# Patient Record
Sex: Male | Born: 1945 | Race: White | Hispanic: No | Marital: Married | Smoking: Never smoker
Health system: Southern US, Community
[De-identification: ages and names within clinical notes are randomized; demographics above are authoritative.]

## PROBLEM LIST (undated history)

## (undated) DIAGNOSIS — R7881 Bacteremia: Secondary | ICD-10-CM

## (undated) DIAGNOSIS — C439 Malignant melanoma of skin, unspecified: Secondary | ICD-10-CM

## (undated) DIAGNOSIS — C4490 Unspecified malignant neoplasm of skin, unspecified: Secondary | ICD-10-CM

## (undated) DIAGNOSIS — N10 Acute tubulo-interstitial nephritis: Secondary | ICD-10-CM

## (undated) DIAGNOSIS — G4730 Sleep apnea, unspecified: Secondary | ICD-10-CM

## (undated) DIAGNOSIS — N200 Calculus of kidney: Secondary | ICD-10-CM

## (undated) DIAGNOSIS — I219 Acute myocardial infarction, unspecified: Secondary | ICD-10-CM

## (undated) DIAGNOSIS — I10 Essential (primary) hypertension: Secondary | ICD-10-CM

## (undated) DIAGNOSIS — S52021A Displaced fracture of olecranon process without intraarticular extension of right ulna, initial encounter for closed fracture: Secondary | ICD-10-CM

## (undated) DIAGNOSIS — I2510 Atherosclerotic heart disease of native coronary artery without angina pectoris: Secondary | ICD-10-CM

## (undated) DIAGNOSIS — S129XXA Fracture of neck, unspecified, initial encounter: Secondary | ICD-10-CM

## (undated) DIAGNOSIS — K118 Other diseases of salivary glands: Secondary | ICD-10-CM

## (undated) DIAGNOSIS — I480 Paroxysmal atrial fibrillation: Secondary | ICD-10-CM

## (undated) DIAGNOSIS — M1990 Unspecified osteoarthritis, unspecified site: Secondary | ICD-10-CM

## (undated) DIAGNOSIS — I214 Non-ST elevation (NSTEMI) myocardial infarction: Secondary | ICD-10-CM

## (undated) DIAGNOSIS — R569 Unspecified convulsions: Secondary | ICD-10-CM

## (undated) DIAGNOSIS — W19XXXA Unspecified fall, initial encounter: Secondary | ICD-10-CM

## (undated) HISTORY — PX: SKIN CANCER EXCISION: SHX779

## (undated) HISTORY — DX: Unspecified fall, initial encounter: W19.XXXA

## (undated) HISTORY — DX: Malignant melanoma of skin, unspecified: C43.9

## (undated) HISTORY — DX: Non-ST elevation (NSTEMI) myocardial infarction: I21.4

## (undated) HISTORY — DX: Atherosclerotic heart disease of native coronary artery without angina pectoris: I25.10

## (undated) HISTORY — PX: CORONARY ANGIOPLASTY WITH STENT PLACEMENT: SHX49

## (undated) HISTORY — DX: Paroxysmal atrial fibrillation: I48.0

---

## 1999-08-22 ENCOUNTER — Ambulatory Visit (HOSPITAL_COMMUNITY): Admission: RE | Admit: 1999-08-22 | Discharge: 1999-08-22 | Payer: Self-pay | Admitting: Cardiovascular Disease

## 2000-01-06 ENCOUNTER — Encounter: Payer: Self-pay | Admitting: Family Medicine

## 2000-01-06 ENCOUNTER — Ambulatory Visit (HOSPITAL_COMMUNITY): Admission: RE | Admit: 2000-01-06 | Discharge: 2000-01-06 | Payer: Self-pay | Admitting: Family Medicine

## 2000-01-06 ENCOUNTER — Encounter: Admission: RE | Admit: 2000-01-06 | Discharge: 2000-02-26 | Payer: Self-pay | Admitting: Family Medicine

## 2005-06-27 ENCOUNTER — Emergency Department (HOSPITAL_COMMUNITY): Admission: EM | Admit: 2005-06-27 | Discharge: 2005-06-27 | Payer: Self-pay | Admitting: Emergency Medicine

## 2005-07-15 ENCOUNTER — Ambulatory Visit (HOSPITAL_COMMUNITY): Admission: RE | Admit: 2005-07-15 | Discharge: 2005-07-15 | Payer: Self-pay | Admitting: Family Medicine

## 2005-11-10 ENCOUNTER — Encounter: Admission: RE | Admit: 2005-11-10 | Discharge: 2006-02-08 | Payer: Self-pay | Admitting: Family Medicine

## 2006-03-25 ENCOUNTER — Encounter: Admission: RE | Admit: 2006-03-25 | Discharge: 2006-03-25 | Payer: Self-pay | Admitting: Orthopedic Surgery

## 2006-08-25 ENCOUNTER — Encounter: Admission: RE | Admit: 2006-08-25 | Discharge: 2006-08-25 | Payer: Self-pay | Admitting: Family Medicine

## 2008-06-28 ENCOUNTER — Inpatient Hospital Stay (HOSPITAL_COMMUNITY): Admission: EM | Admit: 2008-06-28 | Discharge: 2008-06-29 | Payer: Self-pay | Admitting: Emergency Medicine

## 2008-09-06 ENCOUNTER — Ambulatory Visit (HOSPITAL_COMMUNITY): Admission: RE | Admit: 2008-09-06 | Discharge: 2008-09-06 | Payer: Self-pay | Admitting: Neurology

## 2008-10-12 ENCOUNTER — Emergency Department (HOSPITAL_COMMUNITY): Admission: EM | Admit: 2008-10-12 | Discharge: 2008-10-12 | Payer: Self-pay | Admitting: Emergency Medicine

## 2009-09-25 ENCOUNTER — Emergency Department (HOSPITAL_COMMUNITY): Admission: EM | Admit: 2009-09-25 | Discharge: 2009-09-25 | Payer: Self-pay | Admitting: Emergency Medicine

## 2010-11-23 LAB — URINALYSIS, ROUTINE W REFLEX MICROSCOPIC
Bilirubin Urine: NEGATIVE
Glucose, UA: NEGATIVE mg/dL
Hgb urine dipstick: NEGATIVE
Ketones, ur: NEGATIVE mg/dL
Protein, ur: NEGATIVE mg/dL

## 2010-11-23 LAB — DIFFERENTIAL
Basophils Relative: 1 % (ref 0–1)
Eosinophils Absolute: 0.2 10*3/uL (ref 0.0–0.7)
Eosinophils Relative: 3 % (ref 0–5)
Lymphs Abs: 1.1 10*3/uL (ref 0.7–4.0)
Monocytes Relative: 10 % (ref 3–12)

## 2010-11-23 LAB — BASIC METABOLIC PANEL
CO2: 30 mEq/L (ref 19–32)
Chloride: 106 mEq/L (ref 96–112)
Creatinine, Ser: 1.08 mg/dL (ref 0.4–1.5)
Glucose, Bld: 112 mg/dL — ABNORMAL HIGH (ref 70–99)
Sodium: 142 mEq/L (ref 135–145)

## 2010-11-23 LAB — CBC
HCT: 35.5 % — ABNORMAL LOW (ref 39.0–52.0)
MCHC: 35.1 g/dL (ref 30.0–36.0)
MCV: 92.3 fL (ref 78.0–100.0)
RBC: 3.85 MIL/uL — ABNORMAL LOW (ref 4.22–5.81)
WBC: 7.4 10*3/uL (ref 4.0–10.5)

## 2010-12-23 LAB — DIFFERENTIAL
Eosinophils Relative: 3 % (ref 0–5)
Lymphocytes Relative: 16 % (ref 12–46)
Lymphs Abs: 1.1 10*3/uL (ref 0.7–4.0)
Neutrophils Relative %: 73 % (ref 43–77)

## 2010-12-23 LAB — CBC
MCHC: 34.7 g/dL (ref 30.0–36.0)
MCV: 90.2 fL (ref 78.0–100.0)
RBC: 4.32 MIL/uL (ref 4.22–5.81)
RDW: 14 % (ref 11.5–15.5)

## 2010-12-23 LAB — COMPREHENSIVE METABOLIC PANEL
ALT: 16 U/L (ref 0–53)
Calcium: 8.9 mg/dL (ref 8.4–10.5)
Glucose, Bld: 115 mg/dL — ABNORMAL HIGH (ref 70–99)
Sodium: 140 mEq/L (ref 135–145)
Total Protein: 6.3 g/dL (ref 6.0–8.3)

## 2010-12-23 LAB — RAPID URINE DRUG SCREEN, HOSP PERFORMED
Amphetamines: NOT DETECTED
Benzodiazepines: NOT DETECTED
Cocaine: NOT DETECTED
Opiates: NOT DETECTED
Tetrahydrocannabinol: NOT DETECTED

## 2011-01-20 NOTE — Discharge Summary (Signed)
Mark [name], Mark [name]                ACCOUNT NO.:  [id number]   MEDICAL RECORD NO.:  [id number]          PATIENT TYPE:  INP   LOCATION:  A321                          FACILITY:  [facility]   PHYSICIAN:  Osvaldo Shipper, MD     DATE OF BIRTH:  [birth year]   DATE OF ADMISSION:  06/27/2008  DATE OF DISCHARGE:  10/23/2009LH                               DISCHARGE SUMMARY   PMD:  Dr. Renaye Rakers in [place], [place].   DISCHARGE DIAGNOSES:  1. Acute gouty inflammation of the left foot, improved.  2. History of hypertension.  3. History of dyslipidemia.  4. Possible superimposed cellulitis requiring a brief course of      antibiotics.  5. Chronic gait impairment, requiring outpatient neurologic      evaluation, defer to primary medical doctor.   BRIEF HOSPITAL COURSE:  Briefly, this is a 65 year old Caucasian male  who presented with complaints of pain in his left foot area with redness  and swelling.  The patient has a history of gout, and this was thought  to be a gouty inflammation; however, he also had an elevated white count  and low grade fever, so consideration was also given to superimposed  cellulitis.  Patient was started on Unasyn, colchicine, and patient is  significantly improved.  His white count is down to normal.  He is  afebrile.  He is feeling better.  His inflammation is subsided.  The  redness is decreased significantly.  He was also seen by physical  therapy, and he was able to ambulate with a walker.  He does have a  walker at home, and home-health PT will be set up for him.   I have asked him to discuss with Dr. Parke Simmers regarding initiation of  allopurinol, as this is his third episode of gout this year.  He may  also be a candidate for prophylactic dose of colchicine, but again, this  would be deferred to Dr. Parke Simmers.   His other medical issues remain stable.  The patient and his wife were  mentioning that patient was having difficulty with his gait for the last  six months or so.  A CT head was done which showed prominent ventricles  but no overt hydrocephalus.  He does mention some tremors occasionally.  It is likely the patient could have Parkinson's.  I have told him to  discuss this with Dr. Parke Simmers so that neurological referral may be made as  needed.  Ankle films were negative for any acute bony injury.   On the day of discharge, he is feeling better.  He was able to ambulate  yesterday with a walker.  Vital signs were all stable.  Examination  reveals his left foot is actually is actually much improved.  Redness  has decreased significantly.  Swelling is also down.   Overall, he is stable for discharge.   DISCHARGE MEDICATIONS:  1. Augmentin 875 one capsule twice daily for five more days.  2. Colchicine 0.6 mg 3 times daily for five days.  3. Motrin 400 mg p.o. t.i.d. p.r.n. pain.  4. Zantac  150 mg p.o. b.i.d. while on Motrin.  5. Benicar/HCT 40/25 once daily.  6. Lovaza 1 gm once daily.  7. Lipitor 10 mg once daily.  8. Norvasc 10 mg once daily.  9. Aspirin 81 mg once daily.   Follow up with Dr. Parke Simmers in one week.   Dr. Parke Simmers to decide regarding referral to neurology after discussing  with patient.   DIET:  Heart healthy.   PHYICAL ACTIVITY:  With a walker.  Home health PT will be set up.   No consultations obtained during this admission.   Total time on this discharge summary was about 35 minutes.      Osvaldo Shipper, MD  Electronically Signed     GK/MEDQ  D:  06/29/2008  T:  06/29/2008  Job:  [id number]   cc:   Renaye Rakers, M.D.  Fax: [phone]

## 2011-01-20 NOTE — H&P (Signed)
Mark [name], Mark [name]                ACCOUNT NO.:  [id number]   MEDICAL RECORD NO.:  [id number]          PATIENT TYPE:  INP   LOCATION:  IC05                          FACILITY:  [facility]   PHYSICIAN:  Osvaldo Shipper, MD     DATE OF BIRTH:  15-Nov-1945   DATE OF ADMISSION:  06/28/2008  DATE OF DISCHARGE:  LH                              HISTORY & PHYSICAL   PRIMARY CARE PHYSICIAN:  Renaye Rakers, M.D.   ADMITTING DIAGNOSES:  1. Acute gout inflammation of the left foot.  2. Possible superimposed cellulitis.  3. History of hypertension.  4. History of dyslipidemia.   CHIEF COMPLAINT:  Left foot pain and fall.   HISTORY OF PRESENT ILLNESS:  The patient is a 65 year old Caucasian male  who has a history of gout, hypertension and dyslipidemia; who was in his  usual state of health until this past Sunday, when he started noticing  pain in his left foot area.  The foot slowly became red.  He also  noticed pain in the left knee area.  Over the last day and a half the  patient has been difficulty walking.  He has fallen a couple of times.  He denies any syncopal episodes however.  He denies any trauma as a  result of the fall.  He mentioned a history of fever, but has not  checked his temperature.  He denies any sick contacts.  He tells me that  he has recovered from flu-like symptoms which he had last week.  He  denies any nausea or vomiting.  He says his gait has been severely  impaired because of the inflammation and the pain.  According to his  wife, the patient's gait has been impaired over the last 6 months, but  they have not seen a neurologist nor mentioned this to his regular  doctor.   MEDICATIONS AT HOME:  1. Benicar-HCT unknown dose.  2. Aspirin 81 mg daily.  3. Lovaza unknown dose.  4. Lipitor unknown dose.  5. Norvasc unknown dose.   ALLERGIES:  NO KNOWN DRUG ALLERGIES .   PAST MEDICAL HISTORY:  1. Hypertension.  2. Dyslipidemia.  3. He mentions history of irregular heart  beat, but he does not know      full details regarding this condition.   PAST SURGICAL HISTORY:  None.   SOCIAL HISTORY:  He lives in [place] with his wife.  He is retired.  He does not smoke.  No alcohol consumption.  No illicit drug use.  He  was independent before onset of this inflammation.   FAMILY HISTORY:  Unremarkable .   REVIEW OF SYSTEMS:  GENERAL:  Positive for weakness.  HEENT:  Unremarkable.  CARDIOVASCULAR:  Unremarkable.  RESPIRATORY:  Unremarkable.  GI:  Unremarkable.  GU:  Unremarkable.  MUSCULOSKELETAL:  As in HPI.  NEUROLOGIC:  Unremarkable.  PSYCHIATRIC:  Unremarkable.  DERMATOLOGIC:  Unremarkable.  Other systems unremarkable.   PHYSICAL EXAMINATION:  VITAL SIGNS:  When he was in the emergency  department he had a temperature of 99, his heart rate in the 80s, sed  rate  14, blood pressure 103/64, saturation 96% on room air.  GENERAL EXAM:  This is a white male who is obese, in no distress.  HEENT:  There is no pallor, no icterus.  Oral mucous membranes are  moist.  No old lesions are noted.  NECK:  Soft and supple, no thyromegaly was appreciated.  CARDIOVASCULAR:  S1 and S2 is normal and regular.  No murmurs  appreciated.  No S2, S3 or S4.  No rubs or bruits.  No JVP is noted.  LUNGS:  Clear to auscultation bilaterally.  No wheezing, rales or  rhonchi.  No dullness to percussion.  ABDOMEN:  Soft, nontender and nondistended.  Bowel sounds are present.  No masses or organomegaly was appreciated.  EXTREMITIES:  Do not show any edema.  NEUROLOGIC:  He is alert and oriented x3.  No focal neurological  deficits are present.  GU:  Examination did not reveal any acute abnormalities.  DERMATOLOGIC:  He does have erythema over his left foot area,  encompassing his ankle as well.  He has some spots also over his left  knee.  MUSCULOSKELETAL:  Range of motion is diminished in the left ankle.  He  has tenderness over the metatarsal phalangeal joint, which is also   inflamed and swollen.  Tophi are also noted.   Gait was not assessed at this time.   LAB DATA:  White count 7.8, hemoglobin 12.5, platelet count 267,  differential was actually normal.  BMET was unremarkable.  Uric acid  level is 8.1 (elevated).  UA showed small bilirubin, specific gravity  was more than 1.030.  Blood cultures have been sent.   No admitting studies have been done.   ASSESSMENT:  This is a 65 year old Caucasian male with a past medical  history of hypertension, dyslipidemia and gout; who presents with pain  and swelling in the left foot area, and has fallen down a few times.  He  has evidence for acute gout inflammation.  He also has elevated white  count and low-grade fever.  He could have superimposed cellulitis as  well, though that is less likely.   PLAN:  1. Acute Gout Inflammation:  The patient will be put on anti-      inflammatory agents.  He will be given Colchicine.  Uric acid is      elevated.  Consideration to getting x-rays of the left foot will be      given if the patient has significant problems with ambulation.  PT      evaluation has been ordered.  The patient will benefit from      allopurinol, as this apparently is the third or fourth episode this      year so far.  2. Possible Cellulitis:  We will put him on Unasyn for now.  We will      recheck his white counts.  Will follow up on his blood cultures.  3. Hypertension:  Continue with his Benicar-HCT and his Norvasc.  4. History of Dyslipidemia:  Continue Lipitor as well.  5. History of Falls:  Likely as a result of the acute inflammation.      His wife mentions gait impairment over the last 6 months.  We will      check a CT of the head to make sure there is no normal pressure      hydrocephalus, and then will proceed from there.  Most likely he      will need an outpatient neurological evaluation,  which can be      arranged by his primary care physician.   PROPHYLAXIS:  Deep vein thrombosis  will be given.   CODE STATUS:  He is Full Code.      Osvaldo Shipper, MD  Electronically Signed     GK/MEDQ  D:  06/28/2008  T:  06/28/2008  Job:  [id number]   cc:   Renaye Rakers, M.D.  Fax: [phone]

## 2011-01-23 NOTE — Procedures (Signed)
EEG NUMBER:  [id number]   HISTORY OF PRESENT ILLNESS:  Mark [name] is a 65 year old patient who has  episodes of seizure-like activity with arm raising on both sides and urinary  incontinence.  First episode two weeks prior to this testing procedure.  This is a routine EEG.  No skull defects are noted.   MEDICATIONS:  1.  Lexapro.  2.  Lyrica.  3.  Benicar.  4.  Famvir.   EEG CLASSIFICATION:  Normal awake.   DESCRIPTION OF RECORDING:  This recording consisted of a fairly well  modulated medium amplitude alpha rhythm of 9 Hertz that is reactive to eye  opening and closing.  As the record progresses, the patient appears to  remain awakened state during the entirety of the recording.  Photic  stimulation is performed resulting in a bilateral and symmetrical flow  driving response.  Hyperventilation was also performed resulting in a  minimal buildup of background rhythm activities without significant swelling  seen.  At not time during the recording does there appear to be evidence of  spike or spike wave discharges or focal slowing.  EKG monitor shows no  evidence of cardiac rhythm abnormalities with heart rate of 66.   IMPRESSION:  This is a normal EEG recording in the awakened state.  No  evidence of ictal or inner ictal discharges are seen.      Marlan Palau, M.D.  Electronically Signed     GNF:AOZH  D:  07/15/2005 19:04:43  T:  07/16/2005 07:51:53  Job #:  [id number]

## 2011-02-17 ENCOUNTER — Encounter (HOSPITAL_COMMUNITY): Payer: Self-pay | Admitting: Radiology

## 2011-02-17 ENCOUNTER — Emergency Department (HOSPITAL_COMMUNITY)
Admission: EM | Admit: 2011-02-17 | Discharge: 2011-02-17 | Disposition: A | Discharge status: Home / Self Care | Payer: Medicare PPO | Attending: Emergency Medicine | Admitting: Emergency Medicine

## 2011-02-17 ENCOUNTER — Emergency Department (HOSPITAL_COMMUNITY): Payer: Medicare PPO

## 2011-02-17 DIAGNOSIS — Z8673 Personal history of transient ischemic attack (TIA), and cerebral infarction without residual deficits: Secondary | ICD-10-CM | POA: Insufficient documentation

## 2011-02-17 DIAGNOSIS — G20A1 Parkinson's disease without dyskinesia, without mention of fluctuations: Secondary | ICD-10-CM | POA: Insufficient documentation

## 2011-02-17 DIAGNOSIS — G20 Parkinson's disease: Secondary | ICD-10-CM | POA: Insufficient documentation

## 2011-02-17 DIAGNOSIS — I2510 Atherosclerotic heart disease of native coronary artery without angina pectoris: Secondary | ICD-10-CM | POA: Insufficient documentation

## 2011-02-17 DIAGNOSIS — M549 Dorsalgia, unspecified: Secondary | ICD-10-CM | POA: Insufficient documentation

## 2011-02-17 DIAGNOSIS — I10 Essential (primary) hypertension: Secondary | ICD-10-CM | POA: Insufficient documentation

## 2011-02-17 DIAGNOSIS — G40802 Other epilepsy, not intractable, without status epilepticus: Secondary | ICD-10-CM | POA: Insufficient documentation

## 2011-02-17 DIAGNOSIS — M109 Gout, unspecified: Secondary | ICD-10-CM | POA: Insufficient documentation

## 2011-02-17 DIAGNOSIS — G8929 Other chronic pain: Secondary | ICD-10-CM | POA: Insufficient documentation

## 2011-02-17 DIAGNOSIS — Z79899 Other long term (current) drug therapy: Secondary | ICD-10-CM | POA: Insufficient documentation

## 2011-02-17 HISTORY — DX: Essential (primary) hypertension: I10

## 2011-02-17 LAB — COMPREHENSIVE METABOLIC PANEL
ALT: 11 U/L (ref 0–53)
AST: 16 U/L (ref 0–37)
Albumin: 3.8 g/dL (ref 3.5–5.2)
Alkaline Phosphatase: 67 U/L (ref 39–117)
CO2: 27 mEq/L (ref 19–32)
Creatinine, Ser: 1.1 mg/dL (ref 0.4–1.5)
Glucose, Bld: 107 mg/dL — ABNORMAL HIGH (ref 70–99)
Total Bilirubin: 0.4 mg/dL (ref 0.3–1.2)
Total Protein: 7.1 g/dL (ref 6.0–8.3)

## 2011-02-17 LAB — DIFFERENTIAL
Lymphs Abs: 1.1 10*3/uL (ref 0.7–4.0)
Monocytes Relative: 8 % (ref 3–12)
Neutro Abs: 7 10*3/uL (ref 1.7–7.7)
Neutrophils Relative %: 78 % — ABNORMAL HIGH (ref 43–77)

## 2011-02-17 LAB — CBC
HCT: 40.3 % (ref 39.0–52.0)
Hemoglobin: 14.4 g/dL (ref 13.0–17.0)
MCH: 31.2 pg (ref 26.0–34.0)
MCV: 87.2 fL (ref 78.0–100.0)
RBC: 4.62 MIL/uL (ref 4.22–5.81)

## 2011-02-17 LAB — URINALYSIS, ROUTINE W REFLEX MICROSCOPIC
Glucose, UA: NEGATIVE mg/dL
Ketones, ur: NEGATIVE mg/dL
Leukocytes, UA: NEGATIVE
Protein, ur: 100 mg/dL — AB
pH: 5.5 (ref 5.0–8.0)

## 2011-02-17 LAB — URINE MICROSCOPIC-ADD ON

## 2011-06-09 LAB — CBC
Hemoglobin: 12.5 — ABNORMAL LOW
Platelets: 248
RBC: 4.03 — ABNORMAL LOW
RDW: 13.2
WBC: 11.8 — ABNORMAL HIGH
WBC: 7.8

## 2011-06-09 LAB — BASIC METABOLIC PANEL
BUN: 20
CO2: 27
Calcium: 8.2 — ABNORMAL LOW
Calcium: 8.6
Creatinine, Ser: 1.43
GFR calc non Af Amer: 50 — ABNORMAL LOW
GFR calc non Af Amer: >60
Potassium: 3.5
Potassium: 3.6
Sodium: 138

## 2011-06-09 LAB — DIFFERENTIAL
Basophils Absolute: 0
Lymphocytes Relative: 18
Lymphs Abs: 1.4
Lymphs Abs: 1.6
Monocytes Absolute: 1.7 — ABNORMAL HIGH
Monocytes Relative: 14 — ABNORMAL HIGH
Neutro Abs: 5.3
Neutro Abs: 8.4 — ABNORMAL HIGH
Neutrophils Relative %: 67
Neutrophils Relative %: 72

## 2011-06-09 LAB — CULTURE, BLOOD (ROUTINE X 2)
Culture: NO GROWTH
Report Status: 10272009
Report Status: 10272009

## 2011-06-09 LAB — URINALYSIS, ROUTINE W REFLEX MICROSCOPIC
Glucose, UA: NEGATIVE
Ketones, ur: 40 — AB
Protein, ur: NEGATIVE

## 2011-06-25 ENCOUNTER — Emergency Department (HOSPITAL_COMMUNITY)
Admission: EM | Admit: 2011-06-25 | Discharge: 2011-06-25 | Disposition: A | Discharge status: Still In Hospital | Payer: Medicare HMO | Source: Home / Self Care | Attending: Emergency Medicine | Admitting: Emergency Medicine

## 2011-06-25 ENCOUNTER — Inpatient Hospital Stay (HOSPITAL_COMMUNITY)
Admission: AD | Admit: 2011-06-25 | Discharge: 2011-06-27 | DRG: 282 | Disposition: A | Discharge status: Home / Self Care | Payer: Medicare HMO | Source: Other Acute Inpatient Hospital | Attending: Cardiovascular Disease | Admitting: Cardiovascular Disease

## 2011-06-25 DIAGNOSIS — M549 Dorsalgia, unspecified: Secondary | ICD-10-CM | POA: Insufficient documentation

## 2011-06-25 DIAGNOSIS — G20 Parkinson's disease: Secondary | ICD-10-CM | POA: Diagnosis present

## 2011-06-25 DIAGNOSIS — G20A1 Parkinson's disease without dyskinesia, without mention of fluctuations: Secondary | ICD-10-CM | POA: Diagnosis present

## 2011-06-25 DIAGNOSIS — Z79899 Other long term (current) drug therapy: Secondary | ICD-10-CM | POA: Insufficient documentation

## 2011-06-25 DIAGNOSIS — I10 Essential (primary) hypertension: Secondary | ICD-10-CM | POA: Insufficient documentation

## 2011-06-25 DIAGNOSIS — Z7902 Long term (current) use of antithrombotics/antiplatelets: Secondary | ICD-10-CM

## 2011-06-25 DIAGNOSIS — Z8673 Personal history of transient ischemic attack (TIA), and cerebral infarction without residual deficits: Secondary | ICD-10-CM

## 2011-06-25 DIAGNOSIS — M109 Gout, unspecified: Secondary | ICD-10-CM | POA: Insufficient documentation

## 2011-06-25 DIAGNOSIS — I4891 Unspecified atrial fibrillation: Secondary | ICD-10-CM | POA: Diagnosis present

## 2011-06-25 DIAGNOSIS — I214 Non-ST elevation (NSTEMI) myocardial infarction: Principal | ICD-10-CM | POA: Diagnosis present

## 2011-06-25 DIAGNOSIS — I2510 Atherosclerotic heart disease of native coronary artery without angina pectoris: Secondary | ICD-10-CM | POA: Diagnosis present

## 2011-06-25 DIAGNOSIS — G40802 Other epilepsy, not intractable, without status epilepticus: Secondary | ICD-10-CM | POA: Insufficient documentation

## 2011-06-25 DIAGNOSIS — R55 Syncope and collapse: Secondary | ICD-10-CM | POA: Insufficient documentation

## 2011-06-25 DIAGNOSIS — Z7982 Long term (current) use of aspirin: Secondary | ICD-10-CM

## 2011-06-25 DIAGNOSIS — Z9861 Coronary angioplasty status: Secondary | ICD-10-CM

## 2011-06-25 LAB — POCT I-STAT TROPONIN I: Troponin i, poc: 2.75 ng/mL (ref 0.00–0.08)

## 2011-06-25 LAB — CARDIAC PANEL(CRET KIN+CKTOT+MB+TROPI): Relative Index: 5 — ABNORMAL HIGH (ref 0.0–2.5)

## 2011-06-25 LAB — DIFFERENTIAL
Basophils Absolute: 0 10*3/uL (ref 0.0–0.1)
Basophils Relative: 0 % (ref 0–1)
Eosinophils Absolute: 0 10*3/uL (ref 0.0–0.7)
Monocytes Absolute: 0.7 10*3/uL (ref 0.1–1.0)
Monocytes Relative: 8 % (ref 3–12)
Neutro Abs: 6.6 10*3/uL (ref 1.7–7.7)
Neutrophils Relative %: 78 % — ABNORMAL HIGH (ref 43–77)

## 2011-06-25 LAB — MRSA PCR SCREENING: MRSA by PCR: NEGATIVE

## 2011-06-25 LAB — URINALYSIS, ROUTINE W REFLEX MICROSCOPIC
Glucose, UA: NEGATIVE mg/dL
Leukocytes, UA: NEGATIVE
Nitrite: NEGATIVE
pH: 5.5 (ref 5.0–8.0)

## 2011-06-25 LAB — POCT I-STAT, CHEM 8
BUN: 17 mg/dL (ref 6–23)
Calcium, Ion: 1.17 mmol/L (ref 1.12–1.32)
Chloride: 107 mEq/L (ref 96–112)
HCT: 34 % — ABNORMAL LOW (ref 39.0–52.0)
Sodium: 142 mEq/L (ref 135–145)
TCO2: 23 mmol/L (ref 0–100)

## 2011-06-25 LAB — CK TOTAL AND CKMB (NOT AT ARMC)
CK, MB: 29.8 ng/mL (ref 0.3–4.0)
Total CK: 462 U/L — ABNORMAL HIGH (ref 7–232)

## 2011-06-25 LAB — CBC
Hemoglobin: 12.6 g/dL — ABNORMAL LOW (ref 13.0–17.0)
MCH: 30.3 pg (ref 26.0–34.0)
MCHC: 34.5 g/dL (ref 30.0–36.0)
Platelets: 270 10*3/uL (ref 150–400)

## 2011-06-26 DIAGNOSIS — I517 Cardiomegaly: Secondary | ICD-10-CM

## 2011-06-26 HISTORY — PX: TRANSTHORACIC ECHOCARDIOGRAM: SHX275

## 2011-06-26 HISTORY — PX: CARDIAC CATHETERIZATION: SHX172

## 2011-06-26 LAB — CBC
HCT: 31.5 % — ABNORMAL LOW (ref 39.0–52.0)
Hemoglobin: 11 g/dL — ABNORMAL LOW (ref 13.0–17.0)
MCHC: 34.9 g/dL (ref 30.0–36.0)
RBC: 3.54 MIL/uL — ABNORMAL LOW (ref 4.22–5.81)
WBC: 7 10*3/uL (ref 4.0–10.5)

## 2011-06-26 LAB — COMPREHENSIVE METABOLIC PANEL
ALT: 13 U/L (ref 0–53)
Alkaline Phosphatase: 42 U/L (ref 39–117)
BUN: 21 mg/dL (ref 6–23)
CO2: 25 mEq/L (ref 19–32)
Chloride: 107 mEq/L (ref 96–112)
GFR calc Af Amer: 61 mL/min — ABNORMAL LOW (ref >90)
Glucose, Bld: 90 mg/dL (ref 70–99)
Potassium: 3.8 mEq/L (ref 3.5–5.1)
Sodium: 140 mEq/L (ref 135–145)
Total Bilirubin: 0.4 mg/dL (ref 0.3–1.2)
Total Protein: 5.5 g/dL — ABNORMAL LOW (ref 6.0–8.3)

## 2011-06-26 LAB — LIPID PANEL: Total CHOL/HDL Ratio: 3.4 RATIO

## 2011-06-26 LAB — HEMOGLOBIN A1C: Hgb A1c MFr Bld: 5.5 % (ref <5.7)

## 2011-06-26 LAB — CARDIAC PANEL(CRET KIN+CKTOT+MB+TROPI)
CK, MB: 25.1 ng/mL (ref 0.3–4.0)
Total CK: 723 U/L — ABNORMAL HIGH (ref 7–232)
Troponin I: 13.19 ng/mL (ref <0.30)

## 2011-07-02 NOTE — Discharge Summary (Signed)
  NAMETAYDON, Mark [name] NO.:  [id number]  MEDICAL RECORD NO.:  [id number]  LOCATION:                                 FACILITY:  PHYSICIAN:  Vesta Mixer, M.D. DATE OF BIRTH:  June 17, 1946  DATE OF ADMISSION: DATE OF DISCHARGE:                              DISCHARGE SUMMARY   PRIMARY CARDIOLOGIST:  Vesta Mixer, MD  DISCHARGE DIAGNOSIS: 1. Severe 3-vessel coronary artery disease.     a.     Status post non-ST elevation myocardial infarction, treated      medically.     b.     Preserved left ventricular function.  SECONDARY DIAGNOSES: 1. Hypertension. 2. Paroxysmal atrial fibrillation. 3. History of stroke. 4. Parkinson disease.  REASON FOR ADMISSION:  Mr. Bero is a 66 year old male, with reported history of CAD, status post prior stenting in 2000, who initially presented to [facility] ED with altered mental status and weakness. Serial cardiac markers, however, were abnormal, and he was transferred directly to the cardiac catheterization lab.  HOSPITAL COURSE:  The patient underwent coronary angiography, by Dr. Leodis Sias, which revealed severe 3-vessel CAD with normal LV function, and no focal wall motion abnormalities.  Dr. Elease Hashimoto noted that the patient was completely pain free, and, therefore, recommended aggressive medical management.  He also felt that the patient was not a good surgical candidate.  The patient was subsequently placed on aggressive medical management, including carvedilol and statins.  Plavix was subsequently added, as well,  followed by down titration of carvedilol, secondary to borderline hypotension.  The patient was cleared for discharge on hospital day #2, in hemodynamically  stable condition, with no complaint of chest pain.  DISCHARGE LABORATORY DATA:  None.  OUTSTANDING LABORATORY DATA:  Peak CPK 796/40; peak troponin I 17.9. Lipid Profile:  Total cholesterol 135, triglycerides 64, HDL 40 and LDL 82.  TSH  1.2.  Urinalysis negative for nitrites/leukocytes.  Sodium 140, potassium 3.8, BUN 21, creatinine 1.4, pre discharge hemoglobin A1c 5.5. WBC 7, hemoglobin 11, hematocrit 32 (MCV 89) and platelet 211.  PREDISCHARGE DISPOSITION:  Stable.  FOLLOWUP:  Dr. Marilynne Drivers, NP in 2 weeks, arrangements to be made through our office.  DISCHARGE MEDICATIONS: 1. Aspirin 81 mg daily. 2. Plavix 75 mg daily. 3. Carvedilol 3.125 mg b.i.d. 4. Crestor 20 mg daily. 5. Nitrostat 0.4 mg p.r.n.  DURATION OF DISCHARGE ENCOUNTER:  Greater than 30 minutes, including physician time.     Gene Serpe, PA-C   ______________________________ Vesta Mixer, M.D.    GS/MEDQ  D:  06/27/2011  T:  06/27/2011  Job:  [id number]  Electronically Signed by Rozell Searing PA-C on 06/29/2011 10:57:51 AM Electronically Signed by Kristeen Miss M.D. on 07/02/2011 05:40:37 AM

## 2011-07-02 NOTE — Cardiovascular Report (Signed)
Mark [name], BERTSCH NO.:  [id number]  MEDICAL RECORD NO.:  [id number]  LOCATION:  2926                         FACILITY:  [facility]  PHYSICIAN:  Vesta Mixer, M.D. DATE OF BIRTH:  03-Apr-1946  DATE OF PROCEDURE:  06/25/2011 DATE OF DISCHARGE:                           CARDIAC CATHETERIZATION   HISTORY:  Mr. Vanduyne is a 65 year old gentleman with a history of severe Parkinson disease and a history of hypertension.  He had some altered mental status and presented to [facility].  He had an episode of near syncope.  He was seen at [facility] Room and did not have any specific complaints.  He denies any headache or dizziness or fevers or chills.  He has difficulty walking because of the severe Parkinson disease.  The true cardiac enzymes and his troponin level was found to be 2.75. The 2nd troponin level was found to be greater than 5.  The patient had a normal EKG.  Because of his troponin level, because of his inability to provide much history, we transferred him to [facility] for further evaluation and cardiac catheterization.  The patient arrived pain free.  He was not having any acute distress.  PROCEDURES:  Left heart catheterization with coronary angiography.  The right femoral artery was easily cannulated using modified Seldinger technique.  HEMODYNAMICS:  The LV pressure is 108/22.  The aortic pressure is 105/62.  Angiography: Left main:  The left main is normal.  The left anterior descending artery is normal in the proximal segment. It gives off 2 diagonal branches that come off almost at the same site. It is then occluded following these diagonal branches.  The distal LAD fills via left-to-left and right-to-left collaterals.  The first diagonal artery has a 50% stenosis at the takeoff.  The 2nd diagonal artery has minor luminal irregularities.  The left circumflex artery is a large vessel.  It gives off a very  high obtuse marginal artery, which is fairly normal.  The 2nd obtuse marginal artery is smaller and has minor luminal irregularities.  The continuation branch of the left circumflex artery is subtotally occluded and then reconstitutes.  It is then abruptly occluded consistent with a thrombus.  I suspect that this is our culprit lesion.  I suspect that it occluded his left circumflex artery and then embolized thrombus further distally.  The right coronary artery is large and dominant.  There is severe disease in the proximal region.  There is an eccentric 80% stenosis. Following this, there is a 50-60% stenosis in the mid vessel.  The distal vessel has only minor luminal irregularities.  The posterior descending artery and posterolateral segment artery have minor luminal irregularities.  The left ventriculogram was performed in the 30 RAO position, reveals normal left ventricular systolic function.  There are no segmental wall motion abnormalities.  COMPLICATIONS:  None.  CONCLUSIONS: 1. Severe three-vessel coronary artery disease. 2. Well-preserved left ventricular systolic function. 3. The patient is completely pain free and has not had any symptoms.     I think our best course would be for aggressive medical therapy.  I     do not think that he  is a good surgical candidate. 4. We will measure his cholesterol levels  in the morning and be very     aggressive with cholesterol control.  We will continue with his     current medications.  We will try to get in touch with Dr. Parke Simmers.     I have already made several attempts today, but her line is busy.     Vesta Mixer, M.D.     PJN/MEDQ  D:  06/25/2011  T:  06/26/2011  Job:  [id number]  cc:   Renaye Rakers, M.D.  Electronically Signed by Kristeen Miss M.D. on 07/02/2011 05:40:30 AM

## 2011-07-13 ENCOUNTER — Encounter: Payer: Self-pay | Admitting: Nurse Practitioner

## 2011-07-13 ENCOUNTER — Ambulatory Visit: Payer: Medicare HMO | Admitting: Nurse Practitioner

## 2011-07-14 ENCOUNTER — Encounter: Payer: Self-pay | Admitting: Nurse Practitioner

## 2011-07-14 ENCOUNTER — Ambulatory Visit (INDEPENDENT_AMBULATORY_CARE_PROVIDER_SITE_OTHER): Payer: Medicare HMO | Admitting: Nurse Practitioner

## 2011-07-14 VITALS — BP 130/80 | HR 64 | Ht 72.0 in | Wt 233.1 lb

## 2011-07-14 DIAGNOSIS — G20A1 Parkinson's disease without dyskinesia, without mention of fluctuations: Secondary | ICD-10-CM

## 2011-07-14 DIAGNOSIS — I2510 Atherosclerotic heart disease of native coronary artery without angina pectoris: Secondary | ICD-10-CM | POA: Insufficient documentation

## 2011-07-14 DIAGNOSIS — G20 Parkinson's disease: Secondary | ICD-10-CM

## 2011-07-14 DIAGNOSIS — I10 Essential (primary) hypertension: Secondary | ICD-10-CM | POA: Insufficient documentation

## 2011-07-14 NOTE — Assessment & Plan Note (Signed)
He is s/p cath. He has severe 3 vessel disease with well preserved EF. He is felt to best be managed medically. I have left him on his current medicines. We will see him back in one month. He has NTG on hand. Patient and his wife are agreeable to this plan and will call if any problems develop in the interim.

## 2011-07-14 NOTE — Progress Notes (Signed)
    Mark [name] Date of Birth: 10/20/1945 Medical Record #[id number]  History of Present Illness: Mr. Kumpf is seen back today for a post hospital visit. He is seen for Dr. Elease Hashimoto. He has had a recent NSTEMI. He initially presented with altered mental status. He has well preserved LV function. He has severe 3 vessel disease. He was felt to best be managed medically. He was not felt to be a good surgical candidate. I would presume this is due to his Parkinson's. He doesn't really have much tremor but has lots of mobility issues and difficulty with his ADL's.   Since he has been home, he has done well. No chest pain. He does not check his blood pressure. He is tolerating his medicines. He will see his PCP later this month. His wife says he is doing ok, just very slow.   Current Outpatient Prescriptions on File Prior to Visit  Medication Sig Dispense Refill  . aspirin 81 MG tablet Take 81 mg by mouth daily.        . carvedilol (COREG) 3.125 MG tablet Take 3.125 mg by mouth 2 (two) times daily with a meal.        . clopidogrel (PLAVIX) 75 MG tablet Take 75 mg by mouth daily.        . nitroGLYCERIN (NITROSTAT) 0.4 MG SL tablet Place 0.4 mg under the tongue every 5 (five) minutes as needed.        . rosuvastatin (CRESTOR) 20 MG tablet Take 20 mg by mouth daily.          No Known Allergies  Past Medical History  Diagnosis Date  . Hypertension   . PAF (paroxysmal atrial fibrillation)   . Parkinson's disease   . Stroke   . Gout   . Non-ST elevated myocardial infarction (non-STEMI)   . Coronary artery disease     SEVERE 3-VESSEL    Past Surgical History  Procedure Date  . Coronary stent placement 2000  . Cardiac catheterization 06/26/2011    NORMAL LV SYSTOLIC FUNCTION. RCA IS LARGE AND DOMINANT  . Transthoracic echocardiogram 06/26/2011    EF 55-60%    History  Smoking status  . Never Smoker   Smokeless tobacco  . Not on file    History  Alcohol Use No    No family  history on file.  Review of Systems: The review of systems is per the HPI.  All other systems were reviewed and are negative.  Physical Exam: Ht 6' (1.829 m)  Wt 233 lb 1.9 oz (105.743 kg)  BMI 31.62 kg/m2 Patient is very pleasant and in no acute distress. Seems a little weak. Skin is warm and dry. Color is normal.  HEENT is unremarkable. Normocephalic/atraumatic. PERRL. Sclera are nonicteric. Neck is supple. No masses. No JVD. Lungs are [name]. Cardiac exam shows a regular rate and rhythm. Abdomen is soft. Extremities are without edema. Gait and ROM are intact. No gross neurologic deficits noted.   LABORATORY DATA:   Assessment / Plan:

## 2011-07-14 NOTE — Patient Instructions (Addendum)
Stay on your current medicines.   Use your NTG under your tongue for recurrent chest pain. May take one tablet every 5 minutes. If you are still having discomfort after 3 tablets in 15 minutes, call 911.  We will see you back in one month.   Call for any problems.

## 2011-07-14 NOTE — Assessment & Plan Note (Signed)
This seems to be his primary issue. He is currently not on any parkinson medicines but will be seeing neurology in [place] in December.

## 2011-07-14 NOTE — Assessment & Plan Note (Signed)
I rechecked his blood pressure and it was down to 130/80. I have left him on his current regimen.

## 2011-07-22 NOTE — H&P (Signed)
NAMEJAKEIM, SEDORE NO.:  [id number]  MEDICAL RECORD NO.:  [id number]  LOCATION:  2926                         FACILITY:  [facility]  PHYSICIAN:  Vesta Mixer, M.D. DATE OF BIRTH:  1946-02-21  DATE OF ADMISSION:  06/25/2011 DATE OF DISCHARGE:                             HISTORY & PHYSICAL   PRIMARY CARE PHYSICIAN:  Renaye Rakers, MD  PRIMARY CARDIOLOGIST:  None.  CHIEF COMPLAINT:  Non ST-segment elevation MI.  HISTORY OF PRESENT ILLNESS:  Mr. Bollig is a 65 year old male with an unclear history of coronary artery disease.  He went to the emergency room at [facility] today with altered mental status and weakness.  When his cardiac enzymes were elevated and his EKG was abnormal, he was brought to [facility] and taken directly to the cath lab.  The interview and exam were brief and much of the information that follows was obtained from records and family.  Mr. Vensel was weak upon waking this morning.  He did not have any chest pain although he has generalized aches and pains.  At baseline, he ambulates poorly because of his Parkinson disease.  He was also felt to have some mental status changes and decreased alertness.  He is not currently complaining of chest pain or shortness of breath.  PAST MEDICAL HISTORY: 1. Hypertension. 2. History of paroxysmal atrial fibrillation. 3. Parkinson disease. 4. History of CVA. 5. Gout. 6. Reported history of CAD and stent with no further details     available, possibly in December 2000.  SURGICAL HISTORY:  None known.  ALLERGIES:  No known drug allergies.  CURRENT MEDICATIONS: 1. Aspirin 325 mg. 2. Morphine 4 mg. 3. Nitroglycerin ointment while at [facility] Emergency Room.  HOME MEDICATIONS:  Unclear at this time.  SOCIAL HISTORY:  [place].  He is retired.  He has no documented history of alcohol, tobacco, or drug abuse.  FAMILY HISTORY:   Unobtainable.  REVIEW OF SYSTEMS:  Unobtainable except as stated in the HPI.  PHYSICAL EXAMINATION:  VITAL SIGNS:  Temperature 98.5, initial blood pressure 162/86, last blood pressure 124/70, heart rate 71, respiratory rate 18, O2 saturation 96% on room air. GENERAL:  He is a well-developed, well-nourished white male, in no acute distress at rest on O2. HEENT:  Normal for age. NECK:  There is no lymphadenopathy, thyromegaly, bruit, or JVD noted. CARDIOVASCULAR:  His heart is regular rate and rhythm with no clinically significant murmur, rub, or gallop is noted.  Distal pulses are intact in all 4 extremities.  No femoral bruits appreciated. LUNGS:  Essentially clear to auscultation bilaterally. SKIN:  No rashes or lesions are noted. ABDOMEN:  Soft and nontender with active bowel sounds and no hepatosplenomegaly by palpation. EXTREMITIES:  There is no cyanosis, clubbing, or edema noted. MUSCULOSKELETAL:  There is no joint deformity or effusions. NEUROLOGIC:  He is alert and oriented x1 to name with cranial nerves II- XII grossly intact.  The patient moves all 4 extremities.  Chest x-ray, cardiac enlargement, no acute disease.  EKG sinus rhythm, rate 75 with normal axis and intervals.  LABORATORY VALUES:  Sodium 142, potassium 3.7  chloride 107, BUN 17, creatinine 1.2, glucose 101.  Initial point-of-care troponin 2.75. Hemoglobin 12.6, hematocrit 36.5, WBCs 8.6, platelets 270.  CK-MB 462/29.8 with a troponin I of 5.5.  IMPRESSION:  Mr. Lamp was seen today by Dr. Elease Hashimoto, the patient evaluated and the data reviewed.  Dr. Elease Hashimoto felt that with the increase in his cardiac enzymes, catheterization is indicated and will be done immediately.  He will be monitored carefully, continued on his home medications and screened for cardiac risk factors.  The patient's daughter was contacted and is aware.     Theodore Demark, PA-C   ______________________________ Vesta Mixer,  M.D.    RB/MEDQ  D:  06/25/2011  T:  06/26/2011  Job:  [id number]  Electronically Signed by Theodore Demark PA-C on 07/10/2011 11:56:03 PM Electronically Signed by Kristeen Miss M.D. on 07/22/2011 09:47:29 AM

## 2011-08-08 ENCOUNTER — Emergency Department (HOSPITAL_COMMUNITY)
Admission: EM | Admit: 2011-08-08 | Discharge: 2011-08-09 | Disposition: A | Discharge status: Home / Self Care | Payer: Medicare HMO | Attending: Emergency Medicine | Admitting: Emergency Medicine

## 2011-08-08 ENCOUNTER — Encounter (HOSPITAL_COMMUNITY): Payer: Self-pay

## 2011-08-08 ENCOUNTER — Emergency Department (HOSPITAL_COMMUNITY): Payer: Medicare HMO

## 2011-08-08 DIAGNOSIS — M542 Cervicalgia: Secondary | ICD-10-CM | POA: Insufficient documentation

## 2011-08-08 DIAGNOSIS — S0003XA Contusion of scalp, initial encounter: Secondary | ICD-10-CM | POA: Insufficient documentation

## 2011-08-08 DIAGNOSIS — I4891 Unspecified atrial fibrillation: Secondary | ICD-10-CM | POA: Insufficient documentation

## 2011-08-08 DIAGNOSIS — S0990XA Unspecified injury of head, initial encounter: Secondary | ICD-10-CM | POA: Insufficient documentation

## 2011-08-08 DIAGNOSIS — T07XXXA Unspecified multiple injuries, initial encounter: Secondary | ICD-10-CM

## 2011-08-08 DIAGNOSIS — IMO0002 Reserved for concepts with insufficient information to code with codable children: Secondary | ICD-10-CM | POA: Insufficient documentation

## 2011-08-08 DIAGNOSIS — Z7982 Long term (current) use of aspirin: Secondary | ICD-10-CM | POA: Insufficient documentation

## 2011-08-08 DIAGNOSIS — G20 Parkinson's disease: Secondary | ICD-10-CM | POA: Insufficient documentation

## 2011-08-08 DIAGNOSIS — Y9229 Other specified public building as the place of occurrence of the external cause: Secondary | ICD-10-CM | POA: Insufficient documentation

## 2011-08-08 DIAGNOSIS — W1809XA Striking against other object with subsequent fall, initial encounter: Secondary | ICD-10-CM | POA: Insufficient documentation

## 2011-08-08 DIAGNOSIS — I252 Old myocardial infarction: Secondary | ICD-10-CM | POA: Insufficient documentation

## 2011-08-08 DIAGNOSIS — W19XXXA Unspecified fall, initial encounter: Secondary | ICD-10-CM

## 2011-08-08 DIAGNOSIS — I2510 Atherosclerotic heart disease of native coronary artery without angina pectoris: Secondary | ICD-10-CM | POA: Insufficient documentation

## 2011-08-08 DIAGNOSIS — G20A1 Parkinson's disease without dyskinesia, without mention of fluctuations: Secondary | ICD-10-CM | POA: Insufficient documentation

## 2011-08-08 DIAGNOSIS — Z8673 Personal history of transient ischemic attack (TIA), and cerebral infarction without residual deficits: Secondary | ICD-10-CM | POA: Insufficient documentation

## 2011-08-08 DIAGNOSIS — I219 Acute myocardial infarction, unspecified: Secondary | ICD-10-CM

## 2011-08-08 HISTORY — DX: Acute myocardial infarction, unspecified: I21.9

## 2011-08-08 NOTE — ED Provider Notes (Addendum)
History  This chart was scribed for Shelda Jakes, MD by Bennett Scrape. This patient was seen in room APA08/APA08 and the patient's care was started at 10:23PM.  CSN: [id number] Arrival date & time: 08/08/2011  8:20 PM   First MD Initiated Contact with Patient 08/08/11 2134      Chief Complaint  Patient presents with  . Fall  . Head Injury    Patient is a 65 y.o. male presenting with fall and head injury. The history is provided by a relative. No language interpreter was used.  Fall The accident occurred 3 to 5 hours ago. The fall occurred while standing. Impact surface: brick wall. The volume of blood lost was minimal. The point of impact was the head. The pain is present in the right wrist and left wrist. He was ambulatory at the scene. Pertinent negatives include no visual change, no fever, no nausea, no vomiting, no hematuria, no headaches and no loss of consciousness. The symptoms are aggravated by use of the injured limb. He has tried nothing for the symptoms.  Head Injury  Pertinent negatives include no vomiting and no weakness.   Lion Fernandez is a 65 y.o. male who presents to the Emergency Department complaining of a head injury after a fall with no LOC. Relative reports that pt has a h/o Parkinson's which makes him unsteady. Relative reports that pt was getting up out of a chair in a restaurant, lost his balance and fell into a brick wall. Pt hit the left side of his forehead and the backside of both of his hands.Relative states that pt was ambulatory with assistance after the fall, but also states that this is baseline for the pt. Pt is on Plavix and reports having a TD shot done 4 to 5 years ago.   Past Medical History  Diagnosis Date  . Hypertension   . PAF (paroxysmal atrial fibrillation)   . Parkinson's disease   . Stroke     Hx of remote stroke  . Gout   . Non-ST elevated myocardial infarction (non-STEMI)   . Coronary artery disease     SEVERE 3-VESSEL DX WITH  NORMAL EF    Past Surgical History  Procedure Date  . Coronary stent placement 2000  . Cardiac catheterization 06/26/2011    NORMAL LV SYSTOLIC FUNCTION. RCA IS LARGE AND DOMINANT  . Transthoracic echocardiogram 06/26/2011    EF 55-60%    No family history on file.  History  Substance Use Topics  . Smoking status: Never Smoker   . Smokeless tobacco: Not on file  . Alcohol Use: No      Review of Systems  Constitutional: Negative for fever, chills and fatigue.  HENT: Negative for trouble swallowing and neck pain.   Eyes: Negative for redness and visual disturbance.  Respiratory: Negative for shortness of breath and wheezing.   Cardiovascular: Negative for leg swelling.  Gastrointestinal: Negative for nausea and vomiting.  Genitourinary: Negative for dysuria and hematuria.  Musculoskeletal: Negative for myalgias and back pain.  Skin: Positive for wound (to the face and hands).  Neurological: Negative for loss of consciousness, weakness and headaches.  Hematological: Bruises/bleeds easily.    Allergies  Review of patient's allergies indicates no known allergies.  Home Medications   Current Outpatient Rx  Name Route Sig Dispense Refill  . AMLODIPINE BESYLATE 5 MG PO TABS Oral Take 5 mg by mouth daily.      . ASPIRIN EC 81 MG PO TBEC Oral Take 81 mg  by mouth daily.      Marland Kitchen CARVEDILOL 3.125 MG PO TABS Oral Take 3.125 mg by mouth 2 (two) times daily with a meal.      . CLOPIDOGREL BISULFATE 75 MG PO TABS Oral Take 75 mg by mouth daily.      Marland Kitchen HYDROCODONE-ACETAMINOPHEN 5-500 MG PO CAPS Oral Take 1 capsule by mouth every 6 (six) hours as needed.      Marland Kitchen NITROGLYCERIN 0.4 MG SL SUBL Sublingual Place 0.4 mg under the tongue every 5 (five) minutes as needed.      Marland Kitchen ROSUVASTATIN CALCIUM 20 MG PO TABS Oral Take 20 mg by mouth daily.      Marland Kitchen VALSARTAN-HYDROCHLOROTHIAZIDE 160-12.5 MG PO TABS Oral Take 1 tablet by mouth daily.        Triage Vitals: BP 150/69  Pulse 83  Temp(Src)  97.7 F (36.5 C) (Oral)  Resp 20  Ht 6' (1.829 m)  Wt 225 lb (102.059 kg)  BMI 30.52 kg/m2  SpO2 100%  Physical Exam  Nursing note and vitals reviewed. Constitutional: He is oriented to person, place, and time. He appears well-developed and well-nourished.  HENT:  Head: Head is with abrasion.       4 cm hematoma and abrasion to the left forehead, abrasion to the left cheek  Eyes: Conjunctivae and EOM are normal.  Neck: Neck supple. No tracheal deviation present.  Cardiovascular: Normal rate, regular rhythm and normal heart sounds.   Pulmonary/Chest: Effort normal and breath sounds normal.  Abdominal: Soft. Bowel sounds are normal.  Musculoskeletal: Normal range of motion. He exhibits no edema.  Neurological: He is alert and oriented to person, place, and time.       Strength is normal in all extremities   Skin: Skin is warm and dry.       1 cm abrasion on the distal and proximal part of the left little finger, abrasions to the right index finger, 1 cm skin evulsion and 1 cm abrasion to the right middle finger    ED Course  Procedures (including critical care time)  DIAGNOSTIC STUDIES: Oxygen Saturation is 100% on room air, normal by my interpretation.    COORDINATION OF CARE: 10:30PM-Discussed treatment plan with relatives and patient at bedside and relatives and patient agreed to plan.   Labs Reviewed - No data to display Ct Head Wo Contrast  08/08/2011  *RADIOLOGY REPORT*  Clinical Data:  Fall.  Head injury.  Left forehead lacerations and neck pain  CT HEAD WITHOUT CONTRAST CT MAXILLOFACIAL WITHOUT CONTRAST CT CERVICAL SPINE WITHOUT CONTRAST  Technique:  Multidetector CT imaging of the head, cervical spine, and maxillofacial structures were performed using the standard protocol without intravenous contrast. Multiplanar CT image reconstructions of the cervical spine and maxillofacial structures were also generated.  Comparison:  Head CT 02/17/2011  CT HEAD  Findings: There is  a left frontal scalp hematoma.  Stable mild cerebral atrophy and mild chronic microvascular ischemic change. Negative for intracranial hemorrhage, hydrocephalus, mass effect, or mass lesion. The visualized paranasal sinuses, mastoid air cells, and middle ears are clear.  The skull is intact.  IMPRESSION:  1.  Left frontal scalp hematoma. 2.  No acute intracranial abnormality.  CT MAXILLOFACIAL  Findings:  The paranasal sinuses are clear.  There is artifact from dental hardware.  The mandibular condyles are located.  The facial bones are intact.  No acute facial bone fracture is seen.  Soft tissue swelling of the left frontal region is partially visualized, and  There is soft tissue swelling of the left cheek.  The left frontal soft tissue swelling is best seen on the concurrent head CT.  IMPRESSION:  1.  Negative for facial bone fracture or sinus disease. 2.  Soft tissue swelling of the left cheek and left frontal scalp (the frontal scalp is best seen on head CT).  CT CERVICAL SPINE  Findings:   Normal alignment from the skull base through the cervicothoracic junction.  Normal vertebral body heights.  Anterior osteophyte formation at a few levels.  No acute cervical spine fracture is identified. Normal prevertebral soft tissue contour.  IMPRESSION:  No evidence of acute cervical spine fracture.  Original Report Authenticated By: Britta Mccreedy, M.D.   Ct Cervical Spine Wo Contrast  08/08/2011  *RADIOLOGY REPORT*  Clinical Data:  Fall.  Head injury.  Left forehead lacerations and neck pain  CT HEAD WITHOUT CONTRAST CT MAXILLOFACIAL WITHOUT CONTRAST CT CERVICAL SPINE WITHOUT CONTRAST  Technique:  Multidetector CT imaging of the head, cervical spine, and maxillofacial structures were performed using the standard protocol without intravenous contrast. Multiplanar CT image reconstructions of the cervical spine and maxillofacial structures were also generated.  Comparison:  Head CT 02/17/2011  CT HEAD  Findings: There is  a left frontal scalp hematoma.  Stable mild cerebral atrophy and mild chronic microvascular ischemic change. Negative for intracranial hemorrhage, hydrocephalus, mass effect, or mass lesion. The visualized paranasal sinuses, mastoid air cells, and middle ears are clear.  The skull is intact.  IMPRESSION:  1.  Left frontal scalp hematoma. 2.  No acute intracranial abnormality.  CT MAXILLOFACIAL  Findings:  The paranasal sinuses are clear.  There is artifact from dental hardware.  The mandibular condyles are located.  The facial bones are intact.  No acute facial bone fracture is seen.  Soft tissue swelling of the left frontal region is partially visualized, and  There is soft tissue swelling of the left cheek.  The left frontal soft tissue swelling is best seen on the concurrent head CT.  IMPRESSION:  1.  Negative for facial bone fracture or sinus disease. 2.  Soft tissue swelling of the left cheek and left frontal scalp (the frontal scalp is best seen on head CT).  CT CERVICAL SPINE  Findings:   Normal alignment from the skull base through the cervicothoracic junction.  Normal vertebral body heights.  Anterior osteophyte formation at a few levels.  No acute cervical spine fracture is identified. Normal prevertebral soft tissue contour.  IMPRESSION:  No evidence of acute cervical spine fracture.  Original Report Authenticated By: Britta Mccreedy, M.D.   Ct Maxillofacial Wo Cm  08/08/2011  *RADIOLOGY REPORT*  Clinical Data:  Fall.  Head injury.  Left forehead lacerations and neck pain  CT HEAD WITHOUT CONTRAST CT MAXILLOFACIAL WITHOUT CONTRAST CT CERVICAL SPINE WITHOUT CONTRAST  Technique:  Multidetector CT imaging of the head, cervical spine, and maxillofacial structures were performed using the standard protocol without intravenous contrast. Multiplanar CT image reconstructions of the cervical spine and maxillofacial structures were also generated.  Comparison:  Head CT 02/17/2011  CT HEAD  Findings: There is a left  frontal scalp hematoma.  Stable mild cerebral atrophy and mild chronic microvascular ischemic change. Negative for intracranial hemorrhage, hydrocephalus, mass effect, or mass lesion. The visualized paranasal sinuses, mastoid air cells, and middle ears are clear.  The skull is intact.  IMPRESSION:  1.  Left frontal scalp hematoma. 2.  No acute intracranial abnormality.  CT MAXILLOFACIAL  Findings:  The  paranasal sinuses are clear.  There is artifact from dental hardware.  The mandibular condyles are located.  The facial bones are intact.  No acute facial bone fracture is seen.  Soft tissue swelling of the left frontal region is partially visualized, and  There is soft tissue swelling of the left cheek.  The left frontal soft tissue swelling is best seen on the concurrent head CT.  IMPRESSION:  1.  Negative for facial bone fracture or sinus disease. 2.  Soft tissue swelling of the left cheek and left frontal scalp (the frontal scalp is best seen on head CT).  CT CERVICAL SPINE  Findings:   Normal alignment from the skull base through the cervicothoracic junction.  Normal vertebral body heights.  Anterior osteophyte formation at a few levels.  No acute cervical spine fracture is identified. Normal prevertebral soft tissue contour.  IMPRESSION:  No evidence of acute cervical spine fracture.  Original Report Authenticated By: Britta Mccreedy, M.D.     1. Fall   2. Head injury   3. Abrasions of multiple sites       MDM  Patient with history of Parkinson's patient does stumbled coming out of restaurant and fell against a brick wall striking the left side of his for head and face and then hitting his hands on the ground. No loss of consciousness. Patient with marked abrasions to left 4 heads left cheek and the today little fingers index finger and middle finger of the his hand, no obvious deformity head CT without any significant findings, CT of face without any facial fx, CT of neck without fracture. X-ray of  hands as per radiology.  Radiology findings and concerns for bilateral fifth finger or metacarpal subluxation noted patient has good range of motion of both fingers patient has a history of Parkinson's and does have some trouble with control of his hands. Agree with x-ray findings if there is no fracture without patient followup with orthopedics in town Dr. Hilda Lias.    I personally performed the services described in this documentation, which was scribed in my presence. The recorded information has been reviewed and considered.     Shelda Jakes, MD 08/09/11 0454  Shelda Jakes, MD 08/09/11 [id number]

## 2011-08-08 NOTE — ED Notes (Signed)
States pt tripped & fall against a wall. Hematoma & abrasion noted to the left forehead. Abrasion to the right hand. PERRLA in tact. All abrasions cleaned w/ surclens.

## 2011-08-08 NOTE — ED Notes (Signed)
Family states pt requesting something for pain & when he was going to be seen by dr. Meryle Ready informed edp had signed up on chart & should be in shortly.

## 2011-08-08 NOTE — ED Notes (Signed)
Pt presents with closed head injury, abrasion to left cheek, and pain to bilat hands after falling onto a brick wall.

## 2011-08-09 MED ORDER — BACITRACIN-NEOMYCIN-POLYMYXIN 400-5-5000 EX OINT
TOPICAL_OINTMENT | CUTANEOUS | Status: AC
  Administered 2011-08-09: 2
  Filled 2011-08-09: qty 2

## 2011-08-09 MED ORDER — HYDROCODONE-ACETAMINOPHEN 5-325 MG PO TABS
1.0000 | ORAL_TABLET | Freq: Once | ORAL | Status: AC
  Administered 2011-08-09: 1 via ORAL
  Filled 2011-08-09: qty 1

## 2011-08-09 NOTE — ED Notes (Signed)
Neosporin applied to all abrasions, sites covered w/ sterile dressings.

## 2011-08-09 NOTE — ED Notes (Signed)
Pt sitting in chair in room . No needs voiced at this time.

## 2011-08-09 NOTE — ED Notes (Signed)
Pt given discharge instructions, paperwork & prescription(s), pt verbalized understanding.   

## 2011-08-13 ENCOUNTER — Ambulatory Visit (INDEPENDENT_AMBULATORY_CARE_PROVIDER_SITE_OTHER): Payer: Medicare HMO | Admitting: Nurse Practitioner

## 2011-08-13 ENCOUNTER — Other Ambulatory Visit: Payer: Self-pay | Admitting: *Deleted

## 2011-08-13 ENCOUNTER — Encounter: Payer: Self-pay | Admitting: Nurse Practitioner

## 2011-08-13 VITALS — BP 120/70 | HR 74 | Ht 72.0 in | Wt 232.4 lb

## 2011-08-13 DIAGNOSIS — I2510 Atherosclerotic heart disease of native coronary artery without angina pectoris: Secondary | ICD-10-CM

## 2011-08-13 DIAGNOSIS — W19XXXA Unspecified fall, initial encounter: Secondary | ICD-10-CM

## 2011-08-13 DIAGNOSIS — G20A1 Parkinson's disease without dyskinesia, without mention of fluctuations: Secondary | ICD-10-CM

## 2011-08-13 DIAGNOSIS — G20 Parkinson's disease: Secondary | ICD-10-CM

## 2011-08-13 DIAGNOSIS — I10 Essential (primary) hypertension: Secondary | ICD-10-CM

## 2011-08-13 NOTE — Assessment & Plan Note (Signed)
Blood pressure looks good. No change in his current medicines.  

## 2011-08-13 NOTE — Assessment & Plan Note (Signed)
He is to see neurology today.

## 2011-08-13 NOTE — Assessment & Plan Note (Signed)
He is doing well from our standpoint. No symptoms. Will continue with medical management. I will have him see Dr. Elease Hashimoto in 3 months. Patient is agreeable to this plan and will call if any problems develop in the interim.

## 2011-08-13 NOTE — Assessment & Plan Note (Signed)
He was evaluated this past Saturday night in the ER. Everything checked out ok. I have advised his wife to monitor him a little closer over the next few weeks. He does remain on Plavix and did hit his head.

## 2011-08-13 NOTE — Patient Instructions (Signed)
Stay on your current medicines  We will see you back in about 3 months.  Be on the lookout for the next couple of weeks of headaches, nausea or being lethargic. This could indicate bleeding in the head and would need to be evaluated with a repeat CT scan of the head  Call the [facility] office at [phone] if you have any questions, problems or concerns.

## 2011-08-13 NOTE — Progress Notes (Addendum)
[name] Date of Birth: [birth year] Medical Record #[id number]  History of Present Illness: [name] is seen back today for a one month follow up visit. He is seen for Dr. Elease Hashimoto. He has had prior NSTEMI. Has preserved LV function with severe 3 vessel disease. He was felt to best be managed medically and not felt to be a good surgical candidate.   He comes in today. He is here with his wife. No chest pain or shortness of breath. Not dizzy or lightheaded. He did have a fall this past weekend. He tripped over his feet trying to get out of a chair at a restaurant and smacked up against a brick wall. No syncope. He was taken to the ER and had his head scanned. Everything checked out ok. He is bruised up.   Current Outpatient Prescriptions on File Prior to Visit  Medication Sig Dispense Refill  . amLODipine (NORVASC) 5 MG tablet Take 5 mg by mouth daily.        Marland Kitchen aspirin EC 81 MG tablet Take 81 mg by mouth daily.        . carvedilol (COREG) 3.125 MG tablet Take 3.125 mg by mouth 2 (two) times daily with a meal.        . clopidogrel (PLAVIX) 75 MG tablet Take 75 mg by mouth daily.        . hydrocodone-acetaminophen (LORCET-HD) 5-500 MG per capsule Take 1 capsule by mouth every 6 (six) hours as needed.        . nitroGLYCERIN (NITROSTAT) 0.4 MG SL tablet Place 0.4 mg under the tongue every 5 (five) minutes as needed.        . rosuvastatin (CRESTOR) 20 MG tablet Take 20 mg by mouth daily.        . valsartan-hydrochlorothiazide (DIOVAN-HCT) 160-12.5 MG per tablet Take 1 tablet by mouth daily.          No Known Allergies  Past Medical History  Diagnosis Date  . Hypertension   . PAF (paroxysmal atrial fibrillation)   . Parkinson's disease   . Stroke     Hx of remote stroke  . Gout   . Non-ST elevated myocardial infarction (non-STEMI)   . Coronary artery disease     SEVERE 3-VESSEL DX WITH NORMAL EF    Past Surgical History  Procedure Date  . Coronary stent placement 2000  . Cardiac  catheterization 06/26/2011    NORMAL LV SYSTOLIC FUNCTION. RCA IS LARGE AND DOMINANT  . Transthoracic echocardiogram 06/26/2011    EF 55-60%    History  Smoking status  . Never Smoker   Smokeless tobacco  . Not on file    History  Alcohol Use No    History reviewed. No pertinent family history.  Review of Systems: The review of systems is positive for recent fall. No cardiac complaints.  Wife reports that ever since the heart attack, he sleeps poorly at night. All other systems were reviewed and are negative.  Physical Exam: BP 120/70  Pulse 74  Ht 6' (1.829 m)  Wt 232 lb 6.4 oz (105.416 kg)  BMI 31.52 kg/m2 Patient is very pleasant and in no acute distress. He is slow to speak. Skin is warm and dry. Color is normal.  HEENT is unremarkable except for bruising down the left side of the head. Normocephalic/atraumatic. PERRL. Sclera are nonicteric. Neck is supple. No masses. No JVD. Lungs are clear. Cardiac exam shows a regular rate and rhythm. Abdomen is soft. Extremities are  without edema.Hands have abrasions bilaterally. Gait and ROM are intact but slow. No gross neurologic deficits noted.   LABORATORY DATA: N/A  Assessment / Plan:

## 2011-09-08 HISTORY — PX: SKIN CANCER EXCISION: SHX779

## 2011-09-28 ENCOUNTER — Other Ambulatory Visit (HOSPITAL_COMMUNITY)
Admission: RE | Admit: 2011-09-28 | Discharge: 2011-09-28 | Disposition: A | Discharge status: Home / Self Care | Payer: Medicare HMO | Source: Ambulatory Visit | Attending: Otolaryngology | Admitting: Otolaryngology

## 2011-09-28 DIAGNOSIS — R221 Localized swelling, mass and lump, neck: Secondary | ICD-10-CM | POA: Insufficient documentation

## 2011-09-28 DIAGNOSIS — R220 Localized swelling, mass and lump, head: Secondary | ICD-10-CM | POA: Insufficient documentation

## 2011-10-13 ENCOUNTER — Emergency Department (HOSPITAL_COMMUNITY): Payer: Medicare HMO

## 2011-10-13 ENCOUNTER — Emergency Department (HOSPITAL_COMMUNITY)
Admission: EM | Admit: 2011-10-13 | Discharge: 2011-10-13 | Disposition: A | Discharge status: Home / Self Care | Payer: Medicare HMO | Attending: Emergency Medicine | Admitting: Emergency Medicine

## 2011-10-13 ENCOUNTER — Encounter (HOSPITAL_COMMUNITY): Payer: Self-pay | Admitting: *Deleted

## 2011-10-13 DIAGNOSIS — Z7982 Long term (current) use of aspirin: Secondary | ICD-10-CM | POA: Insufficient documentation

## 2011-10-13 DIAGNOSIS — I4891 Unspecified atrial fibrillation: Secondary | ICD-10-CM | POA: Insufficient documentation

## 2011-10-13 DIAGNOSIS — I252 Old myocardial infarction: Secondary | ICD-10-CM | POA: Insufficient documentation

## 2011-10-13 DIAGNOSIS — W19XXXA Unspecified fall, initial encounter: Secondary | ICD-10-CM | POA: Insufficient documentation

## 2011-10-13 DIAGNOSIS — Z23 Encounter for immunization: Secondary | ICD-10-CM | POA: Insufficient documentation

## 2011-10-13 DIAGNOSIS — I10 Essential (primary) hypertension: Secondary | ICD-10-CM | POA: Insufficient documentation

## 2011-10-13 DIAGNOSIS — S42401A Unspecified fracture of lower end of right humerus, initial encounter for closed fracture: Secondary | ICD-10-CM

## 2011-10-13 DIAGNOSIS — M25569 Pain in unspecified knee: Secondary | ICD-10-CM | POA: Insufficient documentation

## 2011-10-13 DIAGNOSIS — I2510 Atherosclerotic heart disease of native coronary artery without angina pectoris: Secondary | ICD-10-CM | POA: Insufficient documentation

## 2011-10-13 DIAGNOSIS — Z8673 Personal history of transient ischemic attack (TIA), and cerebral infarction without residual deficits: Secondary | ICD-10-CM | POA: Insufficient documentation

## 2011-10-13 DIAGNOSIS — IMO0002 Reserved for concepts with insufficient information to code with codable children: Secondary | ICD-10-CM | POA: Insufficient documentation

## 2011-10-13 DIAGNOSIS — S42409A Unspecified fracture of lower end of unspecified humerus, initial encounter for closed fracture: Secondary | ICD-10-CM | POA: Insufficient documentation

## 2011-10-13 DIAGNOSIS — M25529 Pain in unspecified elbow: Secondary | ICD-10-CM | POA: Insufficient documentation

## 2011-10-13 MED ORDER — TETANUS-DIPHTH-ACELL PERTUSSIS 5-2.5-18.5 LF-MCG/0.5 IM SUSP: 0.5000 mL | Freq: Once | INTRAMUSCULAR | Status: DC

## 2011-10-13 MED ORDER — TETANUS-DIPHTHERIA TOXOIDS TD 5-2 LFU IM INJ
0.5000 mL | INJECTION | Freq: Once | INTRAMUSCULAR | Status: AC
  Administered 2011-10-13: 0.5 mL via INTRAMUSCULAR
  Filled 2011-10-13: qty 0.5

## 2011-10-13 MED ORDER — HYDROMORPHONE HCL PF 1 MG/ML IJ SOLN
1.0000 mg | Freq: Once | INTRAMUSCULAR | Status: AC
  Administered 2011-10-13: 1 mg via INTRAMUSCULAR
  Filled 2011-10-13: qty 1

## 2011-10-13 MED ORDER — HYDROCODONE-ACETAMINOPHEN 5-325 MG PO TABS
1.0000 | ORAL_TABLET | Freq: Once | ORAL | Status: AC
  Administered 2011-10-13: 1 via ORAL
  Filled 2011-10-13: qty 1

## 2011-10-13 NOTE — ED Notes (Signed)
Pain rt elbow, and knees.abrasion to rt elbow.  No LOC,

## 2011-10-13 NOTE — ED Provider Notes (Signed)
History    This chart was scribed for Mark Lennert, MD, MD by Smitty Pluck. The patient was seen in room APA03 and the patient's care was started at 4:08PM.   CSN: [id number]  Arrival date & time 10/13/11  1543   First MD Initiated Contact with Patient 10/13/11 1555      Chief Complaint  Patient presents with  . Fall    (Consider location/radiation/quality/duration/timing/severity/associated sxs/prior treatment) Patient is a 66 y.o. male presenting with fall. The history is provided by the patient and a relative.  Fall The accident occurred 3 to 5 hours ago. The fall occurred while walking. He landed on concrete. The point of impact was the right elbow, left knee and right knee. The pain is present in the right elbow, left knee and right knee. The pain is moderate. Pertinent negatives include no numbness and no loss of consciousness. The symptoms are aggravated by flexion. He has tried nothing for the symptoms.   Mark [name] is a 66 y.o. male who presents to the Emergency Department complaining of fall onset today. Pt reports his right foot hitting the light on floor and falling. Pt has moderate bilateral knee pain and right elbow pain. Pain has been constant since onset without radiation. He denies hitting head and LOC. Pt has history of Parkinson's disease, HTN, stroke and gout.  Past Medical History  Diagnosis Date  . Hypertension   . PAF (paroxysmal atrial fibrillation)   . Parkinson's disease   . Stroke     Hx of remote stroke  . Gout   . Non-ST elevated myocardial infarction (non-STEMI)   . Coronary artery disease     SEVERE 3-VESSEL DX WITH NORMAL EF    Past Surgical History  Procedure Date  . Coronary stent placement 2000  . Cardiac catheterization 06/26/2011    NORMAL LV SYSTOLIC FUNCTION. RCA IS LARGE AND DOMINANT  . Transthoracic echocardiogram 06/26/2011    EF 55-60%    History reviewed. No pertinent family history.  History  Substance Use Topics  .  Smoking status: Never Smoker   . Smokeless tobacco: Not on file  . Alcohol Use: No      Review of Systems  Neurological: Negative for loss of consciousness and numbness.  All other systems reviewed and are negative.   10 Systems reviewed and are negative for acute change except as noted in the HPI.  Allergies  Review of patient's allergies indicates no known allergies.  Home Medications   Current Outpatient Rx  Name Route Sig Dispense Refill  . AMLODIPINE BESYLATE 5 MG PO TABS Oral Take 5 mg by mouth daily.      . ASPIRIN EC 81 MG PO TBEC Oral Take 81 mg by mouth daily.      Marland Kitchen CARVEDILOL 3.125 MG PO TABS Oral Take 3.125 mg by mouth 2 (two) times daily with a meal.      . CLOPIDOGREL BISULFATE 75 MG PO TABS Oral Take 75 mg by mouth daily.      Marland Kitchen HYDROCODONE-ACETAMINOPHEN 5-500 MG PO CAPS Oral Take 1 capsule by mouth every 6 (six) hours as needed.      Marland Kitchen NITROGLYCERIN 0.4 MG SL SUBL Sublingual Place 0.4 mg under the tongue every 5 (five) minutes as needed.      Marland Kitchen ROSUVASTATIN CALCIUM 20 MG PO TABS Oral Take 20 mg by mouth daily.      Marland Kitchen VALSARTAN-HYDROCHLOROTHIAZIDE 160-12.5 MG PO TABS Oral Take 1 tablet by mouth daily.  BP 121/77  Pulse 74  Temp(Src) 97.7 F (36.5 C) (Oral)  Resp 18  Ht 6' (1.829 m)  Wt 225 lb (102.059 kg)  BMI 30.52 kg/m2  SpO2 100%  Physical Exam  Nursing note and vitals reviewed. Constitutional: He is oriented to person, place, and time. He appears well-developed and well-nourished. No distress.  HENT:  Head: Normocephalic and atraumatic.  Eyes: Conjunctivae and EOM are normal. No scleral icterus.  Neck: Neck supple. No thyromegaly present.  Cardiovascular: Normal rate and regular rhythm.  Exam reveals no gallop and no friction rub.   No murmur heard. Pulmonary/Chest: No stridor. He has no wheezes. He has no rales. He exhibits no tenderness.  Abdominal: He exhibits no distension. There is no tenderness. There is no rebound.  Musculoskeletal:  Normal range of motion. He exhibits tenderness (anterior knees bilaterally and posterior right elbow). He exhibits no edema.  Lymphadenopathy:    He has no cervical adenopathy.  Neurological: He is oriented to person, place, and time. Coordination normal.  Skin: No rash noted. No erythema.       Bilateral abrasion on knees Abrasion on right elbow   Psychiatric: He has a normal mood and affect. His behavior is normal.    ED Course  Procedures (including critical care time)  DIAGNOSTIC STUDIES: Oxygen Saturation is 100% on room air, normal by my interpretation.    COORDINATION OF CARE:  4:19 PM EDP ordered medication; Boostrox 0.5 mL  Labs Reviewed - No data to display Dg Elbow Complete Right  10/13/2011  *RADIOLOGY REPORT*  Clinical Data: Fall, pain  RIGHT ELBOW - COMPLETE 3+ VIEW  Comparison: None.  Findings: There is an olecranon fracture through the olecranon fossa with significant displacement of the proximal and distal fragments.  Marked soft tissue swelling is present.  The radial head appears intact.  There is a large joint effusion.  IMPRESSION: Widely separated olecranon fracture.  Soft tissue swelling with joint effusion.  Original Report Authenticated By: Elsie Stain, M.D.   Dg Knee Complete 4 Views Left  10/13/2011  *RADIOLOGY REPORT*  Clinical Data: Fall, pain  LEFT KNEE - COMPLETE 4+ VIEW  Comparison: None.  Findings: Mild degenerative change at the patellofemoral and medial compartments.  No fracture or effusion.  IMPRESSION: As above.  Original Report Authenticated By: Elsie Stain, M.D.   Dg Knee Complete 4 Views Right  10/13/2011  *RADIOLOGY REPORT*  Clinical Data: Fall, pain  RIGHT KNEE - COMPLETE 4+ VIEW  Comparison:  None.  Findings:  There is no evidence of fracture, dislocation, or joint effusion. Mild degenerative change is seen in the medial compartment and patellofemoral compartment.  Soft tissues are unremarkable.  IMPRESSION: No acute findings.  Original  Report Authenticated By: Elsie Stain, M.D.     No diagnosis found.  I spoke with dr. Romeo Apple and he will see the pt tomorrow MDM        The chart was scribed for me under my direct supervision.  I personally performed the history, physical, and medical decision making and all procedures in the evaluation of this patient.Mark Lennert, MD 10/13/11 [phone]

## 2011-10-13 NOTE — ED Notes (Signed)
Beeped Dr. Romeo Apple to [phone].

## 2011-10-14 ENCOUNTER — Ambulatory Visit (INDEPENDENT_AMBULATORY_CARE_PROVIDER_SITE_OTHER): Payer: Medicare HMO | Admitting: Orthopedic Surgery

## 2011-10-14 ENCOUNTER — Telehealth: Payer: Self-pay | Admitting: Orthopedic Surgery

## 2011-10-14 ENCOUNTER — Encounter: Payer: Self-pay | Admitting: Orthopedic Surgery

## 2011-10-14 VITALS — BP 100/64 | Ht 72.0 in | Wt 225.0 lb

## 2011-10-14 DIAGNOSIS — S52023A Displaced fracture of olecranon process without intraarticular extension of unspecified ulna, initial encounter for closed fracture: Secondary | ICD-10-CM

## 2011-10-14 MED ORDER — HYDROCODONE-ACETAMINOPHEN 7.5-325 MG PO TABS: 1.0000 | ORAL_TABLET | ORAL | Status: DC | PRN

## 2011-10-14 NOTE — Patient Instructions (Signed)
KEEP ARM IN SLING AND SPLINT  TAKE NORCO 7.5 MG Q 4 HRS PRN PAIN   SEE CARDIOLOGIST FOR CLEARANCE

## 2011-10-14 NOTE — Telephone Encounter (Signed)
Re: appointment with cardiologist for surgical clearance:  [facility], [place], [place] office at ph [phone]; spoke with Magdalene River, and have scheduled appointment with Annice Needy, PA, for tomorrow 10/15/11, at 9:15am.  Patient is aware of appointment. Note faxed to [facility], fax [phone].

## 2011-10-15 ENCOUNTER — Encounter (HOSPITAL_COMMUNITY): Payer: Self-pay | Admitting: Pharmacy Technician

## 2011-10-15 ENCOUNTER — Encounter: Payer: Self-pay | Admitting: Nurse Practitioner

## 2011-10-15 ENCOUNTER — Ambulatory Visit (INDEPENDENT_AMBULATORY_CARE_PROVIDER_SITE_OTHER): Payer: Medicare HMO | Admitting: Nurse Practitioner

## 2011-10-15 VITALS — BP 90/60 | HR 71 | Ht 72.0 in | Wt 230.0 lb

## 2011-10-15 DIAGNOSIS — I2510 Atherosclerotic heart disease of native coronary artery without angina pectoris: Secondary | ICD-10-CM

## 2011-10-15 DIAGNOSIS — I10 Essential (primary) hypertension: Secondary | ICD-10-CM

## 2011-10-15 DIAGNOSIS — Z01818 Encounter for other preprocedural examination: Secondary | ICD-10-CM | POA: Insufficient documentation

## 2011-10-15 DIAGNOSIS — W19XXXA Unspecified fall, initial encounter: Secondary | ICD-10-CM

## 2011-10-15 MED ORDER — AMLODIPINE BESYLATE 5 MG PO TABS: 2.5000 mg | ORAL_TABLET | Freq: Every day | ORAL | Status: DC

## 2011-10-15 NOTE — Assessment & Plan Note (Signed)
Patient has had prior NSTEMI back in October of 2012. Has severe 3 vessel disease. Does have normal systolic function. Cath report is reviewed with Dr. Elease Hashimoto. Does have multiple sites for potential ischemia. He is felt to be high risk for any procedure. Currently not having any cardiac complaints.

## 2011-10-15 NOTE — Patient Instructions (Addendum)
Cut the amlodipine in half and only take a half of a tablet each morning.  Stop the Plavix for now.   Try to get a blood pressure cuff at home and monitor your blood pressure.  You may have your surgery. You are at a higher risk with your heart condition but will need to have your arm fixed. We would prefer you have your surgery at [facility] hospital.  We will see you in March as scheduled.  Call the [facility] office at [phone] if you have any questions, problems or concerns.

## 2011-10-15 NOTE — Assessment & Plan Note (Signed)
He has had another fall. Now with broken right arm. For surgical intervention tomorrow.

## 2011-10-15 NOTE — Assessment & Plan Note (Signed)
Blood pressure is running low at home and is only 90/60 by me. I have cut his Norvasc to 2.5mg  daily. Would want to continue his beta blocker. Family will try to monitor at home.

## 2011-10-15 NOTE — Progress Notes (Signed)
Mark [name] Date of Birth: 1946/08/17 Medical Record #[id number]  History of Present Illness: Mark [name] is seen today for a pre operative clearance visit. He is seen for Dr. Elease Hashimoto. He is a 66 year old male with Parkinson's disease. Had a recent NSTEMI back in October and was found to have severe 3 vessel disease. He has well preserved LV function. He was not felt to be a satisfactory candidate for CABG and has been managed medically.   He comes in today. He is here with a family member. He has had another fall. He has now broken his right arm (olecranon fracture) and needs surgical intervention. Surgery is planned for tomorrow. From a cardiac standpoint, he has been doing ok. He denies chest pain. Says he is not short of breath. He has had no syncope. This most recent fall was from getting tripped up again. Blood pressure has been running on the lower side. He is not dizzy or lightheaded.   Current Outpatient Prescriptions on File Prior to Visit  Medication Sig Dispense Refill  . aspirin EC 81 MG tablet Take 81 mg by mouth daily.        Marland Kitchen BACTROBAN 2 % Apply 1 application topically as needed.      . carvedilol (COREG) 3.125 MG tablet Take 3.125 mg by mouth 2 (two) times daily with a meal.        . clopidogrel (PLAVIX) 75 MG tablet Take 75 mg by mouth daily.        . hydrocodone-acetaminophen (LORCET-HD) 5-500 MG per capsule Take 1 capsule by mouth every 6 (six) hours as needed.        Marland Kitchen HYDROcodone-acetaminophen (NORCO) 7.5-325 MG per tablet Take 1 tablet by mouth every 4 (four) hours as needed for pain.  42 tablet  1  . nitroGLYCERIN (NITROSTAT) 0.4 MG SL tablet Place 0.4 mg under the tongue every 5 (five) minutes as needed.        . rosuvastatin (CRESTOR) 20 MG tablet Take 20 mg by mouth daily.        . valsartan-hydrochlorothiazide (DIOVAN-HCT) 160-12.5 MG per tablet Take 1 tablet by mouth daily.        Marland Kitchen DISCONTD: amLODipine (NORVASC) 5 MG tablet Take 5 mg by mouth daily.          No  Known Allergies  Past Medical History  Diagnosis Date  . Hypertension   . PAF (paroxysmal atrial fibrillation)   . Parkinson's disease   . Stroke     Hx of remote stroke  . Gout   . Non-ST elevated myocardial infarction (non-STEMI)   . Coronary artery disease     SEVERE 3-VESSEL DX WITH NORMAL EF  . Fall     Past Surgical History  Procedure Date  . Coronary stent placement 2000  . Cardiac catheterization 06/26/2011    NORMAL LV SYSTOLIC FUNCTION. RCA IS LARGE AND DOMINANT  . Transthoracic echocardiogram 06/26/2011    EF 55-60%    History  Smoking status  . Never Smoker   Smokeless tobacco  . Not on file    History  Alcohol Use No    Family History  Problem Relation Age of Onset  . Heart disease    . Arthritis      Review of Systems: The review of systems is per the HPI.  All other systems were reviewed and are negative.  Physical Exam: BP 90/60  Pulse 71  Ht 6' (1.829 m)  Wt 230 lb (104.327  kg)  BMI 31.19 kg/m2 Patient is very pleasant and in no acute distress. He does have Parkinson facies. Slow movements. Skin is warm and dry. Color is normal.  HEENT is unremarkable. Normocephalic/atraumatic. PERRL. Sclera are nonicteric. Neck is supple. No masses. No JVD. Lungs are clear. Cardiac exam shows a regular rate and rhythm. Abdomen is soft. Extremities are without edema. Right arm is in a sling. Gait and ROM are intact but slow. No gross neurologic deficits noted.  LABORATORY DATA:  Dg Elbow Complete Right  10/13/2011  *RADIOLOGY REPORT*  Clinical Data: Fall, pain  RIGHT ELBOW - COMPLETE 3+ VIEW  Comparison: None.  Findings: There is an olecranon fracture through the olecranon fossa with significant displacement of the proximal and distal fragments.  Marked soft tissue swelling is present.  The radial head appears intact.  There is a large joint effusion.  IMPRESSION: Widely separated olecranon fracture.  Soft tissue swelling with joint effusion.  Original Report  Authenticated By: Elsie Stain, M.D.   Dg Knee Complete 4 Views Left  10/13/2011  *RADIOLOGY REPORT*  Clinical Data: Fall, pain  LEFT KNEE - COMPLETE 4+ VIEW  Comparison: None.  Findings: Mild degenerative change at the patellofemoral and medial compartments.  No fracture or effusion.  IMPRESSION: As above.  Original Report Authenticated By: Elsie Stain, M.D.   Dg Knee Complete 4 Views Right  10/13/2011  *RADIOLOGY REPORT*  Clinical Data: Fall, pain  RIGHT KNEE - COMPLETE 4+ VIEW  Comparison:  None.  Findings:  There is no evidence of fracture, dislocation, or joint effusion. Mild degenerative change is seen in the medial compartment and patellofemoral compartment.  Soft tissues are unremarkable.  IMPRESSION: No acute findings.  Original Report Authenticated By: Elsie Stain, M.D.    Assessment / Plan:

## 2011-10-15 NOTE — Assessment & Plan Note (Addendum)
I have reviewed his case with Dr. Elease Hashimoto. He is felt to be at high risk from a cardiac standpoint burt does need to have his arm fixed. He does have preserved LV function but has severe 3 vessel disease. Currently with no cardiac symptoms at the present time. He will need to stop his Plavix. His surgery is planned for tomorrow but with the Plavix on board, I suspect this will need to be rescheduled for next week. Probably needs to be off for 5 days and then restart in the post op phase. We would prefer he have his surgery at [facility] where he can be monitored accordingly. Would continue with his beta blocker as prescribed. Will alert Dr. Romeo Apple. Blood pressure medicine has been reduced to let his blood pressure come up a little. We will tentatively see him back in March as scheduled. Patient and daughter are agreeable to this plan and will call if any problems develop in the interim.

## 2011-10-19 ENCOUNTER — Encounter (HOSPITAL_COMMUNITY): Admission: RE | Admit: 2011-10-19 | Discharge: 2011-10-19 | Payer: Medicare HMO | Source: Ambulatory Visit

## 2011-10-19 ENCOUNTER — Encounter: Payer: Self-pay | Admitting: Orthopedic Surgery

## 2011-10-19 ENCOUNTER — Telehealth: Payer: Self-pay | Admitting: Orthopedic Surgery

## 2011-10-19 DIAGNOSIS — S52023A Displaced fracture of olecranon process without intraarticular extension of unspecified ulna, initial encounter for closed fracture: Secondary | ICD-10-CM | POA: Insufficient documentation

## 2011-10-19 NOTE — Patient Instructions (Signed)
[address]  10/19/2011   Your procedure is scheduled on:  10/23/11  Report to [facility] at 07:20 AM.  Call this number if you have problems the morning of surgery: [phone]   Remember:   Do not eat food:After Midnight.  May have clear liquids:until Midnight .  Clear liquids include soda, tea, black coffee, apple or grape juice, broth.  Take these medicines the morning of surgery with A SIP OF WATER: Norvasc, Sinemet, Coreg, Keppra and Diovan-HCT. Take your Norco only if needed.   Do not wear jewelry, make-up or nail polish.  Do not wear lotions, powders, or perfumes. You may wear deodorant.  Do not shave 48 hours prior to surgery.  Do not bring valuables to the hospital.  Contacts, dentures or bridgework may not be worn into surgery.  Leave suitcase in the car. After surgery it may be brought to your room.  For patients admitted to the hospital, checkout time is 11:00 AM the day of discharge.   Patients discharged the day of surgery will not be allowed to drive home.  Name and phone number of your driver:   Special Instructions: CHG Shower Use Special Wash: 1/2 bottle night before surgery and 1/2 bottle morning of surgery.   Please read over the following fact sheets that you were given: Pain Booklet, MRSA Information, Surgical Site Infection Prevention, Anesthesia Post-op Instructions and Care and Recovery After Surgery    Open Reduction, Internal Fixation (ORIF), Generic Usually, if bones are broken (fractured) and are out of place, unstable, or may become out of place, surgery is needed. This surgery is called an open reduction and internal fixation (ORIF). Open reduction means that the area of the fracture is opened up so the surgeon can see it. Internal fixation means that screws, pins, or fixation devices are used to hold the bone pieces in place. LET YOUR CAREGIVER KNOW ABOUT:   Allergies.   Medicines taken, including herbs, eyedrops, over-the-counter medicines, and  creams.   Use of steroids (by mouth or creams).   Previous problems with anesthetics or numbing medicines.   History of bleeding or blood problems.   History of blood clots.   Possibility of pregnancy, if this applies.   Previous surgery.   Other health problems.  RISKS AND COMPLICATIONS  All surgery is associated with risks. Some of these risks are:  Excessive bleeding.   Infection.   Imperfect results with loss of joint function.  BEFORE THE PROCEDURE  Usually, surgery is performed shortly after the injury. It is important to provide information to your caregiver after your injury. AFTER THE PROCEDURE  After surgery, you will be taken to a recovery area where a nurse will monitor your progress. You may have a long, narrow tube(catheter) in the bladder following surgery that helps you pass your water. When awake, stable, taking fluids well, and without complications, you will be returned to your room. You will receive physical therapy and other care. Physical therapy is done until you are doing well and your caregiver feels it is safe for you to go home or to an extended care facility. Following surgery, the bones may be protected with a cast. The type of casting depends on where the fracture was. Casts are generally left in place for about 5 to 6 weeks. During this time, your caregiver will follow your progress. X-rays may be taken during healing to make sure the bones stay in place. HOME CARE INSTRUCTIONS   You or your child  may resume normal diet and activities as directed or allowed.   Put ice on the injured area.   Put ice in a plastic bag.   Place a towel between the skin and the bag.   Leave the ice on for 15 to 20 minutes at a time, 3 to 4 times a day, for the first 2 days following surgery.   Change bandages (dressings) if necessary or as directed.   If given a plaster or fiberglass cast:   Do not try to scratch the skin under the cast using sharp or pointed  objects.   Check the skin around the cast every day. You may put lotion on any red or sore areas.   Keep the cast dry and clean.   Do not put pressure on any part of the cast or splint until it is fully hardened.   The cast or splint can be protected during bathing with a plastic bag. Do not lower the cast or splint into water.   Only take over-the-counter or prescription medicines for pain, discomfort, or fever as directed by your caregiver.   Use crutches as directed and do not exercise the leg unless instructed.   If the bones get out of position (displaced), it may eventually lead to arthritis and lasting disability. Problems can follow even the best of care. Follow the directions of your caregiver.   Follow all instructions given by your caregiver, make and keep follow-up appointments, and use crutches as directed.  SEEK IMMEDIATE MEDICAL CARE IF:   There is redness, swelling, numbness, or increasing pain in the wound.   There is pus coming from the wound.   You or your child has an oral temperature above 102 F (38.9 C), not controlled by medicine.   A bad smell is coming from the wound or dressing.   The wound breaks open (edges not staying together) after stitches (sutures) or staples have been removed.   The skin or nails below the injury turn blue or gray, or feel cold or numb.   There is severe pain under the cast or in the foot.  If there is not a window in the cast for observing the wound, a discharge or minor bleeding may show up as a stain on the outside of the cast. Report these findings to your caregiver. MAKE SURE YOU:   Understand these instructions.   Will watch your condition.   Will get help right away if you are not doing well or gets worse.  Document Released: 09/04/2006 Document Revised: 05/06/2011 Document Reviewed: 08/12/2007 [facility] Patient Information 2012 [place], [place].   PATIENT INSTRUCTIONS POST-ANESTHESIA  IMMEDIATELY FOLLOWING  SURGERY:  Do not drive or operate machinery for the first twenty four hours after surgery.  Do not make any important decisions for twenty four hours after surgery or while taking narcotic pain medications or sedatives.  If you develop intractable nausea and vomiting or a severe headache please notify your doctor immediately.  FOLLOW-UP:  Please make an appointment with your surgeon as instructed. You do not need to follow up with anesthesia unless specifically instructed to do so.  WOUND CARE INSTRUCTIONS (if applicable):  Keep a dry clean dressing on the anesthesia/puncture wound site if there is drainage.  Once the wound has quit draining you may leave it open to air.  Generally you should leave the bandage intact for twenty four hours unless there is drainage.  If the epidural site drains for more than 36-48 hours please  call the anesthesia department.  QUESTIONS?:  Please feel free to call your physician or the hospital operator if you have any questions, and they will be happy to assist you.     [facility] Anesthesia Department [address] [place] [phone]

## 2011-10-19 NOTE — Telephone Encounter (Addendum)
Patient's daughter Idalia Needle called (states patient is sleeping).  She states that she was contacted by [facility] about missing a pre-op appointment.  States patient was unaware of a pre-op appt, and states did not know about the scheduled surgery date of 10/23/11.  States also that [facility] cardiology office was to speak with or fax information about the cardiology consult, which was done the day after patient's appointment here last week.   States there was discussion at the cardiology office about the possibility of surgery at [facility].  Patient is next scheduled here for an appointment here on 10/21/11.  Phone # is [phone].  Please advise.  ** UPDATE * Patient's wife called a few minutes after above call.  Relayed same information and requests that the return call be made to her.  She is at work # [phone] until 5:00pm today.

## 2011-10-19 NOTE — Progress Notes (Signed)
Patient ID: [place], male   DOB: 24-Jul-1946, 66 y.o.   MRN: [id number]  Subjective:    Khylin Gutridge is a 66 y.o. male who presents with right elbow pain. The patient fell and fractured his RIGHT olecranon sustaining an abrasion over the olecranon but no evidence of open fracture.  He has a history of severe cardiac disease.  He complains of throbbing and has been constant pain associated with bruising swelling and complaints of numbness though not documented.  Pain rated 8/10. The following portions of the patient's history were reviewed and updated as appropriate: allergies, current medications, past family history, past medical history, past social history, past surgical history and problem list.  Review of Systems A comprehensive review of systems was negative except for: Integument/breast: positive for poor healing Hematologic/lymphatic: positive for easy bruising   Objective:    BP 100/64  Ht 6' (1.829 m)  Wt 225 lb (102.059 kg)  BMI 30.52 kg/m2 Physical Exam(12) GENERAL: normal development moderate grooming and hygiene.  CDV: pulses are normal in the radial artery  Skin: abnormal skin over the olecranon see below  Lymph: nodes were not palpable/normal  Psychiatric: awake, alert and oriented, flat affect  Neuro: normal sensation   Right elbow: there is swelling and tenderness over the RIGHT olecranon with palpable defect at the fracture site.  There is an abrasion over the elbow with no evidence of bony breakthrough.  Range of motion is limited to 30.  Extension power hasn't lost.correction extension power is lost.  No instability.  Left elbow:  without deformity and full active ROM   X-ray right elbow: no fracture, dislocation, swelling or degenerative changes noted and displaced olecranon fracture   Assessment:    right olecranon fracture    Plan:    and determination of surgery can be done at our hospital

## 2011-10-21 ENCOUNTER — Ambulatory Visit (INDEPENDENT_AMBULATORY_CARE_PROVIDER_SITE_OTHER): Payer: Medicare HMO | Admitting: Orthopedic Surgery

## 2011-10-21 ENCOUNTER — Encounter: Payer: Self-pay | Admitting: Orthopedic Surgery

## 2011-10-21 ENCOUNTER — Telehealth: Payer: Self-pay | Admitting: Orthopedic Surgery

## 2011-10-21 DIAGNOSIS — S52023A Displaced fracture of olecranon process without intraarticular extension of unspecified ulna, initial encounter for closed fracture: Secondary | ICD-10-CM

## 2011-10-21 NOTE — Telephone Encounter (Signed)
RE: Ortho referral to [facility] for surgery at [facility] --   [facility] Medicare [phone]) re: participating network providers for orthopedic surgeons in the [place] area, per patient request and per cardiologist consult. Spoke with representative Rayna Sexton. Received list of participating providers; relayed information to Dr. Romeo Apple and to patient.  Contacted Delbert Harness at ph # [phone] ([address] [place]). Reached PA, Kirstin Shepperson; Dr. Romeo Apple spoke with her direcly.  Referral appointment scheduled with Dr. Dion Saucier, for today, 10/21/11; patient to go there directly upon leaving our office.  Patient given copy of medical records and instructions.

## 2011-10-21 NOTE — Patient Instructions (Signed)
Referral to ortho at [facility]

## 2011-10-21 NOTE — Progress Notes (Signed)
Patient ID: Mark [name], male   DOB: 1946-04-23, 66 y.o.   MRN: [id number] This RIGHT olecranon fracture.  Approximately a week ago.  Cleared by cardiology for surgery only at [facility].  Skin checked. Skin healed and. Resplinted.  Waiting disposition.

## 2011-10-22 ENCOUNTER — Encounter (HOSPITAL_COMMUNITY): Payer: Self-pay

## 2011-10-22 ENCOUNTER — Encounter (HOSPITAL_COMMUNITY): Payer: Self-pay | Admitting: Pharmacy Technician

## 2011-10-22 NOTE — Anesthesia Preprocedure Evaluation (Addendum)
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  EF 55-60%   Neuro/Psych Parkinson's dz  CVA    GI/Hepatic   Endo/Other    Renal/GU      Musculoskeletal   Abdominal   Peds  Hematology   Anesthesia Other Findings   Reproductive/Obstetrics                         Anesthesia Physical Anesthesia Plan  ASA: III  Anesthesia Plan: General   Post-op Pain Management: MAC Combined w/ Regional for Post-op pain   Induction: Intravenous  Airway Management Planned: Oral ETT  Additional Equipment:   Intra-op Plan:   Post-operative Plan: Extubation in OR  Informed Consent: I have reviewed the patients History and Physical, chart, labs and discussed the procedure including the risks, benefits and alternatives for the proposed anesthesia with the patient or authorized representative who has indicated his/her understanding and acceptance.     Plan Discussed with: CRNA and Surgeon  Anesthesia Plan Comments: (Discussed Supraclavicular block with patient and wife, who agree. Arta Bruce MD)       Anesthesia Quick Evaluation

## 2011-10-22 NOTE — Consult Note (Addendum)
Anesthesia:  Patient is a 66 year old male posted for a ORIF of a right olecranon fracture on 10/23/11.  He will be a same day work-up.  History includes HTN, PAF, CVA, Parkinson's disease, gout, severe 3V CAD/NSTEMI 06/2011 (not felt to be a candidate for CABG), prior cardiac stent in 2000, skin CA, non-smoker.     His Cardiologist is Dr. Elease Hashimoto.  He was last seen by NP Norma Fredrickson on 10/15/11 for cardiac pre-evaluation.  He is felt to be at high risk from a cardiac standpoint, but the procedure is felt necessary.  He was noted to have preserved LV function and severe 3VCAD, but was asymptomatic.  His surgery was put off till this week so he could be off of his Plavix for at least five days.  EKG from that visit showed NSR, incomplete right BBB, inferior infarct, cannot rule out anterior infarct.  Echo from 06/26/11 showed mild LVH, normal LV systolic function, EF 55-60%, LA mildly dilated.  As mentioned, his cath from 06/25/11 showed severe 3V CAD with well-preserved LV systolic function.  (See report in Epic under Notes tab.)  A 1V CXR from 02/17/11 showed cardiac enlargement. No active cardiopulmonary disease.  Labs to be done on arrival.

## 2011-10-23 ENCOUNTER — Ambulatory Visit (HOSPITAL_COMMUNITY): Payer: Medicare HMO | Admitting: Vascular Surgery

## 2011-10-23 ENCOUNTER — Observation Stay (HOSPITAL_COMMUNITY)
Admission: RE | Admit: 2011-10-23 | Discharge: 2011-10-24 | DRG: 512 | Disposition: A | Discharge status: Home / Self Care | Payer: Medicare HMO | Source: Ambulatory Visit | Attending: Orthopedic Surgery | Admitting: Orthopedic Surgery

## 2011-10-23 ENCOUNTER — Encounter (HOSPITAL_COMMUNITY): Payer: Self-pay | Admitting: Orthopedic Surgery

## 2011-10-23 ENCOUNTER — Encounter (HOSPITAL_COMMUNITY): Payer: Self-pay | Admitting: Vascular Surgery

## 2011-10-23 ENCOUNTER — Ambulatory Visit (HOSPITAL_COMMUNITY): Admission: RE | Admit: 2011-10-23 | Payer: Medicare HMO | Source: Ambulatory Visit | Admitting: Orthopedic Surgery

## 2011-10-23 ENCOUNTER — Encounter (HOSPITAL_COMMUNITY): Payer: Self-pay | Admitting: *Deleted

## 2011-10-23 ENCOUNTER — Encounter (HOSPITAL_COMMUNITY): Admission: RE | Payer: Self-pay | Source: Ambulatory Visit

## 2011-10-23 ENCOUNTER — Encounter (HOSPITAL_COMMUNITY)
Admission: RE | Disposition: A | Discharge status: Home / Self Care | Payer: Self-pay | Source: Ambulatory Visit | Attending: Orthopedic Surgery

## 2011-10-23 DIAGNOSIS — W19XXXA Unspecified fall, initial encounter: Secondary | ICD-10-CM | POA: Insufficient documentation

## 2011-10-23 DIAGNOSIS — S52021A Displaced fracture of olecranon process without intraarticular extension of right ulna, initial encounter for closed fracture: Secondary | ICD-10-CM

## 2011-10-23 DIAGNOSIS — S52023A Displaced fracture of olecranon process without intraarticular extension of unspecified ulna, initial encounter for closed fracture: Principal | ICD-10-CM | POA: Insufficient documentation

## 2011-10-23 DIAGNOSIS — G20A1 Parkinson's disease without dyskinesia, without mention of fluctuations: Secondary | ICD-10-CM | POA: Insufficient documentation

## 2011-10-23 DIAGNOSIS — G20 Parkinson's disease: Secondary | ICD-10-CM | POA: Insufficient documentation

## 2011-10-23 DIAGNOSIS — I10 Essential (primary) hypertension: Secondary | ICD-10-CM | POA: Insufficient documentation

## 2011-10-23 HISTORY — DX: Unspecified malignant neoplasm of skin, unspecified: C44.90

## 2011-10-23 HISTORY — DX: Displaced fracture of olecranon process without intraarticular extension of right ulna, initial encounter for closed fracture: S52.021A

## 2011-10-23 LAB — CBC
Hemoglobin: 11.5 g/dL — ABNORMAL LOW (ref 13.0–17.0)
RBC: 3.74 MIL/uL — ABNORMAL LOW (ref 4.22–5.81)
WBC: 7.3 10*3/uL (ref 4.0–10.5)

## 2011-10-23 LAB — BASIC METABOLIC PANEL
BUN: 26 mg/dL — ABNORMAL HIGH (ref 6–23)
CO2: 28 mEq/L (ref 19–32)
Calcium: 9.5 mg/dL (ref 8.4–10.5)
GFR calc non Af Amer: 57 mL/min — ABNORMAL LOW (ref >90)
Glucose, Bld: 89 mg/dL (ref 70–99)

## 2011-10-23 SURGERY — OPEN REDUCTION INTERNAL FIXATION (ORIF) ELBOW/OLECRANON FRACTURE
Anesthesia: General | Site: Elbow | Laterality: Right | Wound class: Clean

## 2011-10-23 SURGERY — OPEN REDUCTION INTERNAL FIXATION (ORIF) ELBOW/OLECRANON FRACTURE
Anesthesia: General | Laterality: Right

## 2011-10-23 MED ORDER — FLUTICASONE PROPIONATE 0.05 % EX CREA: 1.0000 "application " | TOPICAL_CREAM | Freq: Two times a day (BID) | CUTANEOUS | Status: DC

## 2011-10-23 MED ORDER — CARBIDOPA-LEVODOPA 25-100 MG PO TABS
1.0000 | ORAL_TABLET | Freq: Three times a day (TID) | ORAL | Status: DC
  Filled 2011-10-23 (×2): qty 1

## 2011-10-23 MED ORDER — ZOLPIDEM TARTRATE 5 MG PO TABS: 5.0000 mg | ORAL_TABLET | Freq: Every evening | ORAL | Status: DC | PRN

## 2011-10-23 MED ORDER — CEFAZOLIN SODIUM 1-5 GM-% IV SOLN
INTRAVENOUS | Status: DC | PRN
  Administered 2011-10-23: 2 g via INTRAVENOUS

## 2011-10-23 MED ORDER — BUPIVACAINE-EPINEPHRINE 0.5% -1:200000 IJ SOLN
INTRAMUSCULAR | Status: DC | PRN
  Administered 2011-10-23: 10 mL

## 2011-10-23 MED ORDER — PROPOFOL 10 MG/ML IV BOLUS
INTRAVENOUS | Status: DC | PRN
  Administered 2011-10-23: 200 mg via INTRAVENOUS

## 2011-10-23 MED ORDER — OXYCODONE-ACETAMINOPHEN 5-325 MG PO TABS: 1.0000 | ORAL_TABLET | Freq: Four times a day (QID) | ORAL | Status: AC | PRN

## 2011-10-23 MED ORDER — ROSUVASTATIN CALCIUM 20 MG PO TABS
20.0000 mg | ORAL_TABLET | Freq: Every day | ORAL | Status: DC
  Administered 2011-10-23: 20 mg via ORAL
  Filled 2011-10-23 (×2): qty 1

## 2011-10-23 MED ORDER — METHOCARBAMOL 100 MG/ML IJ SOLN
500.0000 mg | Freq: Four times a day (QID) | INTRAVENOUS | Status: DC | PRN
  Filled 2011-10-23: qty 5

## 2011-10-23 MED ORDER — ASPIRIN EC 81 MG PO TBEC
81.0000 mg | DELAYED_RELEASE_TABLET | Freq: Every day | ORAL | Status: DC
  Administered 2011-10-24: 81 mg via ORAL
  Filled 2011-10-23: qty 1

## 2011-10-23 MED ORDER — ONDANSETRON HCL 4 MG PO TABS: 4.0000 mg | ORAL_TABLET | Freq: Four times a day (QID) | ORAL | Status: DC | PRN

## 2011-10-23 MED ORDER — EPHEDRINE SULFATE 50 MG/ML IJ SOLN
INTRAMUSCULAR | Status: DC | PRN
  Administered 2011-10-23 (×2): 10 mg via INTRAVENOUS

## 2011-10-23 MED ORDER — METOCLOPRAMIDE HCL 5 MG PO TABS
5.0000 mg | ORAL_TABLET | Freq: Three times a day (TID) | ORAL | Status: DC | PRN
  Filled 2011-10-23: qty 2

## 2011-10-23 MED ORDER — NITROGLYCERIN 0.4 MG SL SUBL: 0.4000 mg | SUBLINGUAL_TABLET | SUBLINGUAL | Status: DC | PRN

## 2011-10-23 MED ORDER — METOCLOPRAMIDE HCL 5 MG/ML IJ SOLN
5.0000 mg | Freq: Three times a day (TID) | INTRAMUSCULAR | Status: DC | PRN
  Filled 2011-10-23: qty 2

## 2011-10-23 MED ORDER — PROMETHAZINE HCL 25 MG PO TABS: 25.0000 mg | ORAL_TABLET | Freq: Four times a day (QID) | ORAL | Status: AC | PRN

## 2011-10-23 MED ORDER — CLOPIDOGREL BISULFATE 75 MG PO TABS
75.0000 mg | ORAL_TABLET | Freq: Every day | ORAL | Status: DC
  Administered 2011-10-24: 75 mg via ORAL
  Filled 2011-10-23: qty 1

## 2011-10-23 MED ORDER — HYDROCHLOROTHIAZIDE 12.5 MG PO CAPS
12.5000 mg | ORAL_CAPSULE | Freq: Every day | ORAL | Status: DC
  Administered 2011-10-24: 12.5 mg via ORAL
  Filled 2011-10-23: qty 1

## 2011-10-23 MED ORDER — ACETAMINOPHEN 650 MG RE SUPP: 650.0000 mg | Freq: Four times a day (QID) | RECTAL | Status: DC | PRN

## 2011-10-23 MED ORDER — CLOBETASOL PROPIONATE 0.05 % EX SOLN: 1.0000 "application " | CUTANEOUS | Status: DC | PRN

## 2011-10-23 MED ORDER — CARVEDILOL 3.125 MG PO TABS
3.1250 mg | ORAL_TABLET | Freq: Two times a day (BID) | ORAL | Status: DC
  Administered 2011-10-24: 3.125 mg via ORAL
  Filled 2011-10-23 (×4): qty 1

## 2011-10-23 MED ORDER — DOCUSATE SODIUM 100 MG PO CAPS
100.0000 mg | ORAL_CAPSULE | Freq: Two times a day (BID) | ORAL | Status: DC
Start: 2011-10-23 — End: 2011-10-24
  Administered 2011-10-23 – 2011-10-24 (×2): 100 mg via ORAL
  Filled 2011-10-23 (×3): qty 1

## 2011-10-23 MED ORDER — ONDANSETRON HCL 4 MG/2ML IJ SOLN: 4.0000 mg | Freq: Four times a day (QID) | INTRAMUSCULAR | Status: DC | PRN

## 2011-10-23 MED ORDER — LACTATED RINGERS IV SOLN
INTRAVENOUS | Status: DC
  Administered 2011-10-23 (×2): via INTRAVENOUS

## 2011-10-23 MED ORDER — HYDROMORPHONE HCL PF 1 MG/ML IJ SOLN: 0.5000 mg | INTRAMUSCULAR | Status: DC | PRN

## 2011-10-23 MED ORDER — POTASSIUM CHLORIDE IN NACL 20-0.45 MEQ/L-% IV SOLN
INTRAVENOUS | Status: DC
  Administered 2011-10-23: 22:00:00 via INTRAVENOUS
  Filled 2011-10-23 (×3): qty 1000

## 2011-10-23 MED ORDER — OXYCODONE HCL 5 MG PO TABS
5.0000 mg | ORAL_TABLET | ORAL | Status: DC | PRN
  Administered 2011-10-24 (×4): 5 mg via ORAL
  Filled 2011-10-23 (×4): qty 1

## 2011-10-23 MED ORDER — FENTANYL CITRATE 0.05 MG/ML IJ SOLN
INTRAMUSCULAR | Status: AC
  Filled 2011-10-23: qty 2

## 2011-10-23 MED ORDER — 0.9 % SODIUM CHLORIDE (POUR BTL) OPTIME
TOPICAL | Status: DC | PRN
  Administered 2011-10-23: 1000 mL

## 2011-10-23 MED ORDER — FLUOCINONIDE 0.05 % EX CREA
TOPICAL_CREAM | Freq: Two times a day (BID) | CUTANEOUS | Status: DC
  Filled 2011-10-23: qty 30

## 2011-10-23 MED ORDER — ONDANSETRON HCL 4 MG/2ML IJ SOLN
INTRAMUSCULAR | Status: DC | PRN
  Administered 2011-10-23: 4 mg via INTRAVENOUS

## 2011-10-23 MED ORDER — METHOCARBAMOL 500 MG PO TABS
500.0000 mg | ORAL_TABLET | Freq: Four times a day (QID) | ORAL | Status: DC | PRN
  Filled 2011-10-23: qty 1

## 2011-10-23 MED ORDER — MENTHOL 3 MG MT LOZG
1.0000 | LOZENGE | OROMUCOSAL | Status: DC | PRN
  Filled 2011-10-23: qty 9

## 2011-10-23 MED ORDER — FENTANYL CITRATE 0.05 MG/ML IJ SOLN
50.0000 ug | INTRAMUSCULAR | Status: DC | PRN
  Administered 2011-10-23: 100 ug via INTRAVENOUS

## 2011-10-23 MED ORDER — HYDROMORPHONE HCL PF 1 MG/ML IJ SOLN: 0.2500 mg | INTRAMUSCULAR | Status: DC | PRN

## 2011-10-23 MED ORDER — CEFAZOLIN SODIUM-DEXTROSE 2-3 GM-% IV SOLR
2.0000 g | Freq: Four times a day (QID) | INTRAVENOUS | Status: AC
  Administered 2011-10-23 – 2011-10-24 (×3): 2 g via INTRAVENOUS
  Filled 2011-10-23 (×3): qty 50

## 2011-10-23 MED ORDER — ENOXAPARIN SODIUM 40 MG/0.4ML ~~LOC~~ SOLN
40.0000 mg | SUBCUTANEOUS | Status: DC
  Administered 2011-10-23: 40 mg via SUBCUTANEOUS
  Filled 2011-10-23 (×2): qty 0.4

## 2011-10-23 MED ORDER — ACETAMINOPHEN 325 MG PO TABS: 650.0000 mg | ORAL_TABLET | Freq: Four times a day (QID) | ORAL | Status: DC | PRN

## 2011-10-23 MED ORDER — PHENOL 1.4 % MT LIQD
1.0000 | OROMUCOSAL | Status: DC | PRN
  Filled 2011-10-23: qty 177

## 2011-10-23 MED ORDER — MUPIROCIN 2 % EX OINT
TOPICAL_OINTMENT | CUTANEOUS | Status: AC
  Administered 2011-10-23: 1 via NASAL
  Filled 2011-10-23: qty 22

## 2011-10-23 MED ORDER — DOCUSATE SODIUM 100 MG PO CAPS: 100.0000 mg | ORAL_CAPSULE | Freq: Two times a day (BID) | ORAL | Status: DC

## 2011-10-23 MED ORDER — FENTANYL CITRATE 0.05 MG/ML IJ SOLN
INTRAMUSCULAR | Status: DC | PRN
  Administered 2011-10-23: 100 ug via INTRAVENOUS
  Administered 2011-10-23 (×3): 50 ug via INTRAVENOUS

## 2011-10-23 MED ORDER — CEFAZOLIN SODIUM 1-5 GM-% IV SOLN
INTRAVENOUS | Status: AC
  Filled 2011-10-23: qty 100

## 2011-10-23 MED ORDER — LEVETIRACETAM 250 MG PO TABS
250.0000 mg | ORAL_TABLET | Freq: Two times a day (BID) | ORAL | Status: DC
  Administered 2011-10-23 – 2011-10-24 (×2): 250 mg via ORAL
  Filled 2011-10-23 (×3): qty 1

## 2011-10-23 MED ORDER — AMLODIPINE BESYLATE 2.5 MG PO TABS
2.5000 mg | ORAL_TABLET | Freq: Every day | ORAL | Status: DC
  Administered 2011-10-24: 2.5 mg via ORAL
  Filled 2011-10-23: qty 1

## 2011-10-23 MED ORDER — MUPIROCIN CALCIUM 2 % EX CREA
1.0000 "application " | TOPICAL_CREAM | Freq: Every day | CUTANEOUS | Status: DC | PRN
  Filled 2011-10-23: qty 15

## 2011-10-23 MED ORDER — SENNA 8.6 MG PO TABS
1.0000 | ORAL_TABLET | Freq: Two times a day (BID) | ORAL | Status: DC
  Administered 2011-10-23 – 2011-10-24 (×2): 8.6 mg via ORAL
  Filled 2011-10-23 (×3): qty 1

## 2011-10-23 MED ORDER — POLYETHYLENE GLYCOL 3350 17 G PO PACK
17.0000 g | PACK | Freq: Every day | ORAL | Status: DC | PRN
  Filled 2011-10-23: qty 1

## 2011-10-23 MED ORDER — VALSARTAN-HYDROCHLOROTHIAZIDE 160-12.5 MG PO TABS: 1.0000 | ORAL_TABLET | Freq: Every day | ORAL | Status: DC

## 2011-10-23 MED ORDER — LIDOCAINE HCL (CARDIAC) 20 MG/ML IV SOLN
INTRAVENOUS | Status: DC | PRN
  Administered 2011-10-23: 40 mg via INTRAVENOUS

## 2011-10-23 MED ORDER — METOCLOPRAMIDE HCL 5 MG/ML IJ SOLN
INTRAMUSCULAR | Status: DC | PRN
  Administered 2011-10-23: 10 mg via INTRAVENOUS

## 2011-10-23 MED ORDER — OLMESARTAN MEDOXOMIL 20 MG PO TABS
20.0000 mg | ORAL_TABLET | Freq: Every day | ORAL | Status: DC
  Administered 2011-10-24: 20 mg via ORAL
  Filled 2011-10-23: qty 1

## 2011-10-23 MED ORDER — OXYCODONE-ACETAMINOPHEN 5-325 MG PO TABS
1.0000 | ORAL_TABLET | ORAL | Status: DC | PRN
  Administered 2011-10-24 (×2): 1 via ORAL
  Filled 2011-10-23 (×2): qty 1

## 2011-10-23 MED ORDER — FLUOCINONIDE 0.05 % EX OINT
TOPICAL_OINTMENT | Freq: Two times a day (BID) | CUTANEOUS | Status: DC
  Administered 2011-10-23: 22:00:00 via TOPICAL
  Filled 2011-10-23: qty 15

## 2011-10-23 MED ORDER — DEXAMETHASONE SODIUM PHOSPHATE 4 MG/ML IJ SOLN
INTRAMUSCULAR | Status: DC | PRN
  Administered 2011-10-23: 4 mg via INTRAVENOUS

## 2011-10-23 MED ORDER — ROPIVACAINE HCL 5 MG/ML IJ SOLN
INTRAMUSCULAR | Status: DC | PRN
  Administered 2011-10-23: 30 mL via EPIDURAL

## 2011-10-23 SURGICAL SUPPLY — 85 items
2.0 K-WIRE ×1 IMPLANT
APL SKNCLS STERI-STRIP NONHPOA (GAUZE/BANDAGES/DRESSINGS) ×1
BANDAGE ACE 4 STERILE (GAUZE/BANDAGES/DRESSINGS) ×1 IMPLANT
BANDAGE ELASTIC 3 VELCRO ST LF (GAUZE/BANDAGES/DRESSINGS) ×1 IMPLANT
BANDAGE ELASTIC 4 VELCRO ST LF (GAUZE/BANDAGES/DRESSINGS) ×1 IMPLANT
BENZOIN TINCTURE PRP APPL 2/3 (GAUZE/BANDAGES/DRESSINGS) ×2 IMPLANT
BIT DRILL 2.0 LNG QUCK RELEASE (BIT) IMPLANT
BIT DRILL 2.8 QUICK RELEASE (BIT) IMPLANT
BNDG CMPR 9X4 STRL LF SNTH (GAUZE/BANDAGES/DRESSINGS) ×1
BNDG CMPR MD 5X2 ELC HKLP STRL (GAUZE/BANDAGES/DRESSINGS)
BNDG COHESIVE 4X5 TAN STRL (GAUZE/BANDAGES/DRESSINGS) ×1 IMPLANT
BNDG ELASTIC 2 VLCR STRL LF (GAUZE/BANDAGES/DRESSINGS) ×1 IMPLANT
BNDG ESMARK 4X9 LF (GAUZE/BANDAGES/DRESSINGS) ×2 IMPLANT
CANISTER SUCTION 2500CC (MISCELLANEOUS) ×1 IMPLANT
CLOSURE STERI STRIP 1/2 X4 (GAUZE/BANDAGES/DRESSINGS) ×1 IMPLANT
CLOTH BEACON ORANGE TIMEOUT ST (SAFETY) ×2 IMPLANT
COVER SURGICAL LIGHT HANDLE (MISCELLANEOUS) ×2 IMPLANT
CUFF TOURNIQUET SINGLE 18IN (TOURNIQUET CUFF) ×1 IMPLANT
CUFF TOURNIQUET SINGLE 24IN (TOURNIQUET CUFF) IMPLANT
DRAPE C-ARM 42X72 X-RAY (DRAPES) ×1 IMPLANT
DRAPE INCISE IOBAN 66X45 STRL (DRAPES) ×2 IMPLANT
DRAPE OEC MINIVIEW 54X84 (DRAPES) ×1 IMPLANT
DRAPE U-SHAPE 47X51 STRL (DRAPES) ×3 IMPLANT
DRILL 2.0 LNG QUICK RELEASE (BIT) ×2
DRILL 2.8 QUICK RELEASE (BIT) ×2
DURAPREP 26ML APPLICATOR (WOUND CARE) ×2 IMPLANT
ELECT REM PT RETURN 9FT ADLT (ELECTROSURGICAL) ×2
ELECTRODE REM PT RTRN 9FT ADLT (ELECTROSURGICAL) ×1 IMPLANT
GAUZE SPONGE 4X4 12PLY STRL LF (GAUZE/BANDAGES/DRESSINGS) ×1 IMPLANT
GAUZE XEROFORM 1X8 LF (GAUZE/BANDAGES/DRESSINGS) ×1 IMPLANT
GAUZE XEROFORM 5X9 LF (GAUZE/BANDAGES/DRESSINGS) ×1 IMPLANT
GLOVE BIO SURGEON STRL SZ 6.5 (GLOVE) ×4 IMPLANT
GLOVE BIOGEL PI IND STRL 8 (GLOVE) ×1 IMPLANT
GLOVE BIOGEL PI INDICATOR 8 (GLOVE) ×2
GLOVE ORTHO TXT STRL SZ7.5 (GLOVE) ×2 IMPLANT
GLOVE SURG ORTHO 8.0 STRL STRW (GLOVE) ×4 IMPLANT
GOWN PREVENTION PLUS XXLARGE (GOWN DISPOSABLE) ×2 IMPLANT
GOWN STRL NON-REIN LRG LVL3 (GOWN DISPOSABLE) ×1 IMPLANT
GOWN STRL REIN 2XL LVL4 (GOWN DISPOSABLE) ×1 IMPLANT
GUIDEWIRE ORTHO 2.0X9 ST (WIRE) ×1 IMPLANT
KIT BASIN OR (CUSTOM PROCEDURE TRAY) ×2 IMPLANT
KIT ROOM TURNOVER OR (KITS) ×2 IMPLANT
NDL HYPO 25X1 1.5 SAFETY (NEEDLE) IMPLANT
NEEDLE HYPO 25X1 1.5 SAFETY (NEEDLE) ×2 IMPLANT
NS IRRIG 1000ML POUR BTL (IV SOLUTION) ×2 IMPLANT
PACK ORTHO EXTREMITY (CUSTOM PROCEDURE TRAY) ×2 IMPLANT
PAD ARMBOARD 7.5X6 YLW CONV (MISCELLANEOUS) ×4 IMPLANT
PAD CAST 3X4 CTTN HI CHSV (CAST SUPPLIES) ×1 IMPLANT
PAD CAST 4YDX4 CTTN HI CHSV (CAST SUPPLIES) ×1 IMPLANT
PADDING CAST ABS 4INX4YD NS (CAST SUPPLIES)
PADDING CAST ABS COTTON 4X4 ST (CAST SUPPLIES) ×1 IMPLANT
PADDING CAST COTTON 3X4 STRL (CAST SUPPLIES)
PADDING CAST COTTON 4X4 STRL (CAST SUPPLIES)
PADDING UNDERCAST 2  STERILE (CAST SUPPLIES) ×1 IMPLANT
PADDING WEBRIL 4 STERILE (GAUZE/BANDAGES/DRESSINGS) ×1 IMPLANT
PLATE OLECRANON 5 HOLE (Plate) ×1 IMPLANT
SCREW HEX NON LOCK 3.5X26MM (Screw) ×1 IMPLANT
SCREW HEXALOBE LOCK 3.5X50MM (Screw) ×1 IMPLANT
SCREW LOCK 18X2.7X HEXALOBE (Screw) IMPLANT
SCREW LOCK 20X2.7X HEXALOBE (Screw) IMPLANT
SCREW LOCKING 2.7X18MM (Screw) ×4 IMPLANT
SCREW LOCKING 2.7X20MM (Screw) ×4 IMPLANT
SCREW NON LOCKING HEX 3.5X18MM (Screw) ×1 IMPLANT
SCREW NON LOCKING HEX 3.5X30 (Screw) ×1 IMPLANT
SPLINT FAST PLASTER 5X30 (CAST SUPPLIES) ×1
SPLINT PLASTER CAST FAST 5X30 (CAST SUPPLIES) IMPLANT
SPONGE GAUZE 4X4 12PLY (GAUZE/BANDAGES/DRESSINGS) ×1 IMPLANT
STOCKINETTE IMPERVIOUS 9X36 MD (GAUZE/BANDAGES/DRESSINGS) ×1 IMPLANT
STRIP CLOSURE SKIN 1/2X4 (GAUZE/BANDAGES/DRESSINGS) ×3 IMPLANT
SUT ETHIBOND 0 MO6 C/R (SUTURE) IMPLANT
SUT ETHILON 3 0 PS 1 (SUTURE) IMPLANT
SUT ETHILON 4 0 PS 2 18 (SUTURE) IMPLANT
SUT MNCRL AB 4-0 PS2 18 (SUTURE) ×1 IMPLANT
SUT VIC AB 0 CT1 27 (SUTURE) ×2
SUT VIC AB 0 CT1 27XBRD ANBCTR (SUTURE) ×1 IMPLANT
SUT VIC AB 3-0 SH 18 (SUTURE) ×1 IMPLANT
SUT VIC AB 3-0 SH 27 (SUTURE)
SUT VIC AB 3-0 SH 27X BRD (SUTURE) ×1 IMPLANT
SYR CONTROL 10ML LL (SYRINGE) ×1 IMPLANT
TOWEL OR 17X24 6PK STRL BLUE (TOWEL DISPOSABLE) ×2 IMPLANT
TOWEL OR 17X26 10 PK STRL BLUE (TOWEL DISPOSABLE) ×2 IMPLANT
TUBE CONNECTING 12X1/4 (SUCTIONS) ×2 IMPLANT
TUBE SUCT ARGYLE STRL (TUBING) ×2 IMPLANT
UNDERPAD 30X30 INCONTINENT (UNDERPADS AND DIAPERS) ×1 IMPLANT
WATER STERILE IRR 1000ML POUR (IV SOLUTION) ×1 IMPLANT

## 2011-10-23 NOTE — Preoperative (Signed)
Beta Blockers   Reason not to administer Beta Blockers:Not Applicable 

## 2011-10-23 NOTE — Transfer of Care (Signed)
Immediate Anesthesia Transfer of Care Note  Patient: Mark [name]  Procedure(s) Performed: Procedure(s) (LRB): OPEN REDUCTION INTERNAL FIXATION (ORIF) ELBOW/OLECRANON FRACTURE (Right)  Patient Location: PACU  Anesthesia Type: General  Level of Consciousness: oriented and sedated  Airway & Oxygen Therapy: Patient Spontanous Breathing and Patient connected to nasal cannula oxygen  Post-op Assessment: Report given to PACU RN and Post -op Vital signs reviewed and stable  Post vital signs: stable  Complications: No apparent anesthesia complications

## 2011-10-23 NOTE — Progress Notes (Signed)
SPOKE WITH ALLISON ZELENAK PA RE PATIENT HAVING 1 VIEW CXR 02/2011  AND SPOUSE OF PATIENT STATED IT WAS DIFFICULT FOR PATIENT TO GET AROUND. ALLISON STATED 1 VIEW WAS OKAY.

## 2011-10-23 NOTE — Progress Notes (Signed)
NOTIFIED DR  Ladene Artist OF PATIENT HAVING PIECE OF TOAST AROUND 700 AM, WHICH WIFE STATES SHE TOOK AWAY AS SOON AS SHE HEARD DR LANDAUS MSG OF ONLY HAVING CLEAR LIQUIDS.  SPOKE WITH [facility]ING AREA NURSE RE:  PATIENT NEEDING TO SIGN CONSENT STILL BUT WE DO NOT HAVE ORDERS, SO IT WILL NEEDS SIGNED IN OR HOLDING BY WIFE OF PATIENT. (PT HAS PARKINSONS AND HE IS UNABLE TO WRITE WITH RT HAND DUE TO CURRENT CONDITION.)

## 2011-10-23 NOTE — Op Note (Signed)
10/23/2011  5:01 PM  PATIENT:  Mark [name]    PRE-OPERATIVE DIAGNOSIS:  right elbow olecranon fracture  POST-OPERATIVE DIAGNOSIS:  Same  PROCEDURE:  OPEN REDUCTION INTERNAL FIXATION (ORIF) ELBOW/OLECRANON FRACTURE  SURGEON:  Eulas Post, MD  PHYSICIAN ASSISTANT: Janace Litten, OPA-C, present and scrubbed throughout the case, critical for completion in a timely fashion, and for retraction, instrumentation, and closure.  ANESTHESIA:   General  PREOPERATIVE INDICATIONS:  [name] is a  67 y.o. male with a diagnosis of right elbow olecranon fracture who elected for surgical management, despite his high risk for surgery from his cardiac disease, and also his limited preoperative function in the right upper extremity due to his severe parkinsons disorder.  The risks benefits and alternatives were discussed with the patient including but not limited to the risks of nonoperative treatment, versus surgical intervention including infection, bleeding, nerve injury, malunion, nonunion, the need for revision surgery, hardware prominence, hardware failure, the need for hardware removal, blood clots, cardiopulmonary complications, morbidity, mortality, among others, and they were willing to proceed.  Predicted outcome is good, although there will be at least a six to nine month expected recovery.    OPERATIVE IMPLANTS: accumed proximal ulnar locking plate  OPERATIVE FINDINGS: osteopenia  OPERATIVE PROCEDURE: The patient was brought to the operating room and placed in the supine position.  General anesthesia was administered, and IV ancef was given.  Time out was performed and the right upper extremity was prepped and draped in the usual sterile fashion and tourniquet was utilized at 250 mmHg.  He was kept supine and the arm supported on a surefoot post.    Posterior incision was made avoiding his abrasions and the fracture identified and cleaned.  The ulnar nerve was protected and not  exposed.  The fracture was held anatomically with a clamp and provisionally pinned in place.  I then applied the precontoured olecranon plate into the appropriate position. C-arm was used to confirm reduction and plate position.  The plate was secured with distal interlocking screws first in the sliding hole and then the fracture was fixed proximally with unicortical locking screws.  The distal screws were also placed for added fixation.  The distal portion of the plate was hanging slightly over the ulnar side of the bone, although this was not appreciated until all fixation was in place, and I had fixation with all screws, so the slightly oblique position was accepted.  The elbow articular reduction was congruent on c-arm and moved as unit.  The tissue was closed with vicryl and steri-strips and sterile gauze followed by a posterior splint.  The tourniquet was released and he was awakened and returned to the PACU in stable and satisfactory condition.  There were no complications and he tolerated the procedure well.  He will be admitted overnight to telemetry due to his cardiac risk factors, and then discharged home tomorrow if pain adequately controlled.

## 2011-10-23 NOTE — H&P (Signed)
PREOPERATIVE H&P  Chief Complaint: right elbow olecranon fracture  HPI: Mark [name] is a 66 y.o. male who presents for preoperative history and physical with a diagnosis of right elbow olecranon fracture. Symptoms are rated as moderate to severe, and have been worsening.  He has substantial dysfunction at baseline secondary to his severe Parkinson's disorder. He is able to use the right arm, but only for very limited activities. This was prior to his accident.  He has elected for surgical management. We had a long discussion regarding nonsurgical management, and the patient clearly expressed his desire for surgical intervention. He did so recognizing the risk of both for morbidity, failure, as well as mortality, particularly in light of his pre-existing risk factors. The question was particularly relevant because of the limited function that he had pre-injury, however he says if there is any hope of regaining any type of function in his arm, he wants surgery, and he also indicated that if there is any hope that surgical intervention would reduce pain, then this is his desire.  Past Medical History  Diagnosis Date  . Hypertension   . PAF (paroxysmal atrial fibrillation)   . Parkinson's disease   . Stroke     Hx of remote stroke  . Gout   . Non-ST elevated myocardial infarction (non-STEMI)   . Coronary artery disease     SEVERE 3-VESSEL DX WITH NORMAL EF  . Fall   . Skin cancer     left shoulder   Past Surgical History  Procedure Date  . Coronary stent placement 2000  . Cardiac catheterization 06/26/2011    NORMAL LV SYSTOLIC FUNCTION. RCA IS LARGE AND DOMINANT  . Transthoracic echocardiogram 06/26/2011    EF 55-60%   History   Social History  . Marital Status: Married    Spouse Name: N/A    Number of Children: N/A  . Years of Education: N/A   Social History Main Topics  . Smoking status: Never Smoker   . Smokeless tobacco: None  . Alcohol Use: No  . Drug Use: No  .  Sexually Active: No   Other Topics Concern  . None   Social History Narrative  . None   Family History  Problem Relation Age of Onset  . Heart disease    . Arthritis     No Known Allergies Prior to Admission medications   Medication Sig Start Date End Date Taking? Authorizing Provider  amLODipine (NORVASC) 5 MG tablet Take 2.5 mg by mouth daily. 10/15/11  Yes Rosalio Macadamia, NP  aspirin EC 81 MG tablet Take 81 mg by mouth daily.     Yes Historical Provider, MD  BACTROBAN 2 % Apply 1 application topically daily as needed.  07/23/11  Yes Historical Provider, MD  carbidopa-levodopa (SINEMET) 25-100 MG per tablet Take 1 tablet by mouth Three times a day. 09/23/11  Yes Historical Provider, MD  carvedilol (COREG) 3.125 MG tablet Take 3.125 mg by mouth 2 (two) times daily with a meal.     Yes Historical Provider, MD  clobetasol (TEMOVATE) 0.05 % external solution Apply 1 application topically every other day as needed. For scalp   Yes Historical Provider, MD  docusate sodium (COLACE) 100 MG capsule Take 100 mg by mouth 2 (two) times daily.   Yes Historical Provider, MD  fluticasone (CUTIVATE) 0.05 % cream Apply 1 application topically 2 (two) times daily as needed. Apply to affected areas on face   Yes Historical Provider, MD  HYDROcodone-acetaminophen (  NORCO) 7.5-325 MG per tablet Take 1 tablet by mouth every 4 (four) hours as needed for pain. 10/14/11 10/24/11 Yes Fuller Canada, MD  levETIRAcetam (KEPPRA) 250 MG tablet Take 250 mg by mouth Twice daily.  09/23/11  Yes Historical Provider, MD  nitroGLYCERIN (NITROSTAT) 0.4 MG SL tablet Place 0.4 mg under the tongue every 5 (five) minutes as needed. For chest pain   Yes Historical Provider, MD  rosuvastatin (CRESTOR) 20 MG tablet Take 20 mg by mouth daily.     Yes Historical Provider, MD  valsartan-hydrochlorothiazide (DIOVAN-HCT) 160-12.5 MG per tablet Take 1 tablet by mouth daily.    Yes Historical Provider, MD  clopidogrel (PLAVIX) 75 MG tablet  Take 75 mg by mouth Daily. 09/23/11   Historical Provider, MD     Positive ROS: All other systems have been reviewed and were otherwise negative with the exception of those mentioned in the HPI and as above.  Physical Exam: General: Alert, no acute distress, characteristic features of Parkinson's noted with masked facies. Cardiovascular: No pedal edema Respiratory: No cyanosis, no use of accessory musculature GI: No organomegaly, abdomen is soft and non-tender Skin: He has a small abrasion over his ulnar, but no evidence for open fracture. Neurologic: Sensation intact distally, and has grossly abnormal neurologic exam with abnormal tone, cogwheel rigidity, and characteristic findings for Parkinson's disorder. Psychiatric: Patient is competent for consent with normal mood and affect Lymphatic: No axillary or cervical lymphadenopathy  MUSCULOSKELETAL: Right upper extremity has substantial swelling, ecchymosis, and pain to palpation with no ability to actively extend.  Assessment: right elbow olecranon fracture  Plan: Plan for Procedure(s): OPEN REDUCTION INTERNAL FIXATION (ORIF) ELBOW/OLECRANON FRACTURE  The risks benefits and alternatives were discussed with the patient including but not limited to the risks of nonoperative treatment, versus surgical intervention including infection, bleeding, nerve injury, malunion, nonunion, the need for revision surgery, hardware prominence, hardware failure, the need for hardware removal, blood clots, cardiopulmonary complications, morbidity, mortality, among others, and they were willing to proceed.  Predicted outcome is good, although there will be at least a six to nine month expected recovery.    Eulas Post, MD 10/23/2011 7:36 AM

## 2011-10-23 NOTE — Anesthesia Procedure Notes (Signed)
Anesthesia Regional Block:  Supraclavicular block  Pre-Anesthetic Checklist: ,, timeout performed, Correct Patient, Correct Site, Correct Laterality, Correct Procedure, Correct Position, site marked, Risks and benefits discussed,  Surgical consent,  Pre-op evaluation,  At surgeon's request and post-op pain management  Laterality: Right  Prep: chloraprep       Needles:  Injection technique: Single-shot  Needle Type: Echogenic Stimulator Needle     Needle Length: 5cm 5 cm Needle Gauge: 21 G    Additional Needles:  Procedures: ultrasound guided and nerve stimulator Supraclavicular block  Nerve Stimulator or Paresthesia:  Response: 0.4 mA,   Additional Responses:   Narrative:  Start time: 10/23/2011 2:45 PM End time: 10/23/2011 3:15 PM Injection made incrementally with aspirations every 5 mL.  Performed by: Personally  Anesthesiologist: Arta Bruce MD  Additional Notes: Monitors applied. Patient sedated. Sterile prep and drape,hand hygiene and sterile gloves were used. Relevant anatomy identified.Needle position confirmed.Local anesthetic injected incrementally after negative aspiration. Local anesthetic spread visualized around nerve(s). Vascular puncture avoided. No complications. Image printed for medical record.The patient tolerated the procedure well.       Supraclavicular block

## 2011-10-23 NOTE — Anesthesia Postprocedure Evaluation (Signed)
Anesthesia Post Note  Patient: Mark [name]  Procedure(s) Performed: Procedure(s) (LRB): OPEN REDUCTION INTERNAL FIXATION (ORIF) ELBOW/OLECRANON FRACTURE (Right)  Anesthesia type: general  Patient location: PACU  Post pain: Pain level controlled  Post assessment: Patient's Cardiovascular Status Stable  Last Vitals:  Filed Vitals:   10/23/11 1800  BP: 155/81  Pulse: 98  Temp:   Resp: 16    Post vital signs: Reviewed and stable  Level of consciousness: sedated  Complications: No apparent anesthesia complications

## 2011-10-24 LAB — BASIC METABOLIC PANEL
CO2: 27 mEq/L (ref 19–32)
Chloride: 103 mEq/L (ref 96–112)
Creatinine, Ser: 1.19 mg/dL (ref 0.50–1.35)
Sodium: 137 mEq/L (ref 135–145)

## 2011-10-24 LAB — CBC
Hemoglobin: 10.6 g/dL — ABNORMAL LOW (ref 13.0–17.0)
MCV: 88 fL (ref 78.0–100.0)
Platelets: 301 10*3/uL (ref 150–400)
RBC: 3.49 MIL/uL — ABNORMAL LOW (ref 4.22–5.81)
WBC: 10.7 10*3/uL — ABNORMAL HIGH (ref 4.0–10.5)

## 2011-10-24 NOTE — Progress Notes (Signed)
Orthopedic Tech Progress Note Patient Details:  ADMIR CANDELAS 1945-09-11 [id number]  Other Ortho Devices Type of Ortho Device: Other (comment) Ortho Device Location: sling immobilizer to right arm Ortho Device Interventions: Application   Gaye Pollack 10/24/2011, 10:57 AM

## 2011-10-24 NOTE — Progress Notes (Signed)
   CARE MANAGEMENT NOTE 10/24/2011  Patient:  ADEM, COSTLOW   Account Number:  [id number]  Date Initiated:  10/24/2011  Documentation initiated by:  [facility]  Subjective/Objective Assessment:   falls, OPEN REDUCTION INTERNAL FIXATION (ORIF) ELBOW/OLECRANON FRACTURE     Action/Plan:   lives at home with wife   Anticipated DC Date:  10/24/2011   Anticipated DC Plan:  HOME W HOME HEALTH SERVICES      DC Planning Services  CM consult      [facility] Choice  HOME HEALTH   Choice offered to / List presented to:  C-3 Spouse   DME arranged  3-N-1      DME agency  [facility]     [facility] arranged  HH-3 OT  HH-2 PT  HH-1 RN      [facility] agency  [facility]   Status of service:  Completed, signed off Medicare Important Message given?   (If response is "NO", the following Medicare IM given date fields will be blank) Date Medicare IM given:   Date Additional Medicare IM given:    Discharge Disposition:  HOME W HOME HEALTH SERVICES  Per UR Regulation:    Comments:  10/24/2011 1200 Spoke to wife, states he does not need RW. Does need 3n1 for home. Contacted [facility] for DME for scheduled d/c home today. Contacted [facility] for resumption of care and [facility] RN added to services. Faxed order, facesheet, and d/c summary. Isidoro Donning RN CCM Case Mgmt phone [phone]

## 2011-10-24 NOTE — Evaluation (Signed)
Physical Therapy Evaluation Patient Details Name: Mark [name] MRN: [id number] DOB: 06-13-1946 Today's Date: 10/24/2011  Problem List:  Patient Active Problem List  Diagnoses  . CAD (coronary artery disease)  . HTN (hypertension)  . Parkinson disease  . Fall  . Pre-operative clearance  . Olecranon fracture  . Fracture of right olecranon process    Past Medical History:  Past Medical History  Diagnosis Date  . Hypertension   . PAF (paroxysmal atrial fibrillation)   . Parkinson's disease   . Stroke     Hx of remote stroke  . Gout   . Non-ST elevated myocardial infarction (non-STEMI)   . Coronary artery disease     SEVERE 3-VESSEL DX WITH NORMAL EF  . Fall   . Skin cancer     left shoulder  . Fracture of right olecranon process 10/23/2011   Past Surgical History:  Past Surgical History  Procedure Date  . Coronary stent placement 2000  . Cardiac catheterization 06/26/2011    NORMAL LV SYSTOLIC FUNCTION. RCA IS LARGE AND DOMINANT  . Transthoracic echocardiogram 06/26/2011    EF 55-60%    PT Assessment/Plan/Recommendation PT Assessment Clinical Impression Statement: pt is a 66 y/o male s/p ORIF of the elbow.  Limiting factors include NWB of Right UE and Parkinsons which hinder his independence with mobility.  Pt. can benefit from Pt  at home to help improve gait stability and functional independence with bed mobility and transfers. PT Recommendation/Assessment: Patient will need skilled PT in the acute care venue PT Problem List: Decreased strength;Decreased activity tolerance;Decreased balance;Decreased mobility PT Therapy Diagnosis : Generalized weakness;Acute pain;Difficulty walking PT Plan PT Treatment/Interventions: Gait training;Stair training;Functional mobility training;Therapeutic activities;Therapeutic exercise;Balance training;Patient/family education PT Recommendation Follow Up Recommendations: Home health PT Equipment Recommended: 3 in 1 bedside  comode PT Goals     PT Evaluation Precautions/Restrictions  Precautions Precautions: Fall Required Braces or Orthoses: Yes Other Brace/Splint: arm sling for comfort Restrictions Weight Bearing Restrictions: Yes RUE Weight Bearing: Non weight bearing Prior Functioning  Home Living Lives With: Spouse Receives Help From: Family;Other (Comment) (daughter days/wife nights) Type of Home: House Home Layout: Two level;Able to live on main level with bedroom/bathroom Home Access: Stairs to enter Entrance Stairs-Rails: Can reach both Entrance Stairs-Number of Steps: 5 Bathroom Shower/Tub: Tub/shower unit;Curtain Bathroom Toilet: Standard Bathroom Accessibility: Yes Home Adaptive Equipment: Walker - standard Prior Function Level of Independence: Needs assistance with tranfers;Needs assistance with gait;Needs assistance with ADLs Able to Take Stairs?: Yes Driving: No Vocation: On disability Cognition Cognition Arousal/Alertness: Awake/alert Overall Cognitive Status: Appears within functional limits for tasks assessed Orientation Level: Oriented X4 Sensation/Coordination Coordination Gross Motor Movements are Fluid and Coordinated: Not tested Extremity Assessment RUE Assessment RUE Assessment: Exceptions to [facility] RUE AROM (degrees) RUE Overall AROM Comments: Pt with ORIF of elbow and is in a partial splint/cast. Pt able to tolerate AAROM of shoulder to 90 degrees flexion, abduction, and horizontal abduction/adduction. Swelling of hand and digits. Pt and wife shown AAROM exercises for right shoulder, wrist, and digits . Given written exercises and  squeeze ball LUE Assessment LUE Assessment: Within Functional Limits RLE Assessment RLE Assessment: Within Functional Limits LLE Assessment LLE Assessment: Within Functional Limits Mobility (including Balance) Bed Mobility Bed Mobility: Yes Supine to Sit: 3: Mod assist;With rails;HOB elevated (Comment degrees);Other (comment) (40  degrees) Supine to Sit Details (indicate cue type and reason): vc/tc's for technique and hand placement Sitting - Scoot to Edge of Bed: 4: Min assist Transfers Transfers: Yes Sit to  Stand: 3: Mod assist;With upper extremity assist;From bed Sit to Stand Details (indicate cue type and reason): vc's for hand placement and manual assist tohelp pt come forward Stand to Sit: 4: Min assist;To chair/3-in-1 Stand to Sit Details: visual cues to help pt sit more controlled Ambulation/Gait Ambulation/Gait: Yes Ambulation/Gait Assistance: 4: Min assist Ambulation/Gait Assistance Details (indicate cue type and reason): generally steady with little sign of halting or festination.  pt was a bit stiff and guarded. Ambulation Distance (Feet): 110 Feet Assistive device: 1 person hand held assist;Other (Comment) (IV pole) Gait Pattern: Step-through pattern;Decreased step length - right;Decreased step length - left;Decreased stride length (mild festinating only at times) Gait velocity:  (slowed speed) Stairs: No  Posture/Postural Control Posture/Postural Control: No significant limitations Balance Balance Assessed: Yes Static Sitting Balance Static Sitting - Balance Support: Left upper extremity supported;Feet supported Static Sitting - Level of Assistance: 5: Stand by assistance Exercise    End of Session PT - End of Session Activity Tolerance: Patient tolerated treatment well Patient left: in chair;with call bell in reach;with family/visitor present Nurse Communication: Mobility status for transfers;Mobility status for ambulation General Behavior During Session: [facility] for tasks performed Cognition: [facility] for tasks performed  Ferdinand Revoir, Eliseo Gum 10/24/2011, 12:22 PM

## 2011-10-24 NOTE — Evaluation (Signed)
Occupational Therapy Evaluation Patient Details Name: Mark [name] MRN: [id number] DOB: May 19, 1946 Today's Date: 10/24/2011 9:37-10:23 Co-eval with PT Rocky Link Mottinger)  Problem List:  Patient Active Problem List  Diagnoses  . CAD (coronary artery disease)  . HTN (hypertension)  . Parkinson disease  . Fall  . Pre-operative clearance  . Olecranon fracture  . Fracture of right olecranon process    Past Medical History:  Past Medical History  Diagnosis Date  . Hypertension   . PAF (paroxysmal atrial fibrillation)   . Parkinson's disease   . Stroke     Hx of remote stroke  . Gout   . Non-ST elevated myocardial infarction (non-STEMI)   . Coronary artery disease     SEVERE 3-VESSEL DX WITH NORMAL EF  . Fall   . Skin cancer     left shoulder  . Fracture of right olecranon process 10/23/2011   Past Surgical History:  Past Surgical History  Procedure Date  . Coronary stent placement 2000  . Cardiac catheterization 06/26/2011    NORMAL LV SYSTOLIC FUNCTION. RCA IS LARGE AND DOMINANT  . Transthoracic echocardiogram 06/26/2011    EF 55-60%    OT Assessment/Plan/Recommendation OT Assessment Clinical Impression Statement: This 66 yo sp fall a  week ago with resultant fracture fo right olecranon and now s/p ORIF. All education completed--no further acute OT needs. Do recommend HHOT followup to address RUE AROM/AAROM and tub needs OT Recommendation/Assessment: All further OT needs can be met in the next venue of care OT Problem List: Decreased strength;Decreased range of motion;Decreased activity tolerance;Impaired balance (sitting and/or standing);Impaired UE functional use;Pain OT Therapy Diagnosis : Generalized weakness;Acute pain OT Recommendation Follow Up Recommendations: Home health OT Equipment Recommended: 3 in 1 bedside comode Individuals Consulted Consulted and Agree with Results and Recommendations: Patient;Family member/caregiver Family Member Consulted: wife OT  Goals    OT Evaluation Precautions/Restrictions  Precautions Precautions: Fall Required Braces or Orthoses: Yes Other Brace/Splint: arm sling for comfort Restrictions Weight Bearing Restrictions: Yes RUE Weight Bearing: Non weight bearing Prior Functioning Home Living Lives With: Spouse Receives Help From: Family;Other (Comment) (daughter days/wife nights) Type of Home: House Home Layout: Two level;Able to live on main level with bedroom/bathroom Home Access: Stairs to enter Entrance Stairs-Rails: Can reach both Entrance Stairs-Number of Steps: 5 Bathroom Shower/Tub: Tub/shower unit;Curtain Bathroom Toilet: Standard Bathroom Accessibility: Yes Home Adaptive Equipment: Walker - standard Prior Function Level of Independence: Needs assistance with tranfers;Needs assistance with gait;Needs assistance with ADLs Able to Take Stairs?: Yes Driving: No Vocation: On disability ADL ADL Eating/Feeding: Simulated;Set up Where Assessed - Eating/Feeding: Chair Grooming: Simulated;+1 Total assistance Grooming Details (indicate cue type and reason): Per wife has been this way since fracture 1 week ago since pt is left handed Where Assessed - Grooming: Sitting, bed Upper Body Bathing: Simulated;+1 Total assistance Upper Body Bathing Details (indicate cue type and reason): Per wife has been this way since fracture 1 week ago since pt is left handed Where Assessed - Upper Body Bathing: Supported;Sitting, bed;Sitting, chair Lower Body Bathing: Simulated;+1 Total assistance Lower Body Bathing Details (indicate cue type and reason): Per wife has been this way since fracture 1 week ago since pt is left handed Where Assessed - Lower Body Bathing: Supported;Sit to stand from chair;Sit to stand from bed Upper Body Dressing: Simulated;+1 Total assistance Upper Body Dressing Details (indicate cue type and reason): Per wife has been this way since fracture 1 week ago since pt is left handed Where  Assessed - Upper  Body Dressing: Supported;Sitting, chair;Sitting, bed Lower Body Dressing: Simulated;+1 Total assistance Lower Body Dressing Details (indicate cue type and reason): Per wife has been this way since fracture 1 week ago since pt is left handed Where Assessed - Lower Body Dressing: Supported;Sit to stand from bed;Sit to stand from chair Toilet Transfer: Performed;Minimal assistance;Other (comment) (With mod A sit to stand) Toilet Transfer Method: Proofreader: Raised toilet seat with arms (or 3-in-1 over toilet) Toileting - Clothing Manipulation: +1 Total assistance;Simulated Toileting - Clothing Manipulation Details (indicate cue type and reason): Per wife has been this way since fracture 1 week ago since pt is left handed Where Assessed - Glass blower/designer Manipulation: Standing Toileting - Hygiene: Performed;+1 Total assistance Toileting - Hygiene Details (indicate cue type and reason): Per wife has been this way since fracture 1 week ago since pt is left handed Where Assessed - Toileting Hygiene: Standing Tub/Shower Transfer: Not assessed Tub/Shower Transfer Method: Not assessed Equipment Used: Other (comment) (pushing iv pole) Ambulation Related to ADLs: min A Vision/Perception    Cognition Cognition Arousal/Alertness: Awake/alert Overall Cognitive Status: Appears within functional limits for tasks assessed Orientation Level: Oriented X4 Sensation/Coordination Coordination Gross Motor Movements are Fluid and Coordinated: Not tested Extremity Assessment RUE Assessment RUE Assessment: Exceptions to [facility] RUE AROM (degrees) RUE Overall AROM Comments: Pt with ORIF of elbow and is in a partial splint/cast. Pt able to tolerate AAROM of shoulder to 90 degrees flexion, abduction, and horizontal abduction/adduction. Swelling of hand and digits. Pt and wife shown AAROM exercises for right shoulder, wrist, and digits . Given written exercises and  squeeze  ball LUE Assessment LUE Assessment: Within Functional Limits Mobility  Bed Mobility Bed Mobility: Yes Supine to Sit: 3: Mod assist;With rails;HOB elevated (Comment degrees);Other (comment) (40 degrees) Supine to Sit Details (indicate cue type and reason): vc/tc's for technique and hand placement Sitting - Scoot to Edge of Bed: 4: Min assist Transfers Sit to Stand: 3: Mod assist;With upper extremity assist;From bed Sit to Stand Details (indicate cue type and reason): vc's for hand placement and manual assist tohelp pt come forward Stand to Sit: 4: Min assist;To chair/3-in-1 Stand to Sit Details: visual cues to help pt sit more controlled Exercises   End of Session OT - End of Session Equipment Utilized During Treatment: Gait belt;Other (comment) (sling) Activity Tolerance: Patient limited by fatigue Patient left: in chair;with call bell in reach;with family/visitor present;Other (comment) (wife) Nurse Communication: Mobility status for transfers General Behavior During Session: [facility] for tasks performed Cognition: [facility] for tasks performed   Evette Georges [phone] 10/24/2011, 12:22 PM

## 2011-10-24 NOTE — Progress Notes (Signed)
Patient ID: Mark [name], male   DOB: [birth year], 66 y.o.   MRN: [id number] PATIENT ID:      Mark [name]  MRN:     [id number] DOB/AGE:    03-19-1946 / 66 y.o.    PROGRESS NOTE Subjective:  negative for Chest Pain  negative for Shortness of Breath  negative for Nausea/Vomiting   negative for Calf Pain  negative for Bowel Movement   Tolerating Diet: yes         Patient reports pain as 3 on 0-10 scale.    Objective: Vital signs in last 24 hours:   Patient Vitals for the past 24 hrs:  BP Temp Temp src Pulse Resp SpO2 Height Weight  10/24/11 1010 - - - 83  - 93 % - -  10/24/11 0755 126/71 mmHg 98.5 F (36.9 C) Oral 89  20  - - -  10/24/11 0452 123/70 mmHg 98.2 F (36.8 C) Oral 85  18  94 % - -  10/24/11 0000 101/65 mmHg 98.1 F (36.7 C) Oral 89  18  95 % - -  10/23/11 2100 122/68 mmHg 98.5 F (36.9 C) Oral 55  18  97 % - -  10/23/11 1853 130/75 mmHg 98 F (36.7 C) Oral 92  16  93 % 6' (1.829 m) 109 kg (240 lb 4.8 oz)  10/23/11 1815 - - - 94  18  94 % - -  10/23/11 1800 155/81 mmHg - - 98  16  96 % - -  10/23/11 1745 153/73 mmHg - - 98  18  95 % - -  10/23/11 1736 136/66 mmHg 97.6 F (36.4 C) - 90  14  95 % - -  10/23/11 1513 - - - 85  16  100 % - -  10/23/11 1452 - - - 85  16  100 % - -  10/23/11 1424 - - - 76  16  100 % - -  10/23/11 1421 - - - 72  18  100 % - -  10/23/11 1157 117/73 mmHg 97.7 F (36.5 C) Oral 81  18  98 % - -    @flow {1959:LAST@   Intake/Output from previous day:   02/15 0701 - 02/16 0700 In: 1300 [I.V.:1300] Out: 325 [Urine:300]   Intake/Output this shift:   02/16 0701 - 02/16 1900 In: 360 [P.O.:360] Out: 200 [Urine:200]   Intake/Output      02/15 0701 - 02/16 0700 02/16 0701 - 02/17 0700   P.O.  360   I.V. (mL/kg) 1300 (11.9)    Total Intake(mL/kg) 1300 (11.9) 360 (3.3)   Urine (mL/kg/hr) 300 (0.1) 200   Blood 25    Total Output 325 200   Net +975 +160           LABORATORY DATA:  Basename 10/24/11 0630 10/23/11 1215  WBC 10.7*  7.3  HGB 10.6* 11.5*  HCT 30.7* 33.1*  PLT 301 306    Basename 10/24/11 0630 10/23/11 1215  NA 137 140  K 3.9 3.9  CL 103 105  CO2 27 28  BUN 22 26*  CREATININE 1.19 1.29  GLUCOSE 108* 89  CALCIUM 9.1 9.5   No results found for this basename: INR, PROTIME    Examination:  General appearance: alert and cooperative  Wound Exam: in splint  Drainage:  None:   Motor Exam: EDC, FDP and EPL Intact  Sensory Exam: Radial, Ulnar and Median normal  Vascular Exam: cap refill less than 2 sec  Assessment:    1 Day Post-Op  Procedure(s) (LRB): OPEN REDUCTION INTERNAL FIXATION (ORIF) ELBOW/OLECRANON FRACTURE (Right)  ADDITIONAL DIAGNOSIS:  Principal Problem:  *Fracture of right olecranon process     Plan: Occupational Therapy as ordered Weight Bearing as Tolerated (WBAT)  DVT Prophylaxis:  Aspirin and plavix  DISCHARGE PLAN: Home  DISCHARGE NEEDS: sling  D/C home today Office 2 weeks          Shantil Vallejo 10/24/2011, 10:56 AM

## 2011-10-26 ENCOUNTER — Encounter (HOSPITAL_COMMUNITY): Payer: Self-pay | Admitting: Orthopedic Surgery

## 2011-11-03 NOTE — Discharge Summary (Signed)
Physician Discharge Summary  Patient ID: Mark [name] MRN: [id number] DOB/AGE: [birth year] 66 y.o.  Admit date: 10/23/2011 Discharge date: 10/24/2011  Admission Diagnoses:  Fracture of right olecranon process  Discharge Diagnoses:  Principal Problem:  *Fracture of right olecranon process   Past Medical History  Diagnosis Date  . Hypertension   . PAF (paroxysmal atrial fibrillation)   . Parkinson's disease   . Stroke     Hx of remote stroke  . Gout   . Non-ST elevated myocardial infarction (non-STEMI)   . Coronary artery disease     SEVERE 3-VESSEL DX WITH NORMAL EF  . Fall   . Skin cancer     left shoulder  . Fracture of right olecranon process 10/23/2011    Surgeries: Procedure(s): OPEN REDUCTION INTERNAL FIXATION (ORIF) ELBOW/OLECRANON FRACTURE on 10/23/2011   Consultants (if any):    Discharged Condition: Improved  Hospital Course: Mark [name] is an 66 y.o. male who was admitted 10/23/2011 with a diagnosis of Fracture of right olecranon process and went to the operating room on 10/23/2011 and underwent the above named procedures.    He was given perioperative antibiotics:  Anti-infectives     Start     Dose/Rate Route Frequency Ordered Stop   10/23/11 2200   ceFAZolin (ANCEF) IVPB 2 g/50 mL premix        2 g 100 mL/hr over 30 Minutes Intravenous Every 6 hours 10/23/11 1919 10/24/11 1207        .  He was given sequential compression devices, early ambulation, for DVT prophylaxis.  He benefited maximally from the hospital stay and there were no complications.    Recent vital signs:  Filed Vitals:   10/24/11 1010  BP:   Pulse: 83  Temp:   Resp:     Recent laboratory studies:  Lab Results  Component Value Date   HGB 10.6* 10/24/2011   HGB 11.5* 10/23/2011   HGB 11.0* 06/26/2011   Lab Results  Component Value Date   WBC 10.7* 10/24/2011   PLT 301 10/24/2011   No results found for this basename: INR   Lab Results  Component Value Date   NA  137 10/24/2011   K 3.9 10/24/2011   CL 103 10/24/2011   CO2 27 10/24/2011   BUN 22 10/24/2011   CREATININE 1.19 10/24/2011   GLUCOSE 108* 10/24/2011    Discharge Medications:   Medication List  As of 11/03/2011  7:27 AM   STOP taking these medications         HYDROcodone-acetaminophen 7.5-325 MG per tablet         TAKE these medications         amLODipine 5 MG tablet   Commonly known as: NORVASC   Take 2.5 mg by mouth daily.      aspirin EC 81 MG tablet   Take 81 mg by mouth daily.      BACTROBAN 2 %   Generic drug: mupirocin cream   Apply 1 application topically daily as needed.      carbidopa-levodopa 25-100 MG per tablet   Commonly known as: SINEMET   Take 1 tablet by mouth Three times a day.      carvedilol 3.125 MG tablet   Commonly known as: COREG   Take 3.125 mg by mouth 2 (two) times daily with a meal.      clobetasol 0.05 % external solution   Commonly known as: TEMOVATE   Apply 1 application topically every other day  as needed. For scalp      clopidogrel 75 MG tablet   Commonly known as: PLAVIX   Take 75 mg by mouth Daily.      docusate sodium 100 MG capsule   Commonly known as: COLACE   Take 100 mg by mouth 2 (two) times daily.      fluticasone 0.05 % cream   Commonly known as: CUTIVATE   Apply 1 application topically 2 (two) times daily as needed. Apply to affected areas on face      levETIRAcetam 250 MG tablet   Commonly known as: KEPPRA   Take 250 mg by mouth Twice daily.      nitroGLYCERIN 0.4 MG SL tablet   Commonly known as: NITROSTAT   Place 0.4 mg under the tongue every 5 (five) minutes as needed. For chest pain      rosuvastatin 20 MG tablet   Commonly known as: CRESTOR   Take 20 mg by mouth daily.      valsartan-hydrochlorothiazide 160-12.5 MG per tablet   Commonly known as: DIOVAN-HCT   Take 1 tablet by mouth daily.            Diagnostic Studies: Dg Elbow Complete Right  10/13/2011  *RADIOLOGY REPORT*  Clinical Data: Fall, pain   RIGHT ELBOW - COMPLETE 3+ VIEW  Comparison: None.  Findings: There is an olecranon fracture through the olecranon fossa with significant displacement of the proximal and distal fragments.  Marked soft tissue swelling is present.  The radial head appears intact.  There is a large joint effusion.  IMPRESSION: Widely separated olecranon fracture.  Soft tissue swelling with joint effusion.  Original Report Authenticated By: Elsie Stain, M.D.   Dg Knee Complete 4 Views Left  10/13/2011  *RADIOLOGY REPORT*  Clinical Data: Fall, pain  LEFT KNEE - COMPLETE 4+ VIEW  Comparison: None.  Findings: Mild degenerative change at the patellofemoral and medial compartments.  No fracture or effusion.  IMPRESSION: As above.  Original Report Authenticated By: Elsie Stain, M.D.   Dg Knee Complete 4 Views Right  10/13/2011  *RADIOLOGY REPORT*  Clinical Data: Fall, pain  RIGHT KNEE - COMPLETE 4+ VIEW  Comparison:  None.  Findings:  There is no evidence of fracture, dislocation, or joint effusion. Mild degenerative change is seen in the medial compartment and patellofemoral compartment.  Soft tissues are unremarkable.  IMPRESSION: No acute findings.  Original Report Authenticated By: Elsie Stain, M.D.    Disposition: 01-Home or Self Care  Discharge Orders    Future Appointments: Provider: Department: Dept Phone: Center:   11/11/2011 2:00 PM Rosalio Macadamia, NP [facility] [phone] None     Future Orders Please Complete By Expires   Diet general      Call MD / Call 911      Comments:   If you experience chest pain or shortness of breath, CALL 911 and be transported to the hospital emergency room.  If you develope a fever above 101 F, pus (white drainage) or increased drainage or redness at the wound, or calf pain, call your surgeon's office.   Constipation Prevention      Comments:   Drink plenty of fluids.  Prune juice may be helpful.  You may use a stool softener, such as Colace (over the counter) 100  mg twice a day.  Use MiraLax (over the counter) for constipation as needed.   Increase activity slowly as tolerated      Weight Bearing as taught  in Physical Therapy      Comments:   Use a walker or crutches as instructed.   Discharge wound care:      Comments:   If you have a hip bandage, keep it clean and dry.  Change your bandage as instructed by your health care providers.  If your bandage has been discontinued, keep your incision clean and dry.  Pat dry after bathing.  DO NOT put lotion or powder on your incision.   Discharge instructions      Comments:   Keep splint dry.      Follow-up Information    Follow up with Agness Sibrian P, MD in 2 weeks.   Contact information:   [facility] [address] [place] [address] [phone]       Follow up with [facility] RN, Occupational Therapy and Physical Therapy)    Contact information:   [phone]          Signed: Eulas Post 11/03/2011, 7:27 AM

## 2011-11-11 ENCOUNTER — Ambulatory Visit (INDEPENDENT_AMBULATORY_CARE_PROVIDER_SITE_OTHER): Payer: Medicare HMO | Admitting: Nurse Practitioner

## 2011-11-11 ENCOUNTER — Encounter: Payer: Self-pay | Admitting: Nurse Practitioner

## 2011-11-11 VITALS — BP 118/68 | HR 74 | Ht 72.0 in | Wt 228.0 lb

## 2011-11-11 DIAGNOSIS — S52021A Displaced fracture of olecranon process without intraarticular extension of right ulna, initial encounter for closed fracture: Secondary | ICD-10-CM

## 2011-11-11 DIAGNOSIS — I10 Essential (primary) hypertension: Secondary | ICD-10-CM

## 2011-11-11 DIAGNOSIS — G20A1 Parkinson's disease without dyskinesia, without mention of fluctuations: Secondary | ICD-10-CM

## 2011-11-11 DIAGNOSIS — I2510 Atherosclerotic heart disease of native coronary artery without angina pectoris: Secondary | ICD-10-CM

## 2011-11-11 DIAGNOSIS — G20 Parkinson's disease: Secondary | ICD-10-CM

## 2011-11-11 NOTE — Assessment & Plan Note (Signed)
He is currently not symptomatic. Will continue with medical management.

## 2011-11-11 NOTE — Assessment & Plan Note (Signed)
He has tolerated his surgery. He is seeing ortho later today.

## 2011-11-11 NOTE — Assessment & Plan Note (Signed)
Blood pressure is ok. Family will continue to monitor. We will see him back in 4 months. Patient is agreeable to this plan and will call if any problems develop in the interim.

## 2011-11-11 NOTE — Patient Instructions (Signed)
Stay on your current medicines.  Monitor your blood pressure at home. Let us know if has consistent readings less than 100.  We will see you back in 4 months.  Call the [facility] office at [phone] if you have any questions, problems or concerns.

## 2011-11-11 NOTE — Progress Notes (Signed)
Mark [name] Date of Birth: 13-Sep-1945 Medical Record #[id number]  History of Present Illness: [name] is seen back today for his regular visit which is also a post hospital visit. He has Parkinson with recent NSTEMI in October 2012. He was found to have 3 vessel CAD with normal LV function. He was not felt to be a satisfactory candidate for CABG. He has been managed medically. Last month, he fell and had a broken right arm. He has had surgical intervention. We did cut his Norvasc back due to low blood pressure.  He comes in today. He is here with his wife. He is doing ok. No more falls. No chest pain. Not short of breath. Seeing ortho later today. Blood pressure has been ok at home.   Current Outpatient Prescriptions on File Prior to Visit  Medication Sig Dispense Refill  . amLODipine (NORVASC) 5 MG tablet Take 2.5 mg by mouth daily.      Marland Kitchen aspirin EC 81 MG tablet Take 81 mg by mouth daily.        Marland Kitchen BACTROBAN 2 % Apply 1 application topically daily as needed.       . carbidopa-levodopa (SINEMET) 25-100 MG per tablet Take 1 tablet by mouth Three times a day.      . carvedilol (COREG) 3.125 MG tablet Take 3.125 mg by mouth 2 (two) times daily with a meal.        . clobetasol (TEMOVATE) 0.05 % external solution Apply 1 application topically every other day as needed. For scalp      . clopidogrel (PLAVIX) 75 MG tablet Take 75 mg by mouth Daily.      Marland Kitchen docusate sodium (COLACE) 100 MG capsule Take 100 mg by mouth 2 (two) times daily.      . fluticasone (CUTIVATE) 0.05 % cream Apply 1 application topically 2 (two) times daily as needed. Apply to affected areas on face      . levETIRAcetam (KEPPRA) 250 MG tablet Take 250 mg by mouth Twice daily.       . nitroGLYCERIN (NITROSTAT) 0.4 MG SL tablet Place 0.4 mg under the tongue every 5 (five) minutes as needed. For chest pain      . rosuvastatin (CRESTOR) 20 MG tablet Take 20 mg by mouth daily.        . valsartan-hydrochlorothiazide (DIOVAN-HCT)  160-12.5 MG per tablet Take 1 tablet by mouth daily.         No Known Allergies  Past Medical History  Diagnosis Date  . Hypertension   . PAF (paroxysmal atrial fibrillation)   . Parkinson's disease   . Stroke     Hx of remote stroke  . Gout   . Non-ST elevated myocardial infarction (non-STEMI)   . Coronary artery disease     SEVERE 3-VESSEL DX WITH NORMAL EF  . Fall   . Skin cancer     left shoulder  . Fracture of right olecranon process 10/23/2011    Past Surgical History  Procedure Date  . Coronary stent placement 2000  . Cardiac catheterization 06/26/2011    NORMAL LV SYSTOLIC FUNCTION. RCA IS LARGE AND DOMINANT  . Transthoracic echocardiogram 06/26/2011    EF 55-60%  . Orif elbow fracture 10/23/2011    Procedure: OPEN REDUCTION INTERNAL FIXATION (ORIF) ELBOW/OLECRANON FRACTURE;  Surgeon: Eulas Post, MD;  Location: [facility] OR;  Service: Orthopedics;  Laterality: Right;    History  Smoking status  . Never Smoker   Smokeless tobacco  .  Not on file    History  Alcohol Use No    Family History  Problem Relation Age of Onset  . Heart disease    . Arthritis      Review of Systems: The review of systems is per the HPI.  All other systems were reviewed and are negative.  Physical Exam: BP 118/68  Pulse 74  Ht 6' (1.829 m)  Wt 228 lb (103.42 kg)  BMI 30.92 kg/m2 Patient is very pleasant and in no acute distress. He has Parkinson's facies. Skin is warm and dry. Color is normal.  HEENT is unremarkable. Normocephalic/atraumatic. PERRL. Sclera are nonicteric. Neck is supple. No masses. No JVD. Lungs are clear. Cardiac exam shows a regular rate and rhythm. Abdomen is soft. Extremities are without edema. Gait and ROM are intact. Right arm is wrapped. No gross neurologic deficits noted.   LABORATORY DATA: N/A   Assessment / Plan:

## 2011-11-11 NOTE — Assessment & Plan Note (Signed)
This does appear to be his most limiting factor.

## 2011-11-12 ENCOUNTER — Ambulatory Visit: Payer: Medicare HMO | Admitting: Cardiovascular Disease

## 2011-12-10 DIAGNOSIS — R569 Unspecified convulsions: Secondary | ICD-10-CM

## 2011-12-10 HISTORY — DX: Unspecified convulsions: R56.9

## 2011-12-15 ENCOUNTER — Encounter (HOSPITAL_COMMUNITY): Payer: Self-pay | Admitting: *Deleted

## 2011-12-15 ENCOUNTER — Emergency Department (HOSPITAL_COMMUNITY)
Admission: EM | Admit: 2011-12-15 | Discharge: 2011-12-16 | Disposition: A | Discharge status: Home / Self Care | Payer: Medicare HMO | Attending: Emergency Medicine | Admitting: Emergency Medicine

## 2011-12-15 DIAGNOSIS — I10 Essential (primary) hypertension: Secondary | ICD-10-CM | POA: Insufficient documentation

## 2011-12-15 DIAGNOSIS — G20A1 Parkinson's disease without dyskinesia, without mention of fluctuations: Secondary | ICD-10-CM | POA: Insufficient documentation

## 2011-12-15 DIAGNOSIS — Z79899 Other long term (current) drug therapy: Secondary | ICD-10-CM | POA: Insufficient documentation

## 2011-12-15 DIAGNOSIS — Z8673 Personal history of transient ischemic attack (TIA), and cerebral infarction without residual deficits: Secondary | ICD-10-CM | POA: Insufficient documentation

## 2011-12-15 DIAGNOSIS — G20 Parkinson's disease: Secondary | ICD-10-CM | POA: Insufficient documentation

## 2011-12-15 DIAGNOSIS — R569 Unspecified convulsions: Secondary | ICD-10-CM | POA: Insufficient documentation

## 2011-12-15 NOTE — ED Notes (Signed)
Pt brought to department via EMS. Pt from home. Reportedly had a seizure, unknown duration.   Pt presently slightly disoriented, but cooperative.  Denies pain at present time. No distress noted.  Per EMS and pt, he does have a history of seizures.

## 2011-12-16 MED ORDER — SODIUM CHLORIDE 0.9 % IV SOLN
500.0000 mg | Freq: Once | INTRAVENOUS | Status: AC
  Administered 2011-12-16: 500 mg via INTRAVENOUS
  Filled 2011-12-16: qty 5

## 2011-12-16 MED ORDER — LEVETIRACETAM 500 MG/5ML IV SOLN
INTRAVENOUS | Status: AC
  Filled 2011-12-16: qty 5

## 2011-12-16 MED ORDER — LORAZEPAM 2 MG/ML IJ SOLN
2.0000 mg | Freq: Once | INTRAMUSCULAR | Status: AC
  Administered 2011-12-16: 2 mg via INTRAVENOUS
  Filled 2011-12-16: qty 1

## 2011-12-16 NOTE — ED Provider Notes (Signed)
History     CSN: [id number]  Arrival date & time 12/15/11  2146   First MD Initiated Contact with Patient 12/15/11 2300      Chief Complaint  Patient presents with  . Seizures    (Consider location/radiation/quality/duration/timing/severity/associated sxs/prior treatment) HPI Comments: Wife states pt had a seizure ~ 2000 today.  She awakened her with slobbering and spitting.  It was typical of his previous seizures.  He was postictal afterward but she states he is at his baseline at exam time.  He did not have his keppra today b/c he ran out.  He will get an rx filled tomorrow.  Pt of dr. Gerilyn Pilgrim.  The history is provided by the patient and the spouse.    Past Medical History  Diagnosis Date  . Hypertension   . PAF (paroxysmal atrial fibrillation)   . Parkinson's disease   . Stroke     Hx of remote stroke  . Gout   . Non-ST elevated myocardial infarction (non-STEMI)   . Coronary artery disease     SEVERE 3-VESSEL DX WITH NORMAL EF  . Fall   . Skin cancer     left shoulder  . Fracture of right olecranon process 10/23/2011    Past Surgical History  Procedure Date  . Coronary stent placement 2000  . Cardiac catheterization 06/26/2011    NORMAL LV SYSTOLIC FUNCTION. RCA IS LARGE AND DOMINANT  . Transthoracic echocardiogram 06/26/2011    EF 55-60%  . Orif elbow fracture 10/23/2011    Procedure: OPEN REDUCTION INTERNAL FIXATION (ORIF) ELBOW/OLECRANON FRACTURE;  Surgeon: Eulas Post, MD;  Location: [facility] OR;  Service: Orthopedics;  Laterality: Right;    Family History  Problem Relation Age of Onset  . Heart disease    . Arthritis      History  Substance Use Topics  . Smoking status: Never Smoker   . Smokeless tobacco: Not on file  . Alcohol Use: No      Review of Systems  Constitutional: Negative for fever.  Neurological: Positive for seizures. Negative for dizziness, speech difficulty, weakness, light-headedness, numbness and headaches.  All other systems  reviewed and are negative.    Allergies  Review of patient's allergies indicates no known allergies.  Home Medications   Current Outpatient Rx  Name Route Sig Dispense Refill  . AMLODIPINE BESYLATE 5 MG PO TABS Oral Take 2.5 mg by mouth daily.    . ASPIRIN EC 81 MG PO TBEC Oral Take 81 mg by mouth daily.      Marland Kitchen BACTROBAN 2 % EX CREA Topical Apply 1 application topically daily as needed.     Marland Kitchen CARBIDOPA-LEVODOPA 25-100 MG PO TABS Oral Take 1 tablet by mouth Three times a day.    Marland Kitchen CARVEDILOL 3.125 MG PO TABS Oral Take 3.125 mg by mouth 2 (two) times daily with a meal.      . CLOBETASOL PROPIONATE 0.05 % EX SOLN Topical Apply 1 application topically every other day as needed. For scalp    . CLOPIDOGREL BISULFATE 75 MG PO TABS Oral Take 75 mg by mouth Daily.    Marland Kitchen DOCUSATE SODIUM 100 MG PO CAPS Oral Take 100 mg by mouth 2 (two) times daily.    Marland Kitchen FLUTICASONE PROPIONATE 0.05 % EX CREA Topical Apply 1 application topically 2 (two) times daily as needed. Apply to affected areas on face    . HYDROCODONE-ACETAMINOPHEN 7.5-325 MG PO TABS Oral Take 1 tablet by mouth Once daily as needed. FOR PAIN    .  LEVETIRACETAM 250 MG PO TABS Oral Take 250 mg by mouth Twice daily.     Marland Kitchen ROSUVASTATIN CALCIUM 20 MG PO TABS Oral Take 20 mg by mouth daily.      Marland Kitchen VALSARTAN-HYDROCHLOROTHIAZIDE 160-12.5 MG PO TABS Oral Take 1 tablet by mouth daily.     Marland Kitchen NITROGLYCERIN 0.4 MG SL SUBL Sublingual Place 0.4 mg under the tongue every 5 (five) minutes as needed. For chest pain      BP 142/75  Pulse 105  Temp(Src) 98.4 F (36.9 C) (Oral)  Resp 18  Ht 6' (1.829 m)  Wt 225 lb (102.059 kg)  BMI 30.52 kg/m2  SpO2 96%  Physical Exam  Nursing note and vitals reviewed. Constitutional: He is oriented to person, place, and time. He appears well-developed and well-nourished.  HENT:  Head: Normocephalic and atraumatic.  Right Ear: External ear normal.  Left Ear: External ear normal.  Eyes: EOM are normal.  Neck: Normal  range of motion.  Cardiovascular: Regular rhythm, normal heart sounds and intact distal pulses.  Exam reveals no gallop and no friction rub.   No murmur heard. Pulmonary/Chest: Effort normal and breath sounds normal. No accessory muscle usage. Not tachypneic. No respiratory distress. He has no decreased breath sounds. He has no wheezes. He has no rhonchi. He has no rales. He exhibits no tenderness.  Abdominal: Soft. Normal appearance and bowel sounds are normal. He exhibits no distension. There is no tenderness.  Musculoskeletal: Normal range of motion. He exhibits no tenderness.  Lymphadenopathy:    He has no cervical adenopathy.  Neurological: He is alert and oriented to person, place, and time. He has normal strength. He displays tremor. No cranial nerve deficit or sensory deficit. He displays seizure activity. Coordination normal. GCS eye subscore is 4. GCS verbal subscore is 5. GCS motor subscore is 6.       Pt has parkinson's dz.  Skin: Skin is warm and dry.  Psychiatric: He has a normal mood and affect. Judgment normal.    ED Course  Procedures (including critical care time)  Labs Reviewed - No data to display No results found.   1. Seizure       MDM  Fill keppra rx tomorrow as planned. F/u with dr. Theone Stanley Paul Half, PA 12/16/11 0050  Worthy Rancher, PA 12/16/11 [phone]

## 2011-12-16 NOTE — ED Provider Notes (Signed)
Medical screening examination/treatment/procedure(s) were performed by non-physician practitioner and as supervising physician I was immediately available for consultation/collaboration.  Nicoletta Dress. Colon Branch, MD 12/16/11 [phone]

## 2011-12-16 NOTE — Discharge Instructions (Signed)
Epilepsy  A seizure (convulsion) is a sudden change in brain function that causes a change in behavior, muscle activity, or ability to remain awake and alert. If a person has recurring seizures, this is called epilepsy.  CAUSES   Epilepsy is a disorder with many possible causes. Anything that disturbs the normal pattern of brain cell activity can lead to seizures. Seizure can be caused from illness to brain damage to abnormal brain development. Epilepsy may develop because of:   An abnormality in brain wiring.   An imbalance of nerve signaling chemicals (neurotransmitters).   Some combination of these factors.  Scientists are learning an increasing amount about genetic causes of seizures.  SYMPTOMS   The symptoms of a seizure can vary greatly from one person to another. These may include:   An aura, or warning that tells a person they are about to have a seizure.   Abnormal sensations, such as abnormal smell or seeing flashing lights.   Sudden, general body stiffness.   Rhythmic jerking of the face, arm, or leg - on one or both sides.   Sudden change in consciousness.   The person may appear to be awake but not responding.   They may appear to be asleep but cannot be awakened.   Grimacing, chewing, lip smacking, or drooling.   Often there is a period of sleepiness after a seizure.  DIAGNOSIS   The description you give to your caregiver about what you experienced will help them understand your problems. Equally important is the description by any witnesses to your seizure. A physical exam, including a detailed neurological exam, is necessary. An EEG (electroencephalogram) is a painless test of your brain waves. In this test a diagram is created of your brain waves. These diagrams can be interpreted by a specialist. Pictures of your brain are usually taken with:   An MRI.   A CT scan.  Lab tests may be done to look for:   Signs of infection.   Abnormal blood chemistry.  PREVENTION   There is no way to  prevent the development of epilepsy. If you have seizures that are typically triggered by an event (such as flashing lights), try to avoid the trigger. This can help you avoid a seizure.   PROGNOSIS   Most people with epilepsy lead outwardly normal lives. While epilepsy cannot currently be cured, for some people it does eventually go away. Most seizures do not cause brain damage. It is not uncommon for people with epilepsy, especially children, to develop behavioral and emotional problems. These problems are sometimes the consequence of medicine for seizures or social stress. For some people with epilepsy, the risk of seizures restricts their independence and recreational activities. For example, some states refuse drivers licenses to people with epilepsy.  Most women with epilepsy can become pregnant. They should discuss their epilepsy and the medicine they are taking with their caregivers. Women with epilepsy have a 90 percent or better chance of having a normal, healthy baby.  RISKS AND COMPLICATIONS   People with epilepsy are at increased risk of falls, accidents, and injuries. People with epilepsy are at special risk for two life-threatening conditions. These are status epilepticus and sudden unexplained death (extremely rare). Status epilepticus is a long lasting, continuous seizure that is a medical emergency.  TREATMENT   Once epilepsy is diagnosed, it is important to begin treatment as soon as possible. For about 80 percent of those diagnosed with epilepsy, seizures can be controlled with modern medicines   FDA approved a pacemaker for the brain the (vagus nerve stimulator). This stimulator can be used for people with seizures that are not well-controlled by medicine. Studies have shown that in some cases, children may experience fewer seizures if they maintain a  strict diet. The strict diet is called the ketogenic diet. This diet is rich in fats and low in carbohydrates. HOME CARE INSTRUCTIONS   Your caregiver will make recommendations about driving and safety in normal activities. Follow these carefully.   Take any medicine prescribed exactly as directed.   Do any blood tests requested to monitor the levels of your medicine.   The people you live and work with should know that you are prone to seizures. They should receive instructions on how to help you. In general, a witness to a seizure should:   Cushion your head and body.   Turn you on your side.   Avoid unnecessarily restraining you.   Not place anything inside your mouth.   Call for local emergency medical help if there is any question about what has occurred.   Keep a seizure diary. Record what you recall about any seizure, especially any possible trigger.   If your caregiver has given you a follow-up appointment, it is very important to keep that appointment. Not keeping the appointment could result in permanent injury and disability. If there is any problem keeping the appointment, you must call back to this facility for assistance.  SEEK MEDICAL CARE IF:   You develop signs of infection or other illness. This might increase the risk of a seizure.   You seem to be having more frequent seizures.   Your seizure pattern is changing.  SEEK IMMEDIATE MEDICAL CARE IF:   A seizure does not stop after a few moments.   A seizure causes any difficulty in breathing.   A seizure results in a very severe headache.   A seizure leaves you with the inability to speak or use a part of your body.  MAKE SURE YOU:   Understand these instructions.   Will watch your condition.   Will get help right away if you are not doing well or get worse.  Document Released: 08/24/2005 Document Revised: 08/13/2011 Document Reviewed: 03/30/2008 [facility] Patient Information 2012 [place],  [place].    Take all meds as directed.  Make sure you don't run out of your keppra.  Get refills a day or two before running out.  Follow up with dr. Gerilyn Pilgrim

## 2011-12-16 NOTE — ED Notes (Signed)
Assisted to wheelchair and escorted to POV; assisted into vehicle and left in c/o spouse for transport home.

## 2011-12-19 ENCOUNTER — Encounter (HOSPITAL_COMMUNITY): Payer: Self-pay | Admitting: *Deleted

## 2011-12-19 ENCOUNTER — Emergency Department (HOSPITAL_COMMUNITY)
Admission: EM | Admit: 2011-12-19 | Discharge: 2011-12-19 | Disposition: A | Discharge status: Home / Self Care | Payer: Medicare HMO | Attending: Emergency Medicine | Admitting: Emergency Medicine

## 2011-12-19 ENCOUNTER — Emergency Department (HOSPITAL_COMMUNITY): Payer: Medicare HMO

## 2011-12-19 DIAGNOSIS — I252 Old myocardial infarction: Secondary | ICD-10-CM | POA: Insufficient documentation

## 2011-12-19 DIAGNOSIS — M79609 Pain in unspecified limb: Secondary | ICD-10-CM | POA: Insufficient documentation

## 2011-12-19 DIAGNOSIS — G20A1 Parkinson's disease without dyskinesia, without mention of fluctuations: Secondary | ICD-10-CM | POA: Insufficient documentation

## 2011-12-19 DIAGNOSIS — M25529 Pain in unspecified elbow: Secondary | ICD-10-CM | POA: Insufficient documentation

## 2011-12-19 DIAGNOSIS — I2510 Atherosclerotic heart disease of native coronary artery without angina pectoris: Secondary | ICD-10-CM | POA: Insufficient documentation

## 2011-12-19 DIAGNOSIS — Z79899 Other long term (current) drug therapy: Secondary | ICD-10-CM | POA: Insufficient documentation

## 2011-12-19 DIAGNOSIS — I10 Essential (primary) hypertension: Secondary | ICD-10-CM | POA: Insufficient documentation

## 2011-12-19 DIAGNOSIS — Z8639 Personal history of other endocrine, nutritional and metabolic disease: Secondary | ICD-10-CM | POA: Insufficient documentation

## 2011-12-19 DIAGNOSIS — S52023A Displaced fracture of olecranon process without intraarticular extension of unspecified ulna, initial encounter for closed fracture: Secondary | ICD-10-CM | POA: Insufficient documentation

## 2011-12-19 DIAGNOSIS — Z862 Personal history of diseases of the blood and blood-forming organs and certain disorders involving the immune mechanism: Secondary | ICD-10-CM | POA: Insufficient documentation

## 2011-12-19 DIAGNOSIS — W108XXA Fall (on) (from) other stairs and steps, initial encounter: Secondary | ICD-10-CM | POA: Insufficient documentation

## 2011-12-19 DIAGNOSIS — J321 Chronic frontal sinusitis: Secondary | ICD-10-CM | POA: Insufficient documentation

## 2011-12-19 DIAGNOSIS — Z8673 Personal history of transient ischemic attack (TIA), and cerebral infarction without residual deficits: Secondary | ICD-10-CM | POA: Insufficient documentation

## 2011-12-19 DIAGNOSIS — W19XXXA Unspecified fall, initial encounter: Secondary | ICD-10-CM

## 2011-12-19 DIAGNOSIS — IMO0002 Reserved for concepts with insufficient information to code with codable children: Secondary | ICD-10-CM | POA: Insufficient documentation

## 2011-12-19 DIAGNOSIS — Z7982 Long term (current) use of aspirin: Secondary | ICD-10-CM | POA: Insufficient documentation

## 2011-12-19 DIAGNOSIS — G20 Parkinson's disease: Secondary | ICD-10-CM | POA: Insufficient documentation

## 2011-12-19 MED ORDER — OXYCODONE-ACETAMINOPHEN 5-325 MG PO TABS
2.0000 | ORAL_TABLET | Freq: Once | ORAL | Status: AC
  Administered 2011-12-19: 2 via ORAL
  Filled 2011-12-19: qty 2

## 2011-12-19 NOTE — ED Provider Notes (Signed)
This chart was scribed for Mark Octave, MD by Wallis Mart. The patient was seen in room APA08/APA08 and the patient's care was started at 10:02 AM.   CSN: [id number]  Arrival date & time 12/19/11  0934   First MD Initiated Contact with Patient 12/19/11 [phone]      Chief Complaint  Patient presents with  . Fall    (Consider location/radiation/quality/duration/timing/severity/associated sxs/prior treatment) HPI Mark [name] is a 66 y.o. male who presents to the Emergency Department complaining of a fall while walking up the steps this morning. Denies hitting head.  Pt c/o pain to right arm, right leg, right finger. Pt broke right arm 6 weeks ago and had surgery on it. Denies neck pain, chest pain, stomach pain, vomiting. Pt takes baby aspirin.  Pt w/ h/o parkinson's, Hypertension, stroke.   Pt has not taken any pain medications for the fall.   There are no other associated symptoms and no other alleviating or aggravating factors.   Past Medical History  Diagnosis Date  . Hypertension   . PAF (paroxysmal atrial fibrillation)   . Parkinson's disease   . Stroke     Hx of remote stroke  . Gout   . Non-ST elevated myocardial infarction (non-STEMI)   . Coronary artery disease     SEVERE 3-VESSEL DX WITH NORMAL EF  . Fall   . Skin cancer     left shoulder  . Fracture of right olecranon process 10/23/2011    Past Surgical History  Procedure Date  . Coronary stent placement 2000  . Cardiac catheterization 06/26/2011    NORMAL LV SYSTOLIC FUNCTION. RCA IS LARGE AND DOMINANT  . Transthoracic echocardiogram 06/26/2011    EF 55-60%  . Orif elbow fracture 10/23/2011    Procedure: OPEN REDUCTION INTERNAL FIXATION (ORIF) ELBOW/OLECRANON FRACTURE;  Surgeon: Eulas Post, MD;  Location: [facility] OR;  Service: Orthopedics;  Laterality: Right;    Family History  Problem Relation Age of Onset  . Heart disease    . Arthritis      History  Substance Use Topics  . Smoking status:  Never Smoker   . Smokeless tobacco: Not on file  . Alcohol Use: No      Review of Systems 10 Systems reviewed and all are negative for acute change except as noted in the HPI.   Allergies  Review of patient's allergies indicates no known allergies.  Home Medications   Current Outpatient Rx  Name Route Sig Dispense Refill  . AMLODIPINE BESYLATE 5 MG PO TABS Oral Take 2.5 mg by mouth daily.    . ASPIRIN EC 81 MG PO TBEC Oral Take 81 mg by mouth daily.      Marland Kitchen CARBIDOPA-LEVODOPA 25-100 MG PO TABS Oral Take 1 tablet by mouth Three times a day.    Marland Kitchen CARVEDILOL 3.125 MG PO TABS Oral Take 3.125 mg by mouth 2 (two) times daily with a meal.      . CLOBETASOL PROPIONATE 0.05 % EX SOLN Topical Apply 1 application topically every other day as needed. For scalp    . CLOPIDOGREL BISULFATE 75 MG PO TABS Oral Take 75 mg by mouth Daily.    Marland Kitchen DOCUSATE SODIUM 100 MG PO CAPS Oral Take 100 mg by mouth 2 (two) times daily.    Marland Kitchen FLUTICASONE PROPIONATE 0.05 % EX CREA Topical Apply 1 application topically 2 (two) times daily as needed. Apply to affected areas on face    . HYDROCODONE-ACETAMINOPHEN 7.5-325 MG PO TABS Oral  Take 1 tablet by mouth Once daily as needed. FOR PAIN    . LEVETIRACETAM 250 MG PO TABS Oral Take 250 mg by mouth Twice daily.     Marland Kitchen ROSUVASTATIN CALCIUM 20 MG PO TABS Oral Take 20 mg by mouth daily.      Marland Kitchen VALSARTAN-HYDROCHLOROTHIAZIDE 160-12.5 MG PO TABS Oral Take 1 tablet by mouth daily.     Marland Kitchen BACTROBAN 2 % EX CREA Topical Apply 1 application topically daily as needed.     Marland Kitchen NITROGLYCERIN 0.4 MG SL SUBL Sublingual Place 0.4 mg under the tongue every 5 (five) minutes x 3 doses as needed. For chest pain      BP 141/70  Pulse 78  Temp(Src) 97.7 F (36.5 C) (Oral)  Resp 16  Ht 6' (1.829 m)  Wt 225 lb (102.059 kg)  BMI 30.52 kg/m2  SpO2 96%  Physical Exam  Nursing note and vitals reviewed. Constitutional: He is oriented to person, place, and time. He appears well-developed and  well-nourished. No distress.  HENT:  Head: Normocephalic and atraumatic.  Eyes: EOM are normal. Pupils are equal, round, and reactive to light.  Neck: Normal range of motion. Neck supple. No tracheal deviation present.  Cardiovascular: Normal rate and regular rhythm.   Pulmonary/Chest: Effort normal and breath sounds normal. No respiratory distress.  Abdominal: Soft. He exhibits no distension.  Musculoskeletal: He exhibits tenderness. He exhibits no edema.       Well healed incision over right olecranon, reduced ROM, diffused tender to palpation, right shoulder is nontender, hand movements in tact, abraasion to PIP of right fifth finger, +2 DP's, TP's  abrasion to right shin.  Neurological: He is alert and oriented to person, place, and time. No sensory deficit.  Skin: Skin is warm and dry.  Psychiatric: He has a normal mood and affect. His behavior is normal.    ED Course  Procedures (including critical care time) DIAGNOSTIC STUDIES: Oxygen Saturation is 100% on room air, normal by my interpretation.    COORDINATION OF CARE:  10:12 AM: Pt evaluated, physical examination complete   12:08: EDP at bedside. Pt is feeling better. All results reviewed and discussed with pt, questions answered, pt agreeable with plan.  1:34 PM: EDP at bedside. Pt feeling better.   1:41 PM: EDP phone consult w/ orthopedics.   Labs Reviewed - No data to display Dg Elbow Complete Right  12/19/2011  *RADIOLOGY REPORT*  Clinical Data: Fall.  Elbow pain and swelling.  RIGHT ELBOW - COMPLETE 3+ VIEW  Comparison: 10/13/2011  Findings: Posterior plate screw fixator of the olecranon noted with near anatomic alignment.  Prominent soft tissue swelling noted along the proximal forearm. There is soft tissue swelling dorsal to the olecranon, and anterior sail sign compatible with elbow joint effusion.  Forearm appears pronated and flexed on the frontal and oblique projections, resulting in reduced cortical definition of  the proximal radius and ulna.  No distal humeral fracture is observed.  I do not observe a definite acute fracture of the distal radius or ulna.  IMPRESSION:  1.  Abnormal soft tissue swelling proximally in the forearm, especially dorsally. 2.  Posterior ulnar plate screw fixator noted with near anatomic alignment. 3.  Elbow joint effusion is present. 4.  Although I do not see a fracture of the proximal radius or ulna, due to pronation and flexion of the elbow during imaging there is reduced visualization of cortical margins which reduces diagnostic sensitivity and specificity.  Elbow CT may be warranted  if there is high index of suspicion of occult fracture.  Original Report Authenticated By: Dellia Cloud, M.D.   Dg Forearm Right  12/19/2011  *RADIOLOGY REPORT*  Clinical Data: Fall.  Elbow pain and swelling.  Reduced range of motion.  RIGHT FOREARM - 2 VIEW  Comparison: 12/19/2011  Findings: Plate screw fixator of the proximal ulna noted.  Linear lucency along the olecranon cortex proximal to the fixation is observed, and could conceivably represent a new fracture.  Elbow effusion noted.  Soft tissue swelling is present in the proximal forearm.  IMPRESSION:  1.  Faint linear cortical lucency in the olecranon proximal to the fracture fixation hardware, query acute nondisplaced proximal olecranon fracture.  Consider CT scan of the elbow. 2.  Subcutaneous edema noted along the proximal forearm. 3.  Elbow joint effusion.  Original Report Authenticated By: Dellia Cloud, M.D.   Dg Tibia/fibula Right  12/19/2011  *RADIOLOGY REPORT*  Clinical Data: Lower leg pain.  Laceration.  Fall.  RIGHT TIBIA AND FIBULA - 2 VIEW  Comparison: 10/13/2011  Findings: Minimal spurring of the distal tibial rim noted.  A plantar calcaneal spur is present.  No fracture is observed.  No foreign body evident.  There is likely mild soft tissue swelling anterior to the tibia.  IMPRESSION:  1.  Mild soft tissue swelling  anterior to the tibia. 2.  Small plantar calcaneal spur. 3.  Minimal anterior spurring and distal tibial rim.  Original Report Authenticated By: Dellia Cloud, M.D.   Ct Elbow Right W/o Cm  12/19/2011  *RADIOLOGY REPORT*  Clinical Data: Elbow pain status post fall today.  Recent elbow surgery.  CT OF THE RIGHT ELBOW WITHOUT CONTRAST  Technique:  Multidetector CT imaging was performed according to the standard protocol. Multiplanar CT image reconstructions were also generated.  Comparison: Radiographs same date and 10/13/2011.  Findings: The patient was unable to raise her arm above her head or lie prone.  The study was performed with the patient supine with her arm by her side.  There is significantly degraded image quality with limited bone detail.  The olecranon plate and screws are intact without hardware loosening.  There does appear to be a nondisplaced fracture of the olecranon process proximal to the plate, best seen on axial images 27 and 28 and coronal image 22.  The original fracture of the olecranon process has healed.  The distal humerus and radial head are intact.  There is no dislocation.  Dorsal soft tissue swelling is noted.  There is a probable moderate sized hemarthrosis, suboptimally evaluated due to technique.  IMPRESSION:  1.  Nondisplaced fracture of the tip of the olecranon process, proximal to the fixation hardware. 2.  Elbow hemarthrosis. 3.  No hardware displacement or dislocation identified.  Please note limitations of this examination.  Original Report Authenticated By: Gerrianne Scale, M.D.   Dg Hand Complete Right  12/19/2011  *RADIOLOGY REPORT*  Clinical Data: Fall.  Pain in hand.  Laceration.  RIGHT HAND - COMPLETE 3+ VIEW  Comparison: None.  Findings: No fracture, foreign body, or acute bony findings are identified.  IMPRESSION:  No significant abnormality identified.  Original Report Authenticated By: Dellia Cloud, M.D.     1. Fall   2. Olecranon fracture        MDM  Mechanical fall going up the stairs with right arm pain and right leg pain. Patient had ORIF of his right olecranon about 6 weeks ago by Dr. Dion Saucier. Neurovascularly intact.  Per wife he does not use his right arm much at baseline even before the surgery.  No loss of consciousness, no blood thinners, normal behavior no vomiting. No indication for head CT. Faint cortical lucency proximal to fracture fixation hardware. CT elbow recommended by radiology.  Results of right elbow imaging discussed with Dr. Dion Saucier. No evidence of hardware failure. possible Olecranon chip fracture. He recommends a posterior sling and followup in one week.  I personally performed the services described in this documentation, which was scribed in my presence.  The recorded information has been reviewed and considered.       Mark Octave, MD 12/19/11 [phone]

## 2011-12-19 NOTE — Discharge Instructions (Signed)
Elbow Injury Followup with Dr. Dion Saucier one week regarding the new fracture in her right elbow. Return to the ED for new or worsening symptoms You or your child has an elbow injury. X-rays and exam today do not show evidence of a fracture (broken bone). That means that only a sling or splint may be required for a brief period of time as directed by your caregiver. HOME CARE INSTRUCTIONS  Only take over-the-counter or prescription medicines for pain, discomfort, or fever as directed by your caregiver.   If you have a splint held on with an elastic wrap or a cast, watch your hand or fingers. If they become numb or cold and blue, loosen the wrap and reapply more loosely. See your caregiver if there is no relief.   You may use ice on your elbow for 15 to 20 minutes, 3 to 4 times per day, for the first 2 to 3 days.   Use your elbow as directed.   See your caregiver as directed. It is very important to keep all follow-up referrals and appointments in order to avoid any long-term problems with your elbow including chronic pain or inability to move the elbow normally.  SEEK IMMEDIATE MEDICAL CARE IF:   There is swelling or increasing pain in your elbow which is not relieved with medications.   You begin to lose feeling in your hand or fingers, or develop swelling of the hand and fingers.   You get a cold or blue hand or fingers on injured side.   If your elbow remains sore, your caregiver may want to x-ray it again. A hairline fracture may not show up on the first x-rays and may only be seen on repeat x-rays ten days to two weeks later. A specialist (radiologist) may examine your x-rays at a later time. In order to get results from the radiologist or their department, make sure you know how and when you are to get that information. It is your responsibility to get results of any tests you may have had.  MAKE SURE YOU:   Understand these instructions.   Will watch your condition.   Will get help  right away if you are not doing well or get worse.  Document Released: 11/14/2003 Document Revised: 08/13/2011 Document Reviewed: 04/11/2008 [facility] Patient Information 2012 [place], [place].

## 2011-12-19 NOTE — ED Notes (Signed)
Pt fell while going up the steps this am. C/o pain in his right arm from elbow down and also right lower leg. Abrasion noted to right shin.

## 2011-12-19 NOTE — ED Notes (Signed)
Patient transported to CT 

## 2011-12-25 ENCOUNTER — Other Ambulatory Visit: Payer: Self-pay | Admitting: Orthopedic Surgery

## 2011-12-25 ENCOUNTER — Encounter (HOSPITAL_COMMUNITY): Payer: Self-pay | Admitting: Pharmacy Technician

## 2011-12-28 ENCOUNTER — Encounter (HOSPITAL_COMMUNITY)
Admission: RE | Admit: 2011-12-28 | Discharge: 2011-12-28 | Disposition: A | Discharge status: Home / Self Care | Payer: Medicare HMO | Source: Ambulatory Visit | Attending: Anesthesiology | Admitting: Anesthesiology

## 2011-12-28 ENCOUNTER — Encounter (HOSPITAL_COMMUNITY): Payer: Self-pay

## 2011-12-28 ENCOUNTER — Encounter (HOSPITAL_COMMUNITY)
Admission: RE | Admit: 2011-12-28 | Discharge: 2011-12-28 | Disposition: A | Discharge status: Home / Self Care | Payer: Medicare HMO | Source: Ambulatory Visit | Attending: Orthopedic Surgery | Admitting: Orthopedic Surgery

## 2011-12-28 HISTORY — DX: Unspecified osteoarthritis, unspecified site: M19.90

## 2011-12-28 HISTORY — DX: Unspecified convulsions: R56.9

## 2011-12-28 LAB — CBC
HCT: 36.5 % — ABNORMAL LOW (ref 39.0–52.0)
MCV: 89.7 fL (ref 78.0–100.0)
RDW: 13.2 % (ref 11.5–15.5)
WBC: 6.9 10*3/uL (ref 4.0–10.5)

## 2011-12-28 LAB — BASIC METABOLIC PANEL
BUN: 16 mg/dL (ref 6–23)
CO2: 31 mEq/L (ref 19–32)
Chloride: 105 mEq/L (ref 96–112)
Creatinine, Ser: 1.17 mg/dL (ref 0.50–1.35)

## 2011-12-28 MED ORDER — CEFAZOLIN SODIUM-DEXTROSE 2-3 GM-% IV SOLR
2.0000 g | INTRAVENOUS | Status: AC
  Administered 2011-12-29: 2 g via INTRAVENOUS
  Filled 2011-12-28: qty 50

## 2011-12-28 NOTE — Progress Notes (Signed)
EKG 2.13, NOTE FROM DR HYQMVHQI FROM 3.13, ECHO 10.12, CATH 10.12 PATIENT HAD SPINAL WITH LAST SURGERY PER DAUGHTER

## 2011-12-28 NOTE — Pre-Procedure Instructions (Addendum)
20 Mark [name]  12/28/2011   Your procedure is scheduled on: 4.23.13  Report to [facility] at800 AM.  Call this number if you have problems the morning of surgery: [phone]   Remember:   Do not eat food:After Midnight.  May have clear liquids: up to 4 Hours before arrival. 400 AM  Clear liquids include soda, tea, black coffee, apple or grape juice, broth.  Take these medicines the morning of surgery with A SIP OF WATER: hydrocodone, amlodipine,carbodopa-levadopa,carevedilol,keppra   STOP plavix, ASPIRIN   Do not wear jewelry, make-up or nail polish.  Do not wear lotions, powders, or perfumes. You may wear deodorant.  Do not shave 48 hours prior to surgery.  Do not bring valuables to the hospital.  Contacts, dentures or bridgework may not be worn into surgery.  Leave suitcase in the car. After surgery it may be brought to your room.  For patients admitted to the hospital, checkout time is 11:00 AM the day of discharge.   Patients discharged the day of surgery will not be allowed to drive home.  Name and phone number of your driver: [facility] [phone],AV  Special Instructions: Incentive Spirometry - Practice and bring it with you on the day of surgery. and CHG Shower Use Special Wash: 1/2 bottle night before surgery and 1/2 bottle morning of surgery.   Please read over the following fact sheets that you were given: Pain Booklet, Coughing and Deep Breathing, MRSA Information and Surgical Site Infection Prevention

## 2011-12-29 ENCOUNTER — Encounter (HOSPITAL_COMMUNITY): Payer: Self-pay | Admitting: *Deleted

## 2011-12-29 ENCOUNTER — Encounter (HOSPITAL_COMMUNITY): Payer: Self-pay | Admitting: Anesthesiology

## 2011-12-29 ENCOUNTER — Observation Stay (HOSPITAL_COMMUNITY)
Admission: RE | Admit: 2011-12-29 | Discharge: 2011-12-30 | Disposition: A | Discharge status: Home / Self Care | Payer: Medicare HMO | Source: Ambulatory Visit | Attending: Orthopedic Surgery | Admitting: Orthopedic Surgery

## 2011-12-29 ENCOUNTER — Ambulatory Visit (HOSPITAL_COMMUNITY): Payer: Medicare HMO | Admitting: Anesthesiology

## 2011-12-29 ENCOUNTER — Encounter (HOSPITAL_COMMUNITY)
Admission: RE | Disposition: A | Discharge status: Home / Self Care | Payer: Self-pay | Source: Ambulatory Visit | Attending: Orthopedic Surgery

## 2011-12-29 DIAGNOSIS — S52023A Displaced fracture of olecranon process without intraarticular extension of unspecified ulna, initial encounter for closed fracture: Principal | ICD-10-CM | POA: Insufficient documentation

## 2011-12-29 DIAGNOSIS — I2510 Atherosclerotic heart disease of native coronary artery without angina pectoris: Secondary | ICD-10-CM | POA: Insufficient documentation

## 2011-12-29 DIAGNOSIS — Z472 Encounter for removal of internal fixation device: Secondary | ICD-10-CM | POA: Insufficient documentation

## 2011-12-29 DIAGNOSIS — I252 Old myocardial infarction: Secondary | ICD-10-CM | POA: Insufficient documentation

## 2011-12-29 DIAGNOSIS — Z8673 Personal history of transient ischemic attack (TIA), and cerebral infarction without residual deficits: Secondary | ICD-10-CM | POA: Insufficient documentation

## 2011-12-29 DIAGNOSIS — Z01818 Encounter for other preprocedural examination: Secondary | ICD-10-CM | POA: Insufficient documentation

## 2011-12-29 DIAGNOSIS — Z23 Encounter for immunization: Secondary | ICD-10-CM | POA: Insufficient documentation

## 2011-12-29 DIAGNOSIS — I10 Essential (primary) hypertension: Secondary | ICD-10-CM | POA: Insufficient documentation

## 2011-12-29 DIAGNOSIS — W19XXXA Unspecified fall, initial encounter: Secondary | ICD-10-CM | POA: Insufficient documentation

## 2011-12-29 DIAGNOSIS — G20A1 Parkinson's disease without dyskinesia, without mention of fluctuations: Secondary | ICD-10-CM | POA: Insufficient documentation

## 2011-12-29 DIAGNOSIS — G20 Parkinson's disease: Secondary | ICD-10-CM | POA: Diagnosis present

## 2011-12-29 DIAGNOSIS — Z01812 Encounter for preprocedural laboratory examination: Secondary | ICD-10-CM | POA: Insufficient documentation

## 2011-12-29 HISTORY — PX: HARDWARE REMOVAL: SHX979

## 2011-12-29 HISTORY — PX: ORIF ELBOW FRACTURE: SHX5031

## 2011-12-29 SURGERY — OPEN REDUCTION INTERNAL FIXATION (ORIF) ELBOW/OLECRANON FRACTURE
Anesthesia: General | Site: Elbow | Laterality: Right | Wound class: Clean

## 2011-12-29 MED ORDER — ACETAMINOPHEN 650 MG RE SUPP: 650.0000 mg | Freq: Four times a day (QID) | RECTAL | Status: DC | PRN

## 2011-12-29 MED ORDER — DROPERIDOL 2.5 MG/ML IJ SOLN: 0.6250 mg | INTRAMUSCULAR | Status: DC | PRN

## 2011-12-29 MED ORDER — METOCLOPRAMIDE HCL 5 MG/ML IJ SOLN: 5.0000 mg | Freq: Three times a day (TID) | INTRAMUSCULAR | Status: DC | PRN

## 2011-12-29 MED ORDER — METOCLOPRAMIDE HCL 5 MG/ML IJ SOLN
INTRAMUSCULAR | Status: DC | PRN
  Administered 2011-12-29: 10 mg via INTRAVENOUS

## 2011-12-29 MED ORDER — CEFAZOLIN SODIUM 1-5 GM-% IV SOLN
1.0000 g | Freq: Four times a day (QID) | INTRAVENOUS | Status: AC
  Administered 2011-12-29 – 2011-12-30 (×2): 1 g via INTRAVENOUS
  Filled 2011-12-29 (×3): qty 50

## 2011-12-29 MED ORDER — SENNA 8.6 MG PO TABS
1.0000 | ORAL_TABLET | Freq: Two times a day (BID) | ORAL | Status: DC
  Administered 2011-12-29 – 2011-12-30 (×2): 8.6 mg via ORAL
  Filled 2011-12-29 (×4): qty 1

## 2011-12-29 MED ORDER — GLYCOPYRROLATE 0.2 MG/ML IJ SOLN
INTRAMUSCULAR | Status: DC | PRN
  Administered 2011-12-29: 0.2 mg via INTRAVENOUS
  Administered 2011-12-29: .8 mg via INTRAVENOUS

## 2011-12-29 MED ORDER — OXYCODONE-ACETAMINOPHEN 5-325 MG PO TABS: 1.0000 | ORAL_TABLET | ORAL | Status: DC | PRN

## 2011-12-29 MED ORDER — LEVETIRACETAM 250 MG PO TABS
250.0000 mg | ORAL_TABLET | Freq: Two times a day (BID) | ORAL | Status: DC
  Administered 2011-12-29 – 2011-12-30 (×2): 250 mg via ORAL
  Filled 2011-12-29 (×3): qty 1

## 2011-12-29 MED ORDER — NEOSTIGMINE METHYLSULFATE 1 MG/ML IJ SOLN
INTRAMUSCULAR | Status: DC | PRN
  Administered 2011-12-29: 4 mg via INTRAVENOUS

## 2011-12-29 MED ORDER — METHOCARBAMOL 500 MG PO TABS: 500.0000 mg | ORAL_TABLET | Freq: Four times a day (QID) | ORAL | Status: DC | PRN

## 2011-12-29 MED ORDER — DEXAMETHASONE SODIUM PHOSPHATE 10 MG/ML IJ SOLN
INTRAMUSCULAR | Status: DC | PRN
  Administered 2011-12-29: 8 mg via INTRAVENOUS

## 2011-12-29 MED ORDER — 0.9 % SODIUM CHLORIDE (POUR BTL) OPTIME
TOPICAL | Status: DC | PRN
  Administered 2011-12-29: 1000 mL

## 2011-12-29 MED ORDER — PROPOFOL 10 MG/ML IV EMUL
INTRAVENOUS | Status: DC | PRN
  Administered 2011-12-29: 200 mg via INTRAVENOUS

## 2011-12-29 MED ORDER — ROPIVACAINE HCL 5 MG/ML IJ SOLN
INTRAMUSCULAR | Status: DC | PRN
  Administered 2011-12-29: 25 mL via EPIDURAL

## 2011-12-29 MED ORDER — MENTHOL 3 MG MT LOZG: 1.0000 | LOZENGE | OROMUCOSAL | Status: DC | PRN

## 2011-12-29 MED ORDER — ALUM & MAG HYDROXIDE-SIMETH 200-200-20 MG/5ML PO SUSP: 30.0000 mL | ORAL | Status: DC | PRN

## 2011-12-29 MED ORDER — AMLODIPINE BESYLATE 2.5 MG PO TABS
2.5000 mg | ORAL_TABLET | Freq: Every day | ORAL | Status: DC
  Filled 2011-12-29: qty 1

## 2011-12-29 MED ORDER — METHOCARBAMOL 100 MG/ML IJ SOLN: 500.0000 mg | Freq: Four times a day (QID) | INTRAVENOUS | Status: DC | PRN

## 2011-12-29 MED ORDER — DOCUSATE SODIUM 100 MG PO CAPS
100.0000 mg | ORAL_CAPSULE | Freq: Two times a day (BID) | ORAL | Status: DC
  Administered 2011-12-29 – 2011-12-30 (×2): 100 mg via ORAL
  Filled 2011-12-29 (×2): qty 1

## 2011-12-29 MED ORDER — CARBIDOPA-LEVODOPA 25-100 MG PO TABS
1.0000 | ORAL_TABLET | Freq: Three times a day (TID) | ORAL | Status: DC
  Administered 2011-12-29 – 2011-12-30 (×2): 1 via ORAL
  Filled 2011-12-29 (×5): qty 1

## 2011-12-29 MED ORDER — FENTANYL CITRATE 0.05 MG/ML IJ SOLN
INTRAMUSCULAR | Status: DC | PRN
  Administered 2011-12-29: 50 ug via INTRAVENOUS
  Administered 2011-12-29: 100 ug via INTRAVENOUS

## 2011-12-29 MED ORDER — PNEUMOCOCCAL VAC POLYVALENT 25 MCG/0.5ML IJ INJ
0.5000 mL | INJECTION | INTRAMUSCULAR | Status: AC
  Administered 2011-12-30: 0.5 mL via INTRAMUSCULAR
  Filled 2011-12-29: qty 0.5

## 2011-12-29 MED ORDER — MUPIROCIN CALCIUM 2 % EX CREA: 1.0000 "application " | TOPICAL_CREAM | Freq: Every day | CUTANEOUS | Status: DC | PRN

## 2011-12-29 MED ORDER — PHENYLEPHRINE HCL 10 MG/ML IJ SOLN
INTRAMUSCULAR | Status: DC | PRN
  Administered 2011-12-29 (×2): 40 ug via INTRAVENOUS

## 2011-12-29 MED ORDER — CLOPIDOGREL BISULFATE 75 MG PO TABS
75.0000 mg | ORAL_TABLET | Freq: Every day | ORAL | Status: DC
  Administered 2011-12-30: 75 mg via ORAL
  Filled 2011-12-29 (×2): qty 1

## 2011-12-29 MED ORDER — PHENOL 1.4 % MT LIQD: 1.0000 | OROMUCOSAL | Status: DC | PRN

## 2011-12-29 MED ORDER — VALSARTAN-HYDROCHLOROTHIAZIDE 160-12.5 MG PO TABS: 1.0000 | ORAL_TABLET | Freq: Every day | ORAL | Status: DC

## 2011-12-29 MED ORDER — DOCUSATE SODIUM 100 MG PO CAPS
100.0000 mg | ORAL_CAPSULE | Freq: Two times a day (BID) | ORAL | Status: DC
  Administered 2011-12-29 (×2): 100 mg via ORAL
  Filled 2011-12-29 (×3): qty 1

## 2011-12-29 MED ORDER — LACTATED RINGERS IV SOLN
INTRAVENOUS | Status: DC | PRN
  Administered 2011-12-29 (×2): via INTRAVENOUS

## 2011-12-29 MED ORDER — OXYCODONE HCL 5 MG PO TABS
5.0000 mg | ORAL_TABLET | ORAL | Status: DC | PRN
  Administered 2011-12-29 – 2011-12-30 (×3): 5 mg via ORAL
  Filled 2011-12-29 (×3): qty 1

## 2011-12-29 MED ORDER — METOCLOPRAMIDE HCL 10 MG PO TABS: 5.0000 mg | ORAL_TABLET | Freq: Three times a day (TID) | ORAL | Status: DC | PRN

## 2011-12-29 MED ORDER — FLUTICASONE PROPIONATE 0.05 % EX CREA: 1.0000 "application " | TOPICAL_CREAM | Freq: Two times a day (BID) | CUTANEOUS | Status: DC | PRN

## 2011-12-29 MED ORDER — CARVEDILOL 3.125 MG PO TABS
3.1250 mg | ORAL_TABLET | Freq: Two times a day (BID) | ORAL | Status: DC
  Administered 2011-12-30: 3.125 mg via ORAL
  Filled 2011-12-29 (×4): qty 1

## 2011-12-29 MED ORDER — HYDROCHLOROTHIAZIDE 12.5 MG PO CAPS
12.5000 mg | ORAL_CAPSULE | Freq: Every day | ORAL | Status: DC
  Filled 2011-12-29: qty 1

## 2011-12-29 MED ORDER — ZOLPIDEM TARTRATE 5 MG PO TABS: 5.0000 mg | ORAL_TABLET | Freq: Every evening | ORAL | Status: DC | PRN

## 2011-12-29 MED ORDER — HYDROMORPHONE HCL PF 1 MG/ML IJ SOLN: 0.2500 mg | INTRAMUSCULAR | Status: DC | PRN

## 2011-12-29 MED ORDER — ASPIRIN EC 81 MG PO TBEC
81.0000 mg | DELAYED_RELEASE_TABLET | Freq: Every day | ORAL | Status: DC
  Administered 2011-12-30: 81 mg via ORAL
  Filled 2011-12-29: qty 1

## 2011-12-29 MED ORDER — IRBESARTAN 150 MG PO TABS
150.0000 mg | ORAL_TABLET | Freq: Every day | ORAL | Status: DC
  Filled 2011-12-29: qty 1

## 2011-12-29 MED ORDER — VECURONIUM BROMIDE 10 MG IV SOLR
INTRAVENOUS | Status: DC | PRN
  Administered 2011-12-29: 3 mg via INTRAVENOUS
  Administered 2011-12-29: 4 mg via INTRAVENOUS
  Administered 2011-12-29: 7 mg via INTRAVENOUS

## 2011-12-29 MED ORDER — HYDROMORPHONE HCL PF 1 MG/ML IJ SOLN
0.5000 mg | INTRAMUSCULAR | Status: DC | PRN
  Administered 2011-12-29: 1 mg via INTRAVENOUS
  Filled 2011-12-29: qty 1

## 2011-12-29 MED ORDER — CLOBETASOL PROPIONATE 0.05 % EX SOLN: 1.0000 "application " | CUTANEOUS | Status: DC | PRN

## 2011-12-29 MED ORDER — ACETAMINOPHEN 325 MG PO TABS: 650.0000 mg | ORAL_TABLET | Freq: Four times a day (QID) | ORAL | Status: DC | PRN

## 2011-12-29 MED ORDER — POTASSIUM CHLORIDE IN NACL 20-0.45 MEQ/L-% IV SOLN
INTRAVENOUS | Status: DC
  Administered 2011-12-29: 1000 mL via INTRAVENOUS
  Filled 2011-12-29 (×3): qty 1000

## 2011-12-29 MED ORDER — MIDAZOLAM HCL 5 MG/5ML IJ SOLN
INTRAMUSCULAR | Status: DC | PRN
  Administered 2011-12-29: 1 mg via INTRAVENOUS

## 2011-12-29 MED ORDER — LIDOCAINE HCL 1 % IJ SOLN
INTRAMUSCULAR | Status: DC | PRN
  Administered 2011-12-29: 2 mL via INTRADERMAL

## 2011-12-29 MED ORDER — ONDANSETRON HCL 4 MG/2ML IJ SOLN: 4.0000 mg | Freq: Four times a day (QID) | INTRAMUSCULAR | Status: DC | PRN

## 2011-12-29 MED ORDER — ONDANSETRON HCL 4 MG PO TABS: 4.0000 mg | ORAL_TABLET | Freq: Four times a day (QID) | ORAL | Status: DC | PRN

## 2011-12-29 MED ORDER — EPHEDRINE SULFATE 50 MG/ML IJ SOLN
INTRAMUSCULAR | Status: DC | PRN
  Administered 2011-12-29 (×2): 10 mg via INTRAVENOUS
  Administered 2011-12-29: 5 mg via INTRAVENOUS

## 2011-12-29 MED ORDER — ATORVASTATIN CALCIUM 40 MG PO TABS
40.0000 mg | ORAL_TABLET | Freq: Every day | ORAL | Status: DC
  Filled 2011-12-29 (×2): qty 1

## 2011-12-29 MED ORDER — ONDANSETRON HCL 4 MG/2ML IJ SOLN
INTRAMUSCULAR | Status: DC | PRN
  Administered 2011-12-29: 4 mg via INTRAVENOUS

## 2011-12-29 MED ORDER — DIPHENHYDRAMINE HCL 12.5 MG/5ML PO ELIX: 12.5000 mg | ORAL_SOLUTION | ORAL | Status: DC | PRN

## 2011-12-29 MED ORDER — ENOXAPARIN SODIUM 40 MG/0.4ML ~~LOC~~ SOLN
40.0000 mg | SUBCUTANEOUS | Status: DC
  Administered 2011-12-30: 40 mg via SUBCUTANEOUS
  Filled 2011-12-29 (×2): qty 0.4

## 2011-12-29 MED ORDER — NITROGLYCERIN 0.4 MG SL SUBL: 0.4000 mg | SUBLINGUAL_TABLET | SUBLINGUAL | Status: DC | PRN

## 2011-12-29 SURGICAL SUPPLY — 100 items
APL SKNCLS STERI-STRIP NONHPOA (GAUZE/BANDAGES/DRESSINGS) ×1
BAG DECANTER FOR FLEXI CONT (MISCELLANEOUS) IMPLANT
BANDAGE ELASTIC 3 VELCRO ST LF (GAUZE/BANDAGES/DRESSINGS) ×1 IMPLANT
BANDAGE ELASTIC 4 VELCRO ST LF (GAUZE/BANDAGES/DRESSINGS) ×3 IMPLANT
BANDAGE ELASTIC 6 VELCRO ST LF (GAUZE/BANDAGES/DRESSINGS) IMPLANT
BANDAGE ESMARK 6X9 LF (GAUZE/BANDAGES/DRESSINGS) IMPLANT
BENZOIN TINCTURE PRP APPL 2/3 (GAUZE/BANDAGES/DRESSINGS) ×2 IMPLANT
BIT DRILL 5/64X5 DISP (BIT) ×1 IMPLANT
BLADE SURG 15 STRL LF DISP TIS (BLADE) ×1 IMPLANT
BLADE SURG 15 STRL SS (BLADE)
BNDG CMPR 9X4 STRL LF SNTH (GAUZE/BANDAGES/DRESSINGS) ×1
BNDG CMPR 9X6 STRL LF SNTH (GAUZE/BANDAGES/DRESSINGS)
BNDG CMPR MD 5X2 ELC HKLP STRL (GAUZE/BANDAGES/DRESSINGS)
BNDG COHESIVE 4X5 TAN STRL (GAUZE/BANDAGES/DRESSINGS) ×1 IMPLANT
BNDG ELASTIC 2 VLCR STRL LF (GAUZE/BANDAGES/DRESSINGS) ×1 IMPLANT
BNDG ESMARK 4X9 LF (GAUZE/BANDAGES/DRESSINGS) ×2 IMPLANT
BNDG ESMARK 6X9 LF (GAUZE/BANDAGES/DRESSINGS)
CANISTER SUCTION 1200CC (MISCELLANEOUS) ×2 IMPLANT
CANISTER SUCTION 2500CC (MISCELLANEOUS) ×1 IMPLANT
CATH ROBINSON RED A/P 14FR (CATHETERS) ×1 IMPLANT
CLOTH BEACON ORANGE TIMEOUT ST (SAFETY) ×2 IMPLANT
COVER SURGICAL LIGHT HANDLE (MISCELLANEOUS) ×2 IMPLANT
CUFF TOURNIQUET SINGLE 18IN (TOURNIQUET CUFF) ×1 IMPLANT
CUFF TOURNIQUET SINGLE 24IN (TOURNIQUET CUFF) IMPLANT
CUFF TOURNIQUET SINGLE 34IN LL (TOURNIQUET CUFF) IMPLANT
DRAPE C-ARM 42X72 X-RAY (DRAPES) ×2 IMPLANT
DRAPE INCISE IOBAN 66X45 STRL (DRAPES) ×4 IMPLANT
DRAPE ORTHO SPLIT 77X108 STRL (DRAPES)
DRAPE SURG ORHT 6 SPLT 77X108 (DRAPES) ×1 IMPLANT
DRAPE U-SHAPE 47X51 STRL (DRAPES) ×3 IMPLANT
DURAPREP 26ML APPLICATOR (WOUND CARE) ×2 IMPLANT
ELECT REM PT RETURN 9FT ADLT (ELECTROSURGICAL) ×2
ELECTRODE REM PT RTRN 9FT ADLT (ELECTROSURGICAL) ×1 IMPLANT
GAUZE XEROFORM 1X8 LF (GAUZE/BANDAGES/DRESSINGS) ×2 IMPLANT
GLOVE BIO SURGEON STRL SZ7 (GLOVE) ×1 IMPLANT
GLOVE BIOGEL PI IND STRL 6.5 (GLOVE) IMPLANT
GLOVE BIOGEL PI IND STRL 8 (GLOVE) ×2 IMPLANT
GLOVE BIOGEL PI INDICATOR 6.5 (GLOVE) ×4
GLOVE BIOGEL PI INDICATOR 8 (GLOVE) ×1
GLOVE ORTHO TXT STRL SZ7.5 (GLOVE) ×2 IMPLANT
GLOVE SURG ORTHO 8.0 STRL STRW (GLOVE) ×3 IMPLANT
GOWN PREVENTION PLUS XLARGE (GOWN DISPOSABLE) ×2 IMPLANT
GOWN PREVENTION PLUS XXLARGE (GOWN DISPOSABLE) ×2 IMPLANT
GOWN STRL NON-REIN LRG LVL3 (GOWN DISPOSABLE) IMPLANT
IMMOBILIZER KNEE 24 THIGH 36 (MISCELLANEOUS) ×1 IMPLANT
IMMOBILIZER KNEE 24 UNIV (MISCELLANEOUS)
KIT BASIN OR (CUSTOM PROCEDURE TRAY) ×2 IMPLANT
KIT ROOM TURNOVER OR (KITS) ×2 IMPLANT
NDL 1/2 CIR CATGUT .05X1.09 (NEEDLE) IMPLANT
NDL HYPO 25X1 1.5 SAFETY (NEEDLE) IMPLANT
NDL SUT 6 .5 CRC .975X.05 MAYO (NEEDLE) IMPLANT
NEEDLE 1/2 CIR CATGUT .05X1.09 (NEEDLE) ×2 IMPLANT
NEEDLE HYPO 25X1 1.5 SAFETY (NEEDLE) IMPLANT
NEEDLE MAYO TAPER (NEEDLE)
NS IRRIG 1000ML POUR BTL (IV SOLUTION) ×2 IMPLANT
PACK GENERAL/GYN (CUSTOM PROCEDURE TRAY) ×1 IMPLANT
PACK ORTHO EXTREMITY (CUSTOM PROCEDURE TRAY) ×2 IMPLANT
PAD ARMBOARD 7.5X6 YLW CONV (MISCELLANEOUS) ×4 IMPLANT
PAD CAST 3X4 CTTN HI CHSV (CAST SUPPLIES) ×1 IMPLANT
PAD CAST 4YDX4 CTTN HI CHSV (CAST SUPPLIES) ×1 IMPLANT
PADDING CAST ABS 4INX4YD NS (CAST SUPPLIES) ×1
PADDING CAST ABS COTTON 4X4 ST (CAST SUPPLIES) ×1 IMPLANT
PADDING CAST COTTON 3X4 STRL (CAST SUPPLIES) ×2
PADDING CAST COTTON 4X4 STRL (CAST SUPPLIES) ×2
PADDING CAST COTTON 6X4 STRL (CAST SUPPLIES) IMPLANT
PADDING UNDERCAST 2  STERILE (CAST SUPPLIES) ×2 IMPLANT
SHEET MEDIUM DRAPE 40X70 STRL (DRAPES) ×1 IMPLANT
SPLINT PLASTER CAST XFAST 5X30 (CAST SUPPLIES) IMPLANT
SPLINT PLASTER XFAST SET 5X30 (CAST SUPPLIES) ×1
SPONGE GAUZE 4X4 12PLY (GAUZE/BANDAGES/DRESSINGS) ×2 IMPLANT
SPONGE LAP 4X18 X RAY DECT (DISPOSABLE) ×2 IMPLANT
STAPLER VISISTAT (STAPLE) IMPLANT
STAPLER VISISTAT 35W (STAPLE) IMPLANT
STOCKINETTE IMPERVIOUS 9X36 MD (GAUZE/BANDAGES/DRESSINGS) ×1 IMPLANT
STRIP CLOSURE SKIN 1/2X4 (GAUZE/BANDAGES/DRESSINGS) ×2 IMPLANT
SUCTION FRAZIER TIP 10 FR DISP (SUCTIONS) ×2 IMPLANT
SUT ETHIBOND 0 MO6 C/R (SUTURE) IMPLANT
SUT ETHILON 3 0 PS 1 (SUTURE) IMPLANT
SUT ETHILON 4 0 PS 2 18 (SUTURE) IMPLANT
SUT FIBERWIRE #2 38 T-5 BLUE (SUTURE) ×2
SUT MNCRL AB 4-0 PS2 18 (SUTURE) ×2 IMPLANT
SUT VIC AB 0 CT1 27 (SUTURE) ×4
SUT VIC AB 0 CT1 27XBRD ANBCTR (SUTURE) ×1 IMPLANT
SUT VIC AB 0 SH 27 (SUTURE) IMPLANT
SUT VIC AB 2-0 SH 27 (SUTURE)
SUT VIC AB 2-0 SH 27XBRD (SUTURE) IMPLANT
SUT VIC AB 3-0 SH 18 (SUTURE) IMPLANT
SUT VIC AB 3-0 SH 27 (SUTURE) ×2
SUT VIC AB 3-0 SH 27X BRD (SUTURE) ×1 IMPLANT
SUT VICRYL 3-0 CR8 SH (SUTURE) ×1 IMPLANT
SUTURE FIBERWR #2 38 T-5 BLUE (SUTURE) IMPLANT
SYR BULB 3OZ (MISCELLANEOUS) ×1 IMPLANT
SYR CONTROL 10ML LL (SYRINGE) IMPLANT
TOWEL OR 17X24 6PK STRL BLUE (TOWEL DISPOSABLE) ×2 IMPLANT
TOWEL OR 17X26 10 PK STRL BLUE (TOWEL DISPOSABLE) ×2 IMPLANT
TUBE CONNECTING 12X1/4 (SUCTIONS) ×2 IMPLANT
TUBE SUCT ARGYLE STRL (TUBING) ×2 IMPLANT
UNDERPAD 30X30 INCONTINENT (UNDERPADS AND DIAPERS) ×2 IMPLANT
WATER STERILE IRR 1000ML POUR (IV SOLUTION) ×1 IMPLANT
WIRE .062X9 INCH SMOOTH (Wire) ×2 IMPLANT

## 2011-12-29 NOTE — Transfer of Care (Signed)
Immediate Anesthesia Transfer of Care Note  Patient: Mark [name]  Procedure(s) Performed: Procedure(s) (LRB): OPEN REDUCTION INTERNAL FIXATION (ORIF) ELBOW/OLECRANON FRACTURE (Right) HARDWARE REMOVAL (Right)  Patient Location: PACU  Anesthesia Type: General  Level of Consciousness: awake, alert  and oriented  Airway & Oxygen Therapy: Patient Spontanous Breathing  Post-op Assessment: Report given to PACU RN, Post -op Vital signs reviewed and stable and Patient moving all extremities X 4  Post vital signs: Reviewed and stable  Complications: No apparent anesthesia complications

## 2011-12-29 NOTE — Brief Op Note (Signed)
12/29/2011  2:50 PM  PATIENT:  Mark [name]    PRE-OPERATIVE DIAGNOSIS:  OLECRANON FRACTURE RIGHT ELBOW   POST-OPERATIVE DIAGNOSIS:  Same  PROCEDURE:  OPEN REDUCTION INTERNAL FIXATION (ORIF) ELBOW/OLECRANON FRACTURE, HARDWARE REMOVAL  SURGEON:  Eulas Post, MD  PHYSICIAN ASSISTANT: Janace Litten, OPA-C, present and scrubbed throughout the case, critical for completion in a timely fashion, and for retraction, instrumentation, and closure.  ANESTHESIA:   General

## 2011-12-29 NOTE — Progress Notes (Signed)
Orthopedic Tech Progress Note Patient Details:  Mark [name] [birth year] [id number]  Patient ID: Mark [name], male   DOB: 08-08-1946, 66 y.o.   MRN: [id number] Trapeze bar patient helper  Nikki Dom 12/29/2011, 3:56 PM

## 2011-12-29 NOTE — Anesthesia Preprocedure Evaluation (Addendum)
Anesthesia Evaluation  Patient identified by MRN, date of birth, ID band Patient awake    Reviewed: Allergy & Precautions, H&P , NPO status , Patient's Chart, lab work & pertinent test results  History of Anesthesia Complications Negative for: history of anesthetic complications  Airway Mallampati: II TM Distance: >3 FB Neck ROM: Full    Dental  (+) Dental Advidsory Given and Teeth Intact   Pulmonary neg pulmonary ROS,  breath sounds clear to auscultation  Pulmonary exam normal       Cardiovascular hypertension, + CAD and + Past MI Rhythm:Regular Rate:Normal - Systolic murmurs Hx MI medically managed, normal EF   Neuro/Psych Seizures -, Well Controlled,  Parkinsonism CVA, No Residual Symptoms    GI/Hepatic negative GI ROS, Neg liver ROS,   Endo/Other  negative endocrine ROS  Renal/GU negative Renal ROS     Musculoskeletal   Abdominal   Peds  Hematology negative hematology ROS (+)   Anesthesia Other Findings   Reproductive/Obstetrics                          Anesthesia Physical Anesthesia Plan  ASA: III  Anesthesia Plan: General   Post-op Pain Management:    Induction: Intravenous  Airway Management Planned:   Additional Equipment:   Intra-op Plan:   Post-operative Plan: Extubation in OR  Informed Consent: I have reviewed the patients History and Physical, chart, labs and discussed the procedure including the risks, benefits and alternatives for the proposed anesthesia with the patient or authorized representative who has indicated his/her understanding and acceptance.   Dental advisory given and Dental Advisory Given  Plan Discussed with: Anesthesiologist, CRNA and Surgeon  Anesthesia Plan Comments:        Anesthesia Quick Evaluation

## 2011-12-29 NOTE — Preoperative (Signed)
Beta Blockers   Reason not to administer Beta Blockers:Not Applicable 

## 2011-12-29 NOTE — Anesthesia Postprocedure Evaluation (Signed)
Anesthesia Post Note  Patient: Mark [name]  Procedure(s) Performed: Procedure(s) (LRB): OPEN REDUCTION INTERNAL FIXATION (ORIF) ELBOW/OLECRANON FRACTURE (Right) HARDWARE REMOVAL (Right)  Anesthesia type: General  Patient location: PACU  Post pain: Pain level controlled and Adequate analgesia  Post assessment: Post-op Vital signs reviewed, Patient's Cardiovascular Status Stable, Respiratory Function Stable, Patent Airway and Pain level controlled  Last Vitals:  Filed Vitals:   12/29/11 1341  BP: 140/79  Pulse:   Temp: 36.5 C  Resp:     Post vital signs: Reviewed and stable  Level of consciousness: awake, alert  and oriented  Complications: No apparent anesthesia complications

## 2011-12-29 NOTE — Progress Notes (Signed)
Orthopedic Tech Progress Note Patient Details:  Mark [name] 1945-10-20 [id number]  Patient ID: Mark [name], male   DOB: 02-18-1946, 66 y.o.   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

## 2011-12-29 NOTE — H&P (Signed)
PREOPERATIVE H&P  Chief Complaint: OLECRANON FRACTURE RIGHT ELBOW   HPI: Mark [name] is a 66 y.o. male who presents for preoperative history and physical with a diagnosis of OLECRANON FRACTURE RIGHT ELBOW . Symptoms are rated as moderate to severe, and have been worsening.  This is significantly impairing activities of daily living.  He has elected for surgical management. He had a previous ORIF, done about 8 weeks ago, however he has severe Parkinson syndrome, and fell directly on that arm, resulting in loss of fixation. We have had an extensive discussion regarding nonoperative care versus surgical intervention and he very clearly has stated that he wants surgical intervention.  Past Medical History  Diagnosis Date  . Hypertension   . PAF (paroxysmal atrial fibrillation)   . Parkinson's disease   . Gout   . Coronary artery disease     SEVERE 3-VESSEL DX WITH NORMAL EF  . Fall   . Fracture of right olecranon process 10/23/2011  . Non-ST elevated myocardial infarction (non-STEMI)   . Stones in the urinary tract   . Seizures     12/10/11 HAD SEIZURE  . Skin cancer     left shoulder  . Arthritis   . Stroke 10/12    Hx of remote stroke, RT SIDED WEAKNESS   Past Surgical History  Procedure Date  . Coronary stent placement 2000  . Cardiac catheterization 06/26/2011    NORMAL LV SYSTOLIC FUNCTION. RCA IS LARGE AND DOMINANT  . Transthoracic echocardiogram 06/26/2011    EF 55-60%  . Orif elbow fracture 10/23/2011    Procedure: OPEN REDUCTION INTERNAL FIXATION (ORIF) ELBOW/OLECRANON FRACTURE;  Surgeon: Eulas Post, MD;  Location: [facility] OR;  Service: Orthopedics;  Laterality: Right;   History   Social History  . Marital Status: Married    Spouse Name: N/A    Number of Children: N/A  . Years of Education: N/A   Social History Main Topics  . Smoking status: Never Smoker   . Smokeless tobacco: None  . Alcohol Use: No  . Drug Use: No  . Sexually Active: No   Other Topics  Concern  . None   Social History Narrative  . None   Family History  Problem Relation Age of Onset  . Heart disease    . Arthritis     No Known Allergies Prior to Admission medications   Medication Sig Start Date End Date Taking? Authorizing Provider  amLODipine (NORVASC) 5 MG tablet Take 2.5 mg by mouth daily. 10/15/11  Yes Rosalio Macadamia, NP  aspirin EC 81 MG tablet Take 81 mg by mouth daily.     Yes Historical Provider, MD  BACTROBAN 2 % Apply 1 application topically daily as needed.  07/23/11  Yes Historical Provider, MD  carbidopa-levodopa (SINEMET) 25-100 MG per tablet Take 1 tablet by mouth Three times a day. 09/23/11  Yes Historical Provider, MD  carvedilol (COREG) 3.125 MG tablet Take 3.125 mg by mouth 2 (two) times daily with a meal.     Yes Historical Provider, MD  clobetasol (TEMOVATE) 0.05 % external solution Apply 1 application topically every other day as needed. For scalp   Yes Historical Provider, MD  clopidogrel (PLAVIX) 75 MG tablet Take 75 mg by mouth Daily. 09/23/11  Yes Historical Provider, MD  docusate sodium (COLACE) 100 MG capsule Take 100 mg by mouth 2 (two) times daily.   Yes Historical Provider, MD  fluticasone (CUTIVATE) 0.05 % cream Apply 1 application topically 2 (two) times daily  as needed. Apply to affected areas on face   Yes Historical Provider, MD  HYDROcodone-acetaminophen (NORCO) 7.5-325 MG per tablet Take 1 tablet by mouth Once daily as needed. FOR PAIN 10/14/11  Yes Historical Provider, MD  levETIRAcetam (KEPPRA) 250 MG tablet Take 250 mg by mouth Twice daily.  09/23/11  Yes Historical Provider, MD  nitroGLYCERIN (NITROSTAT) 0.4 MG SL tablet Place 0.4 mg under the tongue every 5 (five) minutes x 3 doses as needed. For chest pain   Yes Historical Provider, MD  rosuvastatin (CRESTOR) 20 MG tablet Take 20 mg by mouth daily.     Yes Historical Provider, MD  valsartan-hydrochlorothiazide (DIOVAN-HCT) 160-12.5 MG per tablet Take 1 tablet by mouth daily.    Yes  Historical Provider, MD     Positive ROS: All other systems have been reviewed and were otherwise negative with the exception of those mentioned in the HPI and as above.  Physical Exam: General: Alert, no acute distress Cardiovascular: No pedal edema Respiratory: No cyanosis, no use of accessory musculature GI: No organomegaly, abdomen is soft and non-tender Skin: No lesions in the area of chief complaint Neurologic: Sensation intact distally. He has classic parkinsonian features with masked facies, substantial cogwheel rigidity, Psychiatric: Patient is competent for consent with normal mood and affect Lymphatic: No axillary or cervical lymphadenopathy  MUSCULOSKELETAL: Right elbow has swelling with pain to palpation and very limited motion.  Assessment: OLECRANON FRACTURE RIGHT ELBOW   Plan: Plan for Procedure(s): OPEN REDUCTION INTERNAL FIXATION (ORIF) ELBOW/OLECRANON FRACTURE HARDWARE REMOVAL  The risks benefits and alternatives were discussed with the patient including but not limited to the risks of nonoperative treatment, versus surgical intervention including infection, bleeding, nerve injury, malunion, nonunion, the need for revision surgery, hardware prominence, hardware failure, the need for hardware removal, blood clots, cardiopulmonary complications, morbidity, mortality, among others, and they were willing to proceed.    Eulas Post, MD 12/29/2011 11:04 AM

## 2011-12-29 NOTE — Op Note (Signed)
NAMEWENDEL, HOMEYER                ACCOUNT NO.:  [id number]  MEDICAL RECORD NO.:  [id number]  LOCATION:  5039                         FACILITY:  [facility]  PHYSICIAN:  Eulas Post, MD    DATE OF BIRTH:  1946-05-04  DATE OF PROCEDURE:  12/29/2011 DATE OF DISCHARGE:                              OPERATIVE REPORT   PREOPERATIVE DIAGNOSIS:  Right recurrent olecranon fracture with prominent hardware.  POSTOPERATIVE DIAGNOSIS:  Right recurrent olecranon fracture with prominent hardware.  OPERATIVE PROCEDURE:  Revision of right olecranon open reduction and internal fixation with removal of hardware.  ANESTHESIA:  General.  ESTIMATED BLOOD LOSS:  75 mL.  OPERATIVE IMPLANTS:  I removed an Acumed Locking Plate with multiple interlocking screws and I replaced this with a FiberWire suture going up and down the triceps tendon through drill holes in the ulnar shaft, as well as using a tension band using two 18-gauge wires and two 0.0625 inch K-wires for the tension band construct.  PREOPERATIVE INDICATIONS:  Mr. Talha Iser is a 66 year old gentleman with end-stage Parkinson disorder, who had an olecranon fracture that underwent ORIF almost 8 weeks ago.  Unfortunately, he was walking and he fell directly on his outstretched arm.  He suffered recurrent fracture of the olecranon.  He elected for surgical management.  I had a long discussion with him including the option of nonsurgical management; however, he was clear that he adamantly wanted the plate removed and wanted his elbow fixed if it would help his pain at all.  Therefore, he elected for surgical management.  The risks, benefits, and alternatives discussed before the procedure including, but not limited to, risks of infection, bleeding, nerve injury, malunion, nonunion, hardware prominence, hardware failure, need for hardware removal, recurrent olecranon fracture, incomplete relief, incomplete return of function, ongoing  dysfunction of the upper extremity, among others and he is willing to proceed.  ATTENDING SURGEON:  Eulas Post, M.D.  FIRST ASSISTANT:  Janace Litten, Orthopedic PA-C, present and scrubbed throughout the case.  Assisting with instrumentation as well as exposure, reduction, and closure.  DESCRIPTION OF PROCEDURE:  The patient was brought to the operating room and placed in supine position.  General anesthesia was administered.  IV antibiotics were given.  The patient was in the semilateral decubitus position and the right upper extremity was prepped and draped in the usual sterile fashion.  Time-out was performed.  The arm was elevated, exsanguinated, and the tourniquet was inflated.  Incision was made through his previous incision.  The fracture fragments were identified and exposed.  The olecranon had split into 2 pieces at least.  The ulnar plate was removed with all the hardware without difficulty.  I used a #2 FiberWire going through the bone fragment going up and down the triceps tendon and through the other side of the bone fragment, bringing this entire construct back together and then brought it out through drill holes passed through the ulnar shaft.  I used this as secondary fixation and then reduced the fragment anatomically, and then placed a total of 2 K-wires down the shaft.  I did get bicortical purchase.  Initially, I tried to go to the anterior cortex;  however, the fracture segment was distal and I was not able to gain purchase through the bony segments in that angle, so instead I placed them going out the posterior surface.  I did this intentionally, despite the risk that these could be prominent, but I did feel the getting bicortical purchase was more important given the fact that this was a revision surgery.  Once I had fixation, I then drilled a hole in the shaft and placed two 18-gauge wires in a cerclage figure-of-eight fashion and tensioned them  appropriately passing them using a 14-gauge Angiocath around the back of the wires.  Once I had secured fixation and the tension band was appropriately tensioned, I then secured the FiberWire and this provided excellent primary and secondary fixation.  The entire olecranon moved as a unit and I used C- arm to confirm appropriate anatomic alignment and position of the hardware.  The tension band was cut on the radial aspect of the ulna and then bent and then positioned in order to minimize prominence.  The K- wires were bent and swiveled cut and then tamped down through the triceps tendon, gaining secure apposition against the wire.  Excellent overall fixation was obtained and I irrigated the wounds copiously and repaired the subcutaneous tissue with Vicryl, followed by Steri-Strips and sterile gauze for the skin.  A posterior splint with the arm in slight extension was applied.  He tolerated this well.  There were no complications.     Eulas Post, MD     JPL/MEDQ  D:  12/29/2011  T:  12/29/2011  Job:  [id number]

## 2011-12-29 NOTE — Anesthesia Procedure Notes (Addendum)
Anesthesia Regional Block:  Supraclavicular block  Pre-Anesthetic Checklist: ,, timeout performed, Correct Patient, Correct Site, Correct Laterality, Correct Procedure, Correct Position, site marked, Risks and benefits discussed,  Surgical consent,  Pre-op evaluation,  At surgeon's request and post-op pain management  Laterality: Right  Prep: chloraprep       Needles:   Needle Type: Other   (Arrow Echogenic)   Needle Length: 9cm  Needle Gauge: 21    Additional Needles:  Procedures: ultrasound guided Supraclavicular block Narrative:  Start time: 12/29/2011 9:48 AM End time: 12/29/2011 9:56 AM Injection made incrementally with aspirations every 5 mL.  Performed by: Personally  Anesthesiologist: C Frederick  Additional Notes: Ultrasound guidance used to: id relevant anatomy, confirm needle position, local anesthetic spread, avoidance of vascular puncture. Picture saved. No complications. Block performed personally by Janetta Hora. Gelene Mink, MD    Supraclavicular block Procedure Name: Intubation Date/Time: 12/29/2011 11:20 AM Performed by: Carmela Rima Pre-anesthesia Checklist: Emergency Drugs available, Patient identified, Timeout performed, Suction available and Patient being monitored Patient Re-evaluated:Patient Re-evaluated prior to inductionOxygen Delivery Method: Circle system utilized Preoxygenation: Pre-oxygenation with 100% oxygen Intubation Type: IV induction Ventilation: Mask ventilation without difficulty Laryngoscope Size: Mac and 3 Grade View: Grade II Tube type: Oral Tube size: 7.5 mm Number of attempts: 1 Placement Confirmation: ETT inserted through vocal cords under direct vision,  positive ETCO2 and breath sounds checked- equal and bilateral Secured at: 23 cm Tube secured with: Tape Dental Injury: Teeth and Oropharynx as per pre-operative assessment

## 2011-12-29 NOTE — Progress Notes (Signed)
Orthopedic Tech Progress Note Patient Details:  Mark [name] 01-11-1946 [id number]  Other Ortho Devices Type of Ortho Device: Other (comment) (sling immobilizer) Ortho Device Location: right arm Ortho Device Interventions: Application   [name] 12/29/2011, 3:30 PM

## 2011-12-29 NOTE — Discharge Instructions (Signed)
Elbow Fracture with Open Reduction and Internal Fixation (ORIF) A fracture (break in bone) of the elbow means one of the bones that comprises the elbow is broken. If fractures are not displaced (separated), they may be treated conservatively. That means that only a sling or splint may be required for two to three weeks. Often, elbow fractures are treated by early range of motion exercises to prevent the elbow from getting stiff. If fractures are large and not stable, an operation may be required to put the bones back into proper position and hold them in place with:  Pins.   Plates.   Screws.  This is called open reduction and internal fixation (ORIF). The main goal of treating fractured elbows is to get the bones back into position and keep them in place. This goal gives the best chance of an elbow that:  Works as normally as possible.   Has the optimum range of motion.  DIAGNOSIS  The diagnosis of a fractured elbow is made by x-ray. These will be required before and after the elbow is fixed. RISKS AND COMPLICATIONS All surgery is associated with risks. Some of these risks are:  Excessive bleeding.   Failure to heal properly (non-union).   Infection.   Stiffness of elbow following injury.   Damage to one of the nerves around the elbow producing numbness or weakness.  LET YOUR CAREGIVER KNOW ABOUT:  Allergies.   Medications taken including herbs, eye drops, over the counter medications, and creams.   Use of steroids (by mouth or creams).   Previous problems with anesthetics or novocaine.   Any numbness or tingling in your hand/forearm.   Possibility of pregnancy, if this applies.   History of blood clots (thrombophlebitis).   History of bleeding or blood problems.   Previous surgery.   Other health problems.   Family history of anesthetic problems  PROCEDURE  You will be given an anesthetic which will keep you pain free during surgery. This will be accomplished by a  general anesthetic (you go to sleep) or regional anesthesia (your arm is made numb). After the surgery you will be taken to the recovery area where a nurse will monitor your progress. When you are stable, taking fluids well and provided there are no complications, you will be allowed to return to your hospital room or possibly even go home. AFTER THE PROCEDURE   Only take over-the-counter or prescription medicines for pain, discomfort, or fever as directed by your caregiver.   You may use ice for 15 to 20 minutes, 3 to 4 times per day, for the first 2 to 3 days.   Change dressings and see your caregiver as directed.   Your caregiver will instruct you when to begin using and exercising your arm and elbow.  SEEK IMMEDIATE MEDICAL CARE IF:  There is redness, swelling, or increasing pain in the surgical area.   Pus, blood or unusual drainage is coming from the area or is visible on the dressings or cast.   An unexplained oral temperature above 102 F (38.9 C) develops.   You notice a foul smell coming from the surgical site or dressing.   The wound breaks open (edges are not staying together) after stitches have been removed.   You develop increasing pain or increasing pain with motion of your fingers.   There is numbness or tingling in your hand or forearm.   You have any other questions or concerns following surgery.  Document Released: 02/17/2001 Document Revised:   08/13/2011 Document Reviewed: 09/10/2008 [employer] Patient Information 2012 [employer]. 

## 2011-12-30 ENCOUNTER — Encounter (HOSPITAL_COMMUNITY): Payer: Self-pay | Admitting: Orthopedic Surgery

## 2011-12-30 NOTE — Progress Notes (Addendum)
Pt is s/p R recurrent olecranon fracture. Pt is s/p ORIF of R elbow. Pt neuro check is negative except pt has a past medical history of parkinson's and, per pt admission, pt has a history of some tremors in his L leg. Pt affect is flat but pt is alert and oriented x 3. Pt exhibits no problems swallowing. Pt has a dry ace cast splint to RUE. Pt keeps RUE elevated to prevent/minimize edema. Pt R fingers 2+ pitting edema. Pt is to be non weight bearing of RUE. Pt has + cms of R hand. Pt lungs CTA. Pt performs IS per order with some verbal reminding. Heart rate regular rate and rhythm. No s/sx cardiac or resp distress and no c/o such. Vital signs are stable. Pt is incontinent of bowel and bladder. Pt repts LBM 4/22. Pt denies passing gas, nausea or vomiting but tolerates diet fair to good. Abdomen is soft flat nontender and nondistended. BS +x4. Pt wears sling to RUE and ice prn to RUE. Pt moves R hand fingers freq per order. Plan is for pt to perform therapy and if pt passes PT/OT goals-d/c to home with wife per order.

## 2011-12-30 NOTE — Progress Notes (Signed)
CARE MANAGEMENT NOTE 12/30/2011  Patient:  Mark [name], Mark [name]   Account Number:  [id number]  Date Initiated:  12/30/2011  Documentation initiated by:  Vance Peper  Subjective/Objective Assessment:   66 yr old male s/p ORIF of left olecronon fracture     Action/Plan:   Spoke with patient and wife. Wife states they are going to do therapy as outpatient at Dr. Shelba Flake office. States this has already been arranged. CM signing off .   Anticipated DC Date:  12/31/2011   Anticipated DC Plan:  HOME/SELF CARE      DC Planning Services  CM consult      PAC Choice  NA   Choice offered to / List presented to:  NA           Status of service:  In process, will continue to follow  Medicare Important Message given?   (If response is "NO", the following Medicare IM given date fields will be blank) Date Medicare IM given:   Date Additional Medicare IM given:    Discharge Disposition:  HOME/SELF CARE

## 2011-12-30 NOTE — Progress Notes (Signed)
Occupational Therapy Evaluation Patient Details Name: Mark [name] MRN: [id number] DOB: [birth year] Today's Date: 12/30/2011 Time: 7425-9563 OT Time Calculation (min): 47 min  OT Assessment / Plan / Recommendation Clinical Impression  66 yo s/p recurrent fall onto RUE, which he fractured 8 wks ago and underwent ORIF. Pt fell going upstairs and refractured R elbow.Hardware removal and ORIF R olecranon fx. All OT education completed and precautions reviewed. Per MD, sling at all times and NWB RUE. Educated wife on availability and use of tub bench and elongated BSC. Pt to pruchase after D/C. Pt plans to follow up with outpt services. Rec outpt OT after D/C.     OT Assessment  All further OT needs can be met in the next venue of care    Follow Up Recommendations  Outpatient OT    Equipment Recommendations  Tub/shower bench;3 in 1 bedside comode;Other (comment) (wife to get equip)    Frequency      Precautions / Restrictions Precautions Precautions: Other (comment) (parkinson's ) Precaution Comments: poor balance. delayed initiation. Required Braces or Orthoses: Other Brace/Splint (sling R) Restrictions Weight Bearing Restrictions: Yes RUE Weight Bearing: Non weight bearing   Pertinent Vitals/Pain     ADL  Eating/Feeding: Simulated;Moderate assistance;Other (comment) (best with finger foods) Where Assessed - Eating/Feeding: Chair Grooming: Simulated;Moderate assistance Where Assessed - Grooming: Supported sitting Upper Body Bathing: Simulated;Maximal assistance Where Assessed - Upper Body Bathing: Sitting, chair Lower Body Bathing: Simulated;Maximal assistance Where Assessed - Lower Body Bathing: Sit to stand from chair;Supported Location manager Dressing: +1 Total assistance;Performed Where Assessed - Upper Body Dressing: Sitting, chair Lower Body Dressing: Performed;+1 Total assistance Where Assessed - Lower Body Dressing: Sit to stand from bed;Unsupported (post lean when  unsupported) Toilet Transfer: Other (comment) (incontinent of urine. Pt changed) Toileting - Hygiene: +1 Total assistance Tub/Shower Transfer: Other (comment) (demonstrated tub bench trasf technique to wife.) ADL Comments: wife/daughter provide level of care.    OT Goals Acute Rehab OT Goals OT Goal Formulation:  (eval only)  Visit Information  Last OT Received On: 12/30/11 PT/OT Co-Evaluation/Treatment: Yes    Subjective Data      Prior Functioning  Home Living Lives With: Spouse Available Help at Discharge: Family;Available 24 hours/day Type of Home: House Home Access: Stairs to enter [employer] of Steps: 4 Entrance Stairs-Rails: Can reach both Home Layout: Two level;Able to live on main level with bedroom/bathroom Bathroom Shower/Tub: Tub/shower unit;Curtain Bathroom Toilet: Standard Bathroom Accessibility: Yes How Accessible: Accessible via walker Home Adaptive Equipment: Walker - rolling;Bedside commode/3-in-1;Straight cane (lift chair) Prior Function Level of Independence: Needs assistance Needs Assistance: Bathing;Grooming;Dressing;Meal Prep;Light Housekeeping;Gait;Transfers (Assist with uneven surfaces) Bath: Minimal Dressing: Moderate Grooming: Moderate Meal Prep: Total Light Housekeeping: Total Gait Assistance: occasional A on uneven surfaces Transfer Assistance: min A Able to Take Stairs?: Yes Driving: No Communication Communication: Other (comment) (delayed responses) Dominant Hand: Right    Cognition  Overall Cognitive Status: History of cognitive impairments - at baseline Arousal/Alertness: Awake/alert Orientation Level: Appears intact for tasks assessed Behavior During Session: [facility] for tasks performed Cognition - Other Comments: impairment with judgement and reasoning    Extremity/Trunk Assessment Right Upper Extremity Assessment RUE ROM/Strength/Tone: Deficits;Due to pain;Due to precautions RUE Sensation: WFL - Light Touch;WFL -  Proprioception RUE Coordination: Deficits (due to positioning/splint) Left Upper Extremity Assessment LUE ROM/Strength/Tone: Within functional levels LUE Sensation: WFL - Light Touch;WFL - Proprioception LUE Coordination: Deficits LUE Coordination Deficits: parkisonian symptoms Trunk Assessment Trunk Assessment: Kyphotic   Mobility Bed Mobility Bed Mobility: Supine  to Sit Supine to Sit: 1: +2 Total assist;HOB elevated (pt does not sleep in bed at home. Sleeps in lift chair) Supine to Sit: Patient Percentage: 60% Transfers Transfers: Sit to Stand;Stand to Sit Sit to Stand: 1: +2 Total assist;With upper extremity assist;From bed;From chair/3-in-1 (+2 for safety. Able to perform with mod a of 1) Sit to Stand: Patient Percentage: 90% Stand to Sit: To chair/3-in-1;1: +2 Total assist;Other (comment) (poor control of descent.) Stand to Sit: Patient Percentage: 80%   Exercise    Balance    End of Session OT - End of Session Equipment Utilized During Treatment: Gait belt Activity Tolerance: Patient tolerated treatment well Patient left: in chair;with call bell/phone within reach;with family/visitor present Nurse Communication: Other (comment);Mobility status (D/C status)   [place]on Garden,HILLARY 12/30/2011, 1:07 PM [facility], OTR/L  [phone] 12/30/2011

## 2011-12-30 NOTE — Progress Notes (Signed)
Physical Therapy Evaluation Note  Past Medical History  Diagnosis Date  . Hypertension   . PAF (paroxysmal atrial fibrillation)   . Parkinson's disease   . Gout   . Coronary artery disease     SEVERE 3-VESSEL DX WITH NORMAL EF  . Fall   . Fracture of right olecranon process 10/23/2011  . Non-ST elevated myocardial infarction (non-STEMI)   . Stones in the urinary tract   . Seizures     12/10/11 HAD SEIZURE  . Skin cancer     left shoulder  . Arthritis   . Stroke 10/12    Hx of remote stroke, RT SIDED WEAKNESS    Past Surgical History  Procedure Date  . Coronary stent placement 2000  . Cardiac catheterization 06/26/2011    NORMAL LV SYSTOLIC FUNCTION. RCA IS LARGE AND DOMINANT  . Transthoracic echocardiogram 06/26/2011    EF 55-60%  . Orif elbow fracture 10/23/2011    Procedure: OPEN REDUCTION INTERNAL FIXATION (ORIF) ELBOW/OLECRANON FRACTURE;  Surgeon: Eulas Post, MD;  Location: [facility] OR;  Service: Orthopedics;  Laterality: Right;  . Orif elbow fracture 12/29/2011    Procedure: OPEN REDUCTION INTERNAL FIXATION (ORIF) ELBOW/OLECRANON FRACTURE;  Surgeon: Eulas Post, MD;  Location: [facility] OR;  Service: Orthopedics;  Laterality: Right;  . Hardware removal 12/29/2011    Procedure: HARDWARE REMOVAL;  Surgeon: Eulas Post, MD;  Location: [facility] OR;  Service: Orthopedics;  Laterality: Right;     12/30/11 1204  PT Visit Information  Last PT Received On 12/30/11  Assistance Needed +2 (for safety)  PT Time Calculation  PT Start Time 1204  PT Stop Time 1237  PT Time Calculation (min) 33 min  Subjective Data  Subjective Pt received supine in bed with sling on R UE and wife present  Precautions  Precautions Fall;Shoulder (parkinsons)  Required Braces or Orthoses (sling at all times for R UE)  Restrictions  RUE Weight Bearing NWB  Home Living  Lives With Spouse  Available Help at Discharge Family;Available 24 hours/day  Type of Home House  Home Access Stairs to enter    Entrance Stairs-Number of Steps 4  Entrance Stairs-Rails Can reach both  Home Layout Two level;Able to live on main level with bedroom/bathroom  Bathroom Shower/Tub Tub/shower unit;Curtain  Horticulturist, commercial Yes  How Accessible Accessible via walker  Home Adaptive Equipment Walker - rolling;Bedside commode/3-in-1;Straight cane (lift chair)  Prior Function  Level of Independence Needs assistance  Needs Assistance Bathing;Grooming;Dressing;Meal Prep;Light Housekeeping;Gait;Transfers (Assist with uneven surfaces)  Bath Minimal  Dressing Moderate  Grooming Moderate  Meal Prep Total  Light Housekeeping Total  Gait Assistance occasional A on uneven surfaces  Transfer Assistance min A  Able to Take Stairs? Yes  Driving No  Communication  Communication Other (comment) (delayed responses)  Cognition  Overall Cognitive Status History of cognitive impairments - at baseline  Arousal/Alertness Awake/alert  Orientation Level Appears intact for tasks assessed  Behavior During Session [facility] for tasks performed  Cognition - Other Comments impairment with judgement and reasoning  Right Upper Extremity Assessment  RUE ROM/Strength/Tone Deficits;Due to precautions  Left Upper Extremity Assessment  LUE ROM/Strength/Tone WFL for tasks assessed (decreased overall function due to parkinsons)  Right Lower Extremity Assessment  RLE ROM/Strength/Tone [facility] for tasks assessed  Left Lower Extremity Assessment  LLE ROM/Strength/Tone WFL for tasks assessed  Trunk Assessment  Trunk Assessment Kyphotic  Bed Mobility  Bed Mobility Supine to Sit  Supine to Sit 1: +2 Total assist;HOB  flat  Supine to Sit: Patient Percentage 60%  Details for Bed Mobility Assistance pt initiated but requierd assist at trunk and to scoot hips to EOB  Transfers  Sit to Stand 1: +2 Total assist;From bed;From chair/3-in-1;With upper extremity assist (safety, mod assist x1)  Sit to Stand: Patient  Percentage 90%  Stand to Sit To chair/3-in-1;3: Mod assist (to control descent)  Stand to Sit: Patient Percentage 80%  Details for Transfer Assistance due to parkinsons patient with difficulty controlling rate of mvmts  Ambulation/Gait  Ambulation/Gait Assistance 3: Mod assist  Ambulation Distance (Feet) 100 Feet  Assistive device (HHA via L UE)  Ambulation/Gait Assistance Details short, shuffled gait pattern  Gait velocity slow  Stairs Yes  Stairs Assistance 4: Min assist  Stairs Assistance Details (indicate cue type and reason) increased time required due to delayed processing  Stair Management Technique One rail Left;Forwards  Number of Stairs 2   Engineering geologist No  Balance  Balance Assessed Yes  Static Sitting Balance  Static Sitting - Balance Support No upper extremity supported;Feet supported  Static Sitting - Level of Assistance 4: Min assist  Static Sitting - Comment/# of Minutes max verbal cues to use abdominal muscles to bring self to neutral position. patient with + posterior lean but can correct with cues  Static Standing Balance  Static Standing - Balance Support Left upper extremity supported  Static Standing - Level of Assistance 4: Min assist  Static Standing - Comment/# of Minutes pt stood x 5 min to change depends  PT - End of Session  Equipment Utilized During Treatment Gait belt (R UE sling)  Activity Tolerance Patient limited by fatigue  Patient left in chair;with call bell/phone within reach;with family/visitor present  Nurse Communication Mobility status  PT Assessment  Clinical Impression Statement Patient s/p ORIF of R olecranon with h/o parkinsons. Patient now R UE NWB. Patient functioning near baseline per wife. Patient safe to return home with wife and daugher to provide 24/7 supervision/assist.   PT Recommendation/Assessment Patient needs continued PT services  PT Problem List Decreased strength;Decreased range of  motion;Decreased activity tolerance;Decreased balance;Decreased mobility  PT Therapy Diagnosis  Difficulty walking;Abnormality of gait;Generalized weakness  PT Plan  PT Frequency Min 3X/week  PT Treatment/Interventions Gait training;Stair training;Functional mobility training;Therapeutic activities;Therapeutic exercise;Balance training  PT Recommendation  Follow Up Recommendations Supervision/Assistance - 24 hour (out patient PT for R UE when approved by MD)  Equipment Recommended (see OT notes)  Individuals Consulted  Consulted and Agree with Results and Recommendations Family member/caregiver;Patient  Family Member Consulted spouse  Acute Rehab PT Goals  PT Goal Formulation With patient/family  Time For Goal Achievement 01/13/12  Potential to Achieve Goals Fair  Pt will go Supine/Side to Sit with min assist;with HOB 0 degrees  PT Goal: Supine/Side to Sit - Progress Goal set today  Pt will go Sit to Supine/Side with min assist;with HOB 0 degrees  PT Goal: Sit to Supine/Side - Progress Goal set today  Pt will Ambulate with supervision;with least restrictive assistive device;51 - 150 feet  PT Goal: Ambulate - Progress Goal set today  Pt will Go Up / Down Stairs with supervision;with rail(s)  PT Goal: Up/Down Stairs - Progress Goal set today  Written Expression  Dominant Hand Right    Pain: pt c/o R UE pain however unable to rate.  Lewis Shock, PT, DPT Pager #: [phone] Office #: [phone]

## 2011-12-30 NOTE — Progress Notes (Signed)
Utilization review completed. Cadynce Garrette, RN, BSN. 12/30/11 

## 2011-12-30 NOTE — Discharge Summary (Signed)
Physician Discharge Summary  Patient ID: Mark [name] MRN: [id number] DOB/AGE: [birth year] 66 y.o.  Admit date: 12/29/2011 Discharge date: 12/30/2011  Admission Diagnoses:  Olecranon fracture, recurrent  Discharge Diagnoses:  Principal Problem:  *Olecranon fracture, recurrent Active Problems:  Parkinson disease   Past Medical History  Diagnosis Date  . Hypertension   . PAF (paroxysmal atrial fibrillation)   . Parkinson's disease   . Gout   . Coronary artery disease     SEVERE 3-VESSEL DX WITH NORMAL EF  . Fall   . Fracture of right olecranon process 10/23/2011  . Non-ST elevated myocardial infarction (non-STEMI)   . Stones in the urinary tract   . Seizures     12/10/11 HAD SEIZURE  . Skin cancer     left shoulder  . Arthritis   . Stroke 10/12    Hx of remote stroke, RT SIDED WEAKNESS    Surgeries: Procedure(s): OPEN REDUCTION INTERNAL FIXATION (ORIF) ELBOW/OLECRANON FRACTURE HARDWARE REMOVAL on 12/29/2011   Consultants (if any):    Discharged Condition: Improved  Hospital Course: Mark [name] is an 66 y.o. male who was admitted 12/29/2011 with a diagnosis of Olecranon fracture and went to the operating room on 12/29/2011 and underwent the above named procedures.    He was given perioperative antibiotics:  Anti-infectives     Start     Dose/Rate Route Frequency Ordered Stop   12/29/11 1545   ceFAZolin (ANCEF) IVPB 1 g/50 mL premix        1 g 100 mL/hr over 30 Minutes Intravenous Every 6 hours 12/29/11 1530 12/30/11 0944   12/29/11 0800   ceFAZolin (ANCEF) IVPB 2 g/50 mL premix        2 g 100 mL/hr over 30 Minutes Intravenous 60 min pre-op 12/28/11 1409 12/29/11 1112        .  He was given sequential compression devices, early ambulation, and chemoprophylaxis for DVT prophylaxis.  He benefited maximally from the hospital stay and there were no complications.    Recent vital signs:  Filed Vitals:   12/30/11 1040  BP: 97/53  Pulse: 64  Temp:   Resp:      Recent laboratory studies:  Lab Results  Component Value Date   HGB 12.2* 12/28/2011   HGB 10.6* 10/24/2011   HGB 11.5* 10/23/2011   Lab Results  Component Value Date   WBC 6.9 12/28/2011   PLT 303 12/28/2011   No results found for this basename: INR   Lab Results  Component Value Date   NA 143 12/28/2011   K 3.5 12/28/2011   CL 105 12/28/2011   CO2 31 12/28/2011   BUN 16 12/28/2011   CREATININE 1.17 12/28/2011   GLUCOSE 89 12/28/2011    Discharge Medications:   Medication List  As of 12/30/2011  1:02 PM   TAKE these medications         amLODipine 5 MG tablet   Commonly known as: NORVASC   Take 2.5 mg by mouth daily.      aspirin EC 81 MG tablet   Take 81 mg by mouth daily.      BACTROBAN 2 %   Generic drug: mupirocin cream   Apply 1 application topically daily as needed.      carbidopa-levodopa 25-100 MG per tablet   Commonly known as: SINEMET IR   Take 1 tablet by mouth Three times a day.      carvedilol 3.125 MG tablet   Commonly known as: COREG  Take 3.125 mg by mouth 2 (two) times daily with a meal.      clobetasol 0.05 % external solution   Commonly known as: TEMOVATE   Apply 1 application topically every other day as needed. For scalp      clopidogrel 75 MG tablet   Commonly known as: PLAVIX   Take 75 mg by mouth Daily.      docusate sodium 100 MG capsule   Commonly known as: COLACE   Take 100 mg by mouth 2 (two) times daily.      fluticasone 0.05 % cream   Commonly known as: CUTIVATE   Apply 1 application topically 2 (two) times daily as needed. Apply to affected areas on face      HYDROcodone-acetaminophen 7.5-325 MG per tablet   Commonly known as: NORCO   Take 1 tablet by mouth Once daily as needed. FOR PAIN      levETIRAcetam 250 MG tablet   Commonly known as: KEPPRA   Take 250 mg by mouth Twice daily.      nitroGLYCERIN 0.4 MG SL tablet   Commonly known as: NITROSTAT   Place 0.4 mg under the tongue every 5 (five) minutes x 3 doses as  needed. For chest pain      rosuvastatin 20 MG tablet   Commonly known as: CRESTOR   Take 20 mg by mouth daily.      valsartan-hydrochlorothiazide 160-12.5 MG per tablet   Commonly known as: DIOVAN-HCT   Take 1 tablet by mouth daily.            Diagnostic Studies: Dg Chest 2 View  12/28/2011  *RADIOLOGY REPORT*  Clinical Data: Preoperative respiratory exam for elbow surgery. Hypertension and coronary artery disease.  CHEST - 2 VIEW  Comparison: 02/17/2011  Findings: The heart is at the upper limits normal in size.  The thoracic aorta is calcified and unfolded.  The pulmonary vascularity is normal.  Lungs are clear.  Ordinary degenerative changes effect the spine.  IMPRESSION: Atherosclerosis of the aorta.  No active process evident.  Original Report Authenticated By: Thomasenia Sales, M.D.   Dg Elbow Complete Right  12/19/2011  *RADIOLOGY REPORT*  Clinical Data: Fall.  Elbow pain and swelling.  RIGHT ELBOW - COMPLETE 3+ VIEW  Comparison: 10/13/2011  Findings: Posterior plate screw fixator of the olecranon noted with near anatomic alignment.  Prominent soft tissue swelling noted along the proximal forearm. There is soft tissue swelling dorsal to the olecranon, and anterior sail sign compatible with elbow joint effusion.  Forearm appears pronated and flexed on the frontal and oblique projections, resulting in reduced cortical definition of the proximal radius and ulna.  No distal humeral fracture is observed.  I do not observe a definite acute fracture of the distal radius or ulna.  IMPRESSION:  1.  Abnormal soft tissue swelling proximally in the forearm, especially dorsally. 2.  Posterior ulnar plate screw fixator noted with near anatomic alignment. 3.  Elbow joint effusion is present. 4.  Although I do not see a fracture of the proximal radius or ulna, due to pronation and flexion of the elbow during imaging there is reduced visualization of cortical margins which reduces diagnostic sensitivity and  specificity.  Elbow CT may be warranted if there is high index of suspicion of occult fracture.  Original Report Authenticated By: Dellia Cloud, M.D.   Dg Forearm Right  12/19/2011  *RADIOLOGY REPORT*  Clinical Data: Fall.  Elbow pain and swelling.  Reduced range of  motion.  RIGHT FOREARM - 2 VIEW  Comparison: 12/19/2011  Findings: Plate screw fixator of the proximal ulna noted.  Linear lucency along the olecranon cortex proximal to the fixation is observed, and could conceivably represent a new fracture.  Elbow effusion noted.  Soft tissue swelling is present in the proximal forearm.  IMPRESSION:  1.  Faint linear cortical lucency in the olecranon proximal to the fracture fixation hardware, query acute nondisplaced proximal olecranon fracture.  Consider CT scan of the elbow. 2.  Subcutaneous edema noted along the proximal forearm. 3.  Elbow joint effusion.  Original Report Authenticated By: Dellia Cloud, M.D.   Dg Tibia/fibula Right  12/19/2011  *RADIOLOGY REPORT*  Clinical Data: Lower leg pain.  Laceration.  Fall.  RIGHT TIBIA AND FIBULA - 2 VIEW  Comparison: 10/13/2011  Findings: Minimal spurring of the distal tibial rim noted.  A plantar calcaneal spur is present.  No fracture is observed.  No foreign body evident.  There is likely mild soft tissue swelling anterior to the tibia.  IMPRESSION:  1.  Mild soft tissue swelling anterior to the tibia. 2.  Small plantar calcaneal spur. 3.  Minimal anterior spurring and distal tibial rim.  Original Report Authenticated By: Dellia Cloud, M.D.   Ct Elbow Right W/o Cm  12/19/2011  *RADIOLOGY REPORT*  Clinical Data: Elbow pain status post fall today.  Recent elbow surgery.  CT OF THE RIGHT ELBOW WITHOUT CONTRAST  Technique:  Multidetector CT imaging was performed according to the standard protocol. Multiplanar CT image reconstructions were also generated.  Comparison: Radiographs same date and 10/13/2011.  Findings: The patient was unable to  raise her arm above her head or lie prone.  The study was performed with the patient supine with her arm by her side.  There is significantly degraded image quality with limited bone detail.  The olecranon plate and screws are intact without hardware loosening.  There does appear to be a nondisplaced fracture of the olecranon process proximal to the plate, best seen on axial images 27 and 28 and coronal image 22.  The original fracture of the olecranon process has healed.  The distal humerus and radial head are intact.  There is no dislocation.  Dorsal soft tissue swelling is noted.  There is a probable moderate sized hemarthrosis, suboptimally evaluated due to technique.  IMPRESSION:  1.  Nondisplaced fracture of the tip of the olecranon process, proximal to the fixation hardware. 2.  Elbow hemarthrosis. 3.  No hardware displacement or dislocation identified.  Please note limitations of this examination.  Original Report Authenticated By: Gerrianne Scale, M.D.   Dg Hand Complete Right  12/19/2011  *RADIOLOGY REPORT*  Clinical Data: Fall.  Pain in hand.  Laceration.  RIGHT HAND - COMPLETE 3+ VIEW  Comparison: None.  Findings: No fracture, foreign body, or acute bony findings are identified.  IMPRESSION:  No significant abnormality identified.  Original Report Authenticated By: Dellia Cloud, M.D.    Disposition: 01-Home or Self Care  Discharge Orders    Future Orders Please Complete By Expires   Diet general      Call MD / Call 911      Comments:   If you experience chest pain or shortness of breath, CALL 911 and be transported to the hospital emergency room.  If you develope a fever above 101 F, pus (white drainage) or increased drainage or redness at the wound, or calf pain, call your surgeon's office.   Discharge instructions  Comments:   Change dressing in 3 days and reapply fresh dressing, unless you have a splint (half cast).  If you have a splint/cast, just leave in place until  your follow-up appointment.    Keep wounds dry for 3 weeks.  Leave steri-strips in place on skin.  Do not apply lotion or anything to the wound.   Constipation Prevention      Comments:   Drink plenty of fluids.  Prune juice may be helpful.  You may use a stool softener, such as Colace (over the counter) 100 mg twice a day.  Use MiraLax (over the counter) for constipation as needed.   Weight Bearing as taught in Physical Therapy      Comments:   Use a walker or crutches as instructed.      Follow-up Information    Follow up with Reilly Blades P, MD in 2 weeks.   Contact information:   [facility] [address], [address] [place] [address] [phone]           Signed: Eulas Post 12/30/2011, 1:02 PM

## 2011-12-30 NOTE — Progress Notes (Signed)
Patient ID: Mark [name], male   DOB: [birth year], 66 y.o.   MRN: [id number]     Subjective:  Patient reports pain as mild to moderate.  Pt doing ok not great.  Objective:   VITALS:   Filed Vitals:   12/29/11 1714 12/29/11 2209 12/30/11 0209 12/30/11 0540  BP:  107/65 125/77 112/67  Pulse:  82 82 66  Temp:  97 F (36.1 C) 97 F (36.1 C) 98.7 F (37.1 C)  TempSrc:  Oral  Oral  Resp:  18 20 16   Height: 6' (1.829 m)     Weight: 102.059 kg (225 lb)     SpO2:  96% 99% 98%    Sensation intact distally Dorsiflexion/Plantar flexion intact Incision: dressing C/D/I and no drainage  LABS  No results found for this or any previous visit (from the past 24 hour(s)).  Dg Chest 2 View  12/28/2011  *RADIOLOGY REPORT*  Clinical Data: Preoperative respiratory exam for elbow surgery. Hypertension and coronary artery disease.  CHEST - 2 VIEW  Comparison: 02/17/2011  Findings: The heart is at the upper limits normal in size.  The thoracic aorta is calcified and unfolded.  The pulmonary vascularity is normal.  Lungs are clear.  Ordinary degenerative changes effect the spine.  IMPRESSION: Atherosclerosis of the aorta.  No active process evident.  Original Report Authenticated By: Thomasenia Sales, M.D.    Assessment/Plan: 1 Day Post-Op   Revision Open reduction internal fixation Right Olecronon Fracture   Advance diet Discharge home with home health   Mark [name] 12/30/2011, 9:01 AM   Teryl Lucy, MD [phone] pager

## 2012-02-15 ENCOUNTER — Other Ambulatory Visit: Payer: Self-pay | Admitting: Cardiology

## 2012-02-15 MED ORDER — CARVEDILOL 3.125 MG PO TABS: 3.1250 mg | ORAL_TABLET | Freq: Two times a day (BID) | ORAL | Status: DC

## 2012-03-04 ENCOUNTER — Emergency Department (HOSPITAL_COMMUNITY): Payer: Medicare HMO

## 2012-03-04 ENCOUNTER — Emergency Department (HOSPITAL_COMMUNITY)
Admission: EM | Admit: 2012-03-04 | Discharge: 2012-03-05 | Disposition: A | Discharge status: Home / Self Care | Payer: Medicare HMO | Attending: Emergency Medicine | Admitting: Emergency Medicine

## 2012-03-04 ENCOUNTER — Other Ambulatory Visit: Payer: Self-pay | Admitting: Cardiology

## 2012-03-04 ENCOUNTER — Encounter (HOSPITAL_COMMUNITY): Payer: Self-pay | Admitting: *Deleted

## 2012-03-04 DIAGNOSIS — I4891 Unspecified atrial fibrillation: Secondary | ICD-10-CM | POA: Insufficient documentation

## 2012-03-04 DIAGNOSIS — R569 Unspecified convulsions: Secondary | ICD-10-CM | POA: Insufficient documentation

## 2012-03-04 DIAGNOSIS — Z79899 Other long term (current) drug therapy: Secondary | ICD-10-CM | POA: Insufficient documentation

## 2012-03-04 DIAGNOSIS — G20A1 Parkinson's disease without dyskinesia, without mention of fluctuations: Secondary | ICD-10-CM | POA: Insufficient documentation

## 2012-03-04 DIAGNOSIS — I10 Essential (primary) hypertension: Secondary | ICD-10-CM | POA: Insufficient documentation

## 2012-03-04 DIAGNOSIS — I2510 Atherosclerotic heart disease of native coronary artery without angina pectoris: Secondary | ICD-10-CM | POA: Insufficient documentation

## 2012-03-04 DIAGNOSIS — Z8673 Personal history of transient ischemic attack (TIA), and cerebral infarction without residual deficits: Secondary | ICD-10-CM | POA: Insufficient documentation

## 2012-03-04 DIAGNOSIS — G20 Parkinson's disease: Secondary | ICD-10-CM | POA: Insufficient documentation

## 2012-03-04 LAB — CBC WITH DIFFERENTIAL/PLATELET
HCT: 34.6 % — ABNORMAL LOW (ref 39.0–52.0)
Hemoglobin: 11.8 g/dL — ABNORMAL LOW (ref 13.0–17.0)
Lymphocytes Relative: 18 % (ref 12–46)
Lymphs Abs: 1.3 10*3/uL (ref 0.7–4.0)
Monocytes Absolute: 0.6 10*3/uL (ref 0.1–1.0)
Monocytes Relative: 7 % (ref 3–12)
Neutro Abs: 5.3 10*3/uL (ref 1.7–7.7)
RBC: 3.93 MIL/uL — ABNORMAL LOW (ref 4.22–5.81)
WBC: 7.6 10*3/uL (ref 4.0–10.5)

## 2012-03-04 LAB — BASIC METABOLIC PANEL
BUN: 22 mg/dL (ref 6–23)
CO2: 26 mEq/L (ref 19–32)
Chloride: 103 mEq/L (ref 96–112)
Creatinine, Ser: 1.06 mg/dL (ref 0.50–1.35)
Potassium: 3.5 mEq/L (ref 3.5–5.1)

## 2012-03-04 MED ORDER — CLOPIDOGREL BISULFATE 75 MG PO TABS: 75.0000 mg | ORAL_TABLET | Freq: Every day | ORAL | Status: DC

## 2012-03-04 NOTE — ED Provider Notes (Signed)
History   This chart was scribed for Mark Gaskins, MD by Charolett Bumpers . The patient was seen in room APA18/APA18.    CSN: [id number]  Arrival date & time 03/04/12  2232   First MD Initiated Contact with Patient 03/04/12 2305      Chief Complaint  Patient presents with  . Seizures     HPI Mark [name] is a 66 y.o. male who presents to the Emergency Department via EMS complaining of an acute sudden onset, moderate, single episode seizure that occurred PTA. Wife states that the seizure lasted 5 minutes. Wife states that the seizure stopped on it's on. Patient reports a slight headache. Patient denies hitting head. Wife reports a h/o seizures. Wife states that the last seizure was about 3-4 months ago. Wife states that he takes Keppra for his seizures. Patient denies being on oxygen at home. Patient denies smoking. Patient denies any other injuries or complaints.   Past Medical History  Diagnosis Date  . Hypertension   . PAF (paroxysmal atrial fibrillation)   . Parkinson's disease   . Gout   . Coronary artery disease     SEVERE 3-VESSEL DX WITH NORMAL EF  . Fall   . Fracture of right olecranon process 10/23/2011  . Non-ST elevated myocardial infarction (non-STEMI)   . Stones in the urinary tract   . Seizures     12/10/11 HAD SEIZURE  . Skin cancer     left shoulder  . Arthritis   . Stroke 10/12    Hx of remote stroke, RT SIDED WEAKNESS    Past Surgical History  Procedure Date  . Coronary stent placement 2000  . Cardiac catheterization 06/26/2011    NORMAL LV SYSTOLIC FUNCTION. RCA IS LARGE AND DOMINANT  . Transthoracic echocardiogram 06/26/2011    EF 55-60%  . Orif elbow fracture 10/23/2011    Procedure: OPEN REDUCTION INTERNAL FIXATION (ORIF) ELBOW/OLECRANON FRACTURE;  Surgeon: Eulas Post, MD;  Location: [facility] OR;  Service: Orthopedics;  Laterality: Right;  . Orif elbow fracture 12/29/2011    Procedure: OPEN REDUCTION INTERNAL FIXATION (ORIF)  ELBOW/OLECRANON FRACTURE;  Surgeon: Eulas Post, MD;  Location: [facility] OR;  Service: Orthopedics;  Laterality: Right;  . Hardware removal 12/29/2011    Procedure: HARDWARE REMOVAL;  Surgeon: Eulas Post, MD;  Location: [facility] OR;  Service: Orthopedics;  Laterality: Right;  . Joint replacement     Family History  Problem Relation Age of Onset  . Heart disease    . Arthritis      History  Substance Use Topics  . Smoking status: Never Smoker   . Smokeless tobacco: Not on file  . Alcohol Use: No      Review of Systems  Constitutional: Negative for fever.  Eyes: Negative for visual disturbance.  Respiratory: Negative for cough and shortness of breath.   Cardiovascular: Negative for chest pain.  Gastrointestinal: Negative for vomiting and abdominal pain.  Skin: Negative for rash.  Neurological: Positive for headaches. Negative for weakness.  All other systems reviewed and are negative.    Allergies  Review of patient's allergies indicates no known allergies.  Home Medications   Current Outpatient Rx  Name Route Sig Dispense Refill  . AMLODIPINE BESYLATE 5 MG PO TABS Oral Take 2.5 mg by mouth daily.    . ASPIRIN EC 81 MG PO TBEC Oral Take 81 mg by mouth daily.      Marland Kitchen BACTROBAN 2 % EX CREA Topical Apply 1 application  topically daily as needed.     Marland Kitchen CARBIDOPA-LEVODOPA 25-100 MG PO TABS Oral Take 1 tablet by mouth Three times a day.    Marland Kitchen CARVEDILOL 3.125 MG PO TABS Oral Take 1 tablet (3.125 mg total) by mouth 2 (two) times daily with a meal. 60 tablet 6  . CLOBETASOL PROPIONATE 0.05 % EX SOLN Topical Apply 1 application topically every other day as needed. For scalp    . CLOPIDOGREL BISULFATE 75 MG PO TABS Oral Take 1 tablet (75 mg total) by mouth daily. 30 tablet 6    Send future refills to Norma Fredrickson at [facility] ...  . DOCUSATE SODIUM 100 MG PO CAPS Oral Take 100 mg by mouth 2 (two) times daily.    Marland Kitchen FLUTICASONE PROPIONATE 0.05 % EX CREA Topical Apply 1 application  topically 2 (two) times daily as needed. Apply to affected areas on face    . HYDROCODONE-ACETAMINOPHEN 7.5-325 MG PO TABS Oral Take 1 tablet by mouth Once daily as needed. FOR PAIN    . LEVETIRACETAM 250 MG PO TABS Oral Take 250 mg by mouth Twice daily.     Marland Kitchen NITROGLYCERIN 0.4 MG SL SUBL Sublingual Place 0.4 mg under the tongue every 5 (five) minutes x 3 doses as needed. For chest pain    . ROSUVASTATIN CALCIUM 20 MG PO TABS Oral Take 20 mg by mouth daily.      Marland Kitchen VALSARTAN-HYDROCHLOROTHIAZIDE 160-12.5 MG PO TABS Oral Take 1 tablet by mouth daily.       BP 155/83  Pulse 100  Temp 98.3 F (36.8 C) (Oral)  Resp 18  Ht 6' (1.829 m)  Wt 225 lb (102.059 kg)  BMI 30.52 kg/m2  SpO2 95%  Physical Exam CONSTITUTIONAL: Well developed/well nourished HEAD AND FACE: Normocephalic/atraumatic EYES: EOMI/PERRL ENMT: Mucous membranes moist NECK: supple no meningeal signs CV: S1/S2 noted, no murmurs/rubs/gallops noted LUNGS: Lungs are clear to auscultation bilaterally, no apparent distress ABDOMEN: soft, nontender, no rebound or guarding NEURO: Pt is awake/alert, moves all extremitiesx4, answers all questions appropriately.  He some residual weakness in his right UE EXTREMITIES: pulses normal, full ROM. Splint on RUE. SKIN: warm, color normal PSYCH: no abnormalities of mood noted  ED Course  Procedures  DIAGNOSTIC STUDIES: Oxygen Saturation is 95% on 2L [place], adequate by my interpretation.    COORDINATION OF CARE:  2310: Discussed planned course of treatment with the patient, who is agreeable at this time.  Pt at baseline, no further sz activity per wife only had one tonight and resolved on its own.  He is taking keppra and has f/u this month with neuro.  I performed CXR to r/o any obvious aspiration (had episodes of pulse in low 90s) but this resolved  Stable for d/c     MDM  Nursing notes including past medical history and social history reviewed and considered in documentation All  labs/vitals reviewed and considered xrays reviewed and considered   I personally performed the services described in this documentation, which was scribed in my presence. The recorded information has been reviewed and considered.          Mark Gaskins, MD 03/05/12 [phone]

## 2012-03-04 NOTE — ED Notes (Signed)
Pt to department via EMS.  Per report, pt's wife called due to pt having a seizure.  Stated seizure lasted "a couple minutes".  Pt does have a history of seizures, reported last one about 6 months ago.  Pt is alert at present.  Oriented to person and location.  Denies pain at present time.  No medications given by EMS.

## 2012-03-05 NOTE — Discharge Instructions (Signed)
Please be aware you may have another seizure ° °Do not drive until seen by your physician for your condition ° °Do not climb ladders/roofs/trees as a seizure can occur at that height and cause serious harm ° °Do not bathe/swim alone as a seizure can occur and cause serious harm ° °Please followup with your physician or neurologist for further testing and possible treatment ° ° °

## 2012-03-30 ENCOUNTER — Ambulatory Visit: Payer: Medicare HMO | Admitting: Cardiovascular Disease

## 2012-03-31 ENCOUNTER — Telehealth: Payer: Self-pay | Admitting: *Deleted

## 2012-03-31 NOTE — Telephone Encounter (Signed)
noted 

## 2012-03-31 NOTE — Telephone Encounter (Signed)
Left message for patient to reschedule Mark [name] appointment today 03/31/12 @ 2 pm. Dr. Elease Hashimoto was not scheduled to be in the office on 03/31/12. I rescheduled the patient appointment 04/05/12 @ 11 am. I tired to reach Mark [name] several times on 03/30/12 left voice mail.

## 2012-04-05 ENCOUNTER — Ambulatory Visit (INDEPENDENT_AMBULATORY_CARE_PROVIDER_SITE_OTHER): Payer: Medicare HMO | Admitting: Cardiovascular Disease

## 2012-04-05 ENCOUNTER — Encounter: Payer: Self-pay | Admitting: Cardiovascular Disease

## 2012-04-05 ENCOUNTER — Encounter: Payer: Self-pay | Admitting: *Deleted

## 2012-04-05 VITALS — BP 135/85 | HR 61 | Ht 72.0 in | Wt 232.0 lb

## 2012-04-05 DIAGNOSIS — S52021A Displaced fracture of olecranon process without intraarticular extension of right ulna, initial encounter for closed fracture: Secondary | ICD-10-CM

## 2012-04-05 DIAGNOSIS — I2510 Atherosclerotic heart disease of native coronary artery without angina pectoris: Secondary | ICD-10-CM

## 2012-04-05 DIAGNOSIS — S52023A Displaced fracture of olecranon process without intraarticular extension of unspecified ulna, initial encounter for closed fracture: Secondary | ICD-10-CM

## 2012-04-05 MED ORDER — CARVEDILOL 3.125 MG PO TABS: 3.1250 mg | ORAL_TABLET | Freq: Two times a day (BID) | ORAL | Status: DC

## 2012-04-05 MED ORDER — AMLODIPINE BESYLATE 5 MG PO TABS: 2.5000 mg | ORAL_TABLET | Freq: Every day | ORAL | Status: DC

## 2012-04-05 MED ORDER — ROSUVASTATIN CALCIUM 20 MG PO TABS: 20.0000 mg | ORAL_TABLET | Freq: Every day | ORAL | Status: DC

## 2012-04-05 NOTE — Assessment & Plan Note (Signed)
Mark [name] has severe three-vessel coronary artery disease that is not amenable to bypass surgery.  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 It is very small vessel and fills via collaterals. He has a tight stenosis in the terminal circumflex artery and moderate stenosis in the proximal right coronary artery.   He has normal left ventricle systolic function.  He has had 2 elbow surgeries since his heart catheterization and did quite well with those. He did not have any competitions. He now needs to have a right submandibular lymph node resected.  He's done fairly well on medical therapy. I do think he would be at moderate to high risk for having coronary vascular complications but I do think that he is asked to do this we can get him at this point. I do agree that the submandibular lymph node needs to be resected as it seems to be growing fairly quickly.    I recommended that he have the surgery at one hospital or perhaps [facility] so that we can be available in case we're needed. I'll see him again in 6 months for a followup office visit and fasting labs.

## 2012-04-05 NOTE — Patient Instructions (Addendum)
Your physician wants you to follow-up in: 6 months  You will receive a reminder letter in the mail two months in advance. If you don't receive a letter, please call our office to schedule the follow-up appointment.  Dr Ezzard Standing is his Careers adviser.

## 2012-04-05 NOTE — Progress Notes (Signed)
Mark [name] Date of Birth  April 25, 1946       [facility] Office [address], [address]  [address] [place], [place]  [address]   [place], [place]  [address] [phone]     [phone]   Fax  [phone]    Fax [phone]  Problem List: 1.  coronary artery disease-he's had severe three-vessel coronary disease. His left anterior descending artery is occluded after giving off a large first diagonal artery. The distal/terminal circumflex artery has a subtotal stenosis. The right coronary artery has a 50-60% proximal stenosis. 2. Parkinson's disease 3. Hypertension 4. Hyperlipidemia   History of Present Illness:  Mark [name] is a 66 year old gentleman with a history of coronary artery disease patent. We performed a cardiac catheterization in October, 2012 which revealed severe three-vessel coronary artery disease. He has an occluded mid left anterior descending artery which fills slowly via collaterals. He has a tight stenosis in the terminal circumflex artery and a moderate stenosis in the right coronary artery.  We've been treating him medically and he said went very well. He's not had any episodes of chest pain or shortness breath. He has been having some nose bleeds presumably due to Plavix.  He's developed a swollen mass on his right mandibular area. Needle biopsy revealed no evidence of cancer. He needs to have that mass removed and is here for preoperative visit.    He's had some problems with falling recently. He's had significant orthopedic injuries during these falls and has had surgery to repair a right elbow fracture on 2 different occasions. He did not have any cardiac palpitations during that orthopedic surgery. He has no episodes of chest pain or shortness breath when climbing stairs although he is a little unsteady on his feet due to his Parkinson's disease.  Current Outpatient Prescriptions on File Prior to Visit  Medication Sig  Dispense Refill  . amLODipine (NORVASC) 5 MG tablet Take 2.5 mg by mouth daily.      Marland Kitchen aspirin EC 81 MG tablet Take 81 mg by mouth daily.        Marland Kitchen BACTROBAN 2 % Apply 1 application topically daily as needed.       . carbidopa-levodopa (SINEMET) 25-100 MG per tablet Take 1 tablet by mouth Three times a day.      . carvedilol (COREG) 3.125 MG tablet Take 1 tablet (3.125 mg total) by mouth 2 (two) times daily with a meal.  60 tablet  6  . clobetasol (TEMOVATE) 0.05 % external solution Apply 1 application topically every other day as needed. For scalp      . clopidogrel (PLAVIX) 75 MG tablet Take 1 tablet (75 mg total) by mouth daily.  30 tablet  6  . docusate sodium (COLACE) 100 MG capsule Take 100 mg by mouth 2 (two) times daily.      . fluticasone (CUTIVATE) 0.05 % cream Apply 1 application topically 2 (two) times daily as needed. Apply to affected areas on face      . HYDROcodone-acetaminophen (NORCO) 7.5-325 MG per tablet Take 1 tablet by mouth Once daily as needed. FOR PAIN      . levETIRAcetam (KEPPRA) 250 MG tablet Take 250 mg by mouth Twice daily.       . nitroGLYCERIN (NITROSTAT) 0.4 MG SL tablet Place 0.4 mg under the tongue every 5 (five) minutes x 3 doses as needed. For chest pain      . rosuvastatin (  CRESTOR) 20 MG tablet Take 20 mg by mouth daily.        . valsartan-hydrochlorothiazide (DIOVAN-HCT) 160-12.5 MG per tablet Take 1 tablet by mouth daily.         No Known Allergies  Past Medical History  Diagnosis Date  . Hypertension   . PAF (paroxysmal atrial fibrillation)   . Parkinson's disease   . Gout   . Coronary artery disease     SEVERE 3-VESSEL DX WITH NORMAL EF  . Fall   . Fracture of right olecranon process 10/23/2011  . Non-ST elevated myocardial infarction (non-STEMI)   . Stones in the urinary tract   . Seizures     12/10/11 HAD SEIZURE  . Skin cancer     left shoulder  . Arthritis   . Stroke 10/12    Hx of remote stroke, RT SIDED WEAKNESS    Past Surgical  History  Procedure Date  . Coronary stent placement 2000  . Cardiac catheterization 06/26/2011    NORMAL LV SYSTOLIC FUNCTION. RCA IS LARGE AND DOMINANT  . Transthoracic echocardiogram 06/26/2011    EF 55-60%  . Orif elbow fracture 10/23/2011    Procedure: OPEN REDUCTION INTERNAL FIXATION (ORIF) ELBOW/OLECRANON FRACTURE;  Surgeon: Eulas Post, MD;  Location: [facility] OR;  Service: Orthopedics;  Laterality: Right;  . Orif elbow fracture 12/29/2011    Procedure: OPEN REDUCTION INTERNAL FIXATION (ORIF) ELBOW/OLECRANON FRACTURE;  Surgeon: Eulas Post, MD;  Location: [facility] OR;  Service: Orthopedics;  Laterality: Right;  . Hardware removal 12/29/2011    Procedure: HARDWARE REMOVAL;  Surgeon: Eulas Post, MD;  Location: [facility] OR;  Service: Orthopedics;  Laterality: Right;  . Joint replacement     History  Smoking status  . Never Smoker   Smokeless tobacco  . Not on file    History  Alcohol Use No    Family History  Problem Relation Age of Onset  . Heart disease    . Arthritis      Reviw of Systems:  Reviewed in the HPI.  All other systems are negative.  Physical Exam: Blood pressure 135/85, pulse 61, height 6' (1.829 m), weight 232 lb (105.235 kg). General: Well developed, well nourished, in no acute distress.  Head: Normocephalic, atraumatic, sclera non-icteric, mucus membranes are moist,   Neck: Supple. Carotids are 2 + without bruits. No JVD  Lungs: Clear bilaterally to auscultation.  Heart: regular rate.  normal  S1 S2. No murmurs, gallops or rubs.  Abdomen: Soft, non-tender, non-distended with normal bowel sounds. No hepatomegaly. No rebound/guarding. No masses.  Msk:  Strength and tone are normal  Extremities: No clubbing or cyanosis. No edema.  Distal pedal pulses are 2+ and equal bilaterally.  Neuro: Alert and oriented X 3. Moves all extremities spontaneously.  Psych:  Responds to questions appropriately with a normal affect.  ECG: 04/05/2012-sinus bradycardia  at 59 beats a minute minute. He has no ST or T wave changes.  Assessment / Plan:

## 2012-04-28 ENCOUNTER — Encounter (HOSPITAL_COMMUNITY): Payer: Self-pay | Admitting: Pharmacy Technician

## 2012-04-28 NOTE — Pre-Procedure Instructions (Addendum)
[address]  04/28/2012   Your procedure is scheduled on:  Thurs, Sept 5 @ 10:15 AM  Report to [facility] at 8:15 AM.  Call this number if you have problems the morning of surgery: [phone]   Remember:   Do not eat food or drink:After Midnight.    Take these medicines the morning of surgery with A SIP OF WATER: Amlodipine(Norvasc),Carbidopa-Levodopa(Sinemet),Carvedilol(Coreg),Keppra(Levetiracetam),and Pain Pill(if needed)  STOP  plavix 05/07/12   Do not wear jewelry  Do not wear lotions, powders, or colognes  Men may shave face and neck.  Do not bring valuables to the hospital.  Contacts, dentures or bridgework may not be worn into surgery.  Leave suitcase in the car. After surgery it may be brought to your room.  For patients admitted to the hospital, checkout time is 11:00 AM the day of discharge.   Patients discharged the day of surgery will not be allowed to drive home. Maralyn Sago [phone]  [phone]  Special Instructions: CHG Shower Use Special Wash: 1/2 bottle night before surgery and 1/2 bottle morning of surgery.   Please read over the following fact sheets that you were given: Pain Booklet, Coughing and Deep Breathing, MRSA Information and Surgical Site Infection Prevention

## 2012-04-29 ENCOUNTER — Encounter (HOSPITAL_COMMUNITY)
Admission: RE | Admit: 2012-04-29 | Discharge: 2012-04-29 | Disposition: A | Discharge status: Home / Self Care | Payer: Medicare HMO | Source: Ambulatory Visit | Attending: Otolaryngology | Admitting: Otolaryngology

## 2012-04-29 ENCOUNTER — Encounter (HOSPITAL_COMMUNITY): Payer: Self-pay

## 2012-04-29 LAB — CBC
HCT: 38.4 % — ABNORMAL LOW (ref 39.0–52.0)
MCHC: 35.4 g/dL (ref 30.0–36.0)
Platelets: 232 10*3/uL (ref 150–400)
RDW: 13.5 % (ref 11.5–15.5)
WBC: 7.9 10*3/uL (ref 4.0–10.5)

## 2012-04-29 LAB — SURGICAL PCR SCREEN
MRSA, PCR: NEGATIVE
Staphylococcus aureus: NEGATIVE

## 2012-04-29 LAB — BASIC METABOLIC PANEL
BUN: 20 mg/dL (ref 6–23)
Calcium: 9.3 mg/dL (ref 8.4–10.5)
Creatinine, Ser: 1.23 mg/dL (ref 0.50–1.35)
GFR calc Af Amer: 69 mL/min — ABNORMAL LOW (ref >90)
GFR calc non Af Amer: 60 mL/min — ABNORMAL LOW (ref >90)

## 2012-04-29 NOTE — Progress Notes (Addendum)
Note from dr Elease Hashimoto 7/13, ekg,7. 13, cxr 6.13 in epic,echo 12,cath 12 No orders at preadmit. Office called by [employer]

## 2012-05-08 DIAGNOSIS — K118 Other diseases of salivary glands: Secondary | ICD-10-CM

## 2012-05-08 HISTORY — DX: Other diseases of salivary glands: K11.8

## 2012-05-11 NOTE — Progress Notes (Addendum)
Notified PATIENT OF TIME CHANGE, PATIENT TO ARRIVE AT 530 AM 05/12/12.

## 2012-05-11 NOTE — H&P (Signed)
PREOPERATIVE H&P  Chief Complaint: Right Parotid Mass   HPI: Mark [name] is a 66 y.o. male who presents for evaluation of right parotid mass that has slowly been enlarging over the last 2 years. No associated pain or nerve dysfunction. 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 The mass has enlarged slightly to 4-4.5 cm and he has received clearance from his cardiologist although he is still moderate to high risk for surgery.   Past Medical History  Diagnosis Date  . Hypertension   . PAF (paroxysmal atrial fibrillation)   . Parkinson's disease   . Gout   . Coronary artery disease     SEVERE 3-VESSEL DX WITH NORMAL EF  . Fall   . Fracture of right olecranon process 10/23/2011  . Non-ST elevated myocardial infarction (non-STEMI)   . Stones in the urinary tract   . Seizures     12/10/11 HAD SEIZURE  . Skin cancer     left shoulder  . Arthritis   . Stroke 10/12    Hx of remote stroke, RT SIDED WEAKNESS   Past Surgical History  Procedure Date  . Coronary stent placement 2000  . Cardiac catheterization 06/26/2011    NORMAL LV SYSTOLIC FUNCTION. RCA IS LARGE AND DOMINANT  . Transthoracic echocardiogram 06/26/2011    EF 55-60%  . Orif elbow fracture 12/29/2011    Procedure: OPEN REDUCTION INTERNAL FIXATION (ORIF) ELBOW/OLECRANON FRACTURE;  Surgeon: Eulas Post, MD;  Location: [facility] OR;  Service: Orthopedics;  Laterality: Right;  . Orif elbow fracture 12/29/2011    Procedure: OPEN REDUCTION INTERNAL FIXATION (ORIF) ELBOW/OLECRANON FRACTURE;  Surgeon: Eulas Post, MD;  Location: [facility] OR;  Service: Orthopedics;  Laterality: Right;  . Hardware removal 12/29/2011    Procedure: HARDWARE REMOVAL;  Surgeon: Eulas Post, MD;  Location: [facility] OR;  Service: Orthopedics;  Laterality: Right;   History   Social History  . Marital Status:  Married    Spouse Name: N/A    Number of Children: N/A  . Years of Education: N/A   Social History Main Topics  . Smoking status: Never Smoker   . Smokeless tobacco: Not on file  . Alcohol Use: No  . Drug Use: No  . Sexually Active: No   Other Topics Concern  . Not on file   Social History Narrative  . No narrative on file   Family History  Problem Relation Age of Onset  . Heart disease    . Arthritis     No Known Allergies Prior to Admission medications   Medication Sig Start Date End Date Taking? Authorizing Provider  amLODipine (NORVASC) 5 MG tablet Take 0.5 tablets (2.5 mg total) by mouth daily. 04/05/12  Yes Vesta Mixer, MD  aspirin EC 81 MG tablet Take 81 mg by mouth daily.     Yes Historical Provider, MD  BACTROBAN 2 % Apply 1 application topically daily as needed.  07/23/11  Yes Historical Provider, MD  carbidopa-levodopa (SINEMET) 25-100 MG per tablet Take 1 tablet by mouth Three times a day. 09/23/11  Yes Historical Provider, MD  carvedilol (COREG) 3.125 MG tablet Take 1 tablet (3.125 mg total) by mouth 2 (two) times daily with a meal. 04/05/12  Yes Vesta Mixer, MD  clobetasol (TEMOVATE) 0.05 % external solution Apply 1  application topically every other day as needed. For scalp   Yes Historical Provider, MD  clopidogrel (PLAVIX) 75 MG tablet Take 1 tablet (75 mg total) by mouth daily. 03/04/12  Yes Prescott Parma, PA  docusate sodium (COLACE) 100 MG capsule Take 100 mg by mouth 2 (two) times daily.   Yes Historical Provider, MD  fluticasone (CUTIVATE) 0.05 % cream Apply 1 application topically 2 (two) times daily as needed. Apply to affected areas on face   Yes Historical Provider, MD  HYDROcodone-acetaminophen (NORCO) 7.5-325 MG per tablet Take 1 tablet by mouth Once daily as needed. FOR PAIN 10/14/11  Yes Historical Provider, MD  levETIRAcetam (KEPPRA) 250 MG tablet Take 250 mg by mouth Twice daily.  09/23/11  Yes Historical Provider, MD  nitroGLYCERIN (NITROSTAT) 0.4  MG SL tablet Place 0.4 mg under the tongue every 5 (five) minutes x 3 doses as needed. For chest pain   Yes Historical Provider, MD  rosuvastatin (CRESTOR) 20 MG tablet Take 1 tablet (20 mg total) by mouth daily. 04/05/12  Yes Vesta Mixer, MD  valsartan-hydrochlorothiazide (DIOVAN-HCT) 160-12.5 MG per tablet Take 1 tablet by mouth daily.    Yes Historical Provider, MD     Positive ROS: R neck mass  All other systems have been reviewed and were otherwise negative with the exception of those mentioned in the HPI and as above.  Physical Exam: There were no vitals filed for this visit.  General: Alert, no acute distress Oral: Normal oral mucosa and tonsils Nasal: Clear nasal passages Neck: No palpable adenopathy or thyroid nodules. 4 cm right parotid mass. Normal facial nerve function Ear: Ear canal is clear with normal appearing TMs Cardiovascular: Regular rate and rhythm, no murmur.  Respiratory: Clear to auscultation Neurologic: Alert and oriented x 3   Assessment/Plan: RIGHT PAROTID MASS RIGHT Plan for Procedure(s): PAROTIDECTOMY RIGHT   Dillard Cannon, MD 05/11/2012 1:18 PM

## 2012-05-12 ENCOUNTER — Encounter (HOSPITAL_COMMUNITY): Payer: Self-pay | Admitting: Certified Registered"

## 2012-05-12 ENCOUNTER — Encounter (HOSPITAL_COMMUNITY): Payer: Self-pay | Admitting: *Deleted

## 2012-05-12 ENCOUNTER — Ambulatory Visit (HOSPITAL_COMMUNITY): Payer: Medicare HMO | Admitting: Certified Registered"

## 2012-05-12 ENCOUNTER — Encounter (HOSPITAL_COMMUNITY)
Admission: RE | Disposition: A | Discharge status: Home / Self Care | Payer: Self-pay | Source: Ambulatory Visit | Attending: Otolaryngology

## 2012-05-12 ENCOUNTER — Ambulatory Visit (HOSPITAL_COMMUNITY)
Admission: RE | Admit: 2012-05-12 | Discharge: 2012-05-13 | Disposition: A | Discharge status: Home / Self Care | Payer: Medicare HMO | Source: Ambulatory Visit | Attending: Otolaryngology | Admitting: Otolaryngology

## 2012-05-12 DIAGNOSIS — G20A1 Parkinson's disease without dyskinesia, without mention of fluctuations: Secondary | ICD-10-CM | POA: Insufficient documentation

## 2012-05-12 DIAGNOSIS — D119 Benign neoplasm of major salivary gland, unspecified: Principal | ICD-10-CM | POA: Insufficient documentation

## 2012-05-12 DIAGNOSIS — I2510 Atherosclerotic heart disease of native coronary artery without angina pectoris: Secondary | ICD-10-CM

## 2012-05-12 DIAGNOSIS — G20 Parkinson's disease: Secondary | ICD-10-CM | POA: Insufficient documentation

## 2012-05-12 DIAGNOSIS — Z8673 Personal history of transient ischemic attack (TIA), and cerebral infarction without residual deficits: Secondary | ICD-10-CM | POA: Insufficient documentation

## 2012-05-12 DIAGNOSIS — Z01812 Encounter for preprocedural laboratory examination: Secondary | ICD-10-CM | POA: Insufficient documentation

## 2012-05-12 DIAGNOSIS — I4891 Unspecified atrial fibrillation: Secondary | ICD-10-CM | POA: Insufficient documentation

## 2012-05-12 DIAGNOSIS — I10 Essential (primary) hypertension: Secondary | ICD-10-CM | POA: Insufficient documentation

## 2012-05-12 HISTORY — PX: PAROTIDECTOMY: SUR1003

## 2012-05-12 HISTORY — DX: Sleep apnea, unspecified: G47.30

## 2012-05-12 HISTORY — DX: Acute myocardial infarction, unspecified: I21.9

## 2012-05-12 HISTORY — PX: PAROTIDECTOMY: SHX2163

## 2012-05-12 SURGERY — EXCISION, PAROTID GLAND
Anesthesia: General | Site: Neck | Laterality: Right | Wound class: Clean

## 2012-05-12 MED ORDER — EPHEDRINE SULFATE 50 MG/ML IJ SOLN
INTRAMUSCULAR | Status: DC | PRN
  Administered 2012-05-12 (×2): 10 mg via INTRAVENOUS
  Administered 2012-05-12: 5 mg via INTRAVENOUS
  Administered 2012-05-12: 10 mg via INTRAVENOUS

## 2012-05-12 MED ORDER — CLOPIDOGREL BISULFATE 75 MG PO TABS: 75.0000 mg | ORAL_TABLET | Freq: Every day | ORAL | Status: DC

## 2012-05-12 MED ORDER — MUPIROCIN CALCIUM 2 % EX CREA
1.0000 "application " | TOPICAL_CREAM | Freq: Every day | CUTANEOUS | Status: DC
  Filled 2012-05-12: qty 15

## 2012-05-12 MED ORDER — NITROGLYCERIN 0.4 MG SL SUBL: 0.4000 mg | SUBLINGUAL_TABLET | SUBLINGUAL | Status: DC | PRN

## 2012-05-12 MED ORDER — HYDROMORPHONE HCL PF 1 MG/ML IJ SOLN: 0.2500 mg | INTRAMUSCULAR | Status: DC | PRN

## 2012-05-12 MED ORDER — BACITRACIN ZINC 500 UNIT/GM EX OINT: 1.0000 "application " | TOPICAL_OINTMENT | Freq: Two times a day (BID) | CUTANEOUS | Status: AC

## 2012-05-12 MED ORDER — HYDROCHLOROTHIAZIDE 12.5 MG PO CAPS
12.5000 mg | ORAL_CAPSULE | Freq: Every day | ORAL | Status: DC
  Filled 2012-05-12: qty 1

## 2012-05-12 MED ORDER — MIDAZOLAM HCL 2 MG/2ML IJ SOLN: 1.0000 mg | INTRAMUSCULAR | Status: DC | PRN

## 2012-05-12 MED ORDER — KCL IN DEXTROSE-NACL 20-5-0.45 MEQ/L-%-% IV SOLN
INTRAVENOUS | Status: DC
  Administered 2012-05-12 – 2012-05-13 (×2): via INTRAVENOUS
  Filled 2012-05-12 (×3): qty 1000

## 2012-05-12 MED ORDER — ACETAMINOPHEN 650 MG RE SUPP: 650.0000 mg | RECTAL | Status: DC | PRN

## 2012-05-12 MED ORDER — CEFAZOLIN SODIUM-DEXTROSE 2-3 GM-% IV SOLR
INTRAVENOUS | Status: DC | PRN
  Administered 2012-05-12: 2 g via INTRAVENOUS

## 2012-05-12 MED ORDER — CLOBETASOL PROPIONATE 0.05 % EX SOLN: 1.0000 "application " | CUTANEOUS | Status: DC | PRN

## 2012-05-12 MED ORDER — FENTANYL CITRATE 0.05 MG/ML IJ SOLN: 50.0000 ug | INTRAMUSCULAR | Status: DC | PRN

## 2012-05-12 MED ORDER — PROPOFOL 10 MG/ML IV BOLUS
INTRAVENOUS | Status: DC | PRN
  Administered 2012-05-12: 140 mg via INTRAVENOUS

## 2012-05-12 MED ORDER — SUCCINYLCHOLINE CHLORIDE 20 MG/ML IJ SOLN
INTRAMUSCULAR | Status: DC | PRN
  Administered 2012-05-12: 120 mg via INTRAVENOUS

## 2012-05-12 MED ORDER — SUFENTANIL CITRATE 50 MCG/ML IV SOLN
INTRAVENOUS | Status: DC | PRN
  Administered 2012-05-12 (×2): 10 ug via INTRAVENOUS

## 2012-05-12 MED ORDER — LIDOCAINE-EPINEPHRINE 1 %-1:100000 IJ SOLN
INTRAMUSCULAR | Status: DC | PRN
  Administered 2012-05-12: 6 mL

## 2012-05-12 MED ORDER — PROMETHAZINE HCL 25 MG/ML IJ SOLN: 6.2500 mg | INTRAMUSCULAR | Status: DC | PRN

## 2012-05-12 MED ORDER — MORPHINE SULFATE 2 MG/ML IJ SOLN
2.0000 mg | INTRAMUSCULAR | Status: DC | PRN
  Administered 2012-05-13: 2 mg via INTRAVENOUS
  Filled 2012-05-12: qty 1

## 2012-05-12 MED ORDER — ACETAMINOPHEN 160 MG/5ML PO SOLN: 650.0000 mg | ORAL | Status: DC | PRN

## 2012-05-12 MED ORDER — CEFAZOLIN SODIUM 1-5 GM-% IV SOLN
1.0000 g | Freq: Three times a day (TID) | INTRAVENOUS | Status: AC
  Administered 2012-05-12 – 2012-05-13 (×3): 1 g via INTRAVENOUS
  Filled 2012-05-12 (×3): qty 50

## 2012-05-12 MED ORDER — ONDANSETRON HCL 4 MG/2ML IJ SOLN
INTRAMUSCULAR | Status: DC | PRN
  Administered 2012-05-12: 4 mg via INTRAVENOUS

## 2012-05-12 MED ORDER — BACITRACIN ZINC 500 UNIT/GM EX OINT
TOPICAL_OINTMENT | CUTANEOUS | Status: AC
  Filled 2012-05-12: qty 15

## 2012-05-12 MED ORDER — DEXAMETHASONE SODIUM PHOSPHATE 4 MG/ML IJ SOLN
INTRAMUSCULAR | Status: DC | PRN
  Administered 2012-05-12: 4 mg via INTRAVENOUS

## 2012-05-12 MED ORDER — VALSARTAN-HYDROCHLOROTHIAZIDE 160-12.5 MG PO TABS: 1.0000 | ORAL_TABLET | Freq: Every day | ORAL | Status: DC

## 2012-05-12 MED ORDER — 0.9 % SODIUM CHLORIDE (POUR BTL) OPTIME
TOPICAL | Status: DC | PRN
  Administered 2012-05-12: 1000 mL

## 2012-05-12 MED ORDER — FLUTICASONE PROPIONATE 0.05 % EX CREA: 1.0000 "application " | TOPICAL_CREAM | Freq: Two times a day (BID) | CUTANEOUS | Status: DC | PRN

## 2012-05-12 MED ORDER — LACTATED RINGERS IV SOLN
INTRAVENOUS | Status: DC | PRN
  Administered 2012-05-12 (×2): via INTRAVENOUS

## 2012-05-12 MED ORDER — ONDANSETRON HCL 4 MG PO TABS: 4.0000 mg | ORAL_TABLET | ORAL | Status: DC | PRN

## 2012-05-12 MED ORDER — BACITRACIN ZINC 500 UNIT/GM EX OINT
1.0000 "application " | TOPICAL_OINTMENT | Freq: Three times a day (TID) | CUTANEOUS | Status: DC
  Administered 2012-05-12 – 2012-05-13 (×2): 1 via TOPICAL
  Filled 2012-05-12: qty 15

## 2012-05-12 MED ORDER — IRBESARTAN 150 MG PO TABS
150.0000 mg | ORAL_TABLET | Freq: Every day | ORAL | Status: DC
  Filled 2012-05-12: qty 1

## 2012-05-12 MED ORDER — CEFAZOLIN SODIUM-DEXTROSE 2-3 GM-% IV SOLR
INTRAVENOUS | Status: AC
  Filled 2012-05-12: qty 50

## 2012-05-12 MED ORDER — LEVETIRACETAM 250 MG PO TABS
250.0000 mg | ORAL_TABLET | Freq: Two times a day (BID) | ORAL | Status: DC
  Administered 2012-05-12: 250 mg via ORAL
  Filled 2012-05-12 (×3): qty 1

## 2012-05-12 MED ORDER — CEFAZOLIN SODIUM 1-5 GM-% IV SOLN: 1.0000 g | Freq: Three times a day (TID) | INTRAVENOUS | Status: DC

## 2012-05-12 MED ORDER — AMLODIPINE BESYLATE 2.5 MG PO TABS
2.5000 mg | ORAL_TABLET | Freq: Every day | ORAL | Status: DC
  Filled 2012-05-12: qty 1

## 2012-05-12 MED ORDER — LIDOCAINE-EPINEPHRINE 1 %-1:100000 IJ SOLN
INTRAMUSCULAR | Status: AC
  Filled 2012-05-12: qty 1

## 2012-05-12 MED ORDER — CARBIDOPA-LEVODOPA 25-100 MG PO TABS
1.0000 | ORAL_TABLET | Freq: Three times a day (TID) | ORAL | Status: DC
  Administered 2012-05-12: 1 via ORAL
  Filled 2012-05-12 (×5): qty 1

## 2012-05-12 MED ORDER — HEMOSTATIC AGENTS (NO CHARGE) OPTIME
TOPICAL | Status: DC | PRN
  Administered 2012-05-12: 1 via TOPICAL

## 2012-05-12 MED ORDER — LIDOCAINE HCL 4 % MT SOLN
OROMUCOSAL | Status: DC | PRN
  Administered 2012-05-12: 4 mL via TOPICAL

## 2012-05-12 MED ORDER — DOCUSATE SODIUM 100 MG PO CAPS
100.0000 mg | ORAL_CAPSULE | Freq: Two times a day (BID) | ORAL | Status: DC
  Administered 2012-05-12: 100 mg via ORAL
  Filled 2012-05-12: qty 1

## 2012-05-12 MED ORDER — ONDANSETRON HCL 4 MG/2ML IJ SOLN: 4.0000 mg | INTRAMUSCULAR | Status: DC | PRN

## 2012-05-12 MED ORDER — LIDOCAINE HCL (CARDIAC) 20 MG/ML IV SOLN
INTRAVENOUS | Status: DC | PRN
  Administered 2012-05-12: 100 mg via INTRAVENOUS

## 2012-05-12 MED ORDER — CARVEDILOL 3.125 MG PO TABS
3.1250 mg | ORAL_TABLET | Freq: Two times a day (BID) | ORAL | Status: DC
  Administered 2012-05-12 – 2012-05-13 (×2): 3.125 mg via ORAL
  Filled 2012-05-12 (×4): qty 1

## 2012-05-12 MED ORDER — HYDROCODONE-ACETAMINOPHEN 5-325 MG PO TABS
1.0000 | ORAL_TABLET | ORAL | Status: DC | PRN
  Administered 2012-05-12: 2 via ORAL
  Filled 2012-05-12: qty 2

## 2012-05-12 MED ORDER — GLYCOPYRROLATE 0.2 MG/ML IJ SOLN
INTRAMUSCULAR | Status: DC | PRN
  Administered 2012-05-12: 0.2 mg via INTRAVENOUS

## 2012-05-12 MED ORDER — HYDROCODONE-ACETAMINOPHEN 7.5-325 MG PO TABS: 1.0000 | ORAL_TABLET | ORAL | Status: DC | PRN

## 2012-05-12 SURGICAL SUPPLY — 51 items
ATTRACTOMAT 16X20 MAGNETIC DRP (DRAPES) ×1 IMPLANT
BANDAGE GAUZE ELAST BULKY 4 IN (GAUZE/BANDAGES/DRESSINGS) IMPLANT
BLADE SURG 12 STRL SS (BLADE) ×1 IMPLANT
BLADE SURG ROTATE 9660 (MISCELLANEOUS) IMPLANT
CANISTER SUCTION 2500CC (MISCELLANEOUS) ×2 IMPLANT
CLEANER TIP ELECTROSURG 2X2 (MISCELLANEOUS) ×2 IMPLANT
CLOTH BEACON ORANGE TIMEOUT ST (SAFETY) ×2 IMPLANT
CONT SPEC 4OZ CLIKSEAL STRL BL (MISCELLANEOUS) ×2 IMPLANT
CORDS BIPOLAR (ELECTRODE) ×1 IMPLANT
COVER SURGICAL LIGHT HANDLE (MISCELLANEOUS) ×2 IMPLANT
DRAIN SNY 10 ROU (WOUND CARE) ×2 IMPLANT
DRAPE SURG 17X11 SM STRL (DRAPES) ×2 IMPLANT
ELECT COATED BLADE 2.86 ST (ELECTRODE) ×2 IMPLANT
ELECT REM PT RETURN 9FT ADLT (ELECTROSURGICAL) ×2
ELECTRODE REM PT RTRN 9FT ADLT (ELECTROSURGICAL) ×1 IMPLANT
EVACUATOR SILICONE 100CC (DRAIN) ×2 IMPLANT
GLOVE BIOGEL PI IND STRL 7.0 (GLOVE) IMPLANT
GLOVE BIOGEL PI INDICATOR 7.0 (GLOVE) ×1
GLOVE SS BIOGEL STRL SZ 7.5 (GLOVE) ×1 IMPLANT
GLOVE SUPERSENSE BIOGEL SZ 7.5 (GLOVE) ×1
GLOVE SURG SS PI 6.5 STRL IVOR (GLOVE) ×1 IMPLANT
GOWN PREVENTION PLUS XLARGE (GOWN DISPOSABLE) ×3 IMPLANT
GOWN STRL NON-REIN LRG LVL3 (GOWN DISPOSABLE) ×2 IMPLANT
HEMOSTAT SURGICEL 2X14 (HEMOSTASIS) ×1 IMPLANT
KIT BASIN OR (CUSTOM PROCEDURE TRAY) ×2 IMPLANT
KIT ROOM TURNOVER OR (KITS) ×2 IMPLANT
LOCATOR NERVE 3 VOLT (DISPOSABLE) ×1 IMPLANT
NS IRRIG 1000ML POUR BTL (IV SOLUTION) ×2 IMPLANT
PAD ARMBOARD 7.5X6 YLW CONV (MISCELLANEOUS) ×4 IMPLANT
PENCIL BUTTON HOLSTER BLD 10FT (ELECTRODE) ×2 IMPLANT
SPONGE GAUZE 4X4 12PLY (GAUZE/BANDAGES/DRESSINGS) ×1 IMPLANT
SPONGE INTESTINAL PEANUT (DISPOSABLE) ×1 IMPLANT
SPONGE LAP 18X18 X RAY DECT (DISPOSABLE) ×2 IMPLANT
SUT CHROMIC 3 0 PS 2 (SUTURE) ×2 IMPLANT
SUT ETHILON 5 0 P 3 18 (SUTURE) ×1
SUT ETHILON 8 0 BV130 4 (SUTURE) IMPLANT
SUT NYLON ETHILON 5-0 P-3 1X18 (SUTURE) ×1 IMPLANT
SUT SILK 2 0 (SUTURE) ×2
SUT SILK 2 0 FS (SUTURE) ×4 IMPLANT
SUT SILK 2-0 18XBRD TIE 12 (SUTURE) ×1 IMPLANT
SUT SILK 3 0 (SUTURE) ×2
SUT SILK 3-0 18XBRD TIE 12 (SUTURE) ×1 IMPLANT
SUT SILK 4 0 (SUTURE)
SUT SILK 4-0 18XBRD TIE 12 (SUTURE) IMPLANT
SUT VIC AB 3-0 FS2 27 (SUTURE) IMPLANT
TAPE CLOTH SURG 4X10 WHT LF (GAUZE/BANDAGES/DRESSINGS) ×1 IMPLANT
TOWEL OR 17X24 6PK STRL BLUE (TOWEL DISPOSABLE) ×2 IMPLANT
TOWEL OR 17X26 10 PK STRL BLUE (TOWEL DISPOSABLE) ×2 IMPLANT
TRAY ENT MC OR (CUSTOM PROCEDURE TRAY) ×2 IMPLANT
TRAY FOLEY CATH 14FRSI W/METER (CATHETERS) IMPLANT
WATER STERILE IRR 1000ML POUR (IV SOLUTION) ×1 IMPLANT

## 2012-05-12 NOTE — Transfer of Care (Signed)
Immediate Anesthesia Transfer of Care Note  Patient: Mark [name]  Procedure(s) Performed: Procedure(s) (LRB) with comments: PAROTIDECTOMY (Right) - PAROTIDECTOMY MASS  Patient Location: PACU  Anesthesia Type: General  Level of Consciousness: awake, alert , oriented and patient cooperative  Airway & Oxygen Therapy: Patient Spontanous Breathing and Patient connected to nasal cannula oxygen  Post-op Assessment: Report given to PACU RN, Post -op Vital signs reviewed and stable and Patient moving all extremities  Post vital signs: Reviewed and stable  Complications: No apparent anesthesia complications

## 2012-05-12 NOTE — Anesthesia Postprocedure Evaluation (Signed)
  Anesthesia Post-op Note  Patient: Mark [name]  Procedure(s) Performed: Procedure(s) (LRB) with comments: PAROTIDECTOMY (Right) - PAROTIDECTOMY MASS  Patient Location: PACU  Anesthesia Type: General  Level of Consciousness: awake  Airway and Oxygen Therapy: Patient Spontanous Breathing  Post-op Pain: mild  Post-op Assessment: Post-op Vital signs reviewed, Patient's Cardiovascular Status Stable, Respiratory Function Stable, Patent Airway, No signs of Nausea or vomiting and Pain level controlled  Post-op Vital Signs: stable  Complications: No apparent anesthesia complications

## 2012-05-12 NOTE — Progress Notes (Signed)
ARRIVED TO ROOM 16 FROM PACU, UNABLE TO MOVE SELF FROM STRETCHER TO BED,A/OX4, WIFE AT BEDSIDE,ORIENTED TO ROOM AND SURROUNDINGS, DENIES NAUSEA/PAIN AT THIS TIME.

## 2012-05-12 NOTE — Progress Notes (Signed)
Subjective: Doing well without complaints. Good facial n function  Objective: Vital signs in last 24 hours: Temp:  [97.4 F (36.3 C)-97.9 F (36.6 C)] 97.9 F (36.6 C) (09/05 1219) Pulse Rate:  [62-83] 63  (09/05 1219) Resp:  [12-22] 20  (09/05 1219) BP: (109-132)/(55-72) 116/55 mmHg (09/05 1219) SpO2:  [93 %-99 %] 93 % (09/05 1219) Weight change:  Last BM Date: 05/10/12  Intake/Output from previous day:   Intake/Output this shift: Total I/O In: 1675 [P.O.:150; I.V.:1475; IV Piggyback:50] Out: 210 [Urine:200; Drains:10]  RU:[id number] facial n function  Lab Results: No results found for this or any previous visit (from the past 24 hour(s)).  Studies/Results: No results found.  Medications: I have reviewed the patient's current medications.  Assessment/Plan: Stable post op   NEWMAN, CHRISTOPHER 05/12/2012, 5:22 PM

## 2012-05-12 NOTE — Progress Notes (Signed)
Dr. Dillard Cannon paged at [phone]. No response.

## 2012-05-12 NOTE — Anesthesia Preprocedure Evaluation (Addendum)
Anesthesia Evaluation  Patient identified by MRN, date of birth, ID band Patient awake    Reviewed: Allergy & Precautions, H&P , NPO status , Patient's Chart, lab work & pertinent test results  History of Anesthesia Complications Negative for: history of anesthetic complications  Airway Mallampati: II TM Distance: >3 FB Neck ROM: Full    Dental  (+) Dental Advidsory Given and Teeth Intact   Pulmonary neg pulmonary ROS,  breath sounds clear to auscultation  Pulmonary exam normal       Cardiovascular hypertension, + CAD and + Past MI + dysrhythmias Atrial Fibrillation Rhythm:Regular Rate:Normal - Systolic murmurs Hx MI medically managed, normal EF   Neuro/Psych Seizures -, Well Controlled,  Parkinsonism CVA, No Residual Symptoms    GI/Hepatic negative GI ROS, Neg liver ROS,   Endo/Other  negative endocrine ROS  Renal/GU negative Renal ROS     Musculoskeletal   Abdominal   Peds  Hematology negative hematology ROS (+)   Anesthesia Other Findings   Reproductive/Obstetrics                          Anesthesia Physical Anesthesia Plan  ASA: III  Anesthesia Plan: General   Post-op Pain Management:    Induction: Intravenous  Airway Management Planned: Oral ETT  Additional Equipment:   Intra-op Plan:   Post-operative Plan: Extubation in OR  Informed Consent: I have reviewed the patients History and Physical, chart, labs and discussed the procedure including the risks, benefits and alternatives for the proposed anesthesia with the patient or authorized representative who has indicated his/her understanding and acceptance.     Plan Discussed with: CRNA and Surgeon  Anesthesia Plan Comments:         Anesthesia Quick Evaluation

## 2012-05-12 NOTE — Brief Op Note (Signed)
05/12/2012  10:59 AM  PATIENT:  Mark [name]  66 y.o. male  PRE-OPERATIVE DIAGNOSIS:  RIGHT PAROTID MASS  POST-OPERATIVE DIAGNOSIS:  RIGHT PAROTID MASS  PROCEDURE:  Procedure(s) (LRB) with comments: PAROTIDECTOMY (Right) - PAROTIDECTOMY MASS  SURGEON:  Surgeon(s) and Role:    * Drema Halon, MD - Primary      PHYSICIAN ASSISTANT:   ASSISTANTS: Hermelinda Medicus, MD   ANESTHESIA:   general  EBL:  Total I/O In: 1000 [I.V.:1000] Out: -   BLOOD ADMINISTERED:none  DRAINS: (10) Jackson-Pratt drain(s) with closed bulb suction in the right parotid   LOCAL MEDICATIONS USED:  XYLOCAINE   SPECIMEN:  Source of Specimen:  right parotid mass  DISPOSITION OF SPECIMEN:  PATHOLOGY  COUNTS:  YES  TOURNIQUET:  * No tourniquets in log *  DICTATION: .Other Dictation: Dictation Number [id number]  PLAN OF CARE: Admit to inpatient   PATIENT DISPOSITION:  PACU - hemodynamically stable.   Delay start of Pharmacological VTE agent (>24hrs) due to surgical blood loss or risk of bleeding: yes

## 2012-05-12 NOTE — Interval H&P Note (Signed)
History and Physical Interval Note:  05/12/2012 7:37 AM  Mark [name]  has presented today for surgery, with the diagnosis of RIGHT PAROTID MASS  The various methods of treatment have been discussed with the patient and family. After consideration of risks, benefits and other options for treatment, the patient has consented to  Procedure(s) (LRB) with comments: PAROTIDECTOMY (Right) - PAROTIDECTOMY MASS as a surgical intervention .  The patient's history has been reviewed, patient examined, no change in status, stable for surgery.  I have reviewed the patient's chart and labs.  Questions were answered to the patient's satisfaction.     NEWMAN, CHRISTOPHER

## 2012-05-13 ENCOUNTER — Encounter (HOSPITAL_COMMUNITY): Payer: Self-pay | Admitting: General Practice

## 2012-05-13 NOTE — Discharge Summary (Signed)
Mark [name], MULLENDORE                ACCOUNT NO.:  [id number]  MEDICAL RECORD NO.:  [id number]  LOCATION:  6N16C                        FACILITY:  [facility]  PHYSICIAN:  Kristine Garbe. Ezzard Standing, M.D.DATE OF BIRTH:  1946/08/21  DATE OF ADMISSION:  05/12/2012 DATE OF DISCHARGE:  05/13/2012                              DISCHARGE SUMMARY   DIAGNOSIS: 1. Right parotid mass. 2. Coronary artery disease. 3. Alzheimer's.  OPERATIONS AND PROCEDURES DURING THIS HOSPITALIZATION:  Right superficial parotidectomy with excision of parotid mass on May 12, 2012.  HOSPITAL COURSE:  The patient was admitted via the operating room on May 12, 2012.  At which time, he underwent a right superficial parotidectomy with excision of a right parotid mass.  He received perioperative antibiotics, Ancef.  He had a JP drain which was monitored overnight with decreasing output down to 10 mL in the last shift and was removed on the 1st postoperative day, May 13, 2012.  He had good facial nerve function, minimal pain and swelling and was subsequently discharged home on his 1st postoperative day, May 13, 2012.  He remained afebrile with stable vital signs throughout the hospital course.  DISPOSITION:  The patient is discharged home.  We will have him follow up in my office in 1 week.  He has pain medicine at home.  He will continue with his regular medications.  We will restart his Plavix in 2 days and aspirin in 2 days.          ______________________________ Kristine Garbe Ezzard Standing, M.D.     CEN/MEDQ  D:  05/13/2012  T:  05/13/2012  Job:  [id number]

## 2012-05-13 NOTE — Progress Notes (Signed)
Discharge home. Home instruction given to wife.

## 2012-05-13 NOTE — Progress Notes (Signed)
POD 1 VSS AF JP 10cc last shift and was removed No significant swelling . Good nerve function. Discharge home and follow up 1 week.      Dictated [id number]

## 2012-05-13 NOTE — Op Note (Signed)
NAMECLEON, [name]                ACCOUNT NO.:  [id number]  MEDICAL RECORD NO.:  [id number]  LOCATION:  6N16C                        FACILITY:  [facility]  PHYSICIAN:  Kristine Garbe. Ezzard Standing, M.D.DATE OF BIRTH:  October 14, 1945  DATE OF PROCEDURE:  05/12/2012 DATE OF DISCHARGE:                              OPERATIVE REPORT   PREOPERATIVE DIAGNOSIS:  Right parotid mass.  POSTOPERATIVE DIAGNOSIS:  Right parotid mass.  OPERATION PERFORMED:  Right superficial parotidectomy with removal of right parotid mass and facial nerve dissection.  SURGEON:  Kristine Garbe. Ezzard Standing, MD  ASSISTANT SURGEON:  Hermelinda Medicus, MD  ANESTHESIA:  General endotracheal.  COMPLICATIONS:  None.  ESTIMATED BLOOD LOSS:  50 mL.  COMPLICATIONS:  None.  DRAINS:  A 10 flat perforated JP drain.  BRIEF CLINICAL INDICATION:  Mark [name] is a 66 year old gentleman with significant cardiac history, having had a questionable MI several months ago.  He has had a slowly enlarging right parotid mass now for about 2 years.  He was initially seen in February with about a 3.5-4 cm mass, but because of recent questionable MI, it was elected to wait 6 months prior to taking him to surgery to remove the parotid mass.  The mass is located in the inferior aspect of the parotid gland over the inferior aspect of the mandible.  He has normal facial nerve function.  The mass is mobile.  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  He has normal facial nerve function preoperatively.  He was taken to the operating room at this time for right parotidectomy with facial nerve dissection and excision of the parotid mass.  Reviewed with the patient and wife, risks of surgery including facial nerve function problems.  DESCRIPTION OF PROCEDURE:  After adequate endotracheal anesthesia, right face and neck were prepped with Betadine solution and draped out with sterile towels.  The  proposed incision was marked out and injected with Xylocaine with epinephrine for hemostasis.  A standard incision was then performed.  Subcutaneous flap was elevated over the parotid fascia anteriorly to the anterior portion of the mass.  The mass was large inferior and extended fairly anteriorly along the mandible region. Following elevation of the skin flaps anteriorly, inferiorly and posteriorly, dissection was carried down along the ear cartilage down to identify the facial nerve trunk.  Hemostasis was obtained with bipolar cautery and 3-0 silk ligatures.  The facial nerve trunk was identified in its normal anatomical position just inferior to the styloid process. The nerve trunk was then dissected out and the upper and lower split of the facial nerve was identified.  Dissection was carried out along the upper portion of the facial nerve.  The mass was over the marginal branch of the facial nerve and dissection was carried out involving the inferior branches of the facial nerve.  With the facial nerve under direct visualization, the mass was dissected off of the inferior branches of the facial nerve.  Bipolar was used for hemostasis as well as 3-0 silk ligatures.  Nerve stimulator was used to identify any branches of the facial nerve.  Mass was excised and sent to Pathology in formalin.  Hemostasis was obtained with bipolar cautery.  Wound was irrigated with saline.  Single piece of Surgicel was placed along the facial nerve trunk for hemostasis.  A 10 flat perforated Jackson-Pratt drain was then placed and brought out through a separate stab incision behind the ear.  Wound was closed with 3-0 chromic sutures subcutaneously and 5-0 nylon to reapproximate the skin edges.  The patient was subsequently awakened from anesthesia and transferred to the recovery room, postop doing well.  DISPOSITION:  Mark [name] will be observed 24 hours in the hospital.  We will plan on removing his JP drain  in the morning and discharging him home on Tylenol and Vicodin p.r.n. pain.  He will receive a perioperative Ancef while in the hospital.          ______________________________ Kristine Garbe. Ezzard Standing, M.D.     CEN/MEDQ  D:  05/12/2012  T:  05/13/2012  Job:  [id number]  cc:   Renaye Rakers, M.D. Vesta Mixer, M.D. Hermelinda Medicus, M.D.

## 2012-06-16 ENCOUNTER — Telehealth: Payer: Self-pay | Admitting: Cardiovascular Disease

## 2012-06-16 NOTE — Telephone Encounter (Signed)
lmtcb

## 2012-06-16 NOTE — Telephone Encounter (Signed)
Wife calling re pt's BP , if not available please leave number she can call back , [phone]

## 2012-06-19 ENCOUNTER — Inpatient Hospital Stay (HOSPITAL_COMMUNITY): Payer: Medicare HMO

## 2012-06-19 ENCOUNTER — Emergency Department (HOSPITAL_COMMUNITY): Payer: Medicare HMO

## 2012-06-19 ENCOUNTER — Encounter (HOSPITAL_COMMUNITY): Payer: Self-pay | Admitting: Emergency Medicine

## 2012-06-19 ENCOUNTER — Inpatient Hospital Stay (HOSPITAL_COMMUNITY)
Admission: EM | Admit: 2012-06-19 | Discharge: 2012-06-25 | DRG: 872 | Disposition: A | Discharge status: Skilled Nursing Facility | Payer: Medicare HMO | Attending: Internal Medicine | Admitting: Internal Medicine

## 2012-06-19 DIAGNOSIS — Z6833 Body mass index (BMI) 33.0-33.9, adult: Secondary | ICD-10-CM

## 2012-06-19 DIAGNOSIS — Z85828 Personal history of other malignant neoplasm of skin: Secondary | ICD-10-CM

## 2012-06-19 DIAGNOSIS — A419 Sepsis, unspecified organism: Secondary | ICD-10-CM | POA: Diagnosis present

## 2012-06-19 DIAGNOSIS — R319 Hematuria, unspecified: Secondary | ICD-10-CM | POA: Diagnosis present

## 2012-06-19 DIAGNOSIS — M129 Arthropathy, unspecified: Secondary | ICD-10-CM | POA: Diagnosis present

## 2012-06-19 DIAGNOSIS — E860 Dehydration: Secondary | ICD-10-CM | POA: Diagnosis present

## 2012-06-19 DIAGNOSIS — R296 Repeated falls: Secondary | ICD-10-CM

## 2012-06-19 DIAGNOSIS — G20 Parkinson's disease: Secondary | ICD-10-CM

## 2012-06-19 DIAGNOSIS — N390 Urinary tract infection, site not specified: Secondary | ICD-10-CM | POA: Diagnosis present

## 2012-06-19 DIAGNOSIS — Z9181 History of falling: Secondary | ICD-10-CM

## 2012-06-19 DIAGNOSIS — D649 Anemia, unspecified: Secondary | ICD-10-CM | POA: Diagnosis present

## 2012-06-19 DIAGNOSIS — A4151 Sepsis due to Escherichia coli [E. coli]: Principal | ICD-10-CM | POA: Diagnosis present

## 2012-06-19 DIAGNOSIS — I252 Old myocardial infarction: Secondary | ICD-10-CM

## 2012-06-19 DIAGNOSIS — K769 Liver disease, unspecified: Secondary | ICD-10-CM | POA: Diagnosis present

## 2012-06-19 DIAGNOSIS — Z79899 Other long term (current) drug therapy: Secondary | ICD-10-CM

## 2012-06-19 DIAGNOSIS — G4730 Sleep apnea, unspecified: Secondary | ICD-10-CM | POA: Diagnosis present

## 2012-06-19 DIAGNOSIS — G20A1 Parkinson's disease without dyskinesia, without mention of fluctuations: Secondary | ICD-10-CM | POA: Diagnosis present

## 2012-06-19 DIAGNOSIS — I10 Essential (primary) hypertension: Secondary | ICD-10-CM | POA: Diagnosis present

## 2012-06-19 DIAGNOSIS — Z23 Encounter for immunization: Secondary | ICD-10-CM

## 2012-06-19 DIAGNOSIS — R5381 Other malaise: Secondary | ICD-10-CM | POA: Diagnosis present

## 2012-06-19 DIAGNOSIS — D509 Iron deficiency anemia, unspecified: Secondary | ICD-10-CM | POA: Diagnosis present

## 2012-06-19 DIAGNOSIS — Z9861 Coronary angioplasty status: Secondary | ICD-10-CM

## 2012-06-19 DIAGNOSIS — N179 Acute kidney failure, unspecified: Secondary | ICD-10-CM | POA: Diagnosis present

## 2012-06-19 DIAGNOSIS — E669 Obesity, unspecified: Secondary | ICD-10-CM | POA: Diagnosis present

## 2012-06-19 DIAGNOSIS — N10 Acute tubulo-interstitial nephritis: Secondary | ICD-10-CM | POA: Diagnosis present

## 2012-06-19 DIAGNOSIS — Z7982 Long term (current) use of aspirin: Secondary | ICD-10-CM

## 2012-06-19 DIAGNOSIS — G40909 Epilepsy, unspecified, not intractable, without status epilepticus: Secondary | ICD-10-CM | POA: Diagnosis present

## 2012-06-19 DIAGNOSIS — E876 Hypokalemia: Secondary | ICD-10-CM | POA: Diagnosis present

## 2012-06-19 DIAGNOSIS — I959 Hypotension, unspecified: Secondary | ICD-10-CM | POA: Diagnosis present

## 2012-06-19 DIAGNOSIS — K7689 Other specified diseases of liver: Secondary | ICD-10-CM | POA: Diagnosis present

## 2012-06-19 DIAGNOSIS — I4891 Unspecified atrial fibrillation: Secondary | ICD-10-CM | POA: Diagnosis present

## 2012-06-19 DIAGNOSIS — I69998 Other sequelae following unspecified cerebrovascular disease: Secondary | ICD-10-CM

## 2012-06-19 DIAGNOSIS — R6520 Severe sepsis without septic shock: Secondary | ICD-10-CM | POA: Diagnosis present

## 2012-06-19 DIAGNOSIS — R7881 Bacteremia: Secondary | ICD-10-CM

## 2012-06-19 DIAGNOSIS — I2510 Atherosclerotic heart disease of native coronary artery without angina pectoris: Secondary | ICD-10-CM | POA: Diagnosis present

## 2012-06-19 DIAGNOSIS — R627 Adult failure to thrive: Secondary | ICD-10-CM | POA: Diagnosis present

## 2012-06-19 DIAGNOSIS — B9620 Unspecified Escherichia coli [E. coli] as the cause of diseases classified elsewhere: Secondary | ICD-10-CM | POA: Diagnosis present

## 2012-06-19 HISTORY — DX: Bacteremia: R78.81

## 2012-06-19 HISTORY — DX: Acute pyelonephritis: N10

## 2012-06-19 HISTORY — DX: Other diseases of salivary glands: K11.8

## 2012-06-19 LAB — CBC WITH DIFFERENTIAL/PLATELET
Basophils Absolute: 0 10*3/uL (ref 0.0–0.1)
Basophils Relative: 0 % (ref 0–1)
Eosinophils Absolute: 0 10*3/uL (ref 0.0–0.7)
Eosinophils Relative: 0 % (ref 0–5)
HCT: 29.8 % — ABNORMAL LOW (ref 39.0–52.0)
Hemoglobin: 10.5 g/dL — ABNORMAL LOW (ref 13.0–17.0)
Lymphs Abs: 1.3 10*3/uL (ref 0.7–4.0)
MCH: 30.6 pg (ref 26.0–34.0)
MCV: 86.9 fL (ref 78.0–100.0)
Monocytes Relative: 8 % (ref 3–12)
RBC: 3.43 MIL/uL — ABNORMAL LOW (ref 4.22–5.81)

## 2012-06-19 LAB — COMPREHENSIVE METABOLIC PANEL
ALT: 9 U/L (ref 0–53)
AST: 20 U/L (ref 0–37)
Albumin: 3.2 g/dL — ABNORMAL LOW (ref 3.5–5.2)
Chloride: 93 mEq/L — ABNORMAL LOW (ref 96–112)
Creatinine, Ser: 3.09 mg/dL — ABNORMAL HIGH (ref 0.50–1.35)
Potassium: 2.2 mEq/L — CL (ref 3.5–5.1)
Sodium: 133 mEq/L — ABNORMAL LOW (ref 135–145)
Total Bilirubin: 0.4 mg/dL (ref 0.3–1.2)

## 2012-06-19 LAB — URINALYSIS, ROUTINE W REFLEX MICROSCOPIC
Bilirubin Urine: NEGATIVE
Glucose, UA: NEGATIVE mg/dL
Specific Gravity, Urine: 1.02 (ref 1.005–1.030)
pH: 6 (ref 5.0–8.0)

## 2012-06-19 LAB — URINE MICROSCOPIC-ADD ON

## 2012-06-19 MED ORDER — SODIUM CHLORIDE 0.9 % IV BOLUS (SEPSIS)
1000.0000 mL | Freq: Once | INTRAVENOUS | Status: AC
  Administered 2012-06-19: 1000 mL via INTRAVENOUS

## 2012-06-19 MED ORDER — ACETAMINOPHEN 650 MG RE SUPP
RECTAL | Status: AC
  Administered 2012-06-19: 650 mg via RECTAL
  Filled 2012-06-19: qty 1

## 2012-06-19 MED ORDER — DEXTROSE 5 % IV SOLN
1.0000 g | Freq: Once | INTRAVENOUS | Status: AC
  Administered 2012-06-19: 1 g via INTRAVENOUS
  Filled 2012-06-19: qty 10

## 2012-06-19 MED ORDER — SODIUM CHLORIDE 0.9 % IV SOLN: INTRAVENOUS | Status: DC

## 2012-06-19 MED ORDER — ACETAMINOPHEN 650 MG RE SUPP
650.0000 mg | Freq: Once | RECTAL | Status: AC
  Administered 2012-06-19: 650 mg via RECTAL

## 2012-06-19 MED ORDER — POTASSIUM CHLORIDE 10 MEQ/100ML IV SOLN
10.0000 meq | INTRAVENOUS | Status: DC
  Administered 2012-06-19 – 2012-06-20 (×4): 10 meq via INTRAVENOUS
  Filled 2012-06-19 (×4): qty 100

## 2012-06-19 MED ORDER — SODIUM CHLORIDE 0.9 % IV BOLUS (SEPSIS)
500.0000 mL | Freq: Once | INTRAVENOUS | Status: AC
  Administered 2012-06-19: 1000 mL via INTRAVENOUS

## 2012-06-19 NOTE — ED Notes (Signed)
Talked with wife who is his primary caretaker. She states he has been total care for years, recently stopped eating and drinking. Sits in chair at all times. Pt unable to assist in any movement. Wife states she cannot continue to care for him at home.

## 2012-06-19 NOTE — ED Notes (Signed)
CRITICAL VALUE ALERT  Critical value received: K+ 2.2  Date of notification:  06/19/12  Time of notification:  2030  Critical value read back:yes  Nurse who received alert:  Eustace Quail RN  MD notified (1st page):  Dr. Adriana Simas  Time of first page:  2031  MD notified (2nd page):  Time of second page:  Responding MD:    Time MD responded:

## 2012-06-19 NOTE — ED Notes (Signed)
Pt wife states pt has became increasingly weak over the past 2 weeks and she is unable to care for him at home. Fever 101.1.

## 2012-06-19 NOTE — H&P (Addendum)
Mark [name] is an 66 y.o. male.    PCP: Mark Pitter, MD  His neurologist is Dr. Gerilyn [name]  Chief Complaint: Frequent falls, poor by mouth intake for 2 weeks  HPI: This is a 66 year old, Caucasian male, with a past medical history of Parkinson's disease, coronary artery disease, seizure disorder who lives with his wife. Patient has some memory issues and cognitive impairment and, so, not much history was available from him. His wife is at the bedside and she was able to provide much of the history. According to the wife patient's Parkinson's disease has been getting worse over the last one year. He has required assistance with activities of daily living. He's had frequent falls. He had a fall most recently on Monday, when he bumped his head. He requires a lot of assistance getting up and getting into the car. He doesn't walk much. And, in the last 2 weeks all of this has gotten worse. His oral intake has decreased. About 2 weeks ago patient was found to have leg swelling. So, he was taken to his primary care physician's office and was prescribed furosemide, as well as allopurinol. The swelling has improved since then. And there is no history of fever or chills. No history nausea, vomiting. He does have a history of constipation. His oral intake has been very poor in the last few days. He was not having any fluids as well. He also has a history of right arm weakness, which has been ongoing for many years. Patient is mainly complaining of back pain, which started ever since he has been lying on the stretcher in the emergency department. There is no history of fall today. His wife is extremely exhausted from all care she has been providing to him for the last 2 years and she is unable to care for him any further. He underwent right superficial parotidectomy on September 5.   Home Medications: Prior to Admission medications   Medication Sig Start Date End Date Taking? Authorizing Provider  amLODipine  (NORVASC) 5 MG tablet Take 0.5 tablets (2.5 mg total) by mouth daily. 04/05/12  Yes Vesta Mixer, MD  aspirin EC 81 MG tablet Take 81 mg by mouth daily.     Yes Historical Provider, MD  carbidopa-levodopa (SINEMET) 25-100 MG per tablet Take 1 tablet by mouth Three times a day. 09/23/11  Yes Historical Provider, MD  carvedilol (COREG) 3.125 MG tablet Take 1 tablet (3.125 mg total) by mouth 2 (two) times daily with a meal. 04/05/12  Yes Vesta Mixer, MD  clobetasol (TEMOVATE) 0.05 % external solution Apply 1 application topically every other day as needed. For scalp   Yes Historical Provider, MD  clopidogrel (PLAVIX) 75 MG tablet Take 1 tablet (75 mg total) by mouth daily. 05/15/12  Yes Drema Halon, MD  docusate sodium (COLACE) 100 MG capsule Take 100 mg by mouth 2 (two) times daily.   Yes Historical Provider, MD  fluticasone (CUTIVATE) 0.05 % cream Apply 1 application topically 2 (two) times daily as needed. Apply to affected areas on face   Yes Historical Provider, MD  HYDROcodone-acetaminophen (NORCO) 7.5-325 MG per tablet Take 1 tablet by mouth Once daily as needed. FOR PAIN 10/14/11  Yes Historical Provider, MD  levETIRAcetam (KEPPRA) 250 MG tablet Take 250 mg by mouth Twice daily.  09/23/11  Yes Historical Provider, MD  rosuvastatin (CRESTOR) 20 MG tablet Take 1 tablet (20 mg total) by mouth daily. 04/05/12  Yes Vesta Mixer, MD  valsartan-hydrochlorothiazide (DIOVAN-HCT) 160-12.5 MG per tablet Take 1 tablet by mouth daily.    Yes Historical Provider, MD  BACTROBAN 2 % Apply 1 application topically daily as needed.  07/23/11   Historical Provider, MD  nitroGLYCERIN (NITROSTAT) 0.4 MG SL tablet Place 0.4 mg under the tongue every 5 (five) minutes x 3 doses as needed. For chest pain    Historical Provider, MD    Allergies: No Known Allergies  Past Medical History: Past Medical History  Diagnosis Date  . Hypertension   . PAF (paroxysmal atrial fibrillation)   . Parkinson's disease     . Gout   . Coronary artery disease     SEVERE 3-VESSEL DX WITH NORMAL EF  . Fall   . Fracture of right olecranon process 10/23/2011  . Stones in the urinary tract   . Arthritis   . Stroke 10/12    Hx of remote stroke, RT SIDED WEAKNESS  . Non-ST elevated myocardial infarction (non-STEMI)   . MI (myocardial infarction) 08/2011    /H&P (05/12/2012)  . Sleep apnea   . Seizures 12/10/2011    HAD SEIZURE  . Skin cancer     left shoulder    Past Surgical History  Procedure Date  . Transthoracic echocardiogram 06/26/2011    EF 55-60%  . Orif elbow fracture 12/29/2011    Procedure: OPEN REDUCTION INTERNAL FIXATION (ORIF) ELBOW/OLECRANON FRACTURE;  Surgeon: Eulas Post, MD;  Location: [facility] OR;  Service: Orthopedics;  Laterality: Right;  . Hardware removal 12/29/2011    Procedure: HARDWARE REMOVAL;  Surgeon: Eulas Post, MD;  Location: [facility] OR;  Service: Orthopedics;  Laterality: Right;  . Parotidectomy 05/12/2012    right  . Cardiac catheterization 06/26/2011    NORMAL LV SYSTOLIC FUNCTION. RCA IS LARGE AND DOMINANT  . Coronary angioplasty with stent placement ~ 2000    "one that I know of"  . Skin cancer excision 2013    left shoulder  . Parotidectomy 05/12/2012    Procedure: PAROTIDECTOMY;  Surgeon: Drema Halon, MD;  Location: [facility] OR;  Service: ENT;  Laterality: Right;  PAROTIDECTOMY MASS    Social History:  reports that he has never smoked. He has never used smokeless tobacco. He reports that he does not drink alcohol or use illicit drugs.  Family History:  Family History  Problem Relation Age of Onset  . Heart disease    . Arthritis      Review of Systems - unobtainable from patient due to mental status  Physical Examination Blood pressure 110/59, pulse 87, temperature 101.1 F (38.4 C), temperature source Oral, resp. rate 16, SpO2 94.00%.  General appearance: appears older than stated age, distracted, no distress and slowed mentation Head: Normocephalic, without  obvious abnormality, atraumatic Eyes: conjunctivae/corneas clear. PERRL, EOM's intact.  Throat: dry mm Neck: no adenopathy, no carotid bruit, no JVD, supple, symmetrical, trachea midline and thyroid not enlarged, symmetric, no tenderness/mass/nodules Resp: clear to auscultation bilaterally Cardio: regular rate and rhythm, S1, S2 normal, no murmur, click, rub or gallop GI: soft, non-tender; bowel sounds normal; no masses,  no organomegaly Extremities: extremities normal, atraumatic, no cyanosis or edema Pulses: 2+ and symmetric Skin: Skin color, texture, turgor normal. No rashes or lesions Lymph nodes: Cervical, supraclavicular, and axillary nodes normal. Neurologic: He is alert. Confused. Rigidity is noted in all extremities. He is able to move all of his extremities except the right arm. This is apparently chronic. No focal deficits appreciated.  Laboratory Data: Results for orders placed  during the hospital encounter of 06/19/12 (from the past 48 hour(s))  URINALYSIS, ROUTINE W REFLEX MICROSCOPIC     Status: Abnormal   Collection Time   06/19/12  7:16 PM      Component Value Range Comment   Color, Urine YELLOW  YELLOW    APPearance CLOUDY (*) CLEAR    Specific Gravity, Urine 1.020  1.005 - 1.030    pH 6.0  5.0 - 8.0    Glucose, UA NEGATIVE  NEGATIVE mg/dL    Hgb urine dipstick LARGE (*) NEGATIVE    Bilirubin Urine NEGATIVE  NEGATIVE    Ketones, ur NEGATIVE  NEGATIVE mg/dL    Protein, ur 161 (*) NEGATIVE mg/dL    Urobilinogen, UA 0.2  0.0 - 1.0 mg/dL    Nitrite POSITIVE (*) NEGATIVE    Leukocytes, UA MODERATE (*) NEGATIVE   URINE MICROSCOPIC-ADD ON     Status: Abnormal   Collection Time   06/19/12  7:16 PM      Component Value Range Comment   WBC, UA TOO NUMEROUS TO COUNT  <3 WBC/hpf    RBC / HPF 21-50  <3 RBC/hpf    Bacteria, UA MANY (*) RARE    Casts GRANULAR CAST (*) NEGATIVE   CBC WITH DIFFERENTIAL     Status: Abnormal   Collection Time   06/19/12  7:36 PM       Component Value Range Comment   WBC 14.4 (*) 4.0 - 10.5 K/uL    RBC 3.43 (*) 4.22 - 5.81 MIL/uL    Hemoglobin 10.5 (*) 13.0 - 17.0 g/dL    HCT 09.6 (*) 04.5 - 52.0 %    MCV 86.9  78.0 - 100.0 fL    MCH 30.6  26.0 - 34.0 pg    MCHC 35.2  30.0 - 36.0 g/dL    RDW 40.9  81.1 - 91.4 %    Platelets 276  150 - 400 K/uL    Neutrophils Relative 82 (*) 43 - 77 %    Neutro Abs 11.9 (*) 1.7 - 7.7 K/uL    Lymphocytes Relative 9 (*) 12 - 46 %    Lymphs Abs 1.3  0.7 - 4.0 K/uL    Monocytes Relative 8  3 - 12 %    Monocytes Absolute 1.2 (*) 0.1 - 1.0 K/uL    Eosinophils Relative 0  0 - 5 %    Eosinophils Absolute 0.0  0.0 - 0.7 K/uL    Basophils Relative 0  0 - 1 %    Basophils Absolute 0.0  0.0 - 0.1 K/uL   COMPREHENSIVE METABOLIC PANEL     Status: Abnormal   Collection Time   06/19/12  7:36 PM      Component Value Range Comment   Sodium 133 (*) 135 - 145 mEq/L    Potassium 2.2 (*) 3.5 - 5.1 mEq/L    Chloride 93 (*) 96 - 112 mEq/L    CO2 28  19 - 32 mEq/L    Glucose, Bld 114 (*) 70 - 99 mg/dL    BUN 52 (*) 6 - 23 mg/dL    Creatinine, Ser 7.82 (*) 0.50 - 1.35 mg/dL    Calcium 9.1  8.4 - 95.6 mg/dL    Total Protein 7.1  6.0 - 8.3 g/dL    Albumin 3.2 (*) 3.5 - 5.2 g/dL    AST 20  0 - 37 U/L    ALT 9  0 - 53 U/L    Alkaline Phosphatase  54  39 - 117 U/L    Total Bilirubin 0.4  0.3 - 1.2 mg/dL    GFR calc non Af Amer 20 (*) >90 mL/min    GFR calc Af Amer 23 (*) >90 mL/min     Radiology Reports: Dg Chest Port 1 View  06/19/2012  *RADIOLOGY REPORT*  Clinical Data: Fever.  Pneumonia.  PORTABLE CHEST - 1 VIEW  Comparison: 03/04/2012.  Findings: Low volume chest.  Basilar atelectasis.  No focal consolidation on the single frontal view.  Cardiopericardial silhouette appears similar to prior exam, within normal limits allowing for volumes of inspiration. Monitoring leads are projected over the chest.  IMPRESSION: No acute cardiopulmonary disease.  Low volume chest.   Original Report Authenticated  By: Andreas Newport, M.D.     Assessment/Plan  Principal Problem:  *ARF (acute renal failure) Active Problems:  CAD (coronary artery disease)  HTN (hypertension)  Parkinson disease  FTT (failure to thrive) in adult  UTI (lower urinary tract infection)  Dehydration  Frequent falls  Hypokalemia  Anemia  Seizure disorder   #1 acute renal failure: This is most likely prerenal azotemia secondary to poor oral intake made worse by the use of furosemide over the last couple weeks. He is making urine. Foley is in place to monitor strict ins and outs. IV fluids will be continued. If his renal function does not improve, a renal ultrasound will be obtained.  #2 severe hypokalemia: This will be repleted. Magnesium levels will be checked in the morning. He'll be monitored on telemetry.  #3 failure to thrive and dehydration with the hypotension: This is most likely due to progressive Parkinson's disease. Physical and occupational therapy will be asked to see the patient. Swallow evaluation will be obtained. His blood pressure is improved with IV fluids. He'll be monitored in the step down unit tonight  #4 progressive Parkinson's disease: Continue with Sinemet.  #5 frequent falls. We'll get a CT head that due to recent fall and bruising on his head.  #6 Urinary tract infection: Urine cultures will be obtained. He'll be treated with ceftriaxone.  #7 history of coronary artery disease, and hypertension: He has triple-vessel disease, which is not amenable to any intervention. He does not have any active cardiac issues at this time. His EF is known to be normal.  #8 history of seizure disorder: Continue with Keppra.   #9 normocytic anemia: Anemia panel will be checked in the morning. No evidence for overt bleeding.  CODE STATUS was discussed in detail with the patient's wife. She did not know if the patient had any advanced directives. I have asked her to discuss this with the patient's  daughter.  DVT, prophylaxis with SCDs.  Further management decisions will depend on results of further testing and patient's response to treatment.   [facility]  [facility] Pager [phone]  06/19/2012, 10:05 PM

## 2012-06-19 NOTE — ED Provider Notes (Signed)
History  This chart was scribed for Donnetta Hutching, MD by Erskine Emery. This patient was seen in room APA19/APA19 and the patient's care was started at 18:45.   CSN: [id number]  Arrival date & time 06/19/12  1827   First MD Initiated Contact with Patient 06/19/12 1845     Level 5 Caveat--Mental Status  Chief Complaint  Patient presents with  . Failure To Thrive    (Consider location/radiation/quality/duration/timing/severity/associated sxs/prior Treatment) Level 5 Caveat--Mental Status The history is provided by the patient and the spouse. The history is limited by the condition of the patient. No language interpreter was used.  YANDELL MCJUNKINS is a 66 y.o. male who presents to the Emergency Department complaining of failure to thrive. Currently pt has a 101.1 fever. Pt denies any pain. Pt has a h/o Parkinson's disease, HTN, PAF, CAD, MI, and stroke. Pt's blood pressure is a little low, at 92/55 currently.   Past Medical History  Diagnosis Date  . Hypertension   . PAF (paroxysmal atrial fibrillation)   . Parkinson's disease   . Gout   . Coronary artery disease     SEVERE 3-VESSEL DX WITH NORMAL EF  . Fall   . Fracture of right olecranon process 10/23/2011  . Stones in the urinary tract   . Arthritis   . Stroke 10/12    Hx of remote stroke, RT SIDED WEAKNESS  . Non-ST elevated myocardial infarction (non-STEMI)   . MI (myocardial infarction) 08/2011    /H&P (05/12/2012)  . Sleep apnea   . Seizures 12/10/2011    HAD SEIZURE  . Skin cancer     left shoulder    Past Surgical History  Procedure Date  . Transthoracic echocardiogram 06/26/2011    EF 55-60%  . Orif elbow fracture 12/29/2011    Procedure: OPEN REDUCTION INTERNAL FIXATION (ORIF) ELBOW/OLECRANON FRACTURE;  Surgeon: Eulas Post, MD;  Location: [facility] OR;  Service: Orthopedics;  Laterality: Right;  . Hardware removal 12/29/2011    Procedure: HARDWARE REMOVAL;  Surgeon: Eulas Post, MD;  Location: [facility] OR;  Service:  Orthopedics;  Laterality: Right;  . Parotidectomy 05/12/2012    right  . Cardiac catheterization 06/26/2011    NORMAL LV SYSTOLIC FUNCTION. RCA IS LARGE AND DOMINANT  . Coronary angioplasty with stent placement ~ 2000    "one that I know of"  . Skin cancer excision 2013    left shoulder  . Parotidectomy 05/12/2012    Procedure: PAROTIDECTOMY;  Surgeon: Drema Halon, MD;  Location: [facility] OR;  Service: ENT;  Laterality: Right;  PAROTIDECTOMY MASS    Family History  Problem Relation Age of Onset  . Heart disease    . Arthritis      History  Substance Use Topics  . Smoking status: Never Smoker   . Smokeless tobacco: Never Used  . Alcohol Use: No      Review of Systems  Unable to perform ROS: Mental status change    Allergies  Review of patient's allergies indicates no known allergies.  Home Medications   Current Outpatient Rx  Name Route Sig Dispense Refill  . AMLODIPINE BESYLATE 5 MG PO TABS Oral Take 0.5 tablets (2.5 mg total) by mouth daily. 30 tablet 6  . ASPIRIN EC 81 MG PO TBEC Oral Take 81 mg by mouth daily.      Marland Kitchen BACTROBAN 2 % EX CREA Topical Apply 1 application topically daily as needed.     Marland Kitchen CARBIDOPA-LEVODOPA 25-100 MG PO TABS Oral  Take 1 tablet by mouth Three times a day.    Marland Kitchen CARVEDILOL 3.125 MG PO TABS Oral Take 1 tablet (3.125 mg total) by mouth 2 (two) times daily with a meal. 60 tablet 6  . CLOBETASOL PROPIONATE 0.05 % EX SOLN Topical Apply 1 application topically every other day as needed. For scalp    . CLOPIDOGREL BISULFATE 75 MG PO TABS Oral Take 1 tablet (75 mg total) by mouth daily. 30 tablet 6    Send future refills to Norma Fredrickson at [facility] ...  . DOCUSATE SODIUM 100 MG PO CAPS Oral Take 100 mg by mouth 2 (two) times daily.    Marland Kitchen FLUTICASONE PROPIONATE 0.05 % EX CREA Topical Apply 1 application topically 2 (two) times daily as needed. Apply to affected areas on face    . HYDROCODONE-ACETAMINOPHEN 7.5-325 MG PO TABS Oral Take 1 tablet by  mouth Once daily as needed. FOR PAIN    . LEVETIRACETAM 250 MG PO TABS Oral Take 250 mg by mouth Twice daily.     Marland Kitchen NITROGLYCERIN 0.4 MG SL SUBL Sublingual Place 0.4 mg under the tongue every 5 (five) minutes x 3 doses as needed. For chest pain    . ROSUVASTATIN CALCIUM 20 MG PO TABS Oral Take 1 tablet (20 mg total) by mouth daily. 30 tablet 6  . VALSARTAN-HYDROCHLOROTHIAZIDE 160-12.5 MG PO TABS Oral Take 1 tablet by mouth daily.       Triage Vitals: BP 116/58  Pulse 100  Temp 101.1 F (38.4 C) (Oral)  Resp 16  SpO2 95%  Physical Exam  Nursing note and vitals reviewed. Constitutional: He is oriented to person, place, and time. He appears well-developed and well-nourished.       Pale. Speaking but in a low, monitone voice.  HENT:  Head: Normocephalic and atraumatic.  Eyes: Conjunctivae normal are normal.  Neck: Normal range of motion. Neck supple.  Cardiovascular: Regular rhythm and normal heart sounds.   Pulmonary/Chest: Effort normal and breath sounds normal.       Not dyspneic.  Abdominal: Soft. Bowel sounds are normal.  Musculoskeletal: Normal range of motion.  Neurological: He is alert and oriented to person, place, and time.       Minimal movement of extremities.  Skin: Skin is warm and dry.  Psychiatric: He has a normal mood and affect.    ED Course  Procedures (including critical care time) DIAGNOSTIC STUDIES: Oxygen Saturation is 95% on [place], adequate by my interpretation.    COORDINATION OF CARE: 19:05--I evaluated the patient and we discussed a treatment plan including Tylenol, urinalysis, blood work, catheterization, and possible admission to which the pt agreed.   20:49--I spoke with the pt's wife and informed her that the pt has a urinary infection and that we will be admitting him here. She reports Dr. Parke Simmers in [place] is the pt's PCP.   Labs Reviewed  URINALYSIS, ROUTINE W REFLEX MICROSCOPIC - Abnormal; Notable for the following:    APPearance CLOUDY (*)      Hgb urine dipstick LARGE (*)     Protein, ur 100 (*)     Nitrite POSITIVE (*)     Leukocytes, UA MODERATE (*)     All other components within normal limits  CBC WITH DIFFERENTIAL - Abnormal; Notable for the following:    WBC 14.4 (*)     RBC 3.43 (*)     Hemoglobin 10.5 (*)     HCT 29.8 (*)     Neutrophils Relative  82 (*)     Neutro Abs 11.9 (*)     Lymphocytes Relative 9 (*)     Monocytes Absolute 1.2 (*)     All other components within normal limits  COMPREHENSIVE METABOLIC PANEL - Abnormal; Notable for the following:    Sodium 133 (*)     Potassium 2.2 (*)     Chloride 93 (*)     Glucose, Bld 114 (*)     BUN 52 (*)     Creatinine, Ser 3.09 (*)     Albumin 3.2 (*)     GFR calc non Af Amer 20 (*)     GFR calc Af Amer 23 (*)     All other components within normal limits  URINE MICROSCOPIC-ADD ON - Abnormal; Notable for the following:    Bacteria, UA MANY (*)     Casts GRANULAR CAST (*)     All other components within normal limits  URINE CULTURE     Dg Chest Port 1 View  06/19/2012  *RADIOLOGY REPORT*  Clinical Data: Fever.  Pneumonia.  PORTABLE CHEST - 1 VIEW  Comparison: 03/04/2012.  Findings: Low volume chest.  Basilar atelectasis.  No focal consolidation on the single frontal view.  Cardiopericardial silhouette appears similar to prior exam, within normal limits allowing for volumes of inspiration. Monitoring leads are projected over the chest.  IMPRESSION: No acute cardiopulmonary disease.  Low volume chest.   Original Report Authenticated By: Andreas Newport, M.D.    No results found.   No diagnosis found.  CRITICAL CARE Performed by: Donnetta Hutching   Total critical care time:30  Critical care time was exclusive of separately billable procedures and treating other patients.  Critical care was necessary to treat or prevent imminent or life-threatening deterioration.  Critical care was time spent personally by me on the following activities: development of  treatment plan with patient and/or surrogate as well as nursing, discussions with consultants, evaluation of patient's response to treatment, examination of patient, obtaining history from patient or surrogate, ordering and performing treatments and interventions, ordering and review of laboratory studies, ordering and review of radiographic studies, pulse oximetry and re-evaluation of patient's condition.  MDM  Patient has advanced Parkinson disease. He has become increasingly weak and ill past 2 weeks. Febrile.  Patient is hypotensive. Urinalysis shows obvious infection.  Hydrate, IV Rocephin, urine culture.  Admit to general medicine      I personally performed the services described in this documentation, which was scribed in my presence. The recorded information has been reviewed and considered.    Donnetta Hutching, MD 06/19/12 2236

## 2012-06-20 ENCOUNTER — Inpatient Hospital Stay (HOSPITAL_COMMUNITY): Payer: Medicare HMO

## 2012-06-20 ENCOUNTER — Encounter (HOSPITAL_COMMUNITY): Payer: Medicare HMO

## 2012-06-20 ENCOUNTER — Encounter (HOSPITAL_COMMUNITY): Payer: Self-pay | Admitting: *Deleted

## 2012-06-20 DIAGNOSIS — A419 Sepsis, unspecified organism: Secondary | ICD-10-CM

## 2012-06-20 LAB — COMPREHENSIVE METABOLIC PANEL
ALT: 12 U/L (ref 0–53)
AST: 21 U/L (ref 0–37)
Albumin: 2.4 g/dL — ABNORMAL LOW (ref 3.5–5.2)
Alkaline Phosphatase: 44 U/L (ref 39–117)
Chloride: 103 mEq/L (ref 96–112)
Potassium: 2.5 mEq/L — CL (ref 3.5–5.1)
Sodium: 139 mEq/L (ref 135–145)
Total Bilirubin: 0.4 mg/dL (ref 0.3–1.2)
Total Protein: 5.6 g/dL — ABNORMAL LOW (ref 6.0–8.3)

## 2012-06-20 LAB — IRON AND TIBC
Iron: 15 ug/dL — ABNORMAL LOW (ref 42–135)
TIBC: 165 ug/dL — ABNORMAL LOW (ref 215–435)
UIBC: 150 ug/dL (ref 125–400)

## 2012-06-20 LAB — CBC
HCT: 27.1 % — ABNORMAL LOW (ref 39.0–52.0)
MCH: 30.6 pg (ref 26.0–34.0)
MCV: 87.4 fL (ref 78.0–100.0)
Platelets: 242 10*3/uL (ref 150–400)
RDW: 13.2 % (ref 11.5–15.5)

## 2012-06-20 LAB — BASIC METABOLIC PANEL
BUN: 39 mg/dL — ABNORMAL HIGH (ref 6–23)
Chloride: 103 mEq/L (ref 96–112)
Creatinine, Ser: 2.56 mg/dL — ABNORMAL HIGH (ref 0.50–1.35)
GFR calc Af Amer: 28 mL/min — ABNORMAL LOW (ref >90)
GFR calc non Af Amer: 25 mL/min — ABNORMAL LOW (ref >90)
Potassium: 3.4 mEq/L — ABNORMAL LOW (ref 3.5–5.1)

## 2012-06-20 LAB — APTT: aPTT: 40 seconds — ABNORMAL HIGH (ref 24–37)

## 2012-06-20 LAB — FERRITIN: Ferritin: 328 ng/mL — ABNORMAL HIGH (ref 22–322)

## 2012-06-20 LAB — MRSA PCR SCREENING: MRSA by PCR: NEGATIVE

## 2012-06-20 LAB — FOLATE: Folate: 7.8 ng/mL

## 2012-06-20 MED ORDER — ASPIRIN EC 81 MG PO TBEC
81.0000 mg | DELAYED_RELEASE_TABLET | Freq: Every day | ORAL | Status: DC
  Administered 2012-06-20 – 2012-06-25 (×6): 81 mg via ORAL
  Filled 2012-06-20 (×6): qty 1

## 2012-06-20 MED ORDER — CLOPIDOGREL BISULFATE 75 MG PO TABS
75.0000 mg | ORAL_TABLET | Freq: Every day | ORAL | Status: DC
  Administered 2012-06-20 – 2012-06-25 (×6): 75 mg via ORAL
  Filled 2012-06-20 (×6): qty 1

## 2012-06-20 MED ORDER — ALBUTEROL SULFATE (5 MG/ML) 0.5% IN NEBU: 2.5000 mg | INHALATION_SOLUTION | RESPIRATORY_TRACT | Status: DC | PRN

## 2012-06-20 MED ORDER — POTASSIUM CHLORIDE CRYS ER 20 MEQ PO TBCR
40.0000 meq | EXTENDED_RELEASE_TABLET | Freq: Once | ORAL | Status: AC
  Administered 2012-06-20: 40 meq via ORAL
  Filled 2012-06-20: qty 2

## 2012-06-20 MED ORDER — OXYCODONE HCL 5 MG PO TABS
5.0000 mg | ORAL_TABLET | ORAL | Status: DC | PRN
  Administered 2012-06-21 – 2012-06-23 (×5): 5 mg via ORAL
  Filled 2012-06-20 (×5): qty 1

## 2012-06-20 MED ORDER — POTASSIUM CHLORIDE 10 MEQ/100ML IV SOLN
INTRAVENOUS | Status: AC
  Administered 2012-06-20: 10 meq via INTRAVENOUS
  Filled 2012-06-20: qty 100

## 2012-06-20 MED ORDER — ACETAMINOPHEN 650 MG RE SUPP
650.0000 mg | Freq: Four times a day (QID) | RECTAL | Status: DC | PRN
  Administered 2012-06-20 – 2012-06-22 (×2): 650 mg via RECTAL
  Filled 2012-06-20 (×2): qty 1

## 2012-06-20 MED ORDER — SODIUM CHLORIDE 0.9 % IJ SOLN
10.0000 mL | Freq: Two times a day (BID) | INTRAMUSCULAR | Status: DC
  Administered 2012-06-20 (×2): 10 mL
  Administered 2012-06-21: 20 mL
  Administered 2012-06-21 – 2012-06-22 (×2): 10 mL
  Administered 2012-06-22: 20 mL
  Administered 2012-06-23 – 2012-06-24 (×2): 10 mL
  Administered 2012-06-24 – 2012-06-25 (×2): 20 mL
  Filled 2012-06-20 (×2): qty 6
  Filled 2012-06-20: qty 3
  Filled 2012-06-20: qty 6
  Filled 2012-06-20 (×4): qty 3

## 2012-06-20 MED ORDER — POTASSIUM CHLORIDE CRYS ER 20 MEQ PO TBCR: 40.0000 meq | EXTENDED_RELEASE_TABLET | Freq: Two times a day (BID) | ORAL | Status: DC

## 2012-06-20 MED ORDER — DOCUSATE SODIUM 100 MG PO CAPS
100.0000 mg | ORAL_CAPSULE | Freq: Two times a day (BID) | ORAL | Status: DC
  Administered 2012-06-20 – 2012-06-25 (×11): 100 mg via ORAL
  Filled 2012-06-20 (×11): qty 1

## 2012-06-20 MED ORDER — INFLUENZA VIRUS VACC SPLIT PF IM SUSP
0.5000 mL | INTRAMUSCULAR | Status: AC
  Administered 2012-06-21: 0.5 mL via INTRAMUSCULAR
  Filled 2012-06-20: qty 0.5

## 2012-06-20 MED ORDER — BOOST / RESOURCE BREEZE PO LIQD
1.0000 | Freq: Two times a day (BID) | ORAL | Status: DC
  Administered 2012-06-20 – 2012-06-25 (×8): 1 via ORAL
  Filled 2012-06-20 (×15): qty 1

## 2012-06-20 MED ORDER — VANCOMYCIN HCL 1000 MG IV SOLR
1500.0000 mg | Freq: Once | INTRAVENOUS | Status: AC
  Administered 2012-06-20: 1500 mg via INTRAVENOUS
  Filled 2012-06-20: qty 1500

## 2012-06-20 MED ORDER — DOPAMINE-DEXTROSE 3.2-5 MG/ML-% IV SOLN
2.0000 ug/kg/min | INTRAVENOUS | Status: DC
  Administered 2012-06-20: 2 ug/kg/min via INTRAVENOUS
  Filled 2012-06-20: qty 250

## 2012-06-20 MED ORDER — SODIUM CHLORIDE 0.9 % IV BOLUS (SEPSIS)
1000.0000 mL | Freq: Once | INTRAVENOUS | Status: AC
  Administered 2012-06-20: 1000 mL via INTRAVENOUS

## 2012-06-20 MED ORDER — PIPERACILLIN-TAZOBACTAM 3.375 G IVPB
3.3750 g | Freq: Three times a day (TID) | INTRAVENOUS | Status: DC
  Administered 2012-06-20 – 2012-06-23 (×9): 3.375 g via INTRAVENOUS
  Filled 2012-06-20 (×9): qty 50

## 2012-06-20 MED ORDER — POTASSIUM CHLORIDE CRYS ER 20 MEQ PO TBCR
40.0000 meq | EXTENDED_RELEASE_TABLET | Freq: Every day | ORAL | Status: DC
  Administered 2012-06-20: 40 meq via ORAL
  Filled 2012-06-20: qty 2

## 2012-06-20 MED ORDER — SODIUM CHLORIDE 0.9 % IV SOLN
INTRAVENOUS | Status: DC
  Administered 2012-06-20: 1000 mL via INTRAVENOUS

## 2012-06-20 MED ORDER — POTASSIUM CHLORIDE 10 MEQ/100ML IV SOLN
10.0000 meq | INTRAVENOUS | Status: AC
  Administered 2012-06-20 (×4): 10 meq via INTRAVENOUS
  Filled 2012-06-20 (×3): qty 100

## 2012-06-20 MED ORDER — DEXTROSE 5 % IV SOLN
1.0000 g | INTRAVENOUS | Status: DC
  Filled 2012-06-20: qty 10

## 2012-06-20 MED ORDER — ACETAMINOPHEN 325 MG PO TABS
650.0000 mg | ORAL_TABLET | Freq: Four times a day (QID) | ORAL | Status: DC | PRN
Start: 2012-06-20 — End: 2012-06-25
  Administered 2012-06-20 – 2012-06-21 (×2): 650 mg via ORAL
  Filled 2012-06-20 (×2): qty 2

## 2012-06-20 MED ORDER — LEVETIRACETAM 500 MG PO TABS
250.0000 mg | ORAL_TABLET | Freq: Two times a day (BID) | ORAL | Status: DC
  Administered 2012-06-20 – 2012-06-25 (×12): 250 mg via ORAL
  Filled 2012-06-20: qty 1
  Filled 2012-06-20: qty 2
  Filled 2012-06-20 (×10): qty 1

## 2012-06-20 MED ORDER — CARBIDOPA-LEVODOPA 25-100 MG PO TABS
1.0000 | ORAL_TABLET | Freq: Three times a day (TID) | ORAL | Status: DC
  Administered 2012-06-20 – 2012-06-25 (×16): 1 via ORAL
  Filled 2012-06-20 (×16): qty 1

## 2012-06-20 MED ORDER — ATORVASTATIN CALCIUM 40 MG PO TABS
40.0000 mg | ORAL_TABLET | Freq: Every day | ORAL | Status: DC
  Administered 2012-06-20 – 2012-06-24 (×5): 40 mg via ORAL
  Filled 2012-06-20 (×5): qty 1

## 2012-06-20 MED ORDER — SODIUM CHLORIDE 0.9 % IV BOLUS (SEPSIS)
500.0000 mL | Freq: Once | INTRAVENOUS | Status: AC
  Administered 2012-06-20: 500 mL via INTRAVENOUS

## 2012-06-20 MED ORDER — ADULT MULTIVITAMIN W/MINERALS CH
1.0000 | ORAL_TABLET | Freq: Every day | ORAL | Status: DC
  Administered 2012-06-20 – 2012-06-25 (×6): 1 via ORAL
  Filled 2012-06-20 (×6): qty 1

## 2012-06-20 MED ORDER — SODIUM CHLORIDE 0.9 % IJ SOLN
10.0000 mL | INTRAMUSCULAR | Status: DC | PRN
  Administered 2012-06-24: 20 mL

## 2012-06-20 MED ORDER — ONDANSETRON HCL 4 MG PO TABS: 4.0000 mg | ORAL_TABLET | Freq: Four times a day (QID) | ORAL | Status: DC | PRN

## 2012-06-20 MED ORDER — ONDANSETRON HCL 4 MG/2ML IJ SOLN: 4.0000 mg | Freq: Four times a day (QID) | INTRAMUSCULAR | Status: DC | PRN

## 2012-06-20 MED ORDER — POTASSIUM CHLORIDE 10 MEQ/100ML IV SOLN
10.0000 meq | INTRAVENOUS | Status: DC
  Administered 2012-06-20: 10 meq via INTRAVENOUS
  Filled 2012-06-20: qty 100

## 2012-06-20 MED ORDER — POTASSIUM CHLORIDE IN NACL 40-0.9 MEQ/L-% IV SOLN
INTRAVENOUS | Status: DC
  Administered 2012-06-20: 100 mL/h via INTRAVENOUS
  Administered 2012-06-20 – 2012-06-21 (×3): via INTRAVENOUS
  Administered 2012-06-22: 100 mL/h via INTRAVENOUS
  Administered 2012-06-22 – 2012-06-23 (×5): via INTRAVENOUS
  Filled 2012-06-20 (×16): qty 1000

## 2012-06-20 MED ORDER — PRO-STAT SUGAR FREE PO LIQD
30.0000 mL | Freq: Two times a day (BID) | ORAL | Status: DC
  Administered 2012-06-20 – 2012-06-25 (×9): 30 mL via ORAL
  Filled 2012-06-20 (×10): qty 30

## 2012-06-20 MED ORDER — VITAMIN B-1 100 MG PO TABS
100.0000 mg | ORAL_TABLET | Freq: Every day | ORAL | Status: DC
  Administered 2012-06-20 – 2012-06-25 (×6): 100 mg via ORAL
  Filled 2012-06-20 (×6): qty 1

## 2012-06-20 MED ORDER — VANCOMYCIN HCL 1000 MG IV SOLR
1500.0000 mg | INTRAVENOUS | Status: DC
  Administered 2012-06-21 – 2012-06-23 (×3): 1500 mg via INTRAVENOUS
  Filled 2012-06-20 (×7): qty 1500

## 2012-06-20 MED ORDER — BIOTENE DRY MOUTH MT LIQD
15.0000 mL | Freq: Two times a day (BID) | OROMUCOSAL | Status: DC
  Administered 2012-06-20 – 2012-06-24 (×10): 15 mL via OROMUCOSAL

## 2012-06-20 NOTE — Progress Notes (Signed)
An attempt was made to see pt for an eval.  He was sleeping very soundly, so I had an opportunity to interview his wife.  Per her report, pt has needed total care for quite some time.  At best, the pt can only take a few steps at a time with MAX assist.  They have had PT in the past and it has not helped to improve the situation.  His wife is exhausted and is asking that pt be placed in a long term nursing home setting.  No further efforts will be made to see pt.

## 2012-06-20 NOTE — Progress Notes (Signed)
ANTIBIOTIC CONSULT NOTE - INITIAL  Pharmacy Consult for Vancomycin Indication: sepsis  No Known Allergies  Patient Measurements: Height: 6' (182.9 cm) Weight: 231 lb 0.7 oz (104.8 kg) IBW/kg (Calculated) : 77.6   Vital Signs: Temp: 100.6 F (38.1 C) (10/14 0758) Temp src: Oral (10/14 0758) BP: 138/66 mmHg (10/14 0800) Pulse Rate: 102  (10/14 0800) Intake/Output from previous day: 10/13 0701 - 10/14 0700 In: 4781.7 [P.O.:180; I.V.:3401.7; IV Piggyback:1200] Out: 1475 [Urine:1475] Intake/Output from this shift:    Labs:  [facility] 06/20/12 0414 06/19/12 1936  WBC 15.3* 14.4*  HGB 9.5* 10.5*  PLT 242 276  LABCREA -- --  CREATININE 2.75* 3.09*   Estimated Creatinine Clearance: 33.1 ml/min (by C-G formula based on Cr of 2.75). No results found for this basename: VANCOTROUGH:2,VANCOPEAK:2,VANCORANDOM:2,GENTTROUGH:2,GENTPEAK:2,GENTRANDOM:2,TOBRATROUGH:2,TOBRAPEAK:2,TOBRARND:2,AMIKACINPEAK:2,AMIKACINTROU:2,AMIKACIN:2, in the last 72 hours   Microbiology: Recent Results (from the past 720 hour(s))  CULTURE, BLOOD (ROUTINE X 2)     Status: Normal (Preliminary result)   Collection Time   06/19/12  7:36 PM      Component Value Range Status Comment   Specimen Description BLOOD BLOOD RIGHT ARM   Final    Special Requests BOTTLES DRAWN AEROBIC AND ANAEROBIC 8CC   Final    Culture PENDING   Incomplete    Report Status PENDING   Incomplete   CULTURE, BLOOD (ROUTINE X 2)     Status: Normal (Preliminary result)   Collection Time   06/20/12 12:16 AM      Component Value Range Status Comment   Specimen Description BLOOD   Final    Special Requests     Final    Value: LEFT ANTECUBITAL BOTTLES DRAWN AEROBIC AND ANAEROBIC 8CC   Culture PENDING   Incomplete    Report Status PENDING   Incomplete   MRSA PCR SCREENING     Status: Normal   Collection Time   06/20/12  3:00 AM      Component Value Range Status Comment   MRSA by PCR NEGATIVE  NEGATIVE Final    Medical History: Past  Medical History  Diagnosis Date  . Hypertension   . PAF (paroxysmal atrial fibrillation)   . Parkinson's disease   . Gout   . Coronary artery disease     SEVERE 3-VESSEL DX WITH NORMAL EF  . Fall   . Fracture of right olecranon process 10/23/2011  . Stones in the urinary tract   . Arthritis   . Stroke 10/12    Hx of remote stroke, RT SIDED WEAKNESS  . Non-ST elevated myocardial infarction (non-STEMI)   . MI (myocardial infarction) 08/2011    /H&P (05/12/2012)  . Sleep apnea   . Seizures 12/10/2011    HAD SEIZURE  . Skin cancer     left shoulder   Medications:  Scheduled:    . acetaminophen  650 mg Rectal Once  . antiseptic oral rinse  15 mL Mouth Rinse BID  . aspirin EC  81 mg Oral Daily  . atorvastatin  40 mg Oral q1800  . carbidopa-levodopa  1 tablet Oral TID  . cefTRIAXone (ROCEPHIN)  IV  1 g Intravenous Once  . cefTRIAXone (ROCEPHIN)  IV  1 g Intravenous Q24H  . clopidogrel  75 mg Oral Daily  . docusate sodium  100 mg Oral BID  . levETIRAcetam  250 mg Oral BID  . multivitamin with minerals  1 tablet Oral Daily  . potassium chloride  10 mEq Intravenous Q1 Hr x 4  . potassium chloride  40  mEq Oral Once  . potassium chloride  40 mEq Oral Daily  . sodium chloride  1,000 mL Intravenous Once  . sodium chloride  1,000 mL Intravenous Once  . sodium chloride  500 mL Intravenous Once  . sodium chloride  500 mL Intravenous Once  . sodium chloride  500 mL Intravenous Once  . thiamine  100 mg Oral Daily  . vancomycin  1,500 mg Intravenous Once  . vancomycin  1,500 mg Intravenous Q24H  . DISCONTD: potassium chloride  10 mEq Intravenous Q1 Hr x 5  . DISCONTD: potassium chloride  10 mEq Intravenous Q1 Hr x 4  . DISCONTD: potassium chloride  40 mEq Oral BID   Assessment: 66yo male admitted with possible sepsis, ARI, and leukocytosis.  He is obese.  SCr improving slightly.  Goal of Therapy:  Vancomycin trough level 15-20 mcg/ml Eradicate infection.  Plan: Vancomycin 1500mg  iv  q24hrs Check trough at steady state Monitor labs, renal fxn, and cultures per protocol  Valrie Hart A 06/20/2012,9:09 AM

## 2012-06-20 NOTE — Evaluation (Signed)
Clinical/Bedside Swallow Evaluation Patient Details  Name: Mark [name] MRN: [id number] Date of Birth: 07-03-1946  Today's Date: 06/20/2012 Time: 1021-1114 SLP Time Calculation (min): 53 min  Past Medical History:  Past Medical History  Diagnosis Date  . Hypertension   . PAF (paroxysmal atrial fibrillation)   . Parkinson's disease   . Gout   . Coronary artery disease     SEVERE 3-VESSEL DX WITH NORMAL EF  . Fall   . Fracture of right olecranon process 10/23/2011  . Stones in the urinary tract   . Arthritis   . Stroke 10/12    Hx of remote stroke, RT SIDED WEAKNESS  . Non-ST elevated myocardial infarction (non-STEMI)   . MI (myocardial infarction) 08/2011    /H&P (05/12/2012)  . Sleep apnea   . Seizures 12/10/2011    HAD SEIZURE  . Skin cancer     left shoulder   Past Surgical History:  Past Surgical History  Procedure Date  . Transthoracic echocardiogram 06/26/2011    EF 55-60%  . Orif elbow fracture 12/29/2011    Procedure: OPEN REDUCTION INTERNAL FIXATION (ORIF) ELBOW/OLECRANON FRACTURE;  Surgeon: Eulas Post, MD;  Location: [facility] OR;  Service: Orthopedics;  Laterality: Right;  . Hardware removal 12/29/2011    Procedure: HARDWARE REMOVAL;  Surgeon: Eulas Post, MD;  Location: [facility] OR;  Service: Orthopedics;  Laterality: Right;  . Parotidectomy 05/12/2012    right  . Cardiac catheterization 06/26/2011    NORMAL LV SYSTOLIC FUNCTION. RCA IS LARGE AND DOMINANT  . Coronary angioplasty with stent placement ~ 2000    "one that I know of"  . Skin cancer excision 2013    left shoulder  . Parotidectomy 05/12/2012    Procedure: PAROTIDECTOMY;  Surgeon: Drema Halon, MD;  Location: [facility] OR;  Service: ENT;  Laterality: Right;  PAROTIDECTOMY MASS   HPI:  [name] is a 66 yo male admitted with acute renal failure from home. He requires total care at home per wife and she is unable to manage him at home any longer. Pt with sepsis, CAD, HTN, Parkinson's disease, FTT,  UTI, dehydration, falls, anemia, seizure disorder, cognitive deficits, gout, remote CVA with right hemiparesis, sleep apnea, and recent right superficial parotidectomy. Appetite at home is poor. Pt takes pills whole, but has trouble getting them down per wife.    Assessment / Plan / Recommendation Clinical Impression  Pt was able to consume and participate more than suggested by wife. He consumed crackers, over 580 ml water, and sherbet without incident. Pt cued to "speak loud" given PD and was able to improve speech intelligibility. Pt had difficulty transferring many small pills at once posteriorly when presented with water, however was able to clear with bite of ice cream followed by liquid wash. Recommend D3 (mech soft) with thin liquids, straw ok, alternate solids and liquids, feed pt slowly, ok for larger pills broken in half with water, and small pills taken together in ice cream followed by liquid wash. Ensure aspiration and reflux precautions and feed pt only when alert.     Aspiration Risk  Mild    Diet Recommendation Dysphagia 3 (Mechanical Soft);Thin liquid   Liquid Administration via: Cup;Straw Medication Administration: Whole meds with puree (or break large pills in half with liquid wash) Compensations: Small sips/bites;Check for pocketing;Slow rate;Follow solids with liquid Postural Changes and/or Swallow Maneuvers: Seated upright 90 degrees;Upright 30-60 min after meal    Other  Recommendations Oral Care Recommendations: Staff/trained caregiver to  provide oral care Other Recommendations: Clarify dietary restrictions   Follow Up Recommendations  Skilled Nursing facility    Frequency and Duration min 2x/week  1 week       SLP Swallow Goals Patient will consume recommended diet without observed clinical signs of aspiration with: Moderate assistance Patient will utilize recommended strategies during swallow to increase swallowing safety with: Moderate assistance   Swallow  Study Prior Functional Status   Total care per wife at home.    General Date of Onset: 06/20/12 HPI: [name] is a 66 yo male admitted with acute renal failure from home. He requires total care at home per wife and she is unable to manage him at home any longer. Pt with sepsis, CAD, HTN, Parkinson's disease, FTT, UTI, dehydration, falls, anemia, seizure disorder, cognitive deficits, gout, remote CVA with right hemiparesis, sleep apnea, and recent right superficial parotidectomy. Appetite at home is poor. Pt takes pills whole, but has trouble getting them down per wife.  Type of Study: Bedside swallow evaluation Previous Swallow Assessment: None on record Diet Prior to this Study: Dysphagia 2 (chopped);Thin liquids Temperature Spikes Noted: No Respiratory Status: Supplemental O2 delivered via (comment) History of Recent Intubation: No Behavior/Cognition: Alert;Cooperative;Pleasant mood;Requires cueing (flat affect, mask like facies) Oral Cavity - Dentition: Poor condition (needs aggressive oral care) Self-Feeding Abilities: Needs assist (can feed self finger foods with left arm) Patient Positioning: Upright in bed Baseline Vocal Quality: Clear;Low vocal intensity Volitional Cough: Congested Volitional Swallow: Unable to elicit    Oral/Motor/Sensory Function Overall Oral Motor/Sensory Function: Impaired Labial ROM: Reduced right;Reduced left Labial Symmetry: Within Functional Limits Labial Strength: Reduced Labial Sensation: Within Functional Limits Lingual ROM: Within Functional Limits Lingual Symmetry: Within Functional Limits Lingual Strength: Reduced Lingual Sensation: Within Functional Limits Facial ROM: Reduced right;Reduced left Facial Strength: Reduced Facial Sensation: Within Functional Limits Velum:  (could not visualize) Mandible: Within Functional Limits   Ice Chips Ice chips: Within functional limits Presentation: Spoon   Thin Liquid Thin Liquid: Within  functional limits Presentation: Cup;Straw    Nectar Thick Nectar Thick Liquid: Within functional limits (ice cream) Presentation: Spoon   Honey Thick Honey Thick Liquid: Not tested   Puree Puree: Not tested (no puree available, used sherbet)   Solid      Thank you,  Havery Moros, CCC-SLP [phone]   Solid: Impaired Presentation: Self Fed Oral Phase Impairments: Reduced lingual movement/coordination;Impaired anterior to posterior transit Oral Phase Functional Implications: Oral residue Other Comments: Benefits from liquid wash to clear oral residuals. Pt obseved taking all meds as well.        Zuzanna Maroney 06/20/2012,2:24 PM

## 2012-06-20 NOTE — Progress Notes (Signed)
Was notified this afternoon that the patient's blood pressures have decreased to the 70s systolically. He has already had received at least 3-4 L of normal saline. For this reason, I have ordered a PICC line for pressor support. Apparently, the nursing staff has tried to contact his wife on multiple occasions, unsuccessfully, to sign for consent for the PICC line. However, given the circumstances and the medical need for pressors, I will go ahead and consent myself for PICC line insertion. This is for medical necessity and for improving the patient's prognosis. I am aware that his renal function is compromised, but once again, the PICC line is needed for improvement of hypotension secondary to sepsis and hypovolemia.

## 2012-06-20 NOTE — Plan of Care (Signed)
Problem: Consults Goal: Skin Care Protocol Initiated - if indicated If consults are not indicated, leave blank or document N/A Outcome: Progressing Admitted with skin breakdown to bilat buttocks, excoriation of top layer of dermis and deep red blanchable skin Goal: Nutrition Consult-if indicated Outcome: Progressing Admitted with poor po intake over last 2 wks, patient acknowledged has not felt well  Problem: Phase I Progression Outcomes Goal: Pain controlled with appropriate interventions Outcome: Progressing Chronic back pain issues, non at present Goal: OOB as tolerated unless otherwise ordered Outcome: Not Progressing 10 year history with parkinsons.  Has remained chair ridden, Goal: Initial discharge plan identified Outcome: Progressing Discussion of nursing home placement by spouse who has been caring for him at home Goal: Hemodynamically stable Outcome: Not Progressing Patient remains hypotensive at present, dehydrated

## 2012-06-20 NOTE — Progress Notes (Signed)
Multiple attempts made in trying to contact pts wife at different numbers for PICC line consent. Unable to reach wife. PICC nurse spoke to Dr Sherrie Mustache. Order received to insert PICC line sue to pt declining status.

## 2012-06-20 NOTE — Progress Notes (Signed)
Subjective: The patient is lying in bed. He denies pain or shortness of breath.  Objective: Vital signs in last 24 hours: Filed Vitals:   06/20/12 0645 06/20/12 0700 06/20/12 0758 06/20/12 0800  BP: 112/48 95/44  138/66  Pulse: 100 91  102  Temp:   100.6 F (38.1 C)   TempSrc:   Oral   Resp: 17 15  26   Height:      Weight:      SpO2: 98% 90%  99%    Intake/Output Summary (Last 24 hours) at 06/20/12 0855 Last data filed at 06/20/12 0631  Gross per 24 hour  Intake 4781.67 ml  Output   1475 ml  Net 3306.67 ml    Weight change:   Physical exam: General: Obese 66 year old Caucasian man laying in bed, in no acute distress. Parkinsonian facies. Lungs: Clear anteriorly with decreased breath sounds in the bases. Heart: S1, S2, with borderline tachycardia. Abdomen: Firm, positive bowel sounds, nontender. GU: Indwelling Foley catheter draining yellow urine. Extremities: Trace of pedal edema bilaterally. Neurologic/psychiatric: He is alert. He is oriented to himself and hospital. He has a parkinsonian facies. He has a mild bilateral pinholing tremor. He has cogwheel rigidity of his arms. Cranial nerves II through XII are grossly intact.  Lab Results: Basic Metabolic Panel:  Basename 06/20/12 0414 06/19/12 1936  NA 139 133*  K 2.5* 2.2*  CL 103 93*  CO2 24 28  GLUCOSE 121* 114*  BUN 45* 52*  CREATININE 2.75* 3.09*  CALCIUM 7.9* 9.1  MG 2.0 --  PHOS -- --   Liver Function Tests:  [facility] 06/20/12 0414 06/19/12 1936  AST 21 20  ALT 12 9  ALKPHOS 44 54  BILITOT 0.4 0.4  PROT 5.6* 7.1  ALBUMIN 2.4* 3.2*   No results found for this basename: LIPASE:2,AMYLASE:2 in the last 72 hours No results found for this basename: AMMONIA:2 in the last 72 hours CBC:  Basename 06/20/12 0414 06/19/12 1936  WBC 15.3* 14.4*  NEUTROABS -- 11.9*  HGB 9.5* 10.5*  HCT 27.1* 29.8*  MCV 87.4 86.9  PLT 242 276   Cardiac Enzymes: No results found for this basename:  CKTOTAL:3,CKMB:3,CKMBINDEX:3,TROPONINI:3 in the last 72 hours BNP: No results found for this basename: PROBNP:3 in the last 72 hours D-Dimer: No results found for this basename: DDIMER:2 in the last 72 hours CBG: No results found for this basename: GLUCAP:6 in the last 72 hours Hemoglobin A1C: No results found for this basename: HGBA1C in the last 72 hours Fasting Lipid Panel: No results found for this basename: CHOL,HDL,LDLCALC,TRIG,CHOLHDL,LDLDIRECT in the last 72 hours Thyroid Function Tests: No results found for this basename: TSH,T4TOTAL,FREET4,T3FREE,THYROIDAB in the last 72 hours Anemia Panel:  Basename 06/20/12 0414  VITAMINB12 --  FOLATE --  FERRITIN --  TIBC --  IRON --  RETICCTPCT 1.0   Coagulation:  Basename 06/20/12 0414  LABPROT 15.2  INR 1.22   Urine Drug Screen: Drugs of Abuse     Component Value Date/Time   LABOPIA NONE DETECTED 10/12/2008 1146   COCAINSCRNUR NONE DETECTED 10/12/2008 1146   LABBENZ NONE DETECTED 10/12/2008 1146   AMPHETMU NONE DETECTED 10/12/2008 1146   THCU NONE DETECTED 10/12/2008 1146   LABBARB  Value: NONE DETECTED        DRUG SCREEN FOR MEDICAL PURPOSES ONLY.  IF CONFIRMATION IS NEEDED FOR ANY PURPOSE, NOTIFY LAB WITHIN 5 DAYS.        LOWEST DETECTABLE LIMITS FOR URINE DRUG SCREEN Drug Class  Cutoff (ng/mL) Amphetamine      1000 Barbiturate      200 Benzodiazepine   200 Tricyclics       300 Opiates          300 Cocaine          300 THC              50 10/12/2008 1146    Alcohol Level: No results found for this basename: ETH:2 in the last 72 hours Urinalysis:  Basename 06/19/12 1916  COLORURINE YELLOW  LABSPEC 1.020  PHURINE 6.0  GLUCOSEU NEGATIVE  HGBUR LARGE*  BILIRUBINUR NEGATIVE  KETONESUR NEGATIVE  PROTEINUR 100*  UROBILINOGEN 0.2  NITRITE POSITIVE*  LEUKOCYTESUR MODERATE*   Misc. Labs:   Micro: Recent Results (from the past 240 hour(s))  CULTURE, BLOOD (ROUTINE X 2)     Status: Normal (Preliminary result)   Collection  Time   06/19/12  7:36 PM      Component Value Range Status Comment   Specimen Description BLOOD BLOOD RIGHT ARM   Final    Special Requests BOTTLES DRAWN AEROBIC AND ANAEROBIC 8CC   Final    Culture PENDING   Incomplete    Report Status PENDING   Incomplete   CULTURE, BLOOD (ROUTINE X 2)     Status: Normal (Preliminary result)   Collection Time   06/20/12 12:16 AM      Component Value Range Status Comment   Specimen Description BLOOD   Final    Special Requests     Final    Value: LEFT ANTECUBITAL BOTTLES DRAWN AEROBIC AND ANAEROBIC 8CC   Culture PENDING   Incomplete    Report Status PENDING   Incomplete   MRSA PCR SCREENING     Status: Normal   Collection Time   06/20/12  3:00 AM      Component Value Range Status Comment   MRSA by PCR NEGATIVE  NEGATIVE Final     Studies/Results: Ct Head Wo Contrast  06/19/2012  *RADIOLOGY REPORT*  Clinical Data: Fall with head injury.  History of hypertension, Parkinson's disease, and stroke.  History of seizures.  Recent right parotidectomy.  CT HEAD WITHOUT CONTRAST  Technique:  Contiguous axial images were obtained from the base of the skull through the vertex without contrast.  Comparison: 08/08/2011  Findings: Mild cerebral atrophy.  Patchy low attenuation changes in the deep white matter consistent with small vessel ischemia. Asymmetric low attenuation in the inferior left frontal lobe is probably due to old infarct and stable since previous study.  No significant ventricular dilatation.  No mass effect or midline shift.  No abnormal extra-axial fluid collections.  Gray-white matter junctions are distinct.  Basal cisterns are not effaced.  No evidence of acute intracranial hemorrhage.  No depressed skull fractures.  Visualized paranasal sinuses and mastoid air cells are not opacified.  Stable appearance since previous study.  IMPRESSION: No acute intracranial abnormalities.  Mild chronic atrophy and small vessel ischemic changes.   Original Report  Authenticated By: Marlon Pel, M.D.    Dg Chest Port 1 View  06/19/2012  *RADIOLOGY REPORT*  Clinical Data: Fever.  Pneumonia.  PORTABLE CHEST - 1 VIEW  Comparison: 03/04/2012.  Findings: Low volume chest.  Basilar atelectasis.  No focal consolidation on the single frontal view.  Cardiopericardial silhouette appears similar to prior exam, within normal limits allowing for volumes of inspiration. Monitoring leads are projected over the chest.  IMPRESSION: No acute cardiopulmonary disease.  Low volume chest.  Original Report Authenticated By: Andreas Newport, M.D.     Medications:  Scheduled:    . acetaminophen  650 mg Rectal Once  . antiseptic oral rinse  15 mL Mouth Rinse BID  . aspirin EC  81 mg Oral Daily  . atorvastatin  40 mg Oral q1800  . carbidopa-levodopa  1 tablet Oral TID  . cefTRIAXone (ROCEPHIN)  IV  1 g Intravenous Once  . cefTRIAXone (ROCEPHIN)  IV  1 g Intravenous Q24H  . clopidogrel  75 mg Oral Daily  . docusate sodium  100 mg Oral BID  . levETIRAcetam  250 mg Oral BID  . multivitamin with minerals  1 tablet Oral Daily  . potassium chloride  10 mEq Intravenous Q1 Hr x 4  . potassium chloride  40 mEq Oral Once  . sodium chloride  1,000 mL Intravenous Once  . sodium chloride  1,000 mL Intravenous Once  . sodium chloride  500 mL Intravenous Once  . sodium chloride  500 mL Intravenous Once  . sodium chloride  500 mL Intravenous Once  . thiamine  100 mg Oral Daily  . DISCONTD: potassium chloride  10 mEq Intravenous Q1 Hr x 5  . DISCONTD: potassium chloride  10 mEq Intravenous Q1 Hr x 4   Continuous:    . sodium chloride 100 mL/hr at 06/20/12 0631  . DISCONTD: sodium chloride Stopped (06/19/12 2116)   NWG:[id number], acetaminophen, albuterol, ondansetron (ZOFRAN) IV, ondansetron, oxyCODONE  Assessment: Principal Problem:  *ARF (acute renal failure) Active Problems:  Sepsis  CAD (coronary artery disease)  HTN (hypertension)  Parkinson disease  FTT  (failure to thrive) in adult  UTI (lower urinary tract infection)  Dehydration  Frequent falls  Hypokalemia  Anemia  Seizure disorder    1. Hypotension/fever/tachycardia/urinary tract infection. He meets criteria for sepsis, likely secondary to the urinary tract infection, although his lactic acid and procalcitonin are within normal limits. His blood pressure is improving on IV fluid hydration.  Urinary tract infection. We'll continue ceftriaxone. We'll add vancomycin empirically. Urine culture is pending. Blood cultures are pending.  Hypovolemia/dehydration. He is being repleted with IV fluids.  Acute renal failure, likely secondary to hypovolemia/prerenal azotemia.  Severe hypokalemia. His magnesium level is within normal limits. Hypokalemia may be secondary to previous Lasix therapy. He is getting intravenous potassium chloride runs.  Anemia. Etiology unknown. Anemia studies are pending.  Failure to thrive/frequent falls. This is likely secondary to the infection and advanced Parkinson's disease. CT of his head negative for acute intracranial abnormalities. His wife desires skilled nursing facility placement for rehabilitation.  Parkinson's disease. He is followed chronically by Dr. Gerilyn Pilgrim. He is treated chronically with Sinemet. This will be continued.  Seizure disorder. He is treated chronically with Keppra. We'll continue this.    Plan:  1. Continue IV fluid hydration. We'll add potassium chloride to the IV fluids. 2. Continue oral potassium chloride supplementation. Continue to monitor his potassium level daily. Will check another potassium level at 4:00 today. 3. We'll add vancomycin empirically. 4. We'll check the anemia panel results when available. We'll check the results of the TSH pending. 5. Will followup on the recommendations by the speech therapist per swallow evaluation ordered.   Total ICU/critical care time: 45 minutes.   LOS: 1 day    Karema Tocci 06/20/2012, 8:55 AM

## 2012-06-20 NOTE — Progress Notes (Signed)
Dr. Sherrie Mustache informed about blood pressure being in the 70's over 30's. Dr. Sherrie Mustache into see patient.  Patient is alert and talking and will follow commands. Manual blood pressure obtained. Dr. Sherrie Mustache stated she would order for PICC line.

## 2012-06-20 NOTE — Clinical Social Work Psychosocial (Signed)
    Clinical Social Work Department BRIEF PSYCHOSOCIAL ASSESSMENT 06/20/2012  Patient:  Mark [name], Mark [name]     Account Number:  [id number]     Admit date:  06/19/2012  Clinical Social Worker:  Santa Genera, CLINICAL SOCIAL WORKER  Date/Time:  06/20/2012 10:30 AM  Referred by:  Physician  Date Referred:  06/20/2012 Referred for  SNF Placement   Other Referral:   Interview type:  Family Other interview type:    PSYCHOSOCIAL DATA Living Status:  FAMILY Admitted from facility:   Level of care:   Primary support name:  Mark [name] Primary support relationship to patient:  SPOUSE Degree of support available:   Spouse and daughter provide extensive care to patient, are experiencing significant caregiver fatigue    CURRENT CONCERNS Current Concerns  Post-Acute Placement   Other Concerns:    SOCIAL WORK ASSESSMENT / PLAN CSW met w patient and wife at bedside.  Patient mostly asleep, wife states that he cannot speak well but can hear. Patient has Parkinson's disease, for past 1.5 years has required "help w everything, getting up, walking, everything" per wife.  Wife is exhausted, works full time job during day and returns at night to care for husband. Husband primarily sleeps during day and gets up more at night.  Husband sits in lift chair 24/7, including for sleep, wife says he fell out of bed w rails frequently. Husband operates lift chair to standing position and then tries to walk, often resulting in falls.    Patient married wife in 1992, patient has daughter who helps w care but also works full time job.    Wife wants placement for husband, wondered why Medicare would not pay for care patient needs at this point due to his disability.  PT attempted to evaluate patient, but he was not able to participate.  Per PT's conversation w wife, patient has attempted rehab in past but without much success.  Wife is very clear that she cannot manage to care for patient at home any longer and  wants placement.    CSW discussed various levels of care available, and costs involved.  Advised that insurance would not cover SNF since patient was not able to participate in rehab or had a medical need for SNF placement.  Wife concerned that her assets would be tapped as well as patient's in order to pay for placement, but insisted that they did not "commingle" their funds.  Wife advised to discuss their situation w DSS for possible Medicaid, and will be given list of sitter agencies for private duty help.  CSW will also fax patient out to ALF and [facility] to see if any can assist despite patient condition.  CSW will keep family advised of progress.   Assessment/plan status:  Psychosocial Support/Ongoing Assessment of Needs Other assessment/ plan:   Information/referral to community resources:   [facility] and SNF list for [facility]    PATIENT'S/FAMILY'S RESPONSE TO PLAN OF CARE: Appreciative    Santa Genera, LCSW Clinical Social Worker [phone])

## 2012-06-20 NOTE — Clinical Social Work Placement (Signed)
    Clinical Social Work Department CLINICAL SOCIAL WORK PLACEMENT NOTE 06/27/2012  Patient:  Mark [name], Mark [name]  Account Number:  [id number] Admit date:  06/19/2012  Clinical Social Worker:  Santa Genera, CLINICAL SOCIAL WORKER  Date/time:  06/20/2012 11:00 AM  Clinical Social Work is seeking post-discharge placement for this patient at the following level of care:   SKILLED NURSING   (*CSW will update this form in Epic as items are completed)   06/20/2012  Patient/family provided with [facility] Department of Clinical Social Work's list of facilities offering this level of care within the geographic area requested by the patient (or if unable, by the patient's family).  06/20/2012  Patient/family informed of their freedom to choose among providers that offer the needed level of care, that participate in Medicare, Medicaid or managed care program needed by the patient, have an available bed and are willing to accept the patient.  06/20/2012  Patient/family informed of [facility]' ownership interest in [facility], as well as of the fact that they are under no obligation to receive care at this facility.  PASARR submitted to EDS on 06/20/2012 PASARR number received from EDS on 06/20/2012  FL2 transmitted to all facilities in geographic area requested by pt/family on  06/20/2012 FL2 transmitted to all facilities within larger geographic area on   Patient informed that his/her managed care company has contracts with or will negotiate with  certain facilities, including the following:     Patient/family informed of bed offers received:  06/23/2012 Patient chooses bed at [facility] [place] Physician recommends and patient chooses bed at    Patient to be transferred to [facility] [place] on  06/25/2012 Patient to be transferred to facility by [facility]  The following physician request were entered in Epic:   Additional Comments: Patient has also been  assessed by ALFs, and offered bed at [facility] if he meets that level at discharge.  Santa Genera, LCSW Clinical Social Worker [phone])

## 2012-06-20 NOTE — Progress Notes (Signed)
INITIAL ADULT NUTRITION ASSESSMENT Date: 06/20/2012   Time: 11:56 AM Reason for Assessment: Low Braden  ASSESSMENT: Male 66 y.o.  Dx: ARF (acute renal failure)   Past Medical History  Diagnosis Date  . Hypertension   . PAF (paroxysmal atrial fibrillation)   . Parkinson's disease   . Gout   . Coronary artery disease     SEVERE 3-VESSEL DX WITH NORMAL EF  . Fall   . Fracture of right olecranon process 10/23/2011  . Stones in the urinary tract   . Arthritis   . Stroke 10/12    Hx of remote stroke, RT SIDED WEAKNESS  . Non-ST elevated myocardial infarction (non-STEMI)   . MI (myocardial infarction) 08/2011    /H&P (05/12/2012)  . Sleep apnea   . Seizures 12/10/2011    HAD SEIZURE  . Skin cancer     left shoulder    Scheduled Meds:   . acetaminophen  650 mg Rectal Once  . antiseptic oral rinse  15 mL Mouth Rinse BID  . aspirin EC  81 mg Oral Daily  . atorvastatin  40 mg Oral q1800  . carbidopa-levodopa  1 tablet Oral TID  . cefTRIAXone (ROCEPHIN)  IV  1 g Intravenous Once  . cefTRIAXone (ROCEPHIN)  IV  1 g Intravenous Q24H  . clopidogrel  75 mg Oral Daily  . docusate sodium  100 mg Oral BID  . levETIRAcetam  250 mg Oral BID  . multivitamin with minerals  1 tablet Oral Daily  . potassium chloride  10 mEq Intravenous Q1 Hr x 4  . potassium chloride  40 mEq Oral Once  . potassium chloride  40 mEq Oral Daily  . sodium chloride  1,000 mL Intravenous Once  . sodium chloride  1,000 mL Intravenous Once  . sodium chloride  500 mL Intravenous Once  . sodium chloride  500 mL Intravenous Once  . sodium chloride  500 mL Intravenous Once  . thiamine  100 mg Oral Daily  . vancomycin  1,500 mg Intravenous Once  . vancomycin  1,500 mg Intravenous Q24H  . DISCONTD: potassium chloride  10 mEq Intravenous Q1 Hr x 5  . DISCONTD: potassium chloride  10 mEq Intravenous Q1 Hr x 4  . DISCONTD: potassium chloride  40 mEq Oral BID   Continuous Infusions:   . 0.9 % NaCl with KCl 40 mEq / L  100 mL/hr at 06/20/12 1101  . DISCONTD: sodium chloride Stopped (06/19/12 2116)  . DISCONTD: sodium chloride 100 mL/hr at 06/20/12 0631   PRN Meds:.acetaminophen, acetaminophen, albuterol, ondansetron (ZOFRAN) IV, ondansetron, oxyCODONE  Ht: 6' (182.9 cm)  Wt: 231 lb 0.7 oz (104.8 kg)  Ideal Wt:  77.6 kg % Ideal Wt: 135%  Usual Wt:  Wt Readings from Last 10 Encounters:  06/20/12 231 lb 0.7 oz (104.8 kg)  05/13/12 233 lb 7.5 oz (105.9 kg)  05/13/12 233 lb 7.5 oz (105.9 kg)  04/29/12 224 lb 8 oz (101.833 kg)  04/05/12 232 lb (105.235 kg)  03/04/12 225 lb (102.059 kg)  12/29/11 225 lb (102.059 kg)  12/29/11 225 lb (102.059 kg)  12/28/11 224 lb 13.9 oz (102 kg)  12/19/11 225 lb (102.059 kg)     Body mass index is 31.33 kg/(m^2). Obesity Class 1  Food/Nutrition Related Hx: Pt unable to provide hx. Spouse at bedside. His appetite has been poor <50% of usual intake for past 14 days. Wt remains stable compared to September wt and actually has increased ~7#, 3% since April  which is not significant. Wt loss may be masked by increased fluid? Pt has been on Lasix recently per spouse. His meal pattern is variable and inconsistent. He does not like Ensure but possible [employer]. Limited ability to feed himself. His Braden Score=(10) High risk for skin breakdown. In fact pt already has Stage II on buttock. Based on diet hx review his oral intake is inadequate to meet estimated nutrition needs. I suspect this gentleman is malnourished but unable to diagnose at this time.    CMP     Component Value Date/Time   NA 139 06/20/2012 0414   K 2.5* 06/20/2012 0414   CL 103 06/20/2012 0414   CO2 24 06/20/2012 0414   GLUCOSE 121* 06/20/2012 0414   BUN 45* 06/20/2012 0414   CREATININE 2.75* 06/20/2012 0414   CALCIUM 7.9* 06/20/2012 0414   PROT 5.6* 06/20/2012 0414   ALBUMIN 2.4* 06/20/2012 0414   AST 21 06/20/2012 0414   ALT 12 06/20/2012 0414   ALKPHOS 44 06/20/2012 0414   BILITOT 0.4  06/20/2012 0414   GFRNONAA 22* 06/20/2012 0414   GFRAA 26* 06/20/2012 0414   I/O last 3 completed shifts: In: 4781.7 [P.O.:180; I.V.:3401.7; IV Piggyback:1200] Out: 1475 [Urine:1475]   Diet Order: Dysphagia 2/thin (ST eval results pending)  Supplements:MVI, thiamine  IVF:    0.9 % NaCl with KCl 40 mEq / L Last Rate: 100 mL/hr at 06/20/12 1101  DISCONTD: sodium chloride Last Rate: Stopped (06/19/12 2116)  DISCONTD: sodium chloride Last Rate: 100 mL/hr at 06/20/12 0631    Estimated Nutritional Needs:   Kcal: 2200-2400 kcal Protein:105-126 gr Fluid:1 ml/kcal  NUTRITION DIAGNOSIS: -Inadequate oral intake (NI-2.1).  Status: Ongoing  RELATED TO: infection, decreased appetite  AS EVIDENCE BY: diet hx review and UTI  MONITORING/EVALUATION(Goals): Monitor pt po intake food and fluids, results of ST eval, skin assessments Goal: Pt to meet >/= 90% of their estimated nutrition needs; not met   EDUCATION NEEDS: -No education needs identified at this time  INTERVENTION: -Add Resource Breeze BID -Add ProStat 30 ml BID  Dietitian [phone]  DOCUMENTATION CODES Per approved criteria  -Obesity Class 1    Francene Boyers 06/20/2012, 11:56 AM

## 2012-06-20 NOTE — Progress Notes (Signed)
PT Cancellation Note  Patient Details Name: Mark [name] MRN: [id number] DOB: 12-29-1945   Cancelled Treatment:   Pt Reason Eval/Treat Not Completed: Medical issues which prohibited therapy   Konrad Penta 06/20/2012, 9:48 AM

## 2012-06-20 NOTE — Progress Notes (Signed)
Mark [name] has been communicative and cooperative.  He passed a bedside swallow. Proceeded to take 2 PO medications and wanted the entire ensure pudding, which he ate.  Not a cough, unless encouraged.  Lung sounds clear bilaterally, diminished on Right.   Report of urine cream colored in ED, then cleared to pink tinged is now clear yellow urine.  Monitoring of  blood pressures closely for possible additional boluses.

## 2012-06-20 NOTE — Progress Notes (Signed)
Dopamine started which effective in increasing blood pressure however HR went up into the 120's. Dr. Sherrie Mustache informed stated as long as his HR is not in the 130's constituently then it will be ok. Goal is to maintain SBP above 90 and map of 65 or greater.

## 2012-06-20 NOTE — Progress Notes (Signed)
Occupational Therapy Screen   OT orders received. Patient's chart reviewed. Spoke with PT who states that patient is not a rehab candidate. Patient requires a Total Assist for care prior to admit to hospital. SW is currently looking for SNF placement for patient. OT services not needed at this time; will sign off.  Limmie Patricia, OTR/L 06/20/12 1:14PM

## 2012-06-20 NOTE — Progress Notes (Signed)
Dr Sherrie Mustache paged and made aware of positive blood cultures.

## 2012-06-20 NOTE — Progress Notes (Signed)
UR Chart Review Completed  

## 2012-06-21 ENCOUNTER — Encounter (HOSPITAL_COMMUNITY): Payer: Self-pay | Admitting: Internal Medicine

## 2012-06-21 DIAGNOSIS — I959 Hypotension, unspecified: Secondary | ICD-10-CM | POA: Diagnosis present

## 2012-06-21 DIAGNOSIS — A498 Other bacterial infections of unspecified site: Secondary | ICD-10-CM

## 2012-06-21 DIAGNOSIS — B9620 Unspecified Escherichia coli [E. coli] as the cause of diseases classified elsewhere: Secondary | ICD-10-CM | POA: Diagnosis present

## 2012-06-21 DIAGNOSIS — R7881 Bacteremia: Secondary | ICD-10-CM

## 2012-06-21 HISTORY — DX: Bacteremia: R78.81

## 2012-06-21 HISTORY — DX: Unspecified Escherichia coli (E. coli) as the cause of diseases classified elsewhere: B96.20

## 2012-06-21 LAB — BASIC METABOLIC PANEL
CO2: 23 mEq/L (ref 19–32)
Calcium: 8.4 mg/dL (ref 8.4–10.5)
Chloride: 106 mEq/L (ref 96–112)
Creatinine, Ser: 2.39 mg/dL — ABNORMAL HIGH (ref 0.50–1.35)
GFR calc Af Amer: 31 mL/min — ABNORMAL LOW (ref >90)
Sodium: 139 mEq/L (ref 135–145)

## 2012-06-21 MED ORDER — TUBERCULIN PPD 5 UNIT/0.1ML ID SOLN
5.0000 [IU] | Freq: Once | INTRADERMAL | Status: AC
  Administered 2012-06-21: 5 [IU] via INTRADERMAL
  Filled 2012-06-21: qty 0.1

## 2012-06-21 MED ORDER — POTASSIUM CHLORIDE CRYS ER 20 MEQ PO TBCR
40.0000 meq | EXTENDED_RELEASE_TABLET | Freq: Two times a day (BID) | ORAL | Status: DC
  Administered 2012-06-21 – 2012-06-22 (×4): 40 meq via ORAL
  Filled 2012-06-21 (×4): qty 2

## 2012-06-21 NOTE — Clinical Social Work Note (Signed)
Wife wants out of home placement for patient, w insurance paying majority of cost.  Wife given information on community resources, including [facility] ([facility]), [facility], [facility].  Wife commented that none of these resources were useful to her because they would not pay for out of home care.  Wife is adamant that she cannot continue to manage patient at home.  [facility] has previously evaluated patient and are not able to assist.  Patient information faxed to [facility], who will contact insurance company for possible authorization.  Wife informed of estimated monthly cost of [facility] SNF placement.  Wife encouraged to gather patient financial information and investigate Medicaid application.  Given departmental handout on Medicaid.  Told wife 2 ALFs are considering patient, but need to do face to face evaluation in order to determine if facilities can meet patient's needs.  Santa Genera, LCSW Clinical Social Worker [phone])

## 2012-06-21 NOTE — Progress Notes (Signed)
Subjective: The patient is sitting up in bed getting ready to eat breakfast. He has chronic back pain, otherwise no complaints. Events noted yesterday for persistent hypotension which necessitated a PICC line and start of dopamine.  Objective: Vital signs in last 24 hours: Filed Vitals:   06/21/12 0545 06/21/12 0600 06/21/12 0740 06/21/12 0800  BP: 112/54 107/55 119/58   Pulse: 99 96 93   Temp:    98.9 F (37.2 C)  TempSrc:    Oral  Resp: 18 14 21    Height:      Weight:      SpO2: 94% 94% 94%     Intake/Output Summary (Last 24 hours) at 06/21/12 0826 Last data filed at 06/21/12 0600  Gross per 24 hour  Intake 3001.76 ml  Output   2475 ml  Net 526.76 ml    Weight change: 3.9 kg (8 lb 9.6 oz)  Physical exam: General: Obese 66 year old Caucasian man sitting up in bed, in no acute distress. Parkinsonian facies. Lungs: Clear anteriorly with decreased breath sounds in the bases. Heart: S1, S2, with borderline tachycardia. Abdomen: Firm, positive bowel sounds, nontender. GU: Indwelling Foley catheter draining yellow urine. Extremities: Trace of pedal edema bilaterally. Neurologic/psychiatric: He is alert. He is oriented to himself and hospital. He has a parkinsonian facies. He has a mild bilateral pinholing tremor. He has cogwheel rigidity of his arms. Cranial nerves II through XII are grossly intact.  Lab Results: Basic Metabolic Panel:  Basename 06/21/12 0417 06/20/12 1538 06/20/12 0414  NA 139 136 --  K 3.1* 3.4* --  CL 106 103 --  CO2 23 25 --  GLUCOSE 111* 119* --  BUN 34* 39* --  CREATININE 2.39* 2.56* --  CALCIUM 8.4 8.1* --  MG -- -- 2.0  PHOS -- -- --   Liver Function Tests:  Basename 06/20/12 0414 06/19/12 1936  AST 21 20  ALT 12 9  ALKPHOS 44 54  BILITOT 0.4 0.4  PROT 5.6* 7.1  ALBUMIN 2.4* 3.2*   No results found for this basename: LIPASE:2,AMYLASE:2 in the last 72 hours No results found for this basename: AMMONIA:2 in the last 72  hours CBC:  Basename 06/20/12 0414 06/19/12 1936  WBC 15.3* 14.4*  NEUTROABS -- 11.9*  HGB 9.5* 10.5*  HCT 27.1* 29.8*  MCV 87.4 86.9  PLT 242 276   Cardiac Enzymes: No results found for this basename: CKTOTAL:3,CKMB:3,CKMBINDEX:3,TROPONINI:3 in the last 72 hours BNP: No results found for this basename: PROBNP:3 in the last 72 hours D-Dimer: No results found for this basename: DDIMER:2 in the last 72 hours CBG: No results found for this basename: GLUCAP:6 in the last 72 hours Hemoglobin A1C: No results found for this basename: HGBA1C in the last 72 hours Fasting Lipid Panel: No results found for this basename: CHOL,HDL,LDLCALC,TRIG,CHOLHDL,LDLDIRECT in the last 72 hours Thyroid Function Tests:  Basename 06/20/12 0414  TSH 0.528  T4TOTAL --  FREET4 --  T3FREE --  THYROIDAB --   Anemia Panel:  Basename 06/20/12 0414  VITAMINB12 898  FOLATE 7.8  FERRITIN 328*  TIBC 165*  IRON 15*  RETICCTPCT 1.0   Coagulation:  Basename 06/20/12 0414  LABPROT 15.2  INR 1.22   Urine Drug Screen: Drugs of Abuse     Component Value Date/Time   LABOPIA NONE DETECTED 10/12/2008 1146   COCAINSCRNUR NONE DETECTED 10/12/2008 1146   LABBENZ NONE DETECTED 10/12/2008 1146   AMPHETMU NONE DETECTED 10/12/2008 1146   THCU NONE DETECTED 10/12/2008 1146   LABBARB  Value: NONE DETECTED        DRUG SCREEN FOR MEDICAL PURPOSES ONLY.  IF CONFIRMATION IS NEEDED FOR ANY PURPOSE, NOTIFY LAB WITHIN 5 DAYS.        LOWEST DETECTABLE LIMITS FOR URINE DRUG SCREEN Drug Class       Cutoff (ng/mL) Amphetamine      1000 Barbiturate      200 Benzodiazepine   200 Tricyclics       300 Opiates          300 Cocaine          300 THC              50 10/12/2008 1146    Alcohol Level: No results found for this basename: ETH:2 in the last 72 hours Urinalysis:  Basename 06/19/12 1916  COLORURINE YELLOW  LABSPEC 1.020  PHURINE 6.0  GLUCOSEU NEGATIVE  HGBUR LARGE*  BILIRUBINUR NEGATIVE  KETONESUR NEGATIVE  PROTEINUR 100*   UROBILINOGEN 0.2  NITRITE POSITIVE*  LEUKOCYTESUR MODERATE*   Misc. Labs:   Micro: Recent Results (from the past 240 hour(s))  URINE CULTURE     Status: Normal (Preliminary result)   Collection Time   06/19/12  7:18 PM      Component Value Range Status Comment   Specimen Description URINE, CATHETERIZED   Final    Special Requests NONE   Final    Culture  Setup Time 06/20/2012 01:22   Final    Colony Count >=100,000 COLONIES/ML   Final    Culture ESCHERICHIA COLI   Final    Report Status PENDING   Incomplete   CULTURE, BLOOD (ROUTINE X 2)     Status: Normal (Preliminary result)   Collection Time   06/19/12  7:36 PM      Component Value Range Status Comment   Specimen Description BLOOD RIGHT ARM   Final    Special Requests BOTTLES DRAWN AEROBIC AND ANAEROBIC 8CC   Final    Culture     Final    Value: GRAM NEGATIVE RODS     Gram Stain Report Called to,Read Back By and Verified With: PREVIOUSLY TO KOGER,L. AT 1358 ON 06/20/2012 BY BAUGHAM,M.     Performed at [facility]   Report Status PENDING   Incomplete   CULTURE, BLOOD (ROUTINE X 2)     Status: Normal (Preliminary result)   Collection Time   06/20/12 12:16 AM      Component Value Range Status Comment   Specimen Description BLOOD LEFT ANTECUBITAL   Final    Special Requests BOTTLES DRAWN AEROBIC AND ANAEROBIC 8CC   Final    Culture  Setup Time 06/20/2012 13:40   Final    Culture     Final    Value: GRAM NEGATIVE RODS     1358 Note: Gram Stain Report Called to,Read Back By and Verified With: KOGER L 06/20/2012 BAUGHAM   Report Status PENDING   Incomplete   MRSA PCR SCREENING     Status: Normal   Collection Time   06/20/12  3:00 AM      Component Value Range Status Comment   MRSA by PCR NEGATIVE  NEGATIVE Final     Studies/Results: Ct Head Wo Contrast  06/19/2012  *RADIOLOGY REPORT*  Clinical Data: Fall with head injury.  History of hypertension, Parkinson's disease, and stroke.  History of seizures.  Recent  right parotidectomy.  CT HEAD WITHOUT CONTRAST  Technique:  Contiguous axial images were obtained from the base  of the skull through the vertex without contrast.  Comparison: 08/08/2011  Findings: Mild cerebral atrophy.  Patchy low attenuation changes in the deep white matter consistent with small vessel ischemia. Asymmetric low attenuation in the inferior left frontal lobe is probably due to old infarct and stable since previous study.  No significant ventricular dilatation.  No mass effect or midline shift.  No abnormal extra-axial fluid collections.  Gray-white matter junctions are distinct.  Basal cisterns are not effaced.  No evidence of acute intracranial hemorrhage.  No depressed skull fractures.  Visualized paranasal sinuses and mastoid air cells are not opacified.  Stable appearance since previous study.  IMPRESSION: No acute intracranial abnormalities.  Mild chronic atrophy and small vessel ischemic changes.   Original Report Authenticated By: Marlon Pel, M.D.    Dg Chest Port 1 View  06/20/2012  *RADIOLOGY REPORT*  Clinical Data:  PORTABLE CHEST - 1 VIEW  Comparison: Portable exam 1514 hours compared to 06/19/2012  Findings: Left arm PICC line, tip projecting over SVC near cavoatrial junction. Upper normal heart size. Calcified tortuous aorta. Pulmonary vascularity normal. Minimal right basilar atelectasis. Lungs otherwise clear. No pleural effusion or pneumothorax.  IMPRESSION: Tip of left arm PICC line projects over the SVC near the cavoatrial junction. Minimal right basilar atelectasis.   Original Report Authenticated By: Lollie Marrow, M.D.    Dg Chest Port 1 View  06/19/2012  *RADIOLOGY REPORT*  Clinical Data: Fever.  Pneumonia.  PORTABLE CHEST - 1 VIEW  Comparison: 03/04/2012.  Findings: Low volume chest.  Basilar atelectasis.  No focal consolidation on the single frontal view.  Cardiopericardial silhouette appears similar to prior exam, within normal limits allowing for volumes of  inspiration. Monitoring leads are projected over the chest.  IMPRESSION: No acute cardiopulmonary disease.  Low volume chest.   Original Report Authenticated By: Andreas Newport, M.D.     Medications:  Scheduled:    . antiseptic oral rinse  15 mL Mouth Rinse BID  . aspirin EC  81 mg Oral Daily  . atorvastatin  40 mg Oral q1800  . carbidopa-levodopa  1 tablet Oral TID  . clopidogrel  75 mg Oral Daily  . docusate sodium  100 mg Oral BID  . feeding supplement  30 mL Oral BID BM  . feeding supplement  1 Container Oral BID BM  . influenza  inactive virus vaccine  0.5 mL Intramuscular Tomorrow-1000  . levETIRAcetam  250 mg Oral BID  . multivitamin with minerals  1 tablet Oral Daily  . piperacillin-tazobactam (ZOSYN)  IV  3.375 g Intravenous Q8H  . potassium chloride  10 mEq Intravenous Q1 Hr x 4  . potassium chloride  40 mEq Oral Daily  . sodium chloride  10-40 mL Intracatheter Q12H  . thiamine  100 mg Oral Daily  . vancomycin  1,500 mg Intravenous Once  . vancomycin  1,500 mg Intravenous Q24H  . DISCONTD: cefTRIAXone (ROCEPHIN)  IV  1 g Intravenous Q24H  . DISCONTD: potassium chloride  40 mEq Oral BID   Continuous:    . 0.9 % NaCl with KCl 40 mEq / L 100 mL/hr at 06/21/12 0600  . DOPamine Stopped (06/21/12 0749)  . DISCONTD: sodium chloride 100 mL/hr at 06/20/12 0631   ZOX:[id number], acetaminophen, albuterol, ondansetron (ZOFRAN) IV, ondansetron, oxyCODONE, sodium chloride  Assessment: Principal Problem:  *ARF (acute renal failure) Active Problems:  Sepsis  Hypotension  Bacteremia due to Escherichia coli  CAD (coronary artery disease)  HTN (hypertension)  Parkinson disease  FTT (failure to thrive) in adult  UTI (lower urinary tract infection)  Dehydration  Frequent falls  Hypokalemia  Anemia  Seizure disorder    1. Hypotension/fever/tachycardia/urinary tract infection/gram-negative bacteremia. He meets criteria for sepsis, likely secondary to the urinary tract  infection. His urine culture is growing out Escherichia coli. His blood cultures are growing gram-negative rods, likely Escherichia coli. His blood pressure decreased yesterday after receiving multiple liters of IV fluids. Decision was made to start dopamine. His blood pressure is in the lower 100s systolically on 3 mcg of dopamine. We'll titrate off as tolerated.  Urinary tract infection secondary to Escherichia coli. Ceftriaxone was discontinued in favor of broader spectrum antibiotic treatment with Zosyn. Vancomycin was added as well.  Hypovolemia/dehydration. He is being repleted with IV fluids.  Acute renal failure, likely secondary to hypovolemia/prerenal azotemia/hypoperfusion. His renal function is improving slowly. His creatinine was 1.23 in August 2013. In  Severe hypokalemia. His magnesium level is within normal limits. Hypokalemia may be secondary to previous Lasix therapy. He is status post intravenous potassium chloride runs. Will continue oral potassium chloride supplementation and potassium in his baseline IV fluids.  Anemia. Etiology unknown. Based on the anemia panel, he has iron deficiency. We'll hold off on iron supplementation in the setting of sepsis.  Failure to thrive/frequent falls. This is likely secondary to the infection and advanced Parkinson's disease. His TSH is within normal limits. His RPR is negative. His vitamin B12 level is within normal limits. CT of his head negative for acute intracranial abnormalities. His wife desires skilled nursing facility placement for rehabilitation.  Parkinson's disease. He is followed chronically by Dr. Gerilyn Pilgrim. He is treated chronically with Sinemet. This will be continued.  Seizure disorder. He is treated chronically with Keppra. We'll continue this.  Speech therapy consultation and recommendations noted and appreciated. We'll continue dysphagia 3 diet with thin liquids.  Plan:  1. Continue IV fluid hydration. We'll try to wean  off dopamine as tolerated. The goal is to keep the patient's systolic blood pressure above 90 and his map at 65 or above. 2. Increase oral potassium chloride supplementation. 3. Continue supportive treatment. 4. When he is totally off of pressors, will consult physical therapy.    Total ICU/critical care time: 30 minutes.   LOS: 2 days   Jameire Kouba 06/21/2012, 8:26 AM

## 2012-06-21 NOTE — Progress Notes (Signed)
Speech Language Pathology Dysphagia Treatment Patient Details Name: Mark [name] MRN: [id number] DOB: 1946-04-13 Today's Date: 06/21/2012 Time: 1000-1028 SLP Time Calculation (min): 28 min  Assessment / Plan / Recommendation Clinical Impression  Pt reportedly did not eat very much breakfast this am. He drinks his calories with ease however. [name] was more alert and talkative/responsive this am. He consumed  orange sherbet, crackers, water, medications, and Breeze drink. Toward the end of trials, he was reclined slightly due to back discomfort and coughed immediately with sip water. Pt will need encouragement and patience from feeder during meals. 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 SLP will follow up on Thursday if still here.    Diet Recommendation  Continue with Current Diet: Dysphagia 3 (mechanical soft);Thin liquid    SLP Plan Continue with current plan of care      Swallowing Goals  SLP Swallowing Goals Patient will consume recommended diet without observed clinical signs of aspiration with: Moderate assistance Swallow Study Goal #1 - Progress: Progressing toward goal Patient will utilize recommended strategies during swallow to increase swallowing safety with: Moderate assistance Swallow Study Goal #2 - Progress: Progressing toward goal  General Temperature Spikes Noted: No Respiratory Status: Supplemental O2 delivered via (comment) Behavior/Cognition: Alert;Cooperative;Pleasant mood Oral Cavity - Dentition: Adequate natural dentition (aggressive oral care warranted) Patient Positioning: Upright in bed  Oral Cavity - Oral Hygiene Does patient have any of the following "at risk" factors?: Oxygen therapy - cannula, mask, simple oxygen devices;Nutritional status - dependent feeder Brush patient's teeth BID with toothbrush (using toothpaste with fluoride): Yes Patient is AT RISK - Oral Care Protocol  followed (see row info): Yes   Dysphagia Treatment Treatment focused on: Skilled observation of diet tolerance;Upgraded PO texture trials;Patient/family/caregiver education;Facilitation of oral phase Family/Caregiver Educated: Wife present Treatment Methods/Modalities: Skilled observation;Effortful swallow Patient observed directly with PO's: Yes Type of PO's observed: Regular;Dysphagia 3 (soft);Thin liquids;Dysphagia 1 (puree) (medications) Feeding: Total assist (able to feed some with left arm, but IV access prohibits mov) Liquids provided via: Straw Oral Phase Signs & Symptoms: Anterior loss/spillage;Prolonged bolus formation Pharyngeal Phase Signs & Symptoms: Suspected delayed swallow initiation;Immediate cough (immediate cough x 1 when slightly reclined with water) Type of cueing: Verbal;Tactile Amount of cueing: Minimal   Thank you,  Havery Moros, CCC-SLP [phone]      PORTER,DABNEY 06/21/2012, 11:06 AM

## 2012-06-22 ENCOUNTER — Inpatient Hospital Stay (HOSPITAL_COMMUNITY): Payer: Medicare HMO

## 2012-06-22 LAB — CULTURE, BLOOD (ROUTINE X 2)

## 2012-06-22 LAB — BASIC METABOLIC PANEL
Calcium: 8.2 mg/dL — ABNORMAL LOW (ref 8.4–10.5)
GFR calc Af Amer: 29 mL/min — ABNORMAL LOW (ref >90)
GFR calc non Af Amer: 25 mL/min — ABNORMAL LOW (ref >90)
Potassium: 3.8 mEq/L (ref 3.5–5.1)
Sodium: 140 mEq/L (ref 135–145)

## 2012-06-22 LAB — URINE CULTURE

## 2012-06-22 LAB — CBC
Hemoglobin: 8.8 g/dL — ABNORMAL LOW (ref 13.0–17.0)
Platelets: 221 10*3/uL (ref 150–400)
RBC: 2.86 MIL/uL — ABNORMAL LOW (ref 4.22–5.81)
WBC: 10.9 10*3/uL — ABNORMAL HIGH (ref 4.0–10.5)

## 2012-06-22 MED ORDER — ENOXAPARIN SODIUM 60 MG/0.6ML ~~LOC~~ SOLN
60.0000 mg | SUBCUTANEOUS | Status: DC
  Administered 2012-06-22 – 2012-06-25 (×4): 60 mg via SUBCUTANEOUS
  Filled 2012-06-22 (×4): qty 0.6

## 2012-06-22 MED ORDER — ENOXAPARIN SODIUM 30 MG/0.3ML ~~LOC~~ SOLN: 30.0000 mg | SUBCUTANEOUS | Status: DC

## 2012-06-22 NOTE — Progress Notes (Signed)
Shivering has resolved, HR 103, BP 113/47, Patient resting. Will monitor

## 2012-06-22 NOTE — Evaluation (Signed)
Physical Therapy Evaluation Patient Details Name: Mark [name] MRN: [id number] DOB: [birth year] Today's Date: 06/22/2012 Time: 1119-1208 PT Time Calculation (min): 49 min  PT Assessment / Plan / Recommendation Clinical Impression  Pt was seen for an eval in order to documant his current functional level.  Per wife's report, PTA the pt needed total care with all ADLs.  He sleeps in a lift chair and in order to transfer, the lift mechanism brings him up maximally whereupon the CG helps to shift his weight aneriorly so that he can stand with SBA.  The pt is able to ambulate short distances in the home (they do not have a w/c) with min assist of one person.  His gait is very Parkinsonian in nature per wife's report.  His Parkinson's sx are severe at best.  However, he has declined from this point since being in the hospital.  His trunk and extremeties are very rigid and he has very poor strength throughout.  I do not think he has the strength for standing, much less walking.  I believe that he has the potential to recover the function he has lost with short term SNF.  This will be important in maintaining his overall mangement   by CGs.  The pt is alert and cooperative, well motivated wto work with [place] in this endeavor.            PT Assessment  Patient needs continued PT services    Follow Up Recommendations  Post acute inpatient    Does the patient have the potential to tolerate intense rehabilitation   No, Recommend SNF  Barriers to Discharge Decreased caregiver support      Equipment Recommendations  Wheelchair (measurements)    Recommendations for Other Services OT consult   Frequency Min 3X/week    Precautions / Restrictions Precautions Precautions: Fall Restrictions Weight Bearing Restrictions: No Other Position/Activity Restrictions: no restrictions from an orthopedic point of view   Pertinent Vitals/Pain       Mobility  Bed Mobility Bed Mobility: Rolling Right;Rolling  Left;Supine to Sit;Sit to Supine Rolling Right: 1: +1 Total assist Rolling Left: 1: +1 Total assist Supine to Sit: 1: +1 Total assist;HOB elevated (HOB at max elevation) Sit to Supine: 1: +1 Total assist Sit to Supine: Patient Percentage: 10% Details for Bed Mobility Assistance: pt doesn't have the strength to lift LEs into the bed Transfers Transfers: Not assessed Transfer via Lift Equipment: Hydrographic surveyor Details for Transfer Assistance: pt does not have the power in LEs to stand     Shoulder Instructions     Exercises     PT Diagnosis: Difficulty walking;Generalized weakness  PT Problem List: Decreased strength;Decreased range of motion;Decreased activity tolerance;Decreased mobility;Decreased coordination;Impaired tone PT Treatment Interventions: Functional mobility training;Therapeutic activities;Therapeutic exercise   PT Goals    Visit Information  Last PT Received On: 06/22/12    Subjective Data  Subjective: Pt voices no c/o...he does tell me that he has gotten very stiff from lying in bed for so long here in the hospital Patient Stated Goal: none stated   Prior Functioning  Home Living Lives With: Spouse Available Help at Discharge: Family;Available 24 hours/day (family is burned out and not able to care for him) Type of Home: House Home Access: Stairs to enter Home Layout: One level Additional Comments: pt sleeps in a lift chair Prior Function Level of Independence: Needs assistance Needs Assistance: Bathing;Dressing;Feeding;Grooming;Meal Prep;Light Housekeeping;Gait;Transfers;Toileting (pt is incontinent) Bath: Total Dressing: Total Feeding: Maximal Grooming: Total Toileting:  Total Meal Prep: Total Light Housekeeping: Total Gait Assistance: pt is able to ambulate short distances in the home with 1 person assist...he does not use an assistive device Transfer Assistance: pt needs a lift chair to go from sit to stand...he needs assist to transfer his body weight  anteriorly in order to get COG over BOS Able to Take Stairs?: No Driving: No Vocation: Retired Musician: Expressive difficulties (very soft voice)    Cognition  Overall Cognitive Status: Appears within functional limits for tasks assessed/performed Arousal/Alertness: Awake/alert Orientation Level: Appears intact for tasks assessed Behavior During Session: [facility] for tasks performed    Extremity/Trunk Assessment Right Lower Extremity Assessment RLE ROM/Strength/Tone: Deficits RLE ROM/Strength/Tone Deficits: very rigid extremity initially which did relax with repetiotions of ROM ex...strength on MMT is 1+/5 at ankle, 2/5 at knee and 2/5 at hip RLE Sensation: WFL - Light Touch RLE Coordination: Deficits RLE Coordination Deficits: all coordination limited due to rigidity Left Lower Extremity Assessment LLE ROM/Strength/Tone: Deficits LLE ROM/Strength/Tone Deficits: as with RLE, his leg is very rigid but it responds well to ROM ex...strength at ankle is 3-/5, knee =3-/5, hip = 3-/5 LLE Sensation: WFL - Light Touch Trunk Assessment Trunk Assessment: Other exceptions Trunk Exceptions: Pt has limited cervical ROM but is able to rotate his head about 50% of normal...his head is held in flexion while sitting   Balance Balance Balance Assessed: Yes Static Sitting Balance Static Sitting - Balance Support: Left upper extremity supported;Feet supported Static Sitting - Level of Assistance: 5: Stand by assistance Dynamic Sitting Balance Dynamic Sitting - Balance Support: Left upper extremity supported Dynamic Sitting - Level of Assistance: 5: Stand by assistance Reach (Patient is able to reach ___ inches to right, left, forward, back): pt is able to weight shift anterior and posterior within a 6" arc of motion with no LOB  End of Session PT - End of Session Activity Tolerance: Patient tolerated treatment well Patient left: in bed;with call bell/phone within reach;with nursing in  room Nurse Communication: Mobility status  GP     Konrad Penta 06/22/2012, 1:15 PM

## 2012-06-22 NOTE — Progress Notes (Signed)
Patient shivering, blankets applied, Temp. 100.0.  HR elevated at 125-130, BP elevated 167/88., Pt complains of back pain and requests medication.  Oxycodone 5mg  po given.  ELink notified as well as MD on call.  Will continue to monitor.

## 2012-06-22 NOTE — Progress Notes (Signed)
Subjective: The patient has no new complaints of low back pain. He has no complaints of chest pain, shortness of breath, or abdominal pain. It is noted for recurrent fever this morning/overnight.  Objective: Vital signs in last 24 hours: Filed Vitals:   06/22/12 0630 06/22/12 0700 06/22/12 0800 06/22/12 0829  BP: 114/51 95/49 108/54   Pulse: 84 83 78   Temp:    99.2 F (37.3 C)  TempSrc:    Axillary  Resp: 14 16 15    Height:      Weight:      SpO2: 94% 95% 97%     Intake/Output Summary (Last 24 hours) at 06/22/12 0857 Last data filed at 06/22/12 0815  Gross per 24 hour  Intake   3970 ml  Output   2100 ml  Net   1870 ml    Weight change: 6.7 kg (14 lb 12.3 oz)  Physical exam: General: No acute distress. Parkinsonian facies. Lungs: Clear anteriorly with decreased breath sounds in the bases. Heart: S1, S2, with borderline tachycardia. Abdomen: Softer, positive bowel sounds, nontender, obese, no masses. GU: Indwelling Foley catheter draining yellow urine. Extremities: Trace of pedal edema bilaterally. Neurologic/psychiatric: He is alert. He is oriented to himself and hospital. He has a parkinsonian facies. He has a mild bilateral pinholing tremor. He has cogwheel rigidity of his arms. Cranial nerves II through XII are grossly intact.  Lab Results: Basic Metabolic Panel:  Basename 06/22/12 0437 06/21/12 0417 06/20/12 0414  NA 140 139 --  K 3.8 3.1* --  CL 111 106 --  CO2 21 23 --  GLUCOSE 116* 111* --  BUN 33* 34* --  CREATININE 2.50* 2.39* --  CALCIUM 8.2* 8.4 --  MG -- -- 2.0  PHOS -- -- --   Liver Function Tests:  [facility] 06/20/12 0414 06/19/12 1936  AST 21 20  ALT 12 9  ALKPHOS 44 54  BILITOT 0.4 0.4  PROT 5.6* 7.1  ALBUMIN 2.4* 3.2*   No results found for this basename: LIPASE:2,AMYLASE:2 in the last 72 hours No results found for this basename: AMMONIA:2 in the last 72 hours CBC:  Basename 06/22/12 0437 06/20/12 0414 06/19/12 1936  WBC 10.9* 15.3*  --  NEUTROABS -- -- 11.9*  HGB 8.8* 9.5* --  HCT 25.2* 27.1* --  MCV 88.1 87.4 --  PLT 221 242 --   Cardiac Enzymes: No results found for this basename: CKTOTAL:3,CKMB:3,CKMBINDEX:3,TROPONINI:3 in the last 72 hours BNP: No results found for this basename: PROBNP:3 in the last 72 hours D-Dimer: No results found for this basename: DDIMER:2 in the last 72 hours CBG: No results found for this basename: GLUCAP:6 in the last 72 hours Hemoglobin A1C: No results found for this basename: HGBA1C in the last 72 hours Fasting Lipid Panel: No results found for this basename: CHOL,HDL,LDLCALC,TRIG,CHOLHDL,LDLDIRECT in the last 72 hours Thyroid Function Tests:  Basename 06/20/12 0414  TSH 0.528  T4TOTAL --  FREET4 --  T3FREE --  THYROIDAB --   Anemia Panel:  Basename 06/20/12 0414  VITAMINB12 898  FOLATE 7.8  FERRITIN 328*  TIBC 165*  IRON 15*  RETICCTPCT 1.0   Coagulation:  Basename 06/20/12 0414  LABPROT 15.2  INR 1.22   Urine Drug Screen: Drugs of Abuse     Component Value Date/Time   LABOPIA NONE DETECTED 10/12/2008 1146   COCAINSCRNUR NONE DETECTED 10/12/2008 1146   LABBENZ NONE DETECTED 10/12/2008 1146   AMPHETMU NONE DETECTED 10/12/2008 1146   THCU NONE DETECTED 10/12/2008 1146  LABBARB  Value: NONE DETECTED        DRUG SCREEN FOR MEDICAL PURPOSES ONLY.  IF CONFIRMATION IS NEEDED FOR ANY PURPOSE, NOTIFY LAB WITHIN 5 DAYS.        LOWEST DETECTABLE LIMITS FOR URINE DRUG SCREEN Drug Class       Cutoff (ng/mL) Amphetamine      1000 Barbiturate      200 Benzodiazepine   200 Tricyclics       300 Opiates          300 Cocaine          300 THC              50 10/12/2008 1146    Alcohol Level: No results found for this basename: ETH:2 in the last 72 hours Urinalysis:  Basename 06/19/12 1916  COLORURINE YELLOW  LABSPEC 1.020  PHURINE 6.0  GLUCOSEU NEGATIVE  HGBUR LARGE*  BILIRUBINUR NEGATIVE  KETONESUR NEGATIVE  PROTEINUR 100*  UROBILINOGEN 0.2  NITRITE POSITIVE*  LEUKOCYTESUR  MODERATE*   Misc. Labs:   Micro: Recent Results (from the past 240 hour(s))  URINE CULTURE     Status: Normal (Preliminary result)   Collection Time   06/19/12  7:18 PM      Component Value Range Status Comment   Specimen Description URINE, CATHETERIZED   Final    Special Requests NONE   Final    Culture  Setup Time 06/20/2012 01:22   Final    Colony Count >=100,000 COLONIES/ML   Final    Culture ESCHERICHIA COLI   Final    Report Status PENDING   Incomplete   CULTURE, BLOOD (ROUTINE X 2)     Status: Normal   Collection Time   06/19/12  7:36 PM      Component Value Range Status Comment   Specimen Description BLOOD RIGHT ARM   Final    Special Requests BOTTLES DRAWN AEROBIC AND ANAEROBIC 8CC   Final    Culture  Setup Time 06/21/2012 02:06   Final    Culture     Final    Value: ESCHERICHIA COLI     Note: SUSCEPTIBILITIES PERFORMED ON PREVIOUS CULTURE WITHIN THE LAST 5 DAYS.     Note: Gram Stain Report Called to,Read Back By and Verified With: KOGER L 1358 06/20/12 BY Luetta Nutting M Performed at [facility]   Report Status 06/22/2012 FINAL   Final   CULTURE, BLOOD (ROUTINE X 2)     Status: Normal   Collection Time   06/20/12 12:16 AM      Component Value Range Status Comment   Specimen Description BLOOD LEFT ANTECUBITAL   Final    Special Requests BOTTLES DRAWN AEROBIC AND ANAEROBIC 8CC   Final    Culture  Setup Time 06/20/2012 13:40   Final    Culture     Final    Value: ESCHERICHIA COLI     1358 Note: Gram Stain Report Called to,Read Back By and Verified With: KOGER L 06/20/2012 BAUGHAM   Report Status 06/22/2012 FINAL   Final    Organism ID, Bacteria ESCHERICHIA COLI   Final   MRSA PCR SCREENING     Status: Normal   Collection Time   06/20/12  3:00 AM      Component Value Range Status Comment   MRSA by PCR NEGATIVE  NEGATIVE Final     Studies/Results: Dg Chest Port 1 View  06/20/2012  *RADIOLOGY REPORT*  Clinical Data:  PORTABLE CHEST - 1  VIEW  Comparison:  Portable exam 1514 hours compared to 06/19/2012  Findings: Left arm PICC line, tip projecting over SVC near cavoatrial junction. Upper normal heart size. Calcified tortuous aorta. Pulmonary vascularity normal. Minimal right basilar atelectasis. Lungs otherwise clear. No pleural effusion or pneumothorax.  IMPRESSION: Tip of left arm PICC line projects over the SVC near the cavoatrial junction. Minimal right basilar atelectasis.   Original Report Authenticated By: Lollie Marrow, M.D.     Medications:  Scheduled:    . antiseptic oral rinse  15 mL Mouth Rinse BID  . aspirin EC  81 mg Oral Daily  . atorvastatin  40 mg Oral q1800  . carbidopa-levodopa  1 tablet Oral TID  . clopidogrel  75 mg Oral Daily  . docusate sodium  100 mg Oral BID  . enoxaparin (LOVENOX) injection  60 mg Subcutaneous Q24H  . feeding supplement  30 mL Oral BID BM  . feeding supplement  1 Container Oral BID BM  . influenza  inactive virus vaccine  0.5 mL Intramuscular Tomorrow-1000  . levETIRAcetam  250 mg Oral BID  . multivitamin with minerals  1 tablet Oral Daily  . piperacillin-tazobactam (ZOSYN)  IV  3.375 g Intravenous Q8H  . potassium chloride  40 mEq Oral BID  . sodium chloride  10-40 mL Intracatheter Q12H  . thiamine  100 mg Oral Daily  . tuberculin  5 Units Intradermal Once  . vancomycin  1,500 mg Intravenous Q24H  . DISCONTD: enoxaparin (LOVENOX) injection  30 mg Subcutaneous Q24H   Continuous:    . 0.9 % NaCl with KCl 40 mEq / L 100 mL/hr at 06/22/12 0800  . DOPamine Stopped (06/21/12 0749)   ZOX:[id number], acetaminophen, albuterol, ondansetron (ZOFRAN) IV, ondansetron, oxyCODONE, sodium chloride  Assessment: Principal Problem:  *ARF (acute renal failure) Active Problems:  Sepsis  Hypotension  Bacteremia due to Escherichia coli  CAD (coronary artery disease)  HTN (hypertension)  Parkinson disease  FTT (failure to thrive) in adult  UTI (lower urinary tract infection)  Dehydration  Frequent  falls  Hypokalemia  Anemia  Seizure disorder    1. Escherichia coli urinary tract infection/Escherichia coli bacteremia/sepsis. He is on vancomycin and Zosyn. He is still spiking through these antibiotics. Would consider Primaxin, but it would lower his seizure threshold. Will await the results of the sensitivities of the Escherichia coli.  Hypovolemia/dehydration.resolving on IV fluids.  Acute renal failure, likely secondary to hypovolemia/prerenal azotemia/hypoperfusion. His renal function is stable with no significant improvement or deterioration.Marland Kitchen His creatinine was 1.23 in August 2013. We'll continue IV fluids and monitor.  Severe hypokalemia. Resolved for now. Will continue supplementation as needed. His magnesium level is within normal limits.   Anemia. Etiology unknown, but is likely secondary to acute infection, renal insufficiency, and in part, hemodilution from IV fluids. Based on the anemia panel, he has iron deficiency. We'll hold off on iron supplementation in the setting of sepsis.  Failure to thrive/frequent falls. This is likely secondary to the infection and advanced Parkinson's disease. His TSH is within normal limits. His RPR is negative. His vitamin B12 level is within normal limits. CT of his head negative for acute intracranial abnormalities. His wife desires skilled nursing facility placement for rehabilitation versus long-term for safety reasons and her inability to take care of him at home.  Parkinson's disease. He is followed chronically by Dr. Gerilyn Pilgrim. He is treated chronically with Sinemet. This will be continued.  Seizure disorder. He is treated chronically with Keppra. We'll continue this.  Speech therapy consultation and recommendations noted and appreciated. We'll continue dysphagia 3 diet with thin liquids.   Plan:  1. We will order a CT of his chest and abdomen/pelvis for further evaluation of persistent fever. 2. Will await the sensitivities on  Escherichia coli before considering a change in antibiotics. We'll consider Primaxin, but it would lower his seizure threshold. 3. PT consultation and recommendations. 4. Transfer step down.        LOS: 3 days   Warnell Rasnic 06/22/2012, 8:57 AM

## 2012-06-22 NOTE — Clinical Social Work Note (Signed)
[facility] came to assess patient last night, wife to visit facility today.  Met w wife and discussed need to determine patient ability to pay for care at either SNF or ALF level as insurance may not pay for rehab at [facility] and does not pay for ALF placement.  Wife working to get lawyer involved to discuss finances w patient.  Wife adamant that patient not return home at discharge as she cannot continue to care for him.  Santa Genera, LCSW Clinical Social Worker [phone])

## 2012-06-22 NOTE — Progress Notes (Signed)
ANTIBIOTIC CONSULT NOTE   Pharmacy Consult for Vancomycin Also on Zosyn Indication: sepsis  No Known Allergies  Patient Measurements: Height: 6' (182.9 cm) Weight: 253 lb 12 oz (115.1 kg) IBW/kg (Calculated) : 77.6   Vital Signs: Temp: 99.2 F (37.3 C) (10/16 0829) Temp src: Axillary (10/16 0829) BP: 108/54 mmHg (10/16 0800) Pulse Rate: 78  (10/16 0800) Intake/Output from previous day: 10/15 0701 - 10/16 0700 In: 3980.7 [P.O.:900; I.V.:2430.7; IV Piggyback:650] Out: 2100 [Urine:2100] Intake/Output from this shift: Total I/O In: 510 [P.O.:360; I.V.:100; IV Piggyback:50] Out: -   Labs:  Basename 06/22/12 0437 06/21/12 0417 06/20/12 1538 06/20/12 0414 06/19/12 1936  WBC 10.9* -- -- 15.3* 14.4*  HGB 8.8* -- -- 9.5* 10.5*  PLT 221 -- -- 242 276  LABCREA -- -- -- -- --  CREATININE 2.50* 2.39* 2.56* -- --   Estimated Creatinine Clearance: 38.1 ml/min (by C-G formula based on Cr of 2.5). No results found for this basename: VANCOTROUGH:2,VANCOPEAK:2,VANCORANDOM:2,GENTTROUGH:2,GENTPEAK:2,GENTRANDOM:2,TOBRATROUGH:2,TOBRAPEAK:2,TOBRARND:2,AMIKACINPEAK:2,AMIKACINTROU:2,AMIKACIN:2, in the last 72 hours   Microbiology: Recent Results (from the past 720 hour(s))  URINE CULTURE     Status: Normal   Collection Time   06/19/12  7:18 PM      Component Value Range Status Comment   Specimen Description URINE, CATHETERIZED   Final    Special Requests NONE   Final    Culture  Setup Time 06/20/2012 01:22   Final    Colony Count >=100,000 COLONIES/ML   Final    Culture ESCHERICHIA COLI   Final    Report Status 06/22/2012 FINAL   Final    Organism ID, Bacteria ESCHERICHIA COLI   Final   CULTURE, BLOOD (ROUTINE X 2)     Status: Normal   Collection Time   06/19/12  7:36 PM      Component Value Range Status Comment   Specimen Description BLOOD RIGHT ARM   Final    Special Requests BOTTLES DRAWN AEROBIC AND ANAEROBIC 8CC   Final    Culture  Setup Time 06/21/2012 02:06   Final    Culture      Final    Value: ESCHERICHIA COLI     Note: SUSCEPTIBILITIES PERFORMED ON PREVIOUS CULTURE WITHIN THE LAST 5 DAYS.     Note: Gram Stain Report Called to,Read Back By and Verified With: KOGER L 1358 06/20/12 BY Luetta Nutting M Performed at [facility]   Report Status 06/22/2012 FINAL   Final   CULTURE, BLOOD (ROUTINE X 2)     Status: Normal   Collection Time   06/20/12 12:16 AM      Component Value Range Status Comment   Specimen Description BLOOD LEFT ANTECUBITAL   Final    Special Requests BOTTLES DRAWN AEROBIC AND ANAEROBIC 8CC   Final    Culture  Setup Time 06/20/2012 13:40   Final    Culture     Final    Value: ESCHERICHIA COLI     1358 Note: Gram Stain Report Called to,Read Back By and Verified With: KOGER L 06/20/2012 [place]   Report Status 06/22/2012 FINAL   Final    Organism ID, Bacteria ESCHERICHIA COLI   Final   MRSA PCR SCREENING     Status: Normal   Collection Time   06/20/12  3:00 AM      Component Value Range Status Comment   MRSA by PCR NEGATIVE  NEGATIVE Final    Culture, blood (routine x 2) Status: Final result MyChart: Not Released  Component  Value    Specimen Description  BLOOD LEFT ANTECUBITAL    Special Requests  BOTTLES DRAWN AEROBIC AND ANAEROBIC 8CC    Culture Setup Time  06/20/2012 13:40    Culture  ESCHERICHIA COLI 1358 Note: Gram Stain Report Called to,Read Back By and Verified With: KOGER L 06/20/2012 [place]    Report Status  06/22/2012 FINAL    Organism ID, Bacteria  ESCHERICHIA COLI    Resulting Agency  [facility]     Culture & Susceptibility     ESCHERICHIA COLI          Antibiotic  Sensitivity  Microscan  Status      AMPICILLIN  Resistant  >=32  Final      Method:  MIC      AMPICILLIN/SULBACTAM  Resistant  >=32  Final      Method:  MIC      CEFAZOLIN  Resistant  >=64  Final      Method:  MIC      CEFEPIME  Sensitive  <=1  Final      Method:  MIC      CEFOXITIN  Resistant  >=64  Final      Method:  MIC      CEFTAZIDIME   Intermediate  16  Final      Method:  MIC      CEFTRIAXONE  Intermediate  16  Final      Method:  MIC      CIPROFLOXACIN  Sensitive  <=0.25  Final      Method:  MIC      GENTAMICIN  Sensitive  <=1  Final      Method:  MIC      IMIPENEM  Sensitive  <=0.25  Final      Method:  MIC      PIP/TAZO  Sensitive  8  Final      Method:  MIC      TOBRAMYCIN  Sensitive  <=1  Final      Method:  MIC      TRIMETH/SULFA  Sensitive  <=20  Final      Method:  MIC       Comments  ESCHERICHIA COLI (MIC)        ESCHERICHIA COLI             Medical History: Past Medical History  Diagnosis Date  . Hypertension   . PAF (paroxysmal atrial fibrillation)   . Parkinson's disease   . Gout   . Coronary artery disease     SEVERE 3-VESSEL DX WITH NORMAL EF  . Fall   . Fracture of right olecranon process 10/23/2011  . Stones in the urinary tract   . Arthritis   . Stroke 10/12    Hx of remote stroke, RT SIDED WEAKNESS  . Non-ST elevated myocardial infarction (non-STEMI)   . MI (myocardial infarction) 08/2011    /H&P (05/12/2012)  . Sleep apnea   . Seizures 12/10/2011    HAD SEIZURE  . Skin cancer     left shoulder  . Bacteremia due to Escherichia coli 06/21/2012   Medications:  Scheduled:     . antiseptic oral rinse  15 mL Mouth Rinse BID  . aspirin EC  81 mg Oral Daily  . atorvastatin  40 mg Oral q1800  . carbidopa-levodopa  1 tablet Oral TID  . clopidogrel  75 mg Oral Daily  . docusate sodium  100 mg Oral BID  . enoxaparin (LOVENOX) injection  60 mg Subcutaneous Q24H  . feeding supplement  30 mL Oral BID BM  . feeding supplement  1 Container Oral BID BM  . levETIRAcetam  250 mg Oral BID  . multivitamin with minerals  1 tablet Oral Daily  . piperacillin-tazobactam (ZOSYN)  IV  3.375 g Intravenous Q8H  . potassium chloride  40 mEq Oral BID  . sodium chloride  10-40 mL Intracatheter Q12H  . thiamine  100 mg Oral Daily  . tuberculin  5 Units Intradermal Once  . vancomycin  1,500 mg  Intravenous Q24H  . DISCONTD: enoxaparin (LOVENOX) injection  30 mg Subcutaneous Q24H   Assessment: 66yo male admitted with possible sepsis, ARI, and leukocytosis.  Leukocytosis is improving but pt still febrile.  Cultures and sensitivities noted as above.  He is obese.  SCr elevated.  Goal of Therapy:  Vancomycin trough level 15-20 mcg/ml Eradicate infection.  Plan: Would favor d/c Zosyn and switch to Cipro  Monitor SCr closely Continue Vancomycin 1500mg  iv q24hrs Check trough on Friday if Vanco continued Monitor labs, renal fxn, and cultures per protocol  Valrie Hart A 06/22/2012,10:26 AM

## 2012-06-23 ENCOUNTER — Inpatient Hospital Stay (HOSPITAL_COMMUNITY): Payer: Medicare HMO

## 2012-06-23 ENCOUNTER — Encounter (HOSPITAL_COMMUNITY): Payer: Self-pay | Admitting: Internal Medicine

## 2012-06-23 DIAGNOSIS — K769 Liver disease, unspecified: Secondary | ICD-10-CM | POA: Diagnosis present

## 2012-06-23 DIAGNOSIS — N10 Acute tubulo-interstitial nephritis: Secondary | ICD-10-CM

## 2012-06-23 HISTORY — DX: Acute pyelonephritis: N10

## 2012-06-23 LAB — BASIC METABOLIC PANEL
CO2: 21 mEq/L (ref 19–32)
Calcium: 8.2 mg/dL — ABNORMAL LOW (ref 8.4–10.5)
Chloride: 111 mEq/L (ref 96–112)
Glucose, Bld: 98 mg/dL (ref 70–99)
Potassium: 4.6 mEq/L (ref 3.5–5.1)
Sodium: 139 mEq/L (ref 135–145)

## 2012-06-23 LAB — CBC
HCT: 25.1 % — ABNORMAL LOW (ref 39.0–52.0)
Hemoglobin: 8.6 g/dL — ABNORMAL LOW (ref 13.0–17.0)
MCV: 88.7 fL (ref 78.0–100.0)
Platelets: 243 10*3/uL (ref 150–400)
RBC: 2.83 MIL/uL — ABNORMAL LOW (ref 4.22–5.81)
WBC: 10.7 10*3/uL — ABNORMAL HIGH (ref 4.0–10.5)

## 2012-06-23 MED ORDER — CIPROFLOXACIN IN D5W 400 MG/200ML IV SOLN
400.0000 mg | Freq: Two times a day (BID) | INTRAVENOUS | Status: DC
  Administered 2012-06-23 (×2): 400 mg via INTRAVENOUS
  Filled 2012-06-23 (×9): qty 200

## 2012-06-23 NOTE — Care Management Note (Unsigned)
    Page 1 of 1   06/23/2012     3:54:08 PM   CARE MANAGEMENT NOTE 06/23/2012  Patient:  Mark [name], Mark [name]   Account Number:  [id number]  Date Initiated:  06/23/2012  Documentation initiated by:  Rosemary Holms  Subjective/Objective Assessment:   Pt admitted from home with spouse. Pt has advanced parkinsons and admitted iwth sepsis. PT's evaluation suggests tht pt would need a short term at rehab to return to his baseline before returning to home with CG.     Action/Plan:   Anticipated DC Date:  06/25/2012   Anticipated DC Plan:  SKILLED NURSING FACILITY  In-house referral  Clinical Social Worker      DC Planning Services  CM consult      Choice offered to / List presented to:             Status of service:   Medicare Important Message given?   (If response is "NO", the following Medicare IM given date fields will be blank) Date Medicare IM given:   Date Additional Medicare IM given:    Discharge Disposition:    Per UR Regulation:    If discussed at Long Length of Stay Meetings, dates discussed:    Comments:  06/23/12 Rosemary Holms RN BSN CM

## 2012-06-23 NOTE — Clinical Social Work Note (Signed)
Patient informed of bed offers and states he wants [facility].  Admissions at [facility] alerted and asked to pursue [facility] authorization, [facility] rep contacted.  Santa Genera, LCSW Clinical Social Worker [phone])

## 2012-06-23 NOTE — Progress Notes (Signed)
Report called to [facility] on department 300.  Patient is ready for transfer to tele after ultrasound.  Patient sent to ultrasound now and then will go to department 300.  No acute distress noted.  Writer called patients wife to tell her patient was transferring to room 318.

## 2012-06-23 NOTE — Progress Notes (Signed)
ANTIBIOTIC CONSULT NOTE   Pharmacy Consult for Vancomycin, Cipro Indication: urosepsis  No Known Allergies  Patient Measurements: Height: 6' (182.9 cm) Weight: 238 lb 5.1 oz (108.1 kg) IBW/kg (Calculated) : 77.6   Vital Signs: Temp: 99.4 F (37.4 C) (10/17 0840) Temp src: Oral (10/17 0840) BP: 119/57 mmHg (10/17 0822) Pulse Rate: 86  (10/17 0822) Intake/Output from previous day: 10/16 0701 - 10/17 0700 In: 3830 [P.O.:960; I.V.:2220; IV Piggyback:650] Out: 2550 [Urine:2550] Intake/Output from this shift: Total I/O In: 480 [P.O.:480] Out: 1125 [Urine:1125]  Labs:  [facility] 06/23/12 0433 06/22/12 0437 06/21/12 0417  WBC 10.7* 10.9* --  HGB 8.6* 8.8* --  PLT 243 221 --  LABCREA -- -- --  CREATININE 2.12* 2.50* 2.39*   Estimated Creatinine Clearance: 43.5 ml/min (by C-G formula based on Cr of 2.12). No results found for this basename: VANCOTROUGH:2,VANCOPEAK:2,VANCORANDOM:2,GENTTROUGH:2,GENTPEAK:2,GENTRANDOM:2,TOBRATROUGH:2,TOBRAPEAK:2,TOBRARND:2,AMIKACINPEAK:2,AMIKACINTROU:2,AMIKACIN:2, in the last 72 hours   Microbiology: Recent Results (from the past 720 hour(s))  URINE CULTURE     Status: Normal   Collection Time   06/19/12  7:18 PM      Component Value Range Status Comment   Specimen Description URINE, CATHETERIZED   Final    Special Requests NONE   Final    Culture  Setup Time 06/20/2012 01:22   Final    Colony Count >=100,000 COLONIES/ML   Final    Culture ESCHERICHIA COLI   Final    Report Status 06/22/2012 FINAL   Final    Organism ID, Bacteria ESCHERICHIA COLI   Final   CULTURE, BLOOD (ROUTINE X 2)     Status: Normal   Collection Time   06/19/12  7:36 PM      Component Value Range Status Comment   Specimen Description BLOOD RIGHT ARM   Final    Special Requests BOTTLES DRAWN AEROBIC AND ANAEROBIC 8CC   Final    Culture  Setup Time 06/21/2012 02:06   Final    Culture     Final    Value: ESCHERICHIA COLI     Note: SUSCEPTIBILITIES PERFORMED ON PREVIOUS  CULTURE WITHIN THE LAST 5 DAYS.     Note: Gram Stain Report Called to,Read Back By and Verified With: KOGER L [id number] 06/20/12 BY Luetta Nutting M Performed at [facility]   Report Status 06/22/2012 FINAL   Final   CULTURE, BLOOD (ROUTINE X 2)     Status: Normal   Collection Time   06/20/12 12:16 AM      Component Value Range Status Comment   Specimen Description BLOOD LEFT ANTECUBITAL   Final    Special Requests BOTTLES DRAWN AEROBIC AND ANAEROBIC 8CC   Final    Culture  Setup Time 06/20/2012 13:40   Final    Culture     Final    Value: ESCHERICHIA COLI     1358 Note: Gram Stain Report Called to,Read Back By and Verified With: KOGER L 06/20/2012 [facility]   Report Status 06/22/2012 FINAL   Final    Organism ID, Bacteria ESCHERICHIA COLI   Final   MRSA PCR SCREENING     Status: Normal   Collection Time   06/20/12  3:00 AM      Component Value Range Status Comment   MRSA by PCR NEGATIVE  NEGATIVE Final    Culture, blood (routine x 2) Status: Final result MyChart: Not Released         Component  Value    Specimen Description  BLOOD LEFT ANTECUBITAL    Special Requests  BOTTLES DRAWN AEROBIC AND ANAEROBIC 8CC    Culture Setup Time  06/20/2012 13:40    Culture  ESCHERICHIA COLI 1358 Note: Gram Stain Report Called to,Read Back By and Verified With: KOGER L 06/20/2012 [facility]    Report Status  06/22/2012 FINAL    Organism ID, Bacteria  ESCHERICHIA COLI    Resulting Agency  [facility]     Culture & Susceptibility     ESCHERICHIA COLI          Antibiotic  Sensitivity  Microscan  Status      AMPICILLIN  Resistant  >=32  Final      Method:  MIC      AMPICILLIN/SULBACTAM  Resistant  >=32  Final      Method:  MIC      CEFAZOLIN  Resistant  >=64  Final      Method:  MIC      CEFEPIME  Sensitive  <=1  Final      Method:  MIC      CEFOXITIN  Resistant  >=64  Final      Method:  MIC      CEFTAZIDIME  Intermediate  16  Final      Method:  MIC      CEFTRIAXONE  Intermediate  16  Final       Method:  MIC      CIPROFLOXACIN  Sensitive  <=0.25  Final      Method:  MIC      GENTAMICIN  Sensitive  <=1  Final      Method:  MIC      IMIPENEM  Sensitive  <=0.25  Final      Method:  MIC      PIP/TAZO  Sensitive  8  Final      Method:  MIC      TOBRAMYCIN  Sensitive  <=1  Final      Method:  MIC      TRIMETH/SULFA  Sensitive  <=20  Final      Method:  MIC       Comments  ESCHERICHIA COLI (MIC)        ESCHERICHIA COLI             Medical History: Past Medical History  Diagnosis Date  . Hypertension   . PAF (paroxysmal atrial fibrillation)   . Parkinson's disease   . Gout   . Coronary artery disease     SEVERE 3-VESSEL DX WITH NORMAL EF  . Fall   . Fracture of right olecranon process 10/23/2011  . Stones in the urinary tract   . Arthritis   . Stroke 10/12    Hx of remote stroke, RT SIDED WEAKNESS  . Non-ST elevated myocardial infarction (non-STEMI)   . MI (myocardial infarction) 08/2011    /H&P (05/12/2012)  . Sleep apnea   . Seizures 12/10/2011    HAD SEIZURE  . Skin cancer     left shoulder  . Bacteremia due to Escherichia coli 06/21/2012  . Pyelonephritis, acute 06/23/2012  . 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   Medications:  Scheduled:     . antiseptic oral rinse  15 mL Mouth Rinse BID  . aspirin EC  81 mg Oral Daily  . atorvastatin  40 mg Oral q1800  . carbidopa-levodopa  1 tablet Oral TID  . clopidogrel  75 mg Oral Daily  . docusate sodium  100 mg Oral BID  . enoxaparin (LOVENOX) injection  60 mg  Subcutaneous Q24H  . feeding supplement  30 mL Oral BID BM  . feeding supplement  1 Container Oral BID BM  . levETIRAcetam  250 mg Oral BID  . multivitamin with minerals  1 tablet Oral Daily  . sodium chloride  10-40 mL Intracatheter Q12H  . thiamine  100 mg Oral Daily  . vancomycin  1,500 mg Intravenous Q24H  . DISCONTD: piperacillin-tazobactam (ZOSYN)  IV  3.375 g Intravenous Q8H  . DISCONTD: potassium chloride  40 mEq Oral BID   Assessment: Mark [name]  obese male on day#4 Vancomycin & Zosyn for +Ecoli urosepsis. Cultures and sensitivities noted as above.  He is now afebrile and WBC trending down.  ARF noted- slight improvement in Scr today. Per MD orders will change Zosyn to Cipro today.  Noted plans to continue Vancomycin for 1-2 more days.   Goal of Therapy:  Vancomycin trough level 15-20 mcg/ml Eradicate infection.  Plan: Cipro 400mg  IV Q12h Continue Vancomycin 1500mg  iv q24hrs No further trough levels indicated unless current plan changes Monitor labs, renal fxn, and cultures per protocol  Moataz Tavis, [facility] Riding 06/23/2012,10:09 AM

## 2012-06-23 NOTE — Progress Notes (Signed)
Physical Therapy Treatment Patient Details Name: Mark [name] MRN: [id number] DOB: 10-09-1945 Today's Date: 06/23/2012 Time: 0865-7846 PT Time Calculation (min): 28 min  PT Assessment / Plan / Recommendation Comments on Treatment Session  Pt is alert and oriented, cooperative.  We initiated ther ex to all 4 extremeties for stretching as well as strengthening.  He was able to participate with UEs and RLE, but then was so fatigued that he could no longer work with me.  I did passive ROM to LLE in order to decrease overall rigidity.    Follow Up Recommendations        Does the patient have the potential to tolerate intense rehabilitation     Barriers to Discharge        Equipment Recommendations       Recommendations for Other Services    Frequency     Plan Discharge plan remains appropriate;Frequency remains appropriate    Precautions / Restrictions     Pertinent Vitals/Pain     Mobility  Bed Mobility Bed Mobility: Not assessed Transfers Transfers: Not assessed    Exercises General Exercises - Upper Extremity Shoulder Flexion: AAROM;PROM;Both;10 reps;Supine Shoulder ABduction: PROM;AAROM;Both;10 reps;Supine Shoulder Horizontal ADduction: AAROM;Both;10 reps;Supine (combine with cervical rotation) Elbow Extension: PROM;AAROM;Both;5 reps;Supine General Exercises - Lower Extremity Ankle Circles/Pumps: Strengthening;Both;10 reps;Supine Heel Slides: AAROM;PROM;Both;10 reps;Supine Hip ABduction/ADduction: PROM;AAROM;Both;10 reps;Supine   PT Diagnosis:    PT Problem List:   PT Treatment Interventions:     PT Goals Acute Rehab PT Goals PT Goal: Rolling Supine to Right Side - Progress: Progressing toward goal PT Goal: Rolling Supine to Left Side - Progress: Progressing toward goal PT Goal: Supine/Side to Sit - Progress: Not progressing PT Goal: Sit to Supine/Side - Progress: Not progressing  Visit Information  Last PT Received On: 06/23/12    Subjective Data  Subjective: no c/o   Cognition       Balance     End of Session PT - End of Session Activity Tolerance: Patient limited by fatigue Patient left: in bed;with call bell/phone within reach   GP     Myrlene Broker L 06/23/2012, 10:55 AM

## 2012-06-23 NOTE — Progress Notes (Signed)
Subjective: The patient has no new complaints. He has a good appetite. He has been eating well. No nausea vomiting.   Objective: Vital signs in last 24 hours: Filed Vitals:   06/23/12 0500 06/23/12 0600 06/23/12 0822 06/23/12 0840  BP: 94/48 135/63 119/57   Pulse: 77 94 86   Temp:    99.4 F (37.4 C)  TempSrc:    Oral  Resp:  20 19   Height:      Weight: 108.1 kg (238 lb 5.1 oz)     SpO2: 98% 96% 95%     Intake/Output Summary (Last 24 hours) at 06/23/12 0925 Last data filed at 06/23/12 0828  Gross per 24 hour  Intake   3800 ml  Output   2550 ml  Net   1250 ml    Weight change: -7 kg (-15 lb 6.9 oz)  Physical exam: General: No acute distress. Parkinsonian facies. Lungs: Clear anteriorly with decreased breath sounds in the bases. Heart: S1, S2, with borderline tachycardia. Abdomen: Softer, hyperactive bowel sounds, nontender, obese, no masses. GU: Indwelling Foley catheter draining yellow urine. Extremities: Trace of pedal edema bilaterally. Neurologic/psychiatric: He is alert. He is oriented to himself and hospital. He has a parkinsonian facies. He has a mild bilateral pinholing tremor. He has cogwheel rigidity of his arms. Cranial nerves II through XII are grossly intact.  Lab Results: Basic Metabolic Panel:  Basename 06/23/12 0433 06/22/12 0437  NA 139 140  K 4.6 3.8  CL 111 111  CO2 21 21  GLUCOSE 98 116*  BUN 28* 33*  CREATININE 2.12* 2.50*  CALCIUM 8.2* 8.2*  MG -- --  PHOS -- --   Liver Function Tests: No results found for this basename: AST:2,ALT:2,ALKPHOS:2,BILITOT:2,PROT:2,ALBUMIN:2 in the last 72 hours No results found for this basename: LIPASE:2,AMYLASE:2 in the last 72 hours No results found for this basename: AMMONIA:2 in the last 72 hours CBC:  Basename 06/23/12 0433 06/22/12 0437  WBC 10.7* 10.9*  NEUTROABS -- --  HGB 8.6* 8.8*  HCT 25.1* 25.2*  MCV 88.7 88.1  PLT 243 221   Cardiac Enzymes: No results found for this basename:  CKTOTAL:3,CKMB:3,CKMBINDEX:3,TROPONINI:3 in the last 72 hours BNP: No results found for this basename: PROBNP:3 in the last 72 hours D-Dimer: No results found for this basename: DDIMER:2 in the last 72 hours CBG: No results found for this basename: GLUCAP:6 in the last 72 hours Hemoglobin A1C: No results found for this basename: HGBA1C in the last 72 hours Fasting Lipid Panel: No results found for this basename: CHOL,HDL,LDLCALC,TRIG,CHOLHDL,LDLDIRECT in the last 72 hours Thyroid Function Tests: No results found for this basename: TSH,T4TOTAL,FREET4,T3FREE,THYROIDAB in the last 72 hours Anemia Panel: No results found for this basename: VITAMINB12,FOLATE,FERRITIN,TIBC,IRON,RETICCTPCT in the last 72 hours Coagulation: No results found for this basename: LABPROT:2,INR:2 in the last 72 hours Urine Drug Screen: Drugs of Abuse     Component Value Date/Time   LABOPIA NONE DETECTED 10/12/2008 1146   COCAINSCRNUR NONE DETECTED 10/12/2008 1146   LABBENZ NONE DETECTED 10/12/2008 1146   AMPHETMU NONE DETECTED 10/12/2008 1146   THCU NONE DETECTED 10/12/2008 1146   LABBARB  Value: NONE DETECTED        DRUG SCREEN FOR MEDICAL PURPOSES ONLY.  IF CONFIRMATION IS NEEDED FOR ANY PURPOSE, NOTIFY LAB WITHIN 5 DAYS.        LOWEST DETECTABLE LIMITS FOR URINE DRUG SCREEN Drug Class       Cutoff (ng/mL) Amphetamine      1000 Barbiturate  200 Benzodiazepine   200 Tricyclics       300 Opiates          300 Cocaine          300 THC              50 10/12/2008 1146    Alcohol Level: No results found for this basename: ETH:2 in the last 72 hours Urinalysis: No results found for this basename: COLORURINE:2,APPERANCEUR:2,LABSPEC:2,PHURINE:2,GLUCOSEU:2,HGBUR:2,BILIRUBINUR:2,KETONESUR:2,PROTEINUR:2,UROBILINOGEN:2,NITRITE:2,LEUKOCYTESUR:2 in the last 72 hours Misc. Labs:   Micro: Recent Results (from the past 240 hour(s))  URINE CULTURE     Status: Normal   Collection Time   06/19/12  7:18 PM      Component Value Range  Status Comment   Specimen Description URINE, CATHETERIZED   Final    Special Requests NONE   Final    Culture  Setup Time 06/20/2012 01:22   Final    Colony Count >=100,000 COLONIES/ML   Final    Culture ESCHERICHIA COLI   Final    Report Status 06/22/2012 FINAL   Final    Organism ID, Bacteria ESCHERICHIA COLI   Final   CULTURE, BLOOD (ROUTINE X 2)     Status: Normal   Collection Time   06/19/12  7:36 PM      Component Value Range Status Comment   Specimen Description BLOOD RIGHT ARM   Final    Special Requests BOTTLES DRAWN AEROBIC AND ANAEROBIC 8CC   Final    Culture  Setup Time 06/21/2012 02:06   Final    Culture     Final    Value: ESCHERICHIA COLI     Note: SUSCEPTIBILITIES PERFORMED ON PREVIOUS CULTURE WITHIN THE LAST 5 DAYS.     Note: Gram Stain Report Called to,Read Back By and Verified With: KOGER L [id number] 06/20/12 BY Luetta Nutting M Performed at [facility]   Report Status 06/22/2012 FINAL   Final   CULTURE, BLOOD (ROUTINE X 2)     Status: Normal   Collection Time   06/20/12 12:16 AM      Component Value Range Status Comment   Specimen Description BLOOD LEFT ANTECUBITAL   Final    Special Requests BOTTLES DRAWN AEROBIC AND ANAEROBIC 8CC   Final    Culture  Setup Time 06/20/2012 13:40   Final    Culture     Final    Value: ESCHERICHIA COLI     1358 Note: Gram Stain Report Called to,Read Back By and Verified With: KOGER L 06/20/2012 [facility]   Report Status 06/22/2012 FINAL   Final    Organism ID, Bacteria ESCHERICHIA COLI   Final   MRSA PCR SCREENING     Status: Normal   Collection Time   06/20/12  3:00 AM      Component Value Range Status Comment   MRSA by PCR NEGATIVE  NEGATIVE Final     Studies/Results: Ct Abdomen Pelvis Wo Contrast  06/22/2012  *RADIOLOGY REPORT*  Clinical Data:  Persistent fever, history stroke, Parkinson's disease, hypertension, coronary disease post MI and stenting, kidney stones, seizure, right parotid resection, gout  CT CHEST, ABDOMEN AND  PELVIS WITHOUT CONTRAST  Technique:  Multidetector CT imaging of the chest, abdomen and pelvis was performed following the standard protocol without IV contrast.  IV contrast not utilized due to renal dysfunction. Dilute oral contrast.  Sagittal and coronal MPR images reconstructed from axial data set.  Comparison:  None  CT CHEST  Findings: Scattered atherosclerotic calcifications aorta and coronary arteries  without aneurysm. Left arm PICC line, tip SVC. Small bilateral pleural effusions. Scattered normal-sized thoracic lymph nodes. Scattered areas of minimal linear subsegmental atelectasis bilaterally. No pulmonary infiltrate or pneumothorax. Multiple mild endplate compression fractures throughout the thoracic spine.  IMPRESSION: Small bilateral pleural effusions and minimal scattered atelectasis.  CT ABDOMEN AND PELVIS  Findings: Beam hardening artifacts from the patient's arms limit evaluation. Low attenuation lesion within liver adjacent to gallbladder question cyst 4.5 x 4.6 cm, potentially containing a partial septation. Spleen, pancreas and adrenal glands grossly unremarkable. Question nodule at superior pole right kidney 2.4 x 1.8 cm versus artifact. Bilateral perinephric and peripelvic edema extending along ureters. Minimal collecting system dilatation with slight prominence of the proximal ureters though the distal ureters appear decompressed. Urinary tract infection not excluded. Bladder decompressed by Foley catheter of the wall appears thickened, potentially artifactual but cannot exclude other causes of wall thickening. Scattered atherosclerotic calcifications. Slight prominent stool in the rectosigmoid colon. Tiny umbilical hernia containing fat.  Stomach and bowel loops grossly unremarkable. No mass, adenopathy, or free fluid. Mild scattered soft tissue edema including presacral. No discrete abscess collection, mass, adenopathy, or hernia. No acute lumbar spine abnormalities.  IMPRESSION:  Limitations of exam secondary to lack of IV contrast and beam hardening artifacts. Mild dilatation of renal collecting systems and proximal ureters with perinephric, peripelvic and periureteral edema. No calculi are identified. Findings suggest urinary tract infection, recommend correlation with urinalysis. Bladder wall thickening is also identified which could be related to infection as well though other etiologies including tumor are not excluded. Probable septated cyst within the liver; recommend sonographic characterization once clinical condition permits. Question small nodule the superior pole right kidney versus artifact, can be assessed at the time of non emergent abdominal ultrasound as well.   Original Report Authenticated By: Lollie Marrow, M.D.    Ct Chest Wo Contrast  06/22/2012  *RADIOLOGY REPORT*  Clinical Data:  Persistent fever, history stroke, Parkinson's disease, hypertension, coronary disease post MI and stenting, kidney stones, seizure, right parotid resection, gout  CT CHEST, ABDOMEN AND PELVIS WITHOUT CONTRAST  Technique:  Multidetector CT imaging of the chest, abdomen and pelvis was performed following the standard protocol without IV contrast.  IV contrast not utilized due to renal dysfunction. Dilute oral contrast.  Sagittal and coronal MPR images reconstructed from axial data set.  Comparison:  None  CT CHEST  Findings: Scattered atherosclerotic calcifications aorta and coronary arteries without aneurysm. Left arm PICC line, tip SVC. Small bilateral pleural effusions. Scattered normal-sized thoracic lymph nodes. Scattered areas of minimal linear subsegmental atelectasis bilaterally. No pulmonary infiltrate or pneumothorax. Multiple mild endplate compression fractures throughout the thoracic spine.  IMPRESSION: Small bilateral pleural effusions and minimal scattered atelectasis.  CT ABDOMEN AND PELVIS  Findings: Beam hardening artifacts from the patient's arms limit evaluation. Low  attenuation lesion within liver adjacent to gallbladder question cyst 4.5 x 4.6 cm, potentially containing a partial septation. Spleen, pancreas and adrenal glands grossly unremarkable. Question nodule at superior pole right kidney 2.4 x 1.8 cm versus artifact. Bilateral perinephric and peripelvic edema extending along ureters. Minimal collecting system dilatation with slight prominence of the proximal ureters though the distal ureters appear decompressed. Urinary tract infection not excluded. Bladder decompressed by Foley catheter of the wall appears thickened, potentially artifactual but cannot exclude other causes of wall thickening. Scattered atherosclerotic calcifications. Slight prominent stool in the rectosigmoid colon. Tiny umbilical hernia containing fat.  Stomach and bowel loops grossly unremarkable. No mass, adenopathy, or free  fluid. Mild scattered soft tissue edema including presacral. No discrete abscess collection, mass, adenopathy, or hernia. No acute lumbar spine abnormalities.  IMPRESSION: Limitations of exam secondary to lack of IV contrast and beam hardening artifacts. Mild dilatation of renal collecting systems and proximal ureters with perinephric, peripelvic and periureteral edema. No calculi are identified. Findings suggest urinary tract infection, recommend correlation with urinalysis. Bladder wall thickening is also identified which could be related to infection as well though other etiologies including tumor are not excluded. Probable septated cyst within the liver; recommend sonographic characterization once clinical condition permits. Question small nodule the superior pole right kidney versus artifact, can be assessed at the time of non emergent abdominal ultrasound as well.   Original Report Authenticated By: Lollie Marrow, M.D.     Medications:  Scheduled:    . antiseptic oral rinse  15 mL Mouth Rinse BID  . aspirin EC  81 mg Oral Daily  . atorvastatin  40 mg Oral q1800  .  carbidopa-levodopa  1 tablet Oral TID  . clopidogrel  75 mg Oral Daily  . docusate sodium  100 mg Oral BID  . enoxaparin (LOVENOX) injection  60 mg Subcutaneous Q24H  . feeding supplement  30 mL Oral BID BM  . feeding supplement  1 Container Oral BID BM  . levETIRAcetam  250 mg Oral BID  . multivitamin with minerals  1 tablet Oral Daily  . piperacillin-tazobactam (ZOSYN)  IV  3.375 g Intravenous Q8H  . potassium chloride  40 mEq Oral BID  . sodium chloride  10-40 mL Intracatheter Q12H  . thiamine  100 mg Oral Daily  . vancomycin  1,500 mg Intravenous Q24H   Continuous:    . 0.9 % NaCl with KCl 40 mEq / L 100 mL/hr at 06/23/12 0845   UJW:[id number], acetaminophen, albuterol, ondansetron (ZOFRAN) IV, ondansetron, oxyCODONE, sodium chloride  Assessment: Principal Problem:  *Pyelonephritis, acute Active Problems:  Sepsis  Hypotension  Bacteremia due to Escherichia coli  CAD (coronary artery disease)  HTN (hypertension)  Parkinson disease  FTT (failure to thrive) in adult  ARF (acute renal failure)  UTI (lower urinary tract infection)  Dehydration  Frequent falls  Hypokalemia  Anemia  Seizure disorder  Liver lesion    1. Escherichia coli acute pyelonephritis//Escherichia coli bacteremia/sepsis. He is on vancomycin and Zosyn. He was still spiking through these antibiotics up until today. He has now been afebrile for little less than 24 hours. His white blood cell count has improved. The Escherichia coli MC to Zosyn is 8. It is more sensitive to Cipro and cefepime. Of note, according to his wife, the patient had an in and out catheterization a couple of times during the previous hospitalization in September at [facility] for parotid gland excision. CT of his abdomen and pelvis yesterday revealed bilateral perinephric edema, suggestive of pyelonephritis, but will order an ultrasound to assess further. CT of his chest revealed no evidence of infection.  4.5 cm liver  lesion/mass. We'll order an ultrasound for further evaluation of cystic versus solid lesion.  Hypotension/Hypovolemia/dehydration.resolving on IV fluids. Status post IV dopamine for 2 days.  Acute renal failure, likely secondary to hypovolemia/prerenal azotemia/hypoperfusion. His urine output has been excellent. His creatinine is now starting to improve. His creatinine was 1.23 in August 2013. We'll continue IV fluids and monitor.  Severe hypokalemia. Resolved for now. Will continue supplementation as needed. His magnesium level was within normal limits.   Anemia. Etiology unknown, but is likely secondary to acute  infection, renal insufficiency, and in part, hemodilution from IV fluids. Based on the anemia panel, he has iron deficiency. We'll hold off on iron supplementation in the setting of sepsis.  Failure to thrive/frequent falls. This is likely secondary to the infection and advanced Parkinson's disease. His TSH is within normal limits. His RPR is negative. His vitamin B12 level is within normal limits. CT of his head negative for acute intracranial abnormalities. His wife desires skilled nursing facility placement for rehabilitation versus long-term for safety reasons and her inability to take care of him at home. The issue is insurance coverage or lack thereof. She is discussing options with the social worker daily. The physical therapist recommended skilled nursing facility placement.  Parkinson's disease. He is followed chronically by Dr. Gerilyn Pilgrim. He is treated chronically with Sinemet. This will be continued.  Seizure disorder. He is treated chronically with Keppra. We'll continue this.  Speech therapy consultation and recommendations noted and appreciated. We'll continue dysphagia 3 diet with thin liquids.   Plan:  1. We'll order an abdominal/renal ultrasound for further trifurcation of the renal abnormalities seen on the CT scan and liver lesion. 2. We'll discontinue Zosyn in favor  of Cipro. 3. Could discontinue vancomycin in the next day or 2. 4. We'll discontinue Foley catheter. We'll watch for urinary retention. 5. Decreased IV fluids. Monitor urine output and blood pressure closely. 6. We'll discontinue potassium chloride and continue to monitor serum potassium accordingly. 7. We'll transfer the patient to telemetry. Out of bed to the chair today.       LOS: 4 days   Gennavieve Huq 06/23/2012, 9:25 AM

## 2012-06-24 LAB — CBC
HCT: 26.7 % — ABNORMAL LOW (ref 39.0–52.0)
Hemoglobin: 9.2 g/dL — ABNORMAL LOW (ref 13.0–17.0)
RBC: 3.02 MIL/uL — ABNORMAL LOW (ref 4.22–5.81)
RDW: 14.5 % (ref 11.5–15.5)
WBC: 10.6 10*3/uL — ABNORMAL HIGH (ref 4.0–10.5)

## 2012-06-24 LAB — BASIC METABOLIC PANEL
Chloride: 109 mEq/L (ref 96–112)
GFR calc Af Amer: 40 mL/min — ABNORMAL LOW (ref >90)
Potassium: 4.5 mEq/L (ref 3.5–5.1)
Sodium: 138 mEq/L (ref 135–145)

## 2012-06-24 MED ORDER — DIPHENHYDRAMINE HCL 25 MG PO CAPS
25.0000 mg | ORAL_CAPSULE | Freq: Four times a day (QID) | ORAL | Status: DC | PRN
  Administered 2012-06-24 – 2012-06-25 (×2): 25 mg via ORAL
  Filled 2012-06-24 (×2): qty 1

## 2012-06-24 MED ORDER — POTASSIUM CHLORIDE IN NACL 40-0.9 MEQ/L-% IV SOLN
INTRAVENOUS | Status: DC
  Administered 2012-06-24 – 2012-06-25 (×2): via INTRAVENOUS

## 2012-06-24 MED ORDER — SODIUM CHLORIDE 0.9 % IJ SOLN
INTRAMUSCULAR | Status: AC
  Administered 2012-06-24: 10 mL
  Filled 2012-06-24: qty 6

## 2012-06-24 MED ORDER — CEFEPIME HCL 2 G IJ SOLR
2.0000 g | INTRAMUSCULAR | Status: DC
  Administered 2012-06-24: 2 g via INTRAVENOUS
  Filled 2012-06-24 (×4): qty 2

## 2012-06-24 NOTE — Progress Notes (Signed)
[facility]             Progress Note   Subjective: Patient seen in room, sitting up in chair, denies any complaints.  Informed by staff that patient has eruption of rash over his back.  He says that it is occasionally itching. Patient says he has had this for 3 weeks, but he appears to be unreliable.  Nursing feels that this rash was likely not present yesterday or it would be passed on in report.  Patient started on ciprofloxacin yesterday.  Objective: Vital signs in last 24 hours: Temp:  [98.5 F (36.9 C)-100.3 F (37.9 C)] 98.5 F (36.9 C) (10/18 0536) Pulse Rate:  [78-92] 78  (10/18 0536) Resp:  [18-19] 18  (10/18 0536) BP: (125-132)/(59-77) 125/77 mmHg (10/18 0536) SpO2:  [97 %] 97 % (10/18 0536) Weight:  [111.131 kg (245 lb)] 111.131 kg (245 lb) (10/18 0536) Weight change: 3.031 kg (6 lb 10.9 oz) Last BM Date: 06/23/12  Intake/Output from previous day: 10/17 0701 - 10/18 0700 In: 3847.5 [P.O.:1200; I.V.:1747.5; IV Piggyback:900] Out: 2980 [Urine:2978; Stool:2] Total I/O In: 236 [P.O.:236] Out: -    Physical Exam: General: Alert, awake, in no acute distress. HEENT: No bruits, no goiter. Heart: Regular rate and rhythm, without murmurs, rubs, gallops. Lungs: Clear to auscultation bilaterally. Abdomen: Soft, nontender, nondistended, positive bowel sounds. Extremities: No clubbing cyanosis or edema with positive pedal pulses. Neuro: Grossly intact, nonfocal. Skin:  Papular lesions noted diffusely over back   Lab Results: Basic Metabolic Panel:  Basename 06/24/12 0502 06/23/12 0433  NA 138 139  K 4.5 4.6  CL 109 111  CO2 21 21  GLUCOSE 92 98  BUN 23 28*  CREATININE 1.92* 2.12*  CALCIUM 8.6 8.2*  MG -- --  PHOS -- --   Liver Function Tests: No results found for this basename: AST:2,ALT:2,ALKPHOS:2,BILITOT:2,PROT:2,ALBUMIN:2 in the last 72 hours No results found for this basename: LIPASE:2,AMYLASE:2 in the last 72 hours No results found for  this basename: AMMONIA:2 in the last 72 hours CBC:  Basename 06/24/12 0502 06/23/12 0433  WBC 10.6* 10.7*  NEUTROABS -- --  HGB 9.2* 8.6*  HCT 26.7* 25.1*  MCV 88.4 88.7  PLT 262 243   Cardiac Enzymes: No results found for this basename: CKTOTAL:3,CKMB:3,CKMBINDEX:3,TROPONINI:3 in the last 72 hours BNP: No results found for this basename: PROBNP:3 in the last 72 hours D-Dimer: No results found for this basename: DDIMER:2 in the last 72 hours CBG: No results found for this basename: GLUCAP:6 in the last 72 hours Hemoglobin A1C: No results found for this basename: HGBA1C in the last 72 hours Fasting Lipid Panel: No results found for this basename: CHOL,HDL,LDLCALC,TRIG,CHOLHDL,LDLDIRECT in the last 72 hours Thyroid Function Tests: No results found for this basename: TSH,T4TOTAL,FREET4,T3FREE,THYROIDAB in the last 72 hours Anemia Panel: No results found for this basename: VITAMINB12,FOLATE,FERRITIN,TIBC,IRON,RETICCTPCT in the last 72 hours Coagulation: No results found for this basename: LABPROT:2,INR:2 in the last 72 hours Urine Drug Screen: Drugs of Abuse     Component Value Date/Time   LABOPIA NONE DETECTED 10/12/2008 1146   COCAINSCRNUR NONE DETECTED 10/12/2008 1146   LABBENZ NONE DETECTED 10/12/2008 1146   AMPHETMU NONE DETECTED 10/12/2008 1146   THCU NONE DETECTED 10/12/2008 1146   LABBARB  Value: NONE DETECTED        DRUG SCREEN FOR MEDICAL PURPOSES ONLY.  IF CONFIRMATION IS NEEDED FOR ANY PURPOSE, NOTIFY LAB WITHIN 5 DAYS.        LOWEST DETECTABLE LIMITS FOR URINE DRUG  SCREEN Drug Class       Cutoff (ng/mL) Amphetamine      1000 Barbiturate      200 Benzodiazepine   200 Tricyclics       300 Opiates          300 Cocaine          300 THC              50 10/12/2008 1146    Alcohol Level: No results found for this basename: ETH:2 in the last 72 hours Urinalysis: No results found for this basename:  COLORURINE:2,APPERANCEUR:2,LABSPEC:2,PHURINE:2,GLUCOSEU:2,HGBUR:2,BILIRUBINUR:2,KETONESUR:2,PROTEINUR:2,UROBILINOGEN:2,NITRITE:2,LEUKOCYTESUR:2 in the last 72 hours  Recent Results (from the past 240 hour(s))  URINE CULTURE     Status: Normal   Collection Time   06/19/12  7:18 PM      Component Value Range Status Comment   Specimen Description URINE, CATHETERIZED   Final    Special Requests NONE   Final    Culture  Setup Time 06/20/2012 01:22   Final    Colony Count >=100,000 COLONIES/ML   Final    Culture ESCHERICHIA COLI   Final    Report Status 06/22/2012 FINAL   Final    Organism ID, Bacteria ESCHERICHIA COLI   Final   CULTURE, BLOOD (ROUTINE X 2)     Status: Normal   Collection Time   06/19/12  7:36 PM      Component Value Range Status Comment   Specimen Description BLOOD RIGHT ARM   Final    Special Requests BOTTLES DRAWN AEROBIC AND ANAEROBIC 8CC   Final    Culture  Setup Time 06/21/2012 02:06   Final    Culture     Final    Value: ESCHERICHIA COLI     Note: SUSCEPTIBILITIES PERFORMED ON PREVIOUS CULTURE WITHIN THE LAST 5 DAYS.     Note: Gram Stain Report Called to,Read Back By and Verified With: KOGER L [id number] 06/20/12 BY Luetta Nutting M Performed at [facility]   Report Status 06/22/2012 FINAL   Final   CULTURE, BLOOD (ROUTINE X 2)     Status: Normal   Collection Time   06/20/12 12:16 AM      Component Value Range Status Comment   Specimen Description BLOOD LEFT ANTECUBITAL   Final    Special Requests BOTTLES DRAWN AEROBIC AND ANAEROBIC 8CC   Final    Culture  Setup Time 06/20/2012 13:40   Final    Culture     Final    Value: ESCHERICHIA COLI     1358 Note: Gram Stain Report Called to,Read Back By and Verified With: KOGER L 06/20/2012 [place]   Report Status 06/22/2012 FINAL   Final    Organism ID, Bacteria ESCHERICHIA COLI   Final   MRSA PCR SCREENING     Status: Normal   Collection Time   06/20/12  3:00 AM      Component Value Range Status Comment   MRSA by PCR  NEGATIVE  NEGATIVE Final     Studies/Results: Ct Abdomen Pelvis Wo Contrast  06/22/2012  *RADIOLOGY REPORT*  Clinical Data:  Persistent fever, history stroke, Parkinson's disease, hypertension, coronary disease post MI and stenting, kidney stones, seizure, right parotid resection, gout  CT CHEST, ABDOMEN AND PELVIS WITHOUT CONTRAST  Technique:  Multidetector CT imaging of the chest, abdomen and pelvis was performed following the standard protocol without IV contrast.  IV contrast not utilized due to renal dysfunction. Dilute oral contrast.  Sagittal and coronal MPR images reconstructed  from axial data set.  Comparison:  None  CT CHEST  Findings: Scattered atherosclerotic calcifications aorta and coronary arteries without aneurysm. Left arm PICC line, tip SVC. Small bilateral pleural effusions. Scattered normal-sized thoracic lymph nodes. Scattered areas of minimal linear subsegmental atelectasis bilaterally. No pulmonary infiltrate or pneumothorax. Multiple mild endplate compression fractures throughout the thoracic spine.  IMPRESSION: Small bilateral pleural effusions and minimal scattered atelectasis.  CT ABDOMEN AND PELVIS  Findings: Beam hardening artifacts from the patient's arms limit evaluation. Low attenuation lesion within liver adjacent to gallbladder question cyst 4.5 x 4.6 cm, potentially containing a partial septation. Spleen, pancreas and adrenal glands grossly unremarkable. Question nodule at superior pole right kidney 2.4 x 1.8 cm versus artifact. Bilateral perinephric and peripelvic edema extending along ureters. Minimal collecting system dilatation with slight prominence of the proximal ureters though the distal ureters appear decompressed. Urinary tract infection not excluded. Bladder decompressed by Foley catheter of the wall appears thickened, potentially artifactual but cannot exclude other causes of wall thickening. Scattered atherosclerotic calcifications. Slight prominent stool in the  rectosigmoid colon. Tiny umbilical hernia containing fat.  Stomach and bowel loops grossly unremarkable. No mass, adenopathy, or free fluid. Mild scattered soft tissue edema including presacral. No discrete abscess collection, mass, adenopathy, or hernia. No acute lumbar spine abnormalities.  IMPRESSION: Limitations of exam secondary to lack of IV contrast and beam hardening artifacts. Mild dilatation of renal collecting systems and proximal ureters with perinephric, peripelvic and periureteral edema. No calculi are identified. Findings suggest urinary tract infection, recommend correlation with urinalysis. Bladder wall thickening is also identified which could be related to infection as well though other etiologies including tumor are not excluded. Probable septated cyst within the liver; recommend sonographic characterization once clinical condition permits. Question small nodule the superior pole right kidney versus artifact, can be assessed at the time of non emergent abdominal ultrasound as well.   Original Report Authenticated By: Lollie Marrow, M.D.    Ct Chest Wo Contrast  06/22/2012  *RADIOLOGY REPORT*  Clinical Data:  Persistent fever, history stroke, Parkinson's disease, hypertension, coronary disease post MI and stenting, kidney stones, seizure, right parotid resection, gout  CT CHEST, ABDOMEN AND PELVIS WITHOUT CONTRAST  Technique:  Multidetector CT imaging of the chest, abdomen and pelvis was performed following the standard protocol without IV contrast.  IV contrast not utilized due to renal dysfunction. Dilute oral contrast.  Sagittal and coronal MPR images reconstructed from axial data set.  Comparison:  None  CT CHEST  Findings: Scattered atherosclerotic calcifications aorta and coronary arteries without aneurysm. Left arm PICC line, tip SVC. Small bilateral pleural effusions. Scattered normal-sized thoracic lymph nodes. Scattered areas of minimal linear subsegmental atelectasis bilaterally. No  pulmonary infiltrate or pneumothorax. Multiple mild endplate compression fractures throughout the thoracic spine.  IMPRESSION: Small bilateral pleural effusions and minimal scattered atelectasis.  CT ABDOMEN AND PELVIS  Findings: Beam hardening artifacts from the patient's arms limit evaluation. Low attenuation lesion within liver adjacent to gallbladder question cyst 4.5 x 4.6 cm, potentially containing a partial septation. Spleen, pancreas and adrenal glands grossly unremarkable. Question nodule at superior pole right kidney 2.4 x 1.8 cm versus artifact. Bilateral perinephric and peripelvic edema extending along ureters. Minimal collecting system dilatation with slight prominence of the proximal ureters though the distal ureters appear decompressed. Urinary tract infection not excluded. Bladder decompressed by Foley catheter of the wall appears thickened, potentially artifactual but cannot exclude other causes of wall thickening. Scattered atherosclerotic calcifications. Slight prominent stool in  the rectosigmoid colon. Tiny umbilical hernia containing fat.  Stomach and bowel loops grossly unremarkable. No mass, adenopathy, or free fluid. Mild scattered soft tissue edema including presacral. No discrete abscess collection, mass, adenopathy, or hernia. No acute lumbar spine abnormalities.  IMPRESSION: Limitations of exam secondary to lack of IV contrast and beam hardening artifacts. Mild dilatation of renal collecting systems and proximal ureters with perinephric, peripelvic and periureteral edema. No calculi are identified. Findings suggest urinary tract infection, recommend correlation with urinalysis. Bladder wall thickening is also identified which could be related to infection as well though other etiologies including tumor are not excluded. Probable septated cyst within the liver; recommend sonographic characterization once clinical condition permits. Question small nodule the superior pole right kidney  versus artifact, can be assessed at the time of non emergent abdominal ultrasound as well.   Original Report Authenticated By: Lollie Marrow, M.D.    US Abdomen Complete  06/23/2012  *RADIOLOGY REPORT*  Clinical Data:  Evaluate liver lesion  COMPLETE ABDOMINAL ULTRASOUND  Comparison:  CT chest, abdomen and pelvis - 06/22/2012  Findings:  Examination degraded secondary to patient body habitus.  Gallbladder:  Sonographically normal.  No echogenic gallstones or gall sludge.  No gallbladder wall thickening or pericholecystic fluid.  Negative sonographic Murphy's sign.  Common bile duct:  Normal in size measuring 3.8 mm in diameter  Liver:  Adjacent to the gallbladder fossa within the medial segment left lobe the liver is an approximately 3.6 x 4.1 x 4.2 cm anechoic lesion with internal septations which corresponds to the lesion detected on recently performed noncontrast abdominal CT.  This lesion demonstrates increased through transmission and is without definitive internal blood flow.  No additional liver lesions are seen.  No definite intrahepatic biliary ductal dilatation.  No ascites.  IVC:  Appears normal.  Pancreas:  Obscured by overlying bowel gas.  Spleen:  Normal in size measuring 10.7 cm in length.  Right Kidney:  Normal cortical thickness, echogenicity and size, measuring 13.3 cm in length.  The described possible hyperattenuating exophytic lesion arising from the superior pole right kidney on recent abdominal CT is not definitively depicted on this examination.  No echogenic renal stones.  There is mild dilatation of the right renal pelvis (image 51) without definite caliectasis.  Left Kidney:  Normal cortical thickness, echogenicity and size, measuring 11.1 cm in length.  No focal renal lesions.  No echogenic renal stones.  No urinary obstruction.  Abdominal aorta:  No aneurysm identified.  Incidental note is made of a small right-sided pleural effusion (image 33).  IMPRESSION: 1.  Approximately 4.2 cm  cystic lesion within the medial segment of the left lobe of the liver correlates with the findings are recently performed abdominal CT and is favored to represent a minimally complex hepatic cyst. Comparison with prior examinations (if available) is recommended.  If no comparison exams, further evaluation with repeat abdominal ultrasound in 6 months is recommended to ensure continued stability.  Alternatively, definitive characterization could be performed with a contrast enhanced abdominal MRI or CT as indicated. 2. Unchanged prominence of the right renal pelvis without definite caliectasis, favored to represent an extrarenal pelvis.  No left- sided urinary obstruction.  3. The described possible exophytic hyperattenuating lesion arising in the superior pole right kidney on recent abdominal CT is not definitively depicted on this ultrasound.  4.  Incidental note is made of a small right-sided pleural effusion.   Original Report Authenticated By: Judene Companion.D.  Medications: Scheduled Meds:   . antiseptic oral rinse  15 mL Mouth Rinse BID  . aspirin EC  81 mg Oral Daily  . atorvastatin  40 mg Oral q1800  . carbidopa-levodopa  1 tablet Oral TID  . ciprofloxacin  400 mg Intravenous Q12H  . clopidogrel  75 mg Oral Daily  . docusate sodium  100 mg Oral BID  . enoxaparin (LOVENOX) injection  60 mg Subcutaneous Q24H  . feeding supplement  30 mL Oral BID BM  . feeding supplement  1 Container Oral BID BM  . levETIRAcetam  250 mg Oral BID  . multivitamin with minerals  1 tablet Oral Daily  . sodium chloride  10-40 mL Intracatheter Q12H  . sodium chloride      . thiamine  100 mg Oral Daily  . vancomycin  1,500 mg Intravenous Q24H   Continuous Infusions:   . 0.9 % NaCl with KCl 40 mEq / L 70 mL/hr at 06/23/12 2227   PRN Meds:.acetaminophen, acetaminophen, albuterol, ondansetron (ZOFRAN) IV, ondansetron, oxyCODONE, sodium chloride  Assessment/Plan:  Principal Problem:  *Pyelonephritis,  acute Active Problems:  CAD (coronary artery disease)  HTN (hypertension)  Parkinson disease  FTT (failure to thrive) in adult  ARF (acute renal failure)  UTI (lower urinary tract infection)  Dehydration  Frequent falls  Hypokalemia  Anemia  Seizure disorder  Sepsis  Hypotension  Bacteremia due to Escherichia coli  Liver lesion  1. E coli sepsis/pyelonephritis.  Patient had been on vancomycin and zosyn initially.  This was changed to ciprofloxacin yesterday based on sensitivities. Patient will need a total of 2 weeks of IV antibiotics.  PICC line is already in places.  Will change cipro to cefepime, since skin reaction may have been due to cipro.  Will discontinue vancomycin.  2. Acute renal failure. Improving with IV fluids, continue to monitor  3. Anemia, stable  4. Liver lesion, felt to be hepatic cyst.  Will need repeat ultrasound in 6 months.  No renal lesion seen on ultrasound.  5. Deconditioning.  Awaits SNF placement.  Will likely be able to discharge in next 24-48 hours.  Time spent coordinating care:   LOS: 5 days   MEMON,JEHANZEB [facility] Pager: [phone] 06/24/2012, 11:05 AM

## 2012-06-24 NOTE — Progress Notes (Signed)
Physical Therapy Treatment Patient Details Name: Mark [name] MRN: [id number] DOB: 12/29/1945 Today's Date: 06/24/2012 Time: 7628-3151 PT Time Calculation (min): 37 min  PT Assessment / Plan / Recommendation Comments on Treatment Session  Pt with improved mobility today but still fatigues quickly with activity. PT is very cooperative and eager to participate in therapy.       Does the patient have the potential to tolerate intense rehabilitation  No, Recommend SNF           Frequency Min 3X/week   Plan Discharge plan remains appropriate    Precautions / Restrictions Precautions Precautions: Fall   Pertinent Vitals/Pain 0    Mobility  Bed Mobility Details for Bed Mobility Assistance: Pt already in chair when therapist came to work with pt.  Bed mobility was not addressed at this session. Transfers Transfers: Sit to Stand Sit to Stand: 2: Max assist Details for Transfer Assistance: Pt came sit to stand x 5 reps to work on mobility.  Pt improved with each attempt.  Standing pt worked on marching in place for balance and weight shifting. Ambulation/Gait Ambulation/Gait Assistance: Not tested (comment)    Exercises General Exercises - Lower Extremity Ankle Circles/Pumps: AAROM;Both;10 reps Quad Sets: AROM;Both;10 reps Gluteal Sets: AROM;Both;10 reps Heel Slides: AAROM;Both;10 reps Hip ABduction/ADduction: AAROM;Both;10 reps Hip Flexion/Marching: Standing;Both;10 reps   PT Diagnosis:   decreased mobility; difficulty walking PT Problem List:   PT Treatment Interventions:   There ex, there activity will begin gt.  PT Goals    Visit Information  Last PT Received On: 06/24/12 Assistance Needed: +1    Subjective Data  Subjective: Pt states he has no complaints Patient Stated Goal: Pt would like to be able to move better   Cognition  Overall Cognitive Status: Appears within functional limits for tasks assessed/performed    Balance   poor  End of Session PT - End  of Session Equipment Utilized During Treatment: Gait belt Activity Tolerance: Patient limited by fatigue Patient left: in chair;with call bell/phone within reach   GP     RUSSELL,CINDY 06/24/2012, 11:01 AM

## 2012-06-24 NOTE — Progress Notes (Addendum)
Nutrition Follow-up  Intervention:  Cont with current nutrition plan  Assessment:   Pt po intake improved; tolerating diet. Severe wt increase 6kg (14#, 6%) x 4 days. Pt on daily wt monitoring.  Diet Order: Dys 3 /thin,  po's 75%  Meds: Scheduled Meds:   . antiseptic oral rinse  15 mL Mouth Rinse BID  . aspirin EC  81 mg Oral Daily  . atorvastatin  40 mg Oral q1800  . carbidopa-levodopa  1 tablet Oral TID  . ciprofloxacin  400 mg Intravenous Q12H  . clopidogrel  75 mg Oral Daily  . docusate sodium  100 mg Oral BID  . enoxaparin (LOVENOX) injection  60 mg Subcutaneous Q24H  . feeding supplement  30 mL Oral BID BM  . feeding supplement  1 Container Oral BID BM  . levETIRAcetam  250 mg Oral BID  . multivitamin with minerals  1 tablet Oral Daily  . sodium chloride  10-40 mL Intracatheter Q12H  . sodium chloride      . thiamine  100 mg Oral Daily  . vancomycin  1,500 mg Intravenous Q24H   Continuous Infusions:   . 0.9 % NaCl with KCl 40 mEq / L 70 mL/hr at 06/23/12 2227   PRN Meds:.acetaminophen, acetaminophen, albuterol, ondansetron (ZOFRAN) IV, ondansetron, oxyCODONE, sodium chloride  Labs:  CMP     Component Value Date/Time   NA 138 06/24/2012 0502   K 4.5 06/24/2012 0502   CL 109 06/24/2012 0502   CO2 21 06/24/2012 0502   GLUCOSE 92 06/24/2012 0502   BUN 23 06/24/2012 0502   CREATININE 1.92* 06/24/2012 0502   CALCIUM 8.6 06/24/2012 0502   PROT 5.6* 06/20/2012 0414   ALBUMIN 2.4* 06/20/2012 0414   AST 21 06/20/2012 0414   ALT 12 06/20/2012 0414   ALKPHOS 44 06/20/2012 0414   BILITOT 0.4 06/20/2012 0414   GFRNONAA 35* 06/24/2012 0502   GFRAA 40* 06/24/2012 0502     Intake/Output Summary (Last 24 hours) at 06/24/12 1010 Last data filed at 06/24/12 0855  Gross per 24 hour  Intake 3063.5 ml  Output   1855 ml  Net 1208.5 ml    Weight Status: Wt Readings from Last 10 Encounters:  06/24/12 245 lb (111.131 kg)  05/13/12 233 lb 7.5 oz (105.9 kg)  05/13/12 233  lb 7.5 oz (105.9 kg)  04/29/12 224 lb 8 oz (101.833 kg)  04/05/12 232 lb (105.235 kg)  03/04/12 225 lb (102.059 kg)  12/29/11 225 lb (102.059 kg)  12/29/11 225 lb (102.059 kg)  12/28/11 224 lb 13.9 oz (102 kg)  12/19/11 225 lb (102.059 kg)    Estimated Nutritional Needs:  Kcal: 2200-2400 kcal  Protein:105-126 gr  Fluid:1 ml/kcal   NUTRITION DIAGNOSIS:  -Inadequate oral intake (NI-2.1). Status:improving MONITORING/EVALUATION(Goals):  Monitor pt po intake food and fluids, results of ST eval, skin assessments  Goal: Pt to meet >/= 90% of their estimated nutrition needs; progressing   [phone]

## 2012-06-24 NOTE — Clinical Social Work Note (Signed)
[facility] and [employer] notified of plan for patient to discharge on 2 weeks of IV antibiotics.  [facility] cannot accept patient on IV antibiotics.  Santa Genera, LCSW Clinical Social Worker [phone])

## 2012-06-24 NOTE — Clinical Social Work Note (Signed)
Spoke w Charlynn Court at [facility], case is still under review for SNF authorization.  Provided today's PT notes to [facility] for consideration.  If patient receives authorization for SNF, [facility] is able to accept when medically ready for discharge.  Patient also has pending bed offer at [facility], Darel Hong from [facility] will come this evening to reassess patient and determine whether he is appropriate for placement at ALF at discharge.  Patient and wife are aware of and agreeable to both options under review.  Wife continues to state the she is unable to care for patient at home.  Santa Genera, LCSW Clinical Social Worker [phone])

## 2012-06-24 NOTE — Progress Notes (Signed)
ANTIBIOTIC CONSULT NOTE   Pharmacy Consult for Cefepime Indication: urosepsis  No Known Allergies  Patient Measurements: Height: 6' (182.9 cm) Weight: 245 lb (111.131 kg) IBW/kg (Calculated) : 77.6   Vital Signs: Temp: 98.5 F (36.9 C) (10/18 0536) Temp src: Oral (10/18 0536) BP: 125/77 mmHg (10/18 0536) Pulse Rate: 78  (10/18 0536) Intake/Output from previous day: 10/17 0701 - 10/18 0700 In: 3847.5 [P.O.:1200; I.V.:1747.5; IV Piggyback:900] Out: 2980 [Urine:2978; Stool:2] Intake/Output from this shift: Total I/O In: 236 [P.O.:236] Out: -   Labs:  Basename 06/24/12 0502 06/23/12 0433 06/22/12 0437  WBC 10.6* 10.7* 10.9*  HGB 9.2* 8.6* 8.8*  PLT 262 243 221  LABCREA -- -- --  CREATININE 1.92* 2.12* 2.50*   Estimated Creatinine Clearance: 48.7 ml/min (by C-G formula based on Cr of 1.92). No results found for this basename: VANCOTROUGH:2,VANCOPEAK:2,VANCORANDOM:2,GENTTROUGH:2,GENTPEAK:2,GENTRANDOM:2,TOBRATROUGH:2,TOBRAPEAK:2,TOBRARND:2,AMIKACINPEAK:2,AMIKACINTROU:2,AMIKACIN:2, in the last 72 hours   Microbiology: Recent Results (from the past 720 hour(s))  URINE CULTURE     Status: Normal   Collection Time   06/19/12  7:18 PM      Component Value Range Status Comment   Specimen Description URINE, CATHETERIZED   Final    Special Requests NONE   Final    Culture  Setup Time 06/20/2012 01:22   Final    Colony Count >=100,000 COLONIES/ML   Final    Culture ESCHERICHIA COLI   Final    Report Status 06/22/2012 FINAL   Final    Organism ID, Bacteria ESCHERICHIA COLI   Final   CULTURE, BLOOD (ROUTINE X 2)     Status: Normal   Collection Time   06/19/12  7:36 PM      Component Value Range Status Comment   Specimen Description BLOOD RIGHT ARM   Final    Special Requests BOTTLES DRAWN AEROBIC AND ANAEROBIC 8CC   Final    Culture  Setup Time 06/21/2012 02:06   Final    Culture     Final    Value: ESCHERICHIA COLI     Note: SUSCEPTIBILITIES PERFORMED ON PREVIOUS CULTURE  WITHIN THE LAST 5 DAYS.     Note: Gram Stain Report Called to,Read Back By and Verified With: KOGER L 1358 06/20/12 BY Luetta Nutting M Performed at [facility]   Report Status 06/22/2012 FINAL   Final   CULTURE, BLOOD (ROUTINE X 2)     Status: Normal   Collection Time   06/20/12 12:16 AM      Component Value Range Status Comment   Specimen Description BLOOD LEFT ANTECUBITAL   Final    Special Requests BOTTLES DRAWN AEROBIC AND ANAEROBIC 8CC   Final    Culture  Setup Time 06/20/2012 13:40   Final    Culture     Final    Value: ESCHERICHIA COLI     1358 Note: Gram Stain Report Called to,Read Back By and Verified With: KOGER L 06/20/2012 [facility]   Report Status 06/22/2012 FINAL   Final    Organism ID, Bacteria ESCHERICHIA COLI   Final   MRSA PCR SCREENING     Status: Normal   Collection Time   06/20/12  3:00 AM      Component Value Range Status Comment   MRSA by PCR NEGATIVE  NEGATIVE Final    Culture, blood (routine x 2) Status: Final result MyChart: Not Released         Component  Value    Specimen Description  BLOOD LEFT ANTECUBITAL    Special Requests  BOTTLES  DRAWN AEROBIC AND ANAEROBIC 8CC    Culture Setup Time  06/20/2012 13:40    Culture  ESCHERICHIA COLI 1358 Note: Gram Stain Report Called to,Read Back By and Verified With: KOGER L 06/20/2012 [place]    Report Status  06/22/2012 FINAL    Organism ID, Bacteria  ESCHERICHIA COLI    Resulting Agency  [facility]     Culture & Susceptibility     ESCHERICHIA COLI          Antibiotic  Sensitivity  Microscan  Status      AMPICILLIN  Resistant  >=32  Final      Method:  MIC      AMPICILLIN/SULBACTAM  Resistant  >=32  Final      Method:  MIC      CEFAZOLIN  Resistant  >=64  Final      Method:  MIC      CEFEPIME  Sensitive  <=1  Final      Method:  MIC      CEFOXITIN  Resistant  >=64  Final      Method:  MIC      CEFTAZIDIME  Intermediate  16  Final      Method:  MIC      CEFTRIAXONE  Intermediate  16  Final       Method:  MIC      CIPROFLOXACIN  Sensitive  <=0.25  Final      Method:  MIC      GENTAMICIN  Sensitive  <=1  Final      Method:  MIC      IMIPENEM  Sensitive  <=0.25  Final      Method:  MIC      PIP/TAZO  Sensitive  8  Final      Method:  MIC      TOBRAMYCIN  Sensitive  <=1  Final      Method:  MIC      TRIMETH/SULFA  Sensitive  <=20  Final      Method:  MIC       Comments  ESCHERICHIA COLI (MIC)        ESCHERICHIA COLI             Medical History: Past Medical History  Diagnosis Date  . Hypertension   . PAF (paroxysmal atrial fibrillation)   . Parkinson's disease   . Gout   . Coronary artery disease     SEVERE 3-VESSEL DX WITH NORMAL EF  . Fall   . Fracture of right olecranon process 10/23/2011  . Stones in the urinary tract   . Arthritis   . Stroke 10/12    Hx of remote stroke, RT SIDED WEAKNESS  . Non-ST elevated myocardial infarction (non-STEMI)   . MI (myocardial infarction) 08/2011    /H&P (05/12/2012)  . Sleep apnea   . Seizures 12/10/2011    HAD SEIZURE  . Skin cancer     left shoulder  . Bacteremia due to Escherichia coli 06/21/2012  . Pyelonephritis, acute 06/23/2012  . 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   Medications:  Scheduled:     . antiseptic oral rinse  15 mL Mouth Rinse BID  . aspirin EC  81 mg Oral Daily  . atorvastatin  40 mg Oral q1800  . carbidopa-levodopa  1 tablet Oral TID  . clopidogrel  75 mg Oral Daily  . docusate sodium  100 mg Oral BID  . enoxaparin (LOVENOX) injection  60 mg Subcutaneous  Q24H  . feeding supplement  30 mL Oral BID BM  . feeding supplement  1 Container Oral BID BM  . levETIRAcetam  250 mg Oral BID  . multivitamin with minerals  1 tablet Oral Daily  . sodium chloride  10-40 mL Intracatheter Q12H  . sodium chloride      . thiamine  100 mg Oral Daily  . DISCONTD: ciprofloxacin  400 mg Intravenous Q12H  . DISCONTD: vancomycin  1,500 mg Intravenous Q24H   Assessment: 66yo obese male on day#5 Vancomycin & day#2 Cipro  for +Ecoli urosepsis. Cultures and sensitivities noted as above.  He is now afebrile and WBC trending down.  ARF noted- some improvement in Scr today. Patient has developed rash on back & at [facility] site. Noted MD plans to change abx to Cefepime & continue IV abx  for 2 weeks.  Goal of Therapy:  Eradicate infection.  Plan: Cefepime 2gm IV Q24h Monitor labs, renal fxn, and cultures per protocol  Fraidy Mccarrick, [facility] 06/24/2012,12:23 PM

## 2012-06-24 NOTE — Clinical Social Work Note (Signed)
Case under review for [facility] authorization for SNF benefits for placement at [facility].  Santa Genera, LCSW Clinical Social Worker [phone])

## 2012-06-24 NOTE — Progress Notes (Signed)
Noted to have bleeding from right nostril during rounding. Bleeding stopped at present. Also noted to have large amount of watery stool in bed. Cleaned and changed. (see daily cares)

## 2012-06-24 NOTE — Clinical Social Work Note (Signed)
CSW spoke with pt's wife regarding update on SNF as pt was asleep. She provided password. [facility] received [employer] authorization for SNF and are aware of need for 2 weeks of IV antibiotics. Pt's wife agreeable. MD notified. Potential d/c this weekend per MD. [facility] can accept pt either day if medically stable.   Derenda Fennel, [place] [phone]

## 2012-06-25 LAB — BASIC METABOLIC PANEL
BUN: 21 mg/dL (ref 6–23)
CO2: 22 mEq/L (ref 19–32)
Chloride: 110 mEq/L (ref 96–112)
GFR calc Af Amer: 45 mL/min — ABNORMAL LOW (ref >90)
Glucose, Bld: 93 mg/dL (ref 70–99)
Potassium: 4.2 mEq/L (ref 3.5–5.1)

## 2012-06-25 LAB — CBC
HCT: 25.1 % — ABNORMAL LOW (ref 39.0–52.0)
Hemoglobin: 8.4 g/dL — ABNORMAL LOW (ref 13.0–17.0)
MCHC: 33.5 g/dL (ref 30.0–36.0)

## 2012-06-25 MED ORDER — HYDROCODONE-ACETAMINOPHEN 7.5-325 MG PO TABS: 1.0000 | ORAL_TABLET | Freq: Four times a day (QID) | ORAL | Status: DC | PRN

## 2012-06-25 MED ORDER — POLYSACCHARIDE IRON COMPLEX 150 MG PO CAPS: 150.0000 mg | ORAL_CAPSULE | Freq: Two times a day (BID) | ORAL | Status: DC

## 2012-06-25 MED ORDER — HEPARIN SOD (PORK) LOCK FLUSH 100 UNIT/ML IV SOLN
250.0000 [IU] | Freq: Every day | INTRAVENOUS | Status: DC
  Filled 2012-06-25: qty 5

## 2012-06-25 MED ORDER — DEXTROSE 5 % IV SOLN: 2.0000 g | Freq: Two times a day (BID) | INTRAVENOUS | Status: DC

## 2012-06-25 MED ORDER — HEPARIN SOD (PORK) LOCK FLUSH 100 UNIT/ML IV SOLN: 250.0000 [IU] | INTRAVENOUS | Status: DC | PRN

## 2012-06-25 MED ORDER — SODIUM CHLORIDE 0.9 % IJ SOLN
INTRAMUSCULAR | Status: AC
  Filled 2012-06-25: qty 6

## 2012-06-25 MED ORDER — DEXTROSE 5 % IV SOLN
2.0000 g | Freq: Two times a day (BID) | INTRAVENOUS | Status: DC
  Administered 2012-06-25: 2 g via INTRAVENOUS
  Filled 2012-06-25 (×5): qty 2

## 2012-06-25 NOTE — Progress Notes (Signed)
ANTIBIOTIC CONSULT NOTE   Pharmacy Consult for Cefepime Indication: urosepsis  No Known Allergies  Patient Measurements: Height: 6' (182.9 cm) Weight: 245 lb (111.131 kg) IBW/kg (Calculated) : 77.6   Vital Signs: Temp: 98.7 F (37.1 C) (10/19 0500) Temp src: Oral (10/19 0500) BP: 139/78 mmHg (10/19 0500) Pulse Rate: 74  (10/19 0500) Intake/Output from previous day: 10/18 0701 - 10/19 0700 In: 936.8 [P.O.:476; I.V.:460.8] Out: 600 [Urine:600] Intake/Output from this shift: Total I/O In: 511 [I.V.:511] Out: -   Labs:  Basename 06/25/12 0620 06/24/12 0502 06/23/12 0433  WBC 11.0* 10.6* 10.7*  HGB 8.4* 9.2* 8.6*  PLT 273 262 243  LABCREA -- -- --  CREATININE 1.75* 1.92* 2.12*   Estimated Creatinine Clearance: 53.4 ml/min (by C-G formula based on Cr of 1.75). No results found for this basename: VANCOTROUGH:2,VANCOPEAK:2,VANCORANDOM:2,GENTTROUGH:2,GENTPEAK:2,GENTRANDOM:2,TOBRATROUGH:2,TOBRAPEAK:2,TOBRARND:2,AMIKACINPEAK:2,AMIKACINTROU:2,AMIKACIN:2, in the last 72 hours   Microbiology: Recent Results (from the past 720 hour(s))  URINE CULTURE     Status: Normal   Collection Time   06/19/12  7:18 PM      Component Value Range Status Comment   Specimen Description URINE, CATHETERIZED   Final    Special Requests NONE   Final    Culture  Setup Time 06/20/2012 01:22   Final    Colony Count >=100,000 COLONIES/ML   Final    Culture ESCHERICHIA COLI   Final    Report Status 06/22/2012 FINAL   Final    Organism ID, Bacteria ESCHERICHIA COLI   Final   CULTURE, BLOOD (ROUTINE X 2)     Status: Normal   Collection Time   06/19/12  7:36 PM      Component Value Range Status Comment   Specimen Description BLOOD RIGHT ARM   Final    Special Requests BOTTLES DRAWN AEROBIC AND ANAEROBIC 8CC   Final    Culture  Setup Time 06/21/2012 02:06   Final    Culture     Final    Value: ESCHERICHIA COLI     Note: SUSCEPTIBILITIES PERFORMED ON PREVIOUS CULTURE WITHIN THE LAST 5 DAYS.   Note: Gram Stain Report Called to,Read Back By and Verified With: KOGER L 1358 06/20/12 BY Luetta Nutting M Performed at [facility]   Report Status 06/22/2012 FINAL   Final   CULTURE, BLOOD (ROUTINE X 2)     Status: Normal   Collection Time   06/20/12 12:16 AM      Component Value Range Status Comment   Specimen Description BLOOD LEFT ANTECUBITAL   Final    Special Requests BOTTLES DRAWN AEROBIC AND ANAEROBIC 8CC   Final    Culture  Setup Time 06/20/2012 13:40   Final    Culture     Final    Value: ESCHERICHIA COLI     1358 Note: Gram Stain Report Called to,Read Back By and Verified With: KOGER L 06/20/2012 [facility]   Report Status 06/22/2012 FINAL   Final    Organism ID, Bacteria ESCHERICHIA COLI   Final   MRSA PCR SCREENING     Status: Normal   Collection Time   06/20/12  3:00 AM      Component Value Range Status Comment   MRSA by PCR NEGATIVE  NEGATIVE Final    Culture, blood (routine x 2) Status: Final result MyChart: Not Released         Component  Value    Specimen Description  BLOOD LEFT ANTECUBITAL    Special Requests  BOTTLES DRAWN AEROBIC AND ANAEROBIC 8CC  Culture Setup Time  06/20/2012 13:40    Culture  ESCHERICHIA COLI 1358 Note: Gram Stain Report Called to,Read Back By and Verified With: KOGER L 06/20/2012 [place]    Report Status  06/22/2012 FINAL    Organism ID, Bacteria  ESCHERICHIA COLI    Resulting Agency  [facility]     Culture & Susceptibility     ESCHERICHIA COLI          Antibiotic  Sensitivity  Microscan  Status      AMPICILLIN  Resistant  >=32  Final      Method:  MIC      AMPICILLIN/SULBACTAM  Resistant  >=32  Final      Method:  MIC      CEFAZOLIN  Resistant  >=64  Final      Method:  MIC      CEFEPIME  Sensitive  <=1  Final      Method:  MIC      CEFOXITIN  Resistant  >=64  Final      Method:  MIC      CEFTAZIDIME  Intermediate  16  Final      Method:  MIC      CEFTRIAXONE  Intermediate  16  Final      Method:  MIC      CIPROFLOXACIN   Sensitive  <=0.25  Final      Method:  MIC      GENTAMICIN  Sensitive  <=1  Final      Method:  MIC      IMIPENEM  Sensitive  <=0.25  Final      Method:  MIC      PIP/TAZO  Sensitive  8  Final      Method:  MIC      TOBRAMYCIN  Sensitive  <=1  Final      Method:  MIC      TRIMETH/SULFA  Sensitive  <=20  Final      Method:  MIC       Comments  ESCHERICHIA COLI (MIC)        ESCHERICHIA COLI             Medical History: Past Medical History  Diagnosis Date  . Hypertension   . PAF (paroxysmal atrial fibrillation)   . Parkinson's disease   . Gout   . Coronary artery disease     SEVERE 3-VESSEL DX WITH NORMAL EF  . Fall   . Fracture of right olecranon process 10/23/2011  . Stones in the urinary tract   . Arthritis   . Stroke 10/12    Hx of remote stroke, RT SIDED WEAKNESS  . Non-ST elevated myocardial infarction (non-STEMI)   . MI (myocardial infarction) 08/2011    /H&P (05/12/2012)  . Sleep apnea   . Seizures 12/10/2011    HAD SEIZURE  . Skin cancer     left shoulder  . Bacteremia due to Escherichia coli 06/21/2012  . Pyelonephritis, acute 06/23/2012  . 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   Medications:  Scheduled:     . antiseptic oral rinse  15 mL Mouth Rinse BID  . aspirin EC  81 mg Oral Daily  . atorvastatin  40 mg Oral q1800  . carbidopa-levodopa  1 tablet Oral TID  . ceFEPime (MAXIPIME) IV  2 g Intravenous Q24H  . clopidogrel  75 mg Oral Daily  . docusate sodium  100 mg Oral BID  . enoxaparin (LOVENOX) injection  60  mg Subcutaneous Q24H  . feeding supplement  30 mL Oral BID BM  . feeding supplement  1 Container Oral BID BM  . levETIRAcetam  250 mg Oral BID  . multivitamin with minerals  1 tablet Oral Daily  . sodium chloride  10-40 mL Intracatheter Q12H  . sodium chloride      . thiamine  100 mg Oral Daily  . DISCONTD: ciprofloxacin  400 mg Intravenous Q12H  . DISCONTD: vancomycin  1,500 mg Intravenous Q24H   Assessment: 66yo obese male on day#2 Cefepime  for +Ecoli urosepsis. Cultures and sensitivities noted as above.  He is now afebrile and WBC trending down.  His renal function continues to improve and is now >54ml/min. Noted plans to d/c to nursing facility for 2 weeks IV antibiotics.   Goal of Therapy:  Eradicate infection.  Plan: Increase Cefepime 2gm IV Q12h for improving renal function- would recommend d/c on this dose.  Monitor labs, renal fxn, and cultures per protocol  Shantrice Rodenberg, [facility] 06/25/2012,8:37 AM

## 2012-06-25 NOTE — Progress Notes (Signed)
Pt. Discharged to [facility] via stretcher/EMS.  No acute distress noted.

## 2012-06-25 NOTE — Discharge Summary (Signed)
Physician Discharge Summary  YASIR KITNER ZOX:[id number] DOB: 1946/06/06 DOA: 06/19/2012  PCP: Geraldo Pitter, MD  Admit date: 06/19/2012 Discharge date: 06/25/2012  Recommendations for Outpatient Follow-up:  1. Patient will be sent to [facility] for continued outpatient care. 2. Picc line can be discontinued once antibiotics complete 3. Needs ultrasound of liver in 6 months to monitor hepatic cyst 4. Repeat CBC and serum chemistries in 1 week to ensure stability  Discharge Diagnoses:  Principal Problem:  *Pyelonephritis, acute Active Problems:  CAD (coronary artery disease)  HTN (hypertension)  Parkinson disease  FTT (failure to thrive) in adult  ARF (acute renal failure)  UTI (lower urinary tract infection)  Dehydration  Frequent falls  Hypokalemia  Anemia  Seizure disorder  Sepsis  Hypotension  Bacteremia due to Escherichia coli  Liver lesion   Discharge Condition: improved  Diet recommendation: low salt, dysphagia 3 with thin liquids  Filed Weights   06/22/12 0500 06/23/12 0500 06/24/12 0536  Weight: 115.1 kg (253 lb 12 oz) 108.1 kg (238 lb 5.1 oz) 111.131 kg (245 lb)    History of present illness:  This is a 66 year old, Caucasian male, with a past medical history of Parkinson's disease, coronary artery disease, seizure disorder who lives with his wife. Patient has some memory issues and cognitive impairment and, so, not much history was available from him. His wife is at the bedside and she was able to provide much of the history. According to the wife patient's Parkinson's disease has been getting worse over the last one year. He has required assistance with activities of daily living. He's had frequent falls. He had a fall most recently on Monday, when he bumped his head. He requires a lot of assistance getting up and getting into the car. He doesn't walk much. And, in the last 2 weeks all of this has gotten worse. His oral intake has decreased. About 2  weeks ago patient was found to have leg swelling. So, he was taken to his primary care physician's office and was prescribed furosemide, as well as allopurinol. The swelling has improved since then. And there is no history of fever or chills. No history nausea, vomiting. He does have a history of constipation. His oral intake has been very poor in the last few days. He was not having any fluids as well. He also has a history of right arm weakness, which has been ongoing for many years. Patient is mainly complaining of back pain, which started ever since he has been lying on the stretcher in the emergency department. There is no history of fall today. His wife is extremely exhausted from all care she has been providing to him for the last 2 years and she is unable to care for him any further. He underwent right superficial parotidectomy on September 5.   Hospital Course:  This gentleman was brought into the hospital with acute renal failure and severe sepsis. He was found to have an acute pyelonephritis.  Blood cultures were positive for E coli.  Patient was found to have severe sepsis requiring IV fluid resuscitation and was briefly on dopamine.  With antibiotic therapy, his fevers resolved and blood pressures stabilized.  He will be given a total of 2 weeks of IV antibiotics to treat his bacteremia.   Patient was also found to have acute renal failure which was likely multifactorial due to sepsis/ARB/HCTZ/dehydration.  With IV fluid resuscitation, His renal function has improved significantly and he has had excellent urine output.  This can be further followed in the outpatient setting.  Patient was noted to have a liver lesion.  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  Follow up with a repeat ultrasound in 6 months for surveillance has been recommended.  Patient was noted to be profoundly hypokalemic on admission.  This was likely due to HCTZ and has since been corrected  Regarding HTN,  I have restarted coreg and norvasc.  We will continue to hold ARB/HCTZ for now until his renal function further recovers.  These can be restarted in the outpatient setting as felt appropriate. Repeat serum chemistries in one week were recommended to follow up on renal function.  Anemia, felt to be due to iron deficiency and acute illness.  Will start the patient on oral iron therapy.  This can be further addressed in the outpatient setting.  Hemoglobin is stable.  Patient will be sent to a skilled nursing facility for further care. PICC line can be removed once antibiotic course is complete.  Procedures:  none  Consultations:  none  Discharge Exam: Filed Vitals:   06/24/12 0536 06/24/12 1300 06/24/12 2100 06/25/12 0500  BP: 125/77 93/59 126/66 139/78  Pulse: 78 72 77 74  Temp: 98.5 F (36.9 C) 98.2 F (36.8 C) 98.8 F (37.1 C) 98.7 F (37.1 C)  TempSrc: Oral Oral Oral Oral  Resp: 18 18 18 18   Height:      Weight: 111.131 kg (245 lb)     SpO2: 97% 97% 96% 96%    General: NAD Cardiovascular: s1, s2, rrr Respiratory: cta b  Discharge Instructions      Discharge Orders    Future Orders Please Complete By Expires   Diet - low sodium heart healthy      Increase activity slowly      Call MD for:  temperature >100.4          Medication List     As of 06/25/2012  1:23 PM    STOP taking these medications         valsartan-hydrochlorothiazide 160-12.5 MG per tablet   Commonly known as: DIOVAN-HCT      TAKE these medications         amLODipine 5 MG tablet   Commonly known as: NORVASC   Take 0.5 tablets (2.5 mg total) by mouth daily.      aspirin EC 81 MG tablet   Take 81 mg by mouth daily.      BACTROBAN 2 %   Generic drug: mupirocin cream   Apply 1 application topically daily as needed.      carbidopa-levodopa 25-100 MG per tablet   Commonly known as: SINEMET IR   Take 1 tablet by mouth Three times a day.      carvedilol 3.125 MG tablet   Commonly known  as: COREG   Take 1 tablet (3.125 mg total) by mouth 2 (two) times daily with a meal.      clobetasol 0.05 % external solution   Commonly known as: TEMOVATE   Apply 1 application topically every other day as needed. For scalp      clopidogrel 75 MG tablet   Commonly known as: PLAVIX   Take 1 tablet (75 mg total) by mouth daily.      dextrose 5 % SOLN 50 mL with ceFEPIme 2 G SOLR 2 g   Inject 2 g into the vein every 12 (twelve) hours. Until 10/27      docusate sodium 100 MG  capsule   Commonly known as: COLACE   Take 100 mg by mouth 2 (two) times daily.      fluticasone 0.05 % cream   Commonly known as: CUTIVATE   Apply 1 application topically 2 (two) times daily as needed. Apply to affected areas on face      HYDROcodone-acetaminophen 7.5-325 MG per tablet   Commonly known as: NORCO   Take 1 tablet by mouth every 6 (six) hours as needed for pain. FOR PAIN      iron polysaccharides 150 MG capsule   Commonly known as: NIFEREX   Take 1 capsule (150 mg total) by mouth 2 (two) times daily.      levETIRAcetam 250 MG tablet   Commonly known as: KEPPRA   Take 250 mg by mouth Twice daily.      nitroGLYCERIN 0.4 MG SL tablet   Commonly known as: NITROSTAT   Place 0.4 mg under the tongue every 5 (five) minutes x 3 doses as needed. For chest pain      rosuvastatin 20 MG tablet   Commonly known as: CRESTOR   Take 1 tablet (20 mg total) by mouth daily.        Follow-up Information    Follow up with Geraldo Pitter, MD. (once discharged from facility)    Contact information:   [address] [place] [address] [phone]           The results of significant diagnostics from this hospitalization (including imaging, microbiology, ancillary and laboratory) are listed below for reference.    Significant Diagnostic Studies: Ct Abdomen Pelvis Wo Contrast  06/22/2012  *RADIOLOGY REPORT*  Clinical Data:  Persistent fever, history stroke, Parkinson's disease,  hypertension, coronary disease post MI and stenting, kidney stones, seizure, right parotid resection, gout  CT CHEST, ABDOMEN AND PELVIS WITHOUT CONTRAST  Technique:  Multidetector CT imaging of the chest, abdomen and pelvis was performed following the standard protocol without IV contrast.  IV contrast not utilized due to renal dysfunction. Dilute oral contrast.  Sagittal and coronal MPR images reconstructed from axial data set.  Comparison:  None  CT CHEST  Findings: Scattered atherosclerotic calcifications aorta and coronary arteries without aneurysm. Left arm PICC line, tip SVC. Small bilateral pleural effusions. Scattered normal-sized thoracic lymph nodes. Scattered areas of minimal linear subsegmental atelectasis bilaterally. No pulmonary infiltrate or pneumothorax. Multiple mild endplate compression fractures throughout the thoracic spine.  IMPRESSION: Small bilateral pleural effusions and minimal scattered atelectasis.  CT ABDOMEN AND PELVIS  Findings: Beam hardening artifacts from the patient's arms limit evaluation. Low attenuation lesion within liver adjacent to gallbladder question cyst 4.5 x 4.6 cm, potentially containing a partial septation. Spleen, pancreas and adrenal glands grossly unremarkable. Question nodule at superior pole right kidney 2.4 x 1.8 cm versus artifact. Bilateral perinephric and peripelvic edema extending along ureters. Minimal collecting system dilatation with slight prominence of the proximal ureters though the distal ureters appear decompressed. Urinary tract infection not excluded. Bladder decompressed by Foley catheter of the wall appears thickened, potentially artifactual but cannot exclude other causes of wall thickening. Scattered atherosclerotic calcifications. Slight prominent stool in the rectosigmoid colon. Tiny umbilical hernia containing fat.  Stomach and bowel loops grossly unremarkable. No mass, adenopathy, or free fluid. Mild scattered soft tissue edema including  presacral. No discrete abscess collection, mass, adenopathy, or hernia. No acute lumbar spine abnormalities.  IMPRESSION: Limitations of exam secondary to lack of IV contrast and beam hardening artifacts. Mild dilatation of renal collecting  systems and proximal ureters with perinephric, peripelvic and periureteral edema. No calculi are identified. Findings suggest urinary tract infection, recommend correlation with urinalysis. Bladder wall thickening is also identified which could be related to infection as well though other etiologies including tumor are not excluded. Probable septated cyst within the liver; recommend sonographic characterization once clinical condition permits. Question small nodule the superior pole right kidney versus artifact, can be assessed at the time of non emergent abdominal ultrasound as well.   Original Report Authenticated By: Lollie Marrow, M.D.    Ct Head Wo Contrast  06/19/2012  *RADIOLOGY REPORT*  Clinical Data: Fall with head injury.  History of hypertension, Parkinson's disease, and stroke.  History of seizures.  Recent right parotidectomy.  CT HEAD WITHOUT CONTRAST  Technique:  Contiguous axial images were obtained from the base of the skull through the vertex without contrast.  Comparison: 08/08/2011  Findings: Mild cerebral atrophy.  Patchy low attenuation changes in the deep white matter consistent with small vessel ischemia. Asymmetric low attenuation in the inferior left frontal lobe is probably due to old infarct and stable since previous study.  No significant ventricular dilatation.  No mass effect or midline shift.  No abnormal extra-axial fluid collections.  Gray-white matter junctions are distinct.  Basal cisterns are not effaced.  No evidence of acute intracranial hemorrhage.  No depressed skull fractures.  Visualized paranasal sinuses and mastoid air cells are not opacified.  Stable appearance since previous study.  IMPRESSION: No acute intracranial  abnormalities.  Mild chronic atrophy and small vessel ischemic changes.   Original Report Authenticated By: Marlon Pel, M.D.    Ct Chest Wo Contrast  06/22/2012  *RADIOLOGY REPORT*  Clinical Data:  Persistent fever, history stroke, Parkinson's disease, hypertension, coronary disease post MI and stenting, kidney stones, seizure, right parotid resection, gout  CT CHEST, ABDOMEN AND PELVIS WITHOUT CONTRAST  Technique:  Multidetector CT imaging of the chest, abdomen and pelvis was performed following the standard protocol without IV contrast.  IV contrast not utilized due to renal dysfunction. Dilute oral contrast.  Sagittal and coronal MPR images reconstructed from axial data set.  Comparison:  None  CT CHEST  Findings: Scattered atherosclerotic calcifications aorta and coronary arteries without aneurysm. Left arm PICC line, tip SVC. Small bilateral pleural effusions. Scattered normal-sized thoracic lymph nodes. Scattered areas of minimal linear subsegmental atelectasis bilaterally. No pulmonary infiltrate or pneumothorax. Multiple mild endplate compression fractures throughout the thoracic spine.  IMPRESSION: Small bilateral pleural effusions and minimal scattered atelectasis.  CT ABDOMEN AND PELVIS  Findings: Beam hardening artifacts from the patient's arms limit evaluation. Low attenuation lesion within liver adjacent to gallbladder question cyst 4.5 x 4.6 cm, potentially containing a partial septation. Spleen, pancreas and adrenal glands grossly unremarkable. Question nodule at superior pole right kidney 2.4 x 1.8 cm versus artifact. Bilateral perinephric and peripelvic edema extending along ureters. Minimal collecting system dilatation with slight prominence of the proximal ureters though the distal ureters appear decompressed. Urinary tract infection not excluded. Bladder decompressed by Foley catheter of the wall appears thickened, potentially artifactual but cannot exclude other causes of wall  thickening. Scattered atherosclerotic calcifications. Slight prominent stool in the rectosigmoid colon. Tiny umbilical hernia containing fat.  Stomach and bowel loops grossly unremarkable. No mass, adenopathy, or free fluid. Mild scattered soft tissue edema including presacral. No discrete abscess collection, mass, adenopathy, or hernia. No acute lumbar spine abnormalities.  IMPRESSION: Limitations of exam secondary to lack of IV contrast and beam hardening artifacts.  Mild dilatation of renal collecting systems and proximal ureters with perinephric, peripelvic and periureteral edema. No calculi are identified. Findings suggest urinary tract infection, recommend correlation with urinalysis. Bladder wall thickening is also identified which could be related to infection as well though other etiologies including tumor are not excluded. Probable septated cyst within the liver; recommend sonographic characterization once clinical condition permits. Question small nodule the superior pole right kidney versus artifact, can be assessed at the time of non emergent abdominal ultrasound as well.   Original Report Authenticated By: Lollie Marrow, M.D.    US Abdomen Complete  06/23/2012  *RADIOLOGY REPORT*  Clinical Data:  Evaluate liver lesion  COMPLETE ABDOMINAL ULTRASOUND  Comparison:  CT chest, abdomen and pelvis - 06/22/2012  Findings:  Examination degraded secondary to patient body habitus.  Gallbladder:  Sonographically normal.  No echogenic gallstones or gall sludge.  No gallbladder wall thickening or pericholecystic fluid.  Negative sonographic Murphy's sign.  Common bile duct:  Normal in size measuring 3.8 mm in diameter  Liver:  Adjacent to the gallbladder fossa within the medial segment left lobe the liver is an approximately 3.6 x 4.1 x 4.2 cm anechoic lesion with internal septations which corresponds to the lesion detected on recently performed noncontrast abdominal CT.  This lesion demonstrates increased  through transmission and is without definitive internal blood flow.  No additional liver lesions are seen.  No definite intrahepatic biliary ductal dilatation.  No ascites.  IVC:  Appears normal.  Pancreas:  Obscured by overlying bowel gas.  Spleen:  Normal in size measuring 10.7 cm in length.  Right Kidney:  Normal cortical thickness, echogenicity and size, measuring 13.3 cm in length.  The described possible hyperattenuating exophytic lesion arising from the superior pole right kidney on recent abdominal CT is not definitively depicted on this examination.  No echogenic renal stones.  There is mild dilatation of the right renal pelvis (image 51) without definite caliectasis.  Left Kidney:  Normal cortical thickness, echogenicity and size, measuring 11.1 cm in length.  No focal renal lesions.  No echogenic renal stones.  No urinary obstruction.  Abdominal aorta:  No aneurysm identified.  Incidental note is made of a small right-sided pleural effusion (image 33).  IMPRESSION: 1.  Approximately 4.2 cm cystic lesion within the medial segment of the left lobe of the liver correlates with the findings are recently performed abdominal CT and is favored to represent a minimally complex hepatic cyst. Comparison with prior examinations (if available) is recommended.  If no comparison exams, further evaluation with repeat abdominal ultrasound in 6 months is recommended to ensure continued stability.  Alternatively, definitive characterization could be performed with a contrast enhanced abdominal MRI or CT as indicated. 2. Unchanged prominence of the right renal pelvis without definite caliectasis, favored to represent an extrarenal pelvis.  No left- sided urinary obstruction.  3. The described possible exophytic hyperattenuating lesion arising in the superior pole right kidney on recent abdominal CT is not definitively depicted on this ultrasound.  4.  Incidental note is made of a small right-sided pleural effusion.    Original Report Authenticated By: Waynard Reeds, M.D.    Dg Chest Port 1 View  06/20/2012  *RADIOLOGY REPORT*  Clinical Data:  PORTABLE CHEST - 1 VIEW  Comparison: Portable exam 1514 hours compared to 06/19/2012  Findings: Left arm PICC line, tip projecting over SVC near cavoatrial junction. Upper normal heart size. Calcified tortuous aorta. Pulmonary vascularity normal. Minimal right basilar atelectasis. Lungs  otherwise clear. No pleural effusion or pneumothorax.  IMPRESSION: Tip of left arm PICC line projects over the SVC near the cavoatrial junction. Minimal right basilar atelectasis.   Original Report Authenticated By: Lollie Marrow, M.D.    Dg Chest Port 1 View  06/19/2012  *RADIOLOGY REPORT*  Clinical Data: Fever.  Pneumonia.  PORTABLE CHEST - 1 VIEW  Comparison: 03/04/2012.  Findings: Low volume chest.  Basilar atelectasis.  No focal consolidation on the single frontal view.  Cardiopericardial silhouette appears similar to prior exam, within normal limits allowing for volumes of inspiration. Monitoring leads are projected over the chest.  IMPRESSION: No acute cardiopulmonary disease.  Low volume chest.   Original Report Authenticated By: Andreas Newport, M.D.     Microbiology: Recent Results (from the past 240 hour(s))  URINE CULTURE     Status: Normal   Collection Time   06/19/12  7:18 PM      Component Value Range Status Comment   Specimen Description URINE, CATHETERIZED   Final    Special Requests NONE   Final    Culture  Setup Time 06/20/2012 01:22   Final    Colony Count >=100,000 COLONIES/ML   Final    Culture ESCHERICHIA COLI   Final    Report Status 06/22/2012 FINAL   Final    Organism ID, Bacteria ESCHERICHIA COLI   Final   CULTURE, BLOOD (ROUTINE X 2)     Status: Normal   Collection Time   06/19/12  7:36 PM      Component Value Range Status Comment   Specimen Description BLOOD RIGHT ARM   Final    Special Requests BOTTLES DRAWN AEROBIC AND ANAEROBIC 8CC   Final     Culture  Setup Time 06/21/2012 02:06   Final    Culture     Final    Value: ESCHERICHIA COLI     Note: SUSCEPTIBILITIES PERFORMED ON PREVIOUS CULTURE WITHIN THE LAST 5 DAYS.     Note: Gram Stain Report Called to,Read Back By and Verified With: KOGER L [id number] 06/20/12 BY Luetta Nutting M Performed at [facility]   Report Status 06/22/2012 FINAL   Final   CULTURE, BLOOD (ROUTINE X 2)     Status: Normal   Collection Time   06/20/12 12:16 AM      Component Value Range Status Comment   Specimen Description BLOOD LEFT ANTECUBITAL   Final    Special Requests BOTTLES DRAWN AEROBIC AND ANAEROBIC 8CC   Final    Culture  Setup Time 06/20/2012 13:40   Final    Culture     Final    Value: ESCHERICHIA COLI     1358 Note: Gram Stain Report Called to,Read Back By and Verified With: KOGER L 06/20/2012 [facility]   Report Status 06/22/2012 FINAL   Final    Organism ID, Bacteria ESCHERICHIA COLI   Final   MRSA PCR SCREENING     Status: Normal   Collection Time   06/20/12  3:00 AM      Component Value Range Status Comment   MRSA by PCR NEGATIVE  NEGATIVE Final      Labs: Basic Metabolic Panel:  Lab 06/25/12 1610 06/24/12 0502 06/23/12 0433 06/22/12 0437 06/21/12 0417 06/20/12 0414  NA 140 138 139 140 139 --  K 4.2 4.5 4.6 3.8 3.1* --  CL 110 109 111 111 106 --  CO2 22 21 21 21 23  --  GLUCOSE 93 92 98 116* 111* --  BUN 21  23 28* 33* 34* --  CREATININE 1.75* 1.92* 2.12* 2.50* 2.39* --  CALCIUM 8.6 8.6 8.2* 8.2* 8.4 --  MG -- -- -- -- -- 2.0  PHOS -- -- -- -- -- --   Liver Function Tests:  Lab 06/20/12 0414 06/19/12 1936  AST 21 20  ALT 12 9  ALKPHOS 44 54  BILITOT 0.4 0.4  PROT 5.6* 7.1  ALBUMIN 2.4* 3.2*   No results found for this basename: LIPASE:5,AMYLASE:5 in the last 168 hours No results found for this basename: AMMONIA:5 in the last 168 hours CBC:  Lab 06/25/12 0620 06/24/12 0502 06/23/12 0433 06/22/12 0437 06/20/12 0414 06/19/12 1936  WBC 11.0* 10.6* 10.7* 10.9* 15.3* --    NEUTROABS -- -- -- -- -- 11.9*  HGB 8.4* 9.2* 8.6* 8.8* 9.5* --  HCT 25.1* 26.7* 25.1* 25.2* 27.1* --  MCV 90.0 88.4 88.7 88.1 87.4 --  PLT 273 262 243 221 242 --   Cardiac Enzymes: No results found for this basename: CKTOTAL:5,CKMB:5,CKMBINDEX:5,TROPONINI:5 in the last 168 hours BNP: BNP (last 3 results) No results found for this basename: PROBNP:3 in the last 8760 hours CBG: No results found for this basename: GLUCAP:5 in the last 168 hours  Time coordinating discharge: greater than 30 minutes  Signed:  Isiaah Cuervo  [facility] 06/25/2012, 1:23 PM

## 2013-02-15 ENCOUNTER — Emergency Department (HOSPITAL_COMMUNITY): Payer: Medicare HMO

## 2013-02-15 ENCOUNTER — Emergency Department (HOSPITAL_COMMUNITY)
Admission: EM | Admit: 2013-02-15 | Discharge: 2013-02-15 | Disposition: A | Discharge status: Home / Self Care | Payer: Medicare HMO | Attending: Emergency Medicine | Admitting: Emergency Medicine

## 2013-02-15 ENCOUNTER — Encounter (HOSPITAL_COMMUNITY): Payer: Self-pay

## 2013-02-15 DIAGNOSIS — R519 Headache, unspecified: Secondary | ICD-10-CM

## 2013-02-15 DIAGNOSIS — I252 Old myocardial infarction: Secondary | ICD-10-CM | POA: Insufficient documentation

## 2013-02-15 DIAGNOSIS — Z8673 Personal history of transient ischemic attack (TIA), and cerebral infarction without residual deficits: Secondary | ICD-10-CM | POA: Insufficient documentation

## 2013-02-15 DIAGNOSIS — Z862 Personal history of diseases of the blood and blood-forming organs and certain disorders involving the immune mechanism: Secondary | ICD-10-CM | POA: Insufficient documentation

## 2013-02-15 DIAGNOSIS — Z87448 Personal history of other diseases of urinary system: Secondary | ICD-10-CM | POA: Insufficient documentation

## 2013-02-15 DIAGNOSIS — Z85828 Personal history of other malignant neoplasm of skin: Secondary | ICD-10-CM | POA: Insufficient documentation

## 2013-02-15 DIAGNOSIS — Z87828 Personal history of other (healed) physical injury and trauma: Secondary | ICD-10-CM | POA: Insufficient documentation

## 2013-02-15 DIAGNOSIS — R040 Epistaxis: Secondary | ICD-10-CM | POA: Insufficient documentation

## 2013-02-15 DIAGNOSIS — IMO0002 Reserved for concepts with insufficient information to code with codable children: Secondary | ICD-10-CM | POA: Insufficient documentation

## 2013-02-15 DIAGNOSIS — R110 Nausea: Secondary | ICD-10-CM | POA: Insufficient documentation

## 2013-02-15 DIAGNOSIS — G40909 Epilepsy, unspecified, not intractable, without status epilepticus: Secondary | ICD-10-CM | POA: Insufficient documentation

## 2013-02-15 DIAGNOSIS — Z8639 Personal history of other endocrine, nutritional and metabolic disease: Secondary | ICD-10-CM | POA: Insufficient documentation

## 2013-02-15 DIAGNOSIS — Z7982 Long term (current) use of aspirin: Secondary | ICD-10-CM | POA: Insufficient documentation

## 2013-02-15 DIAGNOSIS — I10 Essential (primary) hypertension: Secondary | ICD-10-CM | POA: Insufficient documentation

## 2013-02-15 DIAGNOSIS — Z8619 Personal history of other infectious and parasitic diseases: Secondary | ICD-10-CM | POA: Insufficient documentation

## 2013-02-15 DIAGNOSIS — Z8679 Personal history of other diseases of the circulatory system: Secondary | ICD-10-CM | POA: Insufficient documentation

## 2013-02-15 DIAGNOSIS — M129 Arthropathy, unspecified: Secondary | ICD-10-CM | POA: Insufficient documentation

## 2013-02-15 DIAGNOSIS — Z79899 Other long term (current) drug therapy: Secondary | ICD-10-CM | POA: Insufficient documentation

## 2013-02-15 DIAGNOSIS — R51 Headache: Secondary | ICD-10-CM | POA: Insufficient documentation

## 2013-02-15 DIAGNOSIS — Z8669 Personal history of other diseases of the nervous system and sense organs: Secondary | ICD-10-CM | POA: Insufficient documentation

## 2013-02-15 DIAGNOSIS — H539 Unspecified visual disturbance: Secondary | ICD-10-CM | POA: Insufficient documentation

## 2013-02-15 MED ORDER — OXYCODONE-ACETAMINOPHEN 5-325 MG PO TABS
2.0000 | ORAL_TABLET | Freq: Once | ORAL | Status: AC
  Administered 2013-02-15: 2 via ORAL
  Filled 2013-02-15: qty 2

## 2013-02-15 MED ORDER — IBUPROFEN 600 MG PO TABS: 600.0000 mg | ORAL_TABLET | Freq: Four times a day (QID) | ORAL | Status: DC | PRN

## 2013-02-15 MED ORDER — LORAZEPAM 1 MG PO TABS
1.0000 mg | ORAL_TABLET | Freq: Once | ORAL | Status: AC
  Administered 2013-02-15: 1 mg via ORAL
  Filled 2013-02-15: qty 1

## 2013-02-15 NOTE — ED Notes (Signed)
[facility] called to transport pt back to [facility]
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
## 2013-02-15 NOTE — ED Notes (Signed)
Per ems, pt from [facility].  Staff reports pt c/o headache since lunch time today.  Staff also reported the pt having a nose bleed.  Pt reports some blurred vision and some nausea.

## 2013-03-10 ENCOUNTER — Emergency Department (HOSPITAL_COMMUNITY)
Admission: EM | Admit: 2013-03-10 | Discharge: 2013-03-10 | Disposition: A | Discharge status: Home / Self Care | Payer: Medicare HMO | Attending: Emergency Medicine | Admitting: Emergency Medicine

## 2013-03-10 ENCOUNTER — Encounter (HOSPITAL_COMMUNITY): Payer: Self-pay | Admitting: Emergency Medicine

## 2013-03-10 DIAGNOSIS — Z8619 Personal history of other infectious and parasitic diseases: Secondary | ICD-10-CM | POA: Insufficient documentation

## 2013-03-10 DIAGNOSIS — Z8673 Personal history of transient ischemic attack (TIA), and cerebral infarction without residual deficits: Secondary | ICD-10-CM | POA: Insufficient documentation

## 2013-03-10 DIAGNOSIS — Z79899 Other long term (current) drug therapy: Secondary | ICD-10-CM | POA: Insufficient documentation

## 2013-03-10 DIAGNOSIS — G4730 Sleep apnea, unspecified: Secondary | ICD-10-CM | POA: Insufficient documentation

## 2013-03-10 DIAGNOSIS — I2510 Atherosclerotic heart disease of native coronary artery without angina pectoris: Secondary | ICD-10-CM | POA: Insufficient documentation

## 2013-03-10 DIAGNOSIS — Z7982 Long term (current) use of aspirin: Secondary | ICD-10-CM | POA: Insufficient documentation

## 2013-03-10 DIAGNOSIS — I4891 Unspecified atrial fibrillation: Secondary | ICD-10-CM | POA: Insufficient documentation

## 2013-03-10 DIAGNOSIS — Z87448 Personal history of other diseases of urinary system: Secondary | ICD-10-CM | POA: Insufficient documentation

## 2013-03-10 DIAGNOSIS — I10 Essential (primary) hypertension: Secondary | ICD-10-CM | POA: Insufficient documentation

## 2013-03-10 DIAGNOSIS — Z8781 Personal history of (healed) traumatic fracture: Secondary | ICD-10-CM | POA: Insufficient documentation

## 2013-03-10 DIAGNOSIS — I252 Old myocardial infarction: Secondary | ICD-10-CM | POA: Insufficient documentation

## 2013-03-10 DIAGNOSIS — G40909 Epilepsy, unspecified, not intractable, without status epilepticus: Secondary | ICD-10-CM | POA: Insufficient documentation

## 2013-03-10 DIAGNOSIS — Z85828 Personal history of other malignant neoplasm of skin: Secondary | ICD-10-CM | POA: Insufficient documentation

## 2013-03-10 DIAGNOSIS — Z9861 Coronary angioplasty status: Secondary | ICD-10-CM | POA: Insufficient documentation

## 2013-03-10 DIAGNOSIS — M109 Gout, unspecified: Secondary | ICD-10-CM | POA: Insufficient documentation

## 2013-03-10 DIAGNOSIS — G20 Parkinson's disease: Secondary | ICD-10-CM | POA: Insufficient documentation

## 2013-03-10 DIAGNOSIS — Z8719 Personal history of other diseases of the digestive system: Secondary | ICD-10-CM | POA: Insufficient documentation

## 2013-03-10 DIAGNOSIS — G20A1 Parkinson's disease without dyskinesia, without mention of fluctuations: Secondary | ICD-10-CM | POA: Insufficient documentation

## 2013-03-10 DIAGNOSIS — Z7902 Long term (current) use of antithrombotics/antiplatelets: Secondary | ICD-10-CM | POA: Insufficient documentation

## 2013-03-10 DIAGNOSIS — Z87442 Personal history of urinary calculi: Secondary | ICD-10-CM | POA: Insufficient documentation

## 2013-03-10 LAB — URINALYSIS, ROUTINE W REFLEX MICROSCOPIC
Bilirubin Urine: NEGATIVE
Nitrite: NEGATIVE
Specific Gravity, Urine: 1.01 (ref 1.005–1.030)
pH: 6.5 (ref 5.0–8.0)

## 2013-03-10 LAB — CBC
Hemoglobin: 14.5 g/dL (ref 13.0–17.0)
MCH: 31.4 pg (ref 26.0–34.0)
MCV: 89.2 fL (ref 78.0–100.0)
RBC: 4.62 MIL/uL (ref 4.22–5.81)

## 2013-03-10 LAB — COMPREHENSIVE METABOLIC PANEL
ALT: <5 U/L (ref 0–53)
CO2: 27 mEq/L (ref 19–32)
Calcium: 9.4 mg/dL (ref 8.4–10.5)
Chloride: 104 mEq/L (ref 96–112)
Creatinine, Ser: 1.25 mg/dL (ref 0.50–1.35)
GFR calc Af Amer: 68 mL/min — ABNORMAL LOW (ref >90)
GFR calc non Af Amer: 58 mL/min — ABNORMAL LOW (ref >90)
Glucose, Bld: 102 mg/dL — ABNORMAL HIGH (ref 70–99)
Total Bilirubin: 0.5 mg/dL (ref 0.3–1.2)

## 2013-03-10 LAB — RAPID URINE DRUG SCREEN, HOSP PERFORMED
Benzodiazepines: NOT DETECTED
Cocaine: NOT DETECTED

## 2013-03-10 LAB — ETHANOL: Alcohol, Ethyl (B): <11 mg/dL (ref 0–11)

## 2013-03-10 MED ORDER — LEVETIRACETAM 250 MG PO TABS
250.0000 mg | ORAL_TABLET | Freq: Two times a day (BID) | ORAL | Status: DC
  Filled 2013-03-10 (×2): qty 1

## 2013-03-10 MED ORDER — DOCUSATE SODIUM 100 MG PO CAPS
100.0000 mg | ORAL_CAPSULE | Freq: Two times a day (BID) | ORAL | Status: DC
  Filled 2013-03-10 (×2): qty 1

## 2013-03-10 MED ORDER — ASPIRIN EC 81 MG PO TBEC
81.0000 mg | DELAYED_RELEASE_TABLET | Freq: Every day | ORAL | Status: DC
  Filled 2013-03-10: qty 1

## 2013-03-10 MED ORDER — LORAZEPAM 1 MG PO TABS: 1.0000 mg | ORAL_TABLET | Freq: Every day | ORAL | Status: DC

## 2013-03-10 MED ORDER — ATORVASTATIN CALCIUM 40 MG PO TABS
40.0000 mg | ORAL_TABLET | Freq: Every day | ORAL | Status: DC
  Filled 2013-03-10: qty 1

## 2013-03-10 MED ORDER — CLOPIDOGREL BISULFATE 75 MG PO TABS: 75.0000 mg | ORAL_TABLET | Freq: Every day | ORAL | Status: DC

## 2013-03-10 MED ORDER — CARBIDOPA-LEVODOPA 25-100 MG PO TABS
1.0000 | ORAL_TABLET | Freq: Three times a day (TID) | ORAL | Status: DC
  Filled 2013-03-10 (×3): qty 1

## 2013-03-10 MED ORDER — POLYSACCHARIDE IRON COMPLEX 150 MG PO CAPS
150.0000 mg | ORAL_CAPSULE | Freq: Two times a day (BID) | ORAL | Status: DC
  Filled 2013-03-10 (×2): qty 1

## 2013-03-10 MED ORDER — POLYETHYLENE GLYCOL 3350 17 G PO PACK: 17.0000 g | PACK | Freq: Every day | ORAL | Status: DC

## 2013-03-10 MED ORDER — FUROSEMIDE 40 MG PO TABS: 20.0000 mg | ORAL_TABLET | Freq: Every day | ORAL | Status: DC

## 2013-03-10 MED ORDER — COLCHICINE 0.6 MG PO TABS: 0.6000 mg | ORAL_TABLET | Freq: Every day | ORAL | Status: DC

## 2013-03-10 MED ORDER — CARVEDILOL 3.125 MG PO TABS
3.1250 mg | ORAL_TABLET | Freq: Two times a day (BID) | ORAL | Status: DC
  Filled 2013-03-10 (×2): qty 1

## 2013-03-10 NOTE — ED Provider Notes (Signed)
Patient has been evaluated by Dr. Jacky Kindle from telepsych. He is cleared him for discharge. He states that he has marked psychomotor retardation secondary to Parkinson's. He states there may be some underlying fixed paranoid ideas they usually go hand in hand with deterioration of cognitive function. He appears to medically cleared and will be discharged back to [facility] R. Rubin Payor, MD 03/10/13 [phone]
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
## 2013-04-06 ENCOUNTER — Emergency Department (HOSPITAL_COMMUNITY): Payer: Medicare HMO

## 2013-04-06 ENCOUNTER — Encounter (HOSPITAL_COMMUNITY): Payer: Self-pay | Admitting: Emergency Medicine

## 2013-04-06 ENCOUNTER — Emergency Department (HOSPITAL_COMMUNITY)
Admission: EM | Admit: 2013-04-06 | Discharge: 2013-04-06 | Disposition: A | Discharge status: Home / Self Care | Payer: Medicare HMO | Attending: Emergency Medicine | Admitting: Emergency Medicine

## 2013-04-06 DIAGNOSIS — Z7982 Long term (current) use of aspirin: Secondary | ICD-10-CM | POA: Insufficient documentation

## 2013-04-06 DIAGNOSIS — Y9389 Activity, other specified: Secondary | ICD-10-CM | POA: Insufficient documentation

## 2013-04-06 DIAGNOSIS — Y9210 Unspecified residential institution as the place of occurrence of the external cause: Secondary | ICD-10-CM | POA: Insufficient documentation

## 2013-04-06 DIAGNOSIS — Z8781 Personal history of (healed) traumatic fracture: Secondary | ICD-10-CM | POA: Insufficient documentation

## 2013-04-06 DIAGNOSIS — M129 Arthropathy, unspecified: Secondary | ICD-10-CM | POA: Insufficient documentation

## 2013-04-06 DIAGNOSIS — Z9889 Other specified postprocedural states: Secondary | ICD-10-CM | POA: Insufficient documentation

## 2013-04-06 DIAGNOSIS — Z9861 Coronary angioplasty status: Secondary | ICD-10-CM | POA: Insufficient documentation

## 2013-04-06 DIAGNOSIS — Z79899 Other long term (current) drug therapy: Secondary | ICD-10-CM | POA: Insufficient documentation

## 2013-04-06 DIAGNOSIS — G40909 Epilepsy, unspecified, not intractable, without status epilepticus: Secondary | ICD-10-CM | POA: Insufficient documentation

## 2013-04-06 DIAGNOSIS — Z8673 Personal history of transient ischemic attack (TIA), and cerebral infarction without residual deficits: Secondary | ICD-10-CM | POA: Insufficient documentation

## 2013-04-06 DIAGNOSIS — Z87442 Personal history of urinary calculi: Secondary | ICD-10-CM | POA: Insufficient documentation

## 2013-04-06 DIAGNOSIS — Z85828 Personal history of other malignant neoplasm of skin: Secondary | ICD-10-CM | POA: Insufficient documentation

## 2013-04-06 DIAGNOSIS — Z87448 Personal history of other diseases of urinary system: Secondary | ICD-10-CM | POA: Insufficient documentation

## 2013-04-06 DIAGNOSIS — R296 Repeated falls: Secondary | ICD-10-CM | POA: Insufficient documentation

## 2013-04-06 DIAGNOSIS — I10 Essential (primary) hypertension: Secondary | ICD-10-CM | POA: Insufficient documentation

## 2013-04-06 DIAGNOSIS — I252 Old myocardial infarction: Secondary | ICD-10-CM | POA: Insufficient documentation

## 2013-04-06 DIAGNOSIS — S298XXA Other specified injuries of thorax, initial encounter: Secondary | ICD-10-CM | POA: Insufficient documentation

## 2013-04-06 DIAGNOSIS — Z9181 History of falling: Secondary | ICD-10-CM | POA: Insufficient documentation

## 2013-04-06 DIAGNOSIS — M109 Gout, unspecified: Secondary | ICD-10-CM | POA: Insufficient documentation

## 2013-04-06 DIAGNOSIS — G20 Parkinson's disease: Secondary | ICD-10-CM | POA: Insufficient documentation

## 2013-04-06 DIAGNOSIS — I2510 Atherosclerotic heart disease of native coronary artery without angina pectoris: Secondary | ICD-10-CM | POA: Insufficient documentation

## 2013-04-06 DIAGNOSIS — I4891 Unspecified atrial fibrillation: Secondary | ICD-10-CM | POA: Insufficient documentation

## 2013-04-06 DIAGNOSIS — Z8619 Personal history of other infectious and parasitic diseases: Secondary | ICD-10-CM | POA: Insufficient documentation

## 2013-04-06 DIAGNOSIS — Z7902 Long term (current) use of antithrombotics/antiplatelets: Secondary | ICD-10-CM | POA: Insufficient documentation

## 2013-04-06 DIAGNOSIS — G20A1 Parkinson's disease without dyskinesia, without mention of fluctuations: Secondary | ICD-10-CM | POA: Insufficient documentation

## 2013-04-06 DIAGNOSIS — R0789 Other chest pain: Secondary | ICD-10-CM

## 2013-04-06 MED ORDER — OXYCODONE-ACETAMINOPHEN 5-325 MG PO TABS
2.0000 | ORAL_TABLET | Freq: Once | ORAL | Status: AC
  Administered 2013-04-06: 2 via ORAL
  Filled 2013-04-06: qty 2
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
## 2013-04-06 NOTE — ED Notes (Signed)
EMS notified to transport patient back to [facility].  Attempted to call report to nurse at [facility] with no answer.

## 2013-04-27 ENCOUNTER — Ambulatory Visit (INDEPENDENT_AMBULATORY_CARE_PROVIDER_SITE_OTHER): Payer: Medicare HMO | Admitting: Adult Health

## 2013-04-27 ENCOUNTER — Encounter: Payer: Self-pay | Admitting: Adult Health

## 2013-04-27 ENCOUNTER — Ambulatory Visit: Payer: Medicare HMO | Admitting: Adult Health

## 2013-04-27 VITALS — BP 155/91 | HR 73 | Ht 72.0 in | Wt 249.0 lb

## 2013-04-27 DIAGNOSIS — I10 Essential (primary) hypertension: Secondary | ICD-10-CM

## 2013-04-27 DIAGNOSIS — I2510 Atherosclerotic heart disease of native coronary artery without angina pectoris: Secondary | ICD-10-CM
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
## 2013-04-27 NOTE — Assessment & Plan Note (Signed)
Chest discomfort appears to be more musculoskeletal. Pain is reproducible by palpation and by anxiety. Will not make any changes in his medications or plan any testing at this time. If he continues to have recurrent pain, will add nitrates to his medications. He will follow up in 6 months.
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
## 2013-04-27 NOTE — Assessment & Plan Note (Signed)
BP is controlled. I have rechecked it in the room manually. 146/88. Will not make any changes on his medication regimen at this time. He is advised that he should be on low salt diet, and keep feet elevated.

## 2013-04-27 NOTE — Patient Instructions (Signed)
Your physician recommends that you schedule a follow-up appointment in: 6 MONTHS You will receive a reminder letter two months in advance reminding you to call and schedule your appointment. If you don't receive this letter, please contact our office.  Your physician recommends that you continue on your current medications as directed. Please refer to the Current Medication list given to you today.   

## 2013-10-02 ENCOUNTER — Encounter: Payer: Self-pay | Admitting: Cardiovascular Disease

## 2013-10-02 ENCOUNTER — Encounter (INDEPENDENT_AMBULATORY_CARE_PROVIDER_SITE_OTHER): Payer: Self-pay

## 2013-10-02 ENCOUNTER — Ambulatory Visit (INDEPENDENT_AMBULATORY_CARE_PROVIDER_SITE_OTHER): Payer: Medicare HMO | Admitting: Cardiovascular Disease

## 2013-10-02 VITALS — BP 116/76 | HR 69 | Ht 73.0 in | Wt 254.2 lb

## 2013-10-02 DIAGNOSIS — I10 Essential (primary) hypertension: Secondary | ICD-10-CM

## 2013-10-02 DIAGNOSIS — K769 Liver disease, unspecified: Secondary | ICD-10-CM

## 2013-10-02 DIAGNOSIS — E785 Hyperlipidemia, unspecified: Secondary | ICD-10-CM

## 2013-10-02 DIAGNOSIS — I2510 Atherosclerotic heart disease of native coronary artery without angina pectoris: Secondary | ICD-10-CM

## 2013-10-02 NOTE — Patient Instructions (Addendum)
Your physician recommends that you schedule a follow-up appointment in: 6 months with Dr Virgina Jock will receive a reminder letter two months in advance reminding you to call and schedule your appointment. If you don't receive this letter, please contact our office.  Your physician has recommended you make the following change in your medication:  1. STOP ASPIRIN   Your physician recommends that you return for lab work today. LFTS

## 2013-10-02 NOTE — Progress Notes (Signed)
Patient ID: Mark [name], male   DOB: Sep 12, 1945, 68 y.o.   MRN: [id number]      SUBJECTIVE: The patient is a 68 year old male with a history of Parkinson's disease, non-STEMI in 2012 with three-vessel coronary artery disease which has been treated medically, hypertension and hyperlipidemia. His wife, [name], is with him. She tells me that he has had Parkinson's disease for the past 13 or 14 years. He is currently being treated with a broad-spectrum antibiotic for a urinary tract infection, and urine cultures are pending. He had 2 falls last week. He's had a recurrent problem with nosebleeds. He was recently diagnosed with liver disease of unclear etiology. I do not have the results of his most recent lab tests. He very seldom gets chest pain and if he does it is when he is feeling anxious.   Allergies  Allergen Reactions  . Ciprofloxacin Rash    Current Outpatient Prescriptions  Medication Sig Dispense Refill  . amLODipine (NORVASC) 5 MG tablet Take 5 mg by mouth daily.      Marland Kitchen aspirin EC 81 MG tablet Take 81 mg by mouth daily.        . carbidopa-levodopa (SINEMET) 25-100 MG per tablet Take 1 tablet by mouth 3 (three) times daily.       . carvedilol (COREG) 3.125 MG tablet Take 1 tablet (3.125 mg total) by mouth 2 (two) times daily with a meal.  60 tablet  6  . clopidogrel (PLAVIX) 75 MG tablet Take 1 tablet (75 mg total) by mouth daily.  30 tablet  6  . colchicine 0.6 MG tablet Take 0.6 mg by mouth daily.      Marland Kitchen docusate sodium (COLACE) 100 MG capsule Take 100 mg by mouth 2 (two) times daily.      . furosemide (LASIX) 20 MG tablet Take 20 mg by mouth daily.      . iron polysaccharides (NIFEREX) 150 MG capsule Take 1 capsule (150 mg total) by mouth 2 (two) times daily.  60 capsule  0  . levETIRAcetam (KEPPRA) 250 MG tablet Take 250 mg by mouth Twice daily.       Marland Kitchen LORazepam (ATIVAN) 1 MG tablet Take 1 mg by mouth daily.      . polyethylene glycol (MIRALAX / GLYCOLAX) packet Take 17 g by  mouth daily.      . rosuvastatin (CRESTOR) 20 MG tablet Take 1 tablet (20 mg total) by mouth daily.  30 tablet  6  . vitamin B-12 (CYANOCOBALAMIN) 1000 MCG tablet Take 2,000 mcg by mouth daily.       No current facility-administered medications for this visit.    Past Medical History  Diagnosis Date  . Hypertension   . PAF (paroxysmal atrial fibrillation)   . Parkinson's disease   . Gout   . Coronary artery disease     SEVERE 3-VESSEL DX WITH NORMAL EF  . Fall   . Fracture of right olecranon process 10/23/2011  . Stones in the urinary tract   . Arthritis   . Stroke 10/12    Hx of remote stroke, RT SIDED WEAKNESS  . Non-ST elevated myocardial infarction (non-STEMI)   . MI (myocardial infarction) 08/2011    /H&P (05/12/2012)  . Sleep apnea   . Seizures 12/10/2011    HAD SEIZURE  . Skin cancer     left shoulder  . Bacteremia due to Escherichia coli 06/21/2012  . Pyelonephritis, acute 06/23/2012  . 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  Past Surgical History  Procedure Laterality Date  . Transthoracic echocardiogram  06/26/2011    EF 55-60%  . Orif elbow fracture  12/29/2011    Procedure: OPEN REDUCTION INTERNAL FIXATION (ORIF) ELBOW/OLECRANON FRACTURE;  Surgeon: Johnny Bridge, MD;  Location: [place];  Service: Orthopedics;  Laterality: Right;  . Hardware removal  12/29/2011    Procedure: HARDWARE REMOVAL;  Surgeon: Johnny Bridge, MD;  Location: [place];  Service: Orthopedics;  Laterality: Right;  . Parotidectomy  05/12/2012    right  . Cardiac catheterization  06/26/2011    NORMAL LV SYSTOLIC FUNCTION. RCA IS LARGE AND DOMINANT  . Coronary angioplasty with stent placement  ~ 2000    "one that I know of"  . Skin cancer excision  2013    left shoulder  . Parotidectomy  05/12/2012    Procedure: PAROTIDECTOMY;  Surgeon: Rozetta Nunnery, MD;  Location: [place];  Service: ENT;  Laterality: Right;  PAROTIDECTOMY MASS    History   Social History  . Marital Status: Married     Spouse Name: N/A    Number of Children: N/A  . Years of Education: N/A   Occupational History  . Not on file.   Social History Main Topics  . Smoking status: Never Smoker   . Smokeless tobacco: Never Used  . Alcohol Use: No  . Drug Use: No  . Sexual Activity: Yes   Other Topics Concern  . Not on file   Social History Narrative  . No narrative on file    BP 116/76 Pulse 69    PHYSICAL EXAM General: NAD Neck: No JVD, no thyromegaly or thyroid nodule.  Lungs: Clear to auscultation bilaterally with normal respiratory effort. CV: Nondisplaced PMI.  Heart regular S1/S2, no S3/S4, no murmur.  No peripheral edema.  No carotid bruit.  Normal pedal pulses.  Abdomen: Soft, nontender, no hepatosplenomegaly, no distention.  Neurologic: Alert and oriented x 3.  Psych: Normal affect. Extremities: No clubbing or cyanosis.   ECG: reviewed and available in electronic records.      ASSESSMENT AND PLAN: 1. CAD: symptomatically stable. Due to nosebleeds, will d/c ASA and continue Plavix for now.  2. HTN: controlled on present therapy, which includes a clonidine patch started at the nursing home approximately 1 month ago. 3. Hyperlipidemia: given his reported h/o liver disease, I will check LFT's and determine whether or not Crestor should be stopped.  Dispo: f/u 6 months.   Kate Sable, M.D., F.A.C.C.

## 2013-10-05 ENCOUNTER — Encounter: Payer: Self-pay | Admitting: Gastroenterology

## 2013-10-05 ENCOUNTER — Ambulatory Visit (INDEPENDENT_AMBULATORY_CARE_PROVIDER_SITE_OTHER): Payer: Medicare HMO | Admitting: Gastroenterology

## 2013-10-05 VITALS — BP 124/75 | HR 68 | Temp 97.6°F

## 2013-10-05 DIAGNOSIS — R7989 Other specified abnormal findings of blood chemistry: Secondary | ICD-10-CM | POA: Insufficient documentation

## 2013-10-05 DIAGNOSIS — R945 Abnormal results of liver function studies: Principal | ICD-10-CM

## 2013-10-05 NOTE — Assessment & Plan Note (Signed)
68 year old male found to have isolated elevation of Tbili at 2.7, prompting [place] of abdomen. Actual report is difficult to read at time of consultation, but it appears that liver is echogenic, enlarged, suggesting  parenchymal disorder. Repeat LFTs normal a few days ago. Question underlying fatty liver disease. He has no other concerning laboratory abnormalities such as thrombocytopenia, anemia, or low albumin to suggest significant liver disease. Upon review of records in epic, appears there was question of a benign hepatic cyst in 2013. I would like to retrieve a better copy of the ultrasound performed for further review. Will have patient return in 3 months and repeat LFTs at that time. Of course, if there are any changes in his condition in the interim, we will gladly see him sooner. Could be beneficial to pursue a CT with contrast for further characterization and document stabilization of prior cyst. Will retrieve [place] from [facility] in the meantime.   I discussed avoidance and limitation of fatty foods, weight loss, dietary changes. Also discussed initial screening colonoscopy, which he is hesitant to pursue at this moment due to likely challenges with the oral prep. He and his wife will discuss this together and let us know if they change their minds. We will see him again in 3 months.   Thank you for the opportunity to be involved in his care!

## 2013-10-05 NOTE — Progress Notes (Signed)
Primary Care Physician:  Elyn Peers, MD Referring Physician: Burman Nieves Primary Gastroenterologist:  Dr. Gala Romney   Chief Complaint  Patient presents with  . Elevated Hepatic Enzymes    HPI:   Mark [name] presents today at the request of [facility] secondary to enlarged liver and elevated bilirubin. Results at time of visit from outside ultrasound are difficult to read. I have requested an updated, clearer copy for our records. Completed Dec 2014. CMP reveals Tbili 2.7, conjugated bilirubin unknown. All other LFTs normal. Platelets normal. No anemia on CBC. Repeat LFTs on Jan 27 reveal normal bilirunin at 0.3, direct bilirubin 0.14, all other LFTs normal as well.   Denies any prior history of liver disease. Rare lower abdominal discomfort, no aggravating or relieving factors. Described as sharp, lasting all day sometimes. Denies confusion, mental status changes. No jaundice. No N/V. Rare reflux. No dysphagia. Poor appetite the last few weeks, has UTI. No changes in bowel habits. No constipation, diarrhea. No rectal bleeding.   No prior colonoscopy. No prior upper endoscopy.   Uses walker for ambulation.   Past Medical History  Diagnosis Date  . Hypertension   . PAF (paroxysmal atrial fibrillation)   . Parkinson's disease   . Gout   . Coronary artery disease     SEVERE 3-VESSEL DX WITH NORMAL EF  . Fall   . Fracture of right olecranon process 10/23/2011  . Stones in the urinary tract   . Arthritis   . Stroke 10/12    Hx of remote stroke, RT SIDED WEAKNESS  . Non-ST elevated myocardial infarction (non-STEMI)   . MI (myocardial infarction) 08/2011    /H&P (05/12/2012)  . Sleep apnea   . Seizures 12/10/2011    HAD SEIZURE  . Skin cancer     left shoulder  . Bacteremia due to Escherichia coli 06/21/2012  . Pyelonephritis, acute 06/23/2012  . 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    Past Surgical History  Procedure Laterality Date  . Transthoracic echocardiogram  06/26/2011   EF 55-60%  . Orif elbow fracture  12/29/2011    Procedure: OPEN REDUCTION INTERNAL FIXATION (ORIF) ELBOW/OLECRANON FRACTURE;  Surgeon: Johnny Bridge, MD;  Location: [place];  Service: Orthopedics;  Laterality: Right;  . Hardware removal  12/29/2011    Procedure: HARDWARE REMOVAL;  Surgeon: Johnny Bridge, MD;  Location: [place];  Service: Orthopedics;  Laterality: Right;  . Parotidectomy  05/12/2012    right  . Cardiac catheterization  06/26/2011    NORMAL LV SYSTOLIC FUNCTION. RCA IS LARGE AND DOMINANT  . Coronary angioplasty with stent placement  ~ 2000    "one that I know of"  . Skin cancer excision  2013    left shoulder  . Parotidectomy  05/12/2012    Procedure: PAROTIDECTOMY;  Surgeon: Rozetta Nunnery, MD;  Location: [place];  Service: ENT;  Laterality: Right;  PAROTIDECTOMY MASS    Current Outpatient Prescriptions  Medication Sig Dispense Refill  . amLODipine (NORVASC) 5 MG tablet Take 5 mg by mouth daily.      Marland Kitchen aspirin 81 MG tablet Take 81 mg by mouth daily.      . carbidopa-levodopa (SINEMET) 25-100 MG per tablet Take 1 tablet by mouth 3 (three) times daily.       . carvedilol (COREG) 3.125 MG tablet Take 1 tablet (3.125 mg total) by mouth 2 (two) times daily with a meal.  60 tablet  6  . cloNIDine (CATAPRES -  DOSED IN MG/24 HR) 0.1 mg/24hr patch Place 0.1 mg onto the skin once a week.      . clopidogrel (PLAVIX) 75 MG tablet Take 1 tablet (75 mg total) by mouth daily.  30 tablet  6  . colchicine 0.6 MG tablet Take 0.6 mg by mouth daily.      . diphenhydrAMINE (SOMINEX) 25 MG tablet Take 25 mg by mouth at bedtime as needed for sleep.      Marland Kitchen docusate sodium (COLACE) 100 MG capsule Take 100 mg by mouth 2 (two) times daily.      . famotidine (PEPCID) 20 MG tablet Take 20 mg by mouth as needed for heartburn or indigestion.      . furosemide (LASIX) 20 MG tablet Take 20 mg by mouth daily.      Marland Kitchen HYDROcodone-acetaminophen (NORCO) 7.5-325 MG per tablet Take 1 tablet by mouth every 6  (six) hours as needed for moderate pain.      Marland Kitchen ipratropium-albuterol (DUONEB) 0.5-2.5 (3) MG/3ML SOLN Take 3 mLs by nebulization every 6 (six) hours as needed.      . iron polysaccharides (NIFEREX) 150 MG capsule Take 1 capsule (150 mg total) by mouth 2 (two) times daily.  60 capsule  0  . levETIRAcetam (KEPPRA) 250 MG tablet Take 250 mg by mouth Twice daily.       Marland Kitchen LORazepam (ATIVAN) 1 MG tablet Take 1 mg by mouth daily.      . nitroGLYCERIN (NITROSTAT) 0.4 MG SL tablet Place 0.4 mg under the tongue every 5 (five) minutes as needed for chest pain.      Marland Kitchen oxymetazoline (AFRIN) 0.05 % nasal spray Place 1 spray into both nostrils as needed for congestion.      . polyethylene glycol (MIRALAX / GLYCOLAX) packet Take 17 g by mouth daily.      . predniSONE (DELTASONE) 10 MG tablet Take 10 mg by mouth daily with breakfast.      . rosuvastatin (CRESTOR) 20 MG tablet Take 1 tablet (20 mg total) by mouth daily.  30 tablet  6  . sertraline (ZOLOFT) 25 MG tablet Take 25 mg by mouth daily.      . traMADol (ULTRAM) 50 MG tablet Take 50 mg by mouth every 6 (six) hours as needed.      . vitamin B-12 (CYANOCOBALAMIN) 1000 MCG tablet Take 2,000 mcg by mouth daily.       No current facility-administered medications for this visit.    Allergies as of 10/05/2013 - Review Complete 10/05/2013  Allergen Reaction Noted  . Ciprofloxacin Rash 06/25/2012    Family History  Problem Relation Age of Onset  . Heart disease    . Arthritis    . Colon cancer Neg Hx   . Liver disease Neg Hx     History   Social History  . Marital Status: Married    Spouse Name: N/A    Number of Children: N/A  . Years of Education: N/A   Occupational History  . Not on file.   Social History Main Topics  . Smoking status: Never Smoker   . Smokeless tobacco: Never Used  . Alcohol Use: No  . Drug Use: No  . Sexual Activity: Yes   Other Topics Concern  . Not on file   Social History Narrative  . No narrative on file     Review of Systems: Gen: see HPI CV: rare chest discomfort  Resp: Denies shortness of breath at rest or with exertion.  Denies wheezing or cough.  GI: see HPI GU : being treated for UTI MS: +joint pain, limited mobility Derm: Denies rash, itching, dry skin Psych: Denies depression, anxiety, memory loss, and confusion Heme: Denies bruising, bleeding, and enlarged lymph nodes.  Physical Exam: BP 124/75  Pulse 68  Temp(Src) 97.6 F (36.4 C) General:   Alert and oriented. Pleasant and cooperative.  Head:  Normocephalic and atraumatic. Eyes:  Without icterus, sclera clear and conjunctiva pink.  Ears:  Normal auditory acuity. Nose:  No deformity, discharge,  or lesions. Mouth:  No deformity or lesions, oral mucosa pink.  Neck:  Supple, without mass or thyromegaly. Lungs:  Clear to auscultation bilaterally. No wheezes, rales, or rhonchi. No distress.  Heart:  S1, S2 present, no appreciable murmur Abdomen: limited exam as patient in wheelchair and unable to lay on exam table. +BS, soft, non-tender and non-distended. Obese. Unable to appreciate HSM due to positioning. Rectal:  Deferred  Extremities:  1+ lower extremity edema Neurologic:  Alert and  oriented x4 Skin:  Intact without significant lesions or rashes. Cervical Nodes:  No significant cervical adenopathy. Psych:  Alert and cooperative. Normal mood and affect.

## 2013-10-05 NOTE — Patient Instructions (Signed)
Instructions for fatty liver: Recommend 1-2# weight loss per week until ideal body weight through diet. Low fat/cholesterol diet.   Avoid sweets, sodas, fruit juices, sweetened beverages like tea, etc.   We will see you back in 3 months. I would like to have blood work rechecked at that time.

## 2013-10-09 ENCOUNTER — Other Ambulatory Visit: Payer: Self-pay

## 2013-10-09 ENCOUNTER — Encounter: Payer: Self-pay | Admitting: Cardiovascular Disease

## 2013-10-09 DIAGNOSIS — R7989 Other specified abnormal findings of blood chemistry: Secondary | ICD-10-CM

## 2013-10-09 DIAGNOSIS — R945 Abnormal results of liver function studies: Principal | ICD-10-CM

## 2013-10-10 NOTE — Progress Notes (Signed)
cc'd to pcp 

## 2013-10-19 LAB — COMPREHENSIVE METABOLIC PANEL
ALK PHOS: 83 U/L
ALT: 13 U/L (ref 10–40)
AST: 21
Albumin: 4.2
BUN: 19 mg/dL (ref 4–21)
CREATININE: 1.28
Total Bilirubin: 2.7 mg/dL

## 2013-10-19 LAB — HEPATIC FUNCTION PANEL
ALT: 9 U/L — AB (ref 10–40)
AST: 13
Albumin: 3.4
Alkaline Phosphatase: 75 U/L
BILIRUBIN TOTAL: 0.3 mg/dL
Bilirubin, Direct: 0.14 mg/dL (ref 0.01–0.4)
Total Protein: 6.2 g/dL

## 2013-10-19 LAB — CBC
HEMATOCRIT: 44 %
HEMOGLOBIN: 14.5 g/dL
PLATELET COUNT: 248

## 2013-11-28 ENCOUNTER — Other Ambulatory Visit: Payer: Self-pay

## 2013-11-28 DIAGNOSIS — R7989 Other specified abnormal findings of blood chemistry: Secondary | ICD-10-CM

## 2013-11-28 DIAGNOSIS — R945 Abnormal results of liver function studies: Principal | ICD-10-CM

## 2013-12-26 ENCOUNTER — Emergency Department (HOSPITAL_COMMUNITY): Payer: Medicare HMO

## 2013-12-26 ENCOUNTER — Encounter (HOSPITAL_COMMUNITY): Payer: Self-pay | Admitting: Emergency Medicine

## 2013-12-26 ENCOUNTER — Emergency Department (HOSPITAL_COMMUNITY)
Admission: EM | Admit: 2013-12-26 | Discharge: 2013-12-26 | Disposition: A | Discharge status: Home / Self Care | Payer: Medicare HMO | Attending: Emergency Medicine | Admitting: Emergency Medicine

## 2013-12-26 DIAGNOSIS — Z87448 Personal history of other diseases of urinary system: Secondary | ICD-10-CM | POA: Insufficient documentation

## 2013-12-26 DIAGNOSIS — K219 Gastro-esophageal reflux disease without esophagitis: Secondary | ICD-10-CM | POA: Insufficient documentation

## 2013-12-26 DIAGNOSIS — Z79899 Other long term (current) drug therapy: Secondary | ICD-10-CM | POA: Diagnosis not present

## 2013-12-26 DIAGNOSIS — Z8781 Personal history of (healed) traumatic fracture: Secondary | ICD-10-CM | POA: Diagnosis not present

## 2013-12-26 DIAGNOSIS — Z8619 Personal history of other infectious and parasitic diseases: Secondary | ICD-10-CM | POA: Diagnosis not present

## 2013-12-26 DIAGNOSIS — Z9861 Coronary angioplasty status: Secondary | ICD-10-CM | POA: Diagnosis not present

## 2013-12-26 DIAGNOSIS — Z85828 Personal history of other malignant neoplasm of skin: Secondary | ICD-10-CM | POA: Insufficient documentation

## 2013-12-26 DIAGNOSIS — G40909 Epilepsy, unspecified, not intractable, without status epilepticus: Secondary | ICD-10-CM | POA: Insufficient documentation

## 2013-12-26 DIAGNOSIS — Z8673 Personal history of transient ischemic attack (TIA), and cerebral infarction without residual deficits: Secondary | ICD-10-CM | POA: Diagnosis not present

## 2013-12-26 DIAGNOSIS — G20 Parkinson's disease: Secondary | ICD-10-CM | POA: Insufficient documentation

## 2013-12-26 DIAGNOSIS — Z87442 Personal history of urinary calculi: Secondary | ICD-10-CM | POA: Diagnosis not present

## 2013-12-26 DIAGNOSIS — Z7982 Long term (current) use of aspirin: Secondary | ICD-10-CM | POA: Insufficient documentation

## 2013-12-26 DIAGNOSIS — I4891 Unspecified atrial fibrillation: Secondary | ICD-10-CM | POA: Insufficient documentation

## 2013-12-26 DIAGNOSIS — I252 Old myocardial infarction: Secondary | ICD-10-CM | POA: Diagnosis not present

## 2013-12-26 DIAGNOSIS — I2510 Atherosclerotic heart disease of native coronary artery without angina pectoris: Secondary | ICD-10-CM | POA: Diagnosis not present

## 2013-12-26 DIAGNOSIS — M109 Gout, unspecified: Secondary | ICD-10-CM | POA: Diagnosis not present

## 2013-12-26 DIAGNOSIS — R109 Unspecified abdominal pain: Secondary | ICD-10-CM | POA: Diagnosis present

## 2013-12-26 DIAGNOSIS — I10 Essential (primary) hypertension: Secondary | ICD-10-CM | POA: Insufficient documentation

## 2013-12-26 DIAGNOSIS — Z9889 Other specified postprocedural states: Secondary | ICD-10-CM | POA: Diagnosis not present

## 2013-12-26 DIAGNOSIS — G20A1 Parkinson's disease without dyskinesia, without mention of fluctuations: Secondary | ICD-10-CM | POA: Insufficient documentation

## 2013-12-26 HISTORY — DX: Fracture of neck, unspecified, initial encounter: S12.9XXA

## 2013-12-26 HISTORY — DX: Calculus of kidney: N20.0

## 2013-12-26 LAB — CBC WITH DIFFERENTIAL/PLATELET
BASOS PCT: 0 % (ref 0–1)
Basophils Absolute: 0 10*3/uL (ref 0.0–0.1)
Eosinophils Absolute: 0.3 10*3/uL (ref 0.0–0.7)
Eosinophils Relative: 3 % (ref 0–5)
HCT: 42.7 % (ref 39.0–52.0)
Hemoglobin: 14.6 g/dL (ref 13.0–17.0)
Lymphocytes Relative: 12 % (ref 12–46)
Lymphs Abs: 1.4 10*3/uL (ref 0.7–4.0)
MCH: 30.8 pg (ref 26.0–34.0)
MCHC: 34.2 g/dL (ref 30.0–36.0)
MCV: 90.1 fL (ref 78.0–100.0)
Monocytes Absolute: 1 10*3/uL (ref 0.1–1.0)
Monocytes Relative: 9 % (ref 3–12)
NEUTROS PCT: 76 % (ref 43–77)
Neutro Abs: 8.3 10*3/uL — ABNORMAL HIGH (ref 1.7–7.7)
PLATELETS: 267 10*3/uL (ref 150–400)
RBC: 4.74 MIL/uL (ref 4.22–5.81)
RDW: 13.4 % (ref 11.5–15.5)
WBC: 11 10*3/uL — ABNORMAL HIGH (ref 4.0–10.5)

## 2013-12-26 LAB — COMPREHENSIVE METABOLIC PANEL
ALT: <5 U/L (ref 0–53)
AST: 18 U/L (ref 0–37)
Albumin: 3.9 g/dL (ref 3.5–5.2)
Alkaline Phosphatase: 86 U/L (ref 39–117)
BUN: 20 mg/dL (ref 6–23)
CHLORIDE: 103 meq/L (ref 96–112)
CO2: 26 meq/L (ref 19–32)
Calcium: 9.3 mg/dL (ref 8.4–10.5)
Creatinine, Ser: 1.26 mg/dL (ref 0.50–1.35)
GFR calc Af Amer: 66 mL/min — ABNORMAL LOW (ref >90)
GFR, EST NON AFRICAN AMERICAN: 57 mL/min — AB (ref >90)
Glucose, Bld: 107 mg/dL — ABNORMAL HIGH (ref 70–99)
Potassium: 4.3 mEq/L (ref 3.7–5.3)
SODIUM: 142 meq/L (ref 137–147)
Total Bilirubin: 0.3 mg/dL (ref 0.3–1.2)
Total Protein: 7.6 g/dL (ref 6.0–8.3)

## 2013-12-26 LAB — URINALYSIS, ROUTINE W REFLEX MICROSCOPIC
BILIRUBIN URINE: NEGATIVE
Glucose, UA: NEGATIVE mg/dL
HGB URINE DIPSTICK: NEGATIVE
KETONES UR: NEGATIVE mg/dL
Leukocytes, UA: NEGATIVE
NITRITE: NEGATIVE
PROTEIN: NEGATIVE mg/dL
UROBILINOGEN UA: 0.2 mg/dL (ref 0.0–1.0)
pH: 5.5 (ref 5.0–8.0)

## 2013-12-26 LAB — LIPASE, BLOOD: Lipase: 33 U/L (ref 11–59)

## 2013-12-26 LAB — LACTIC ACID, PLASMA: LACTIC ACID, VENOUS: 1.3 mmol/L (ref 0.5–2.2)

## 2013-12-26 MED ORDER — SODIUM CHLORIDE 0.9 % IV SOLN
INTRAVENOUS | Status: DC
  Administered 2013-12-26: 08:00:00 via INTRAVENOUS

## 2013-12-26 MED ORDER — SODIUM CHLORIDE 0.9 % IJ SOLN
INTRAMUSCULAR | Status: AC
  Filled 2013-12-26: qty 500

## 2013-12-26 MED ORDER — HYDROCODONE-ACETAMINOPHEN 5-325 MG PO TABS
1.0000 | ORAL_TABLET | Freq: Once | ORAL | Status: AC
  Administered 2013-12-26: 1 via ORAL
  Filled 2013-12-26: qty 1

## 2013-12-26 MED ORDER — ONDANSETRON HCL 4 MG/2ML IJ SOLN
4.0000 mg | INTRAMUSCULAR | Status: DC | PRN
  Administered 2013-12-26: 4 mg via INTRAVENOUS
  Filled 2013-12-26: qty 2

## 2013-12-26 MED ORDER — SODIUM CHLORIDE 0.9 % IV SOLN
INTRAVENOUS | Status: AC
  Filled 2013-12-26: qty 250

## 2013-12-26 MED ORDER — IOHEXOL 300 MG/ML  SOLN
50.0000 mL | Freq: Once | INTRAMUSCULAR | Status: AC | PRN
  Administered 2013-12-26: 25 mL via ORAL

## 2013-12-26 MED ORDER — IOHEXOL 300 MG/ML  SOLN
100.0000 mL | Freq: Once | INTRAMUSCULAR | Status: AC | PRN
  Administered 2013-12-26: 100 mL via INTRAVENOUS

## 2013-12-26 MED ORDER — FENTANYL CITRATE 0.05 MG/ML IJ SOLN
12.5000 ug | INTRAMUSCULAR | Status: AC | PRN
  Administered 2013-12-26 (×2): 12.5 ug via INTRAVENOUS
  Filled 2013-12-26 (×2): qty 2

## 2013-12-26 NOTE — ED Notes (Signed)
Pt requesting more pain meds, edp notified and orders received.

## 2013-12-26 NOTE — Discharge Instructions (Signed)
°Emergency Department Resource Guide °1) Find a Doctor and Pay Out of Pocket °Although you won't have to find out who is covered by your insurance plan, it is a good idea to ask around and get recommendations. You will then need to call the office and see if the doctor you have chosen will accept you as a new patient and what types of options they offer for patients who are self-pay. Some doctors offer discounts or will set up payment plans for their patients who do not have insurance, but you will need to ask so you aren't surprised when you get to your appointment. ° °2) Contact Your Local Health Department °Not all health departments have doctors that can see patients for sick visits, but many do, so it is worth a call to see if yours does. If you don't know where your local health department is, you can check in your phone book. The CDC also has a tool to help you locate your state's health department, and many state websites also have listings of all of their local health departments. ° °3) Find a Walk-in Clinic °If your illness is not likely to be very severe or complicated, you may want to try a walk in clinic. These are popping up all over the country in pharmacies, drugstores, and shopping centers. They're usually staffed by nurse practitioners or physician assistants that have been trained to treat common illnesses and complaints. They're usually fairly quick and inexpensive. However, if you have serious medical issues or chronic medical problems, these are probably not your best option. ° °No Primary Care Doctor: °- Call [facility] at  832-8000 - they can help you locate a primary care doctor that  accepts your insurance, provides certain services, etc. °- Physician Referral Service- 1-800-533-3463 ° °Chronic Pain Problems: °Organization         Address  Phone   Notes  °[facility]  (336) 297-2271 Patients need to be referred by their primary care doctor.  ° °Medication  Assistance: °Organization         Address  Phone   Notes  °[place] [facility] [address] °[place], [place] [address] (336) 641-8030 --Must be a resident of [place] °-- Must have NO insurance coverage whatsoever (no Medicaid/ Medicare, etc.) °-- The pt. MUST have a primary care doctor that directs their care regularly and follows them in the community °  °[facility]  (866) 331-1348   °[employer]  (888) 892-1162   ° °Agencies that provide inexpensive medical care: °Organization         Address  Phone   Notes  °[facility]  (336) 832-8035   °[facility]    (336) 832-7272   °[facility] [address] °[place], [place] [address] (336) 832-4777   °[facility] [address], °[place] (336) 271-4999   °[facility]    (336) 373-0678   °[facility]    (336) 272-1050   °[facility] ° [address], [place] Phone:  (336) 832-4444, Fax:  (336) 832-4440 Hours of Operation:  9 am - 6 pm, M-F.  Also accepts Medicaid/Medicare and self-pay.  °[facility] ° [address], [place] Phone: (336) 832-3150, Fax: (336) [phone]. Hours of Operation:  8:30 am - 5:30 pm, M-F.  Also accepts Medicaid and self-pay.  °[facility] [place] [address], [place] Phone: (336) 878-6027   °[facility] [address], [place], [place] (336)723-1848, Ext. 123 Mondays & Thursdays: 7-9 AM.  First 15 patients are seen on a first come, first serve basis. °  ° °Medicaid-accepting [place] Providers: ° °Organization         Address  Phone   Notes  °[facility] [address], [place] (336) 641-2100 Also accepts self-pay patients.  °[facility] [address], [place] ° (336) 856-9996   °[facility] [address], [place]  (336) 288-8857   °[facility] [address] [place] [address], [place] (336) 299-7000   °Veita Bland [address], [place]  ° (336) 373-1557 Only accepts [place] Access Medicaid patients after they have their name applied to their card.  ° °Self-Pay (no insurance) in [place]: ° °Organization         Address  Phone   Notes  °Sickle Cell Patients, [facility] [address], [place] (336) 832-1970   °[facility] [address], [place] (336) 832-4400   °[facility] [place] ° [address], [place] (336) 992-4800   °[facility]/Dr. Osei-Bonsu ° [address] [place] [address], [place] or [address], [place] (336) 841-8500 Phone number for both [place] and [place] locations is the same.  °[facility] [address], [place] (336) 299-0000   °[facility] [address] [place] [address], [place] or [address] (336) 852-7530 °(336) 878-2260   °[facility] [address], [place] (336) 350-1642, phone; (336) 294-5005, fax Sees patients 1st and 3rd Saturday of every month.  Must not qualify for public or private insurance (i.e. Medicaid, Medicare, [place] Health Choice, Veterans' Benefits) • Household income should be no more than 200% of the poverty level •The clinic cannot treat you if you are pregnant or think you are pregnant • Sexually transmitted diseases are not treated at the clinic.  ° ° °Dental Care: °Organization         Address  Phone  Notes  °[place] [facility] [address], [place] (336) 641-6152 Accepts children up to age 21 who are enrolled in Medicaid or [place] Health Choice; pregnant women with a Medicaid card; and children who have applied for Medicaid or [place] Health Choice, but were declined, whose parents can pay a reduced fee at time of service.  °[place]  [facility] [place]  [address], [place] (336) 641-7733 Accepts children up to age 21 who are enrolled in Medicaid or [place] Health Choice; pregnant women with a Medicaid card; and children who have applied for Medicaid or [place] Health Choice, but were declined, whose parents can pay a reduced fee at time of service.  °[facility] ° [address], [place] (336) 641-4533 Patients are seen by appointment only. Walk-ins are not accepted. [facility] will see patients 18 years of age and older. °Monday - Tuesday (8am-5pm) °Most Wednesdays (8:30-5pm) °$30 per visit, cash only  °[facility] ° [address], [place] (336) 641-4533 Patients are seen by appointment only. Walk-ins are not accepted. [facility] will see patients 18 years of age and older. °One   Wednesday Evening (Monthly: Volunteer Based).  $30 per visit, cash only  °[facility]  (919) 537-3737 for adults; Children under age 4, call Graduate Pediatric Dentistry at ([phone]) [phone]. Children aged 4-14, please call (919) [phone] to request a pediatric application. ° Dental services are provided in all areas of dental care including fillings, crowns and bridges, complete and partial dentures, implants, gum treatment, root canals, and extractions. Preventive care is also provided. Treatment is provided to both adults and children. °Patients are selected via a lottery and there is often a waiting list. °  °[facility] [address], °[place] ° (336) 763-8833 [email or web address] °  °[facility] [address], [place], [place] (336)723-1848, Ext. 123 Second and Fourth Thursday of each month, opens at 6:30 AM; Clinic ends at 9 AM.  Patients are seen on a first-come first-served basis, and a limited number are seen during each clinic.  ° °[facility] ° [address], [place], [place] (336) 723-7904    Eligibility Requirements °You must have lived in [place], or [place] counties for at least the last three months. °  You cannot be eligible for state or federal sponsored healthcare insurance, including Veterans Administration, Medicaid, or Medicare. °  You generally cannot be eligible for healthcare insurance through your employer.  °  How to apply: °Eligibility screenings are held every Tuesday and Wednesday afternoon from 1:00 pm until 4:00 pm. You do not need an appointment for the interview!  °[facility] [address], [place], [place] 336-631-2330   °[facility]  336-342-8273   °[facility]  336-703-3100   °[facility]  336-570-6415   ° °Behavioral Health Resources in the Community: °Intensive Outpatient Programs °Organization         Address  Phone  Notes  °[facility] [address], [place], [place] 336-878-6098   °[facility] [address], [place], [place] 336-832-9800   °[facility] [address], [place], [place] ° 336-882-2125   °[facility] [address],  °[place], [place] 1-800-853-5163 or 336-641-4981   °Substance Abuse Resources °Organization         Address  Phone  Notes  °[facility]  336-882-2125   °[facility]  336-784-9470   °[facility]  336-285-9073   °[facility]  336-845-3988   °Residential & Outpatient Substance Abuse Program  1-800-659-3381   °Psychological Services °Organization         Address  Phone  Notes  °[facility]  336- 832-9600   °[facility]  336- 378-7881   °[facility] [address], [place] 1-800-853-5163 or 336-641-4981   ° °Mobile Crisis Teams °Organization         Address  Phone  Notes  °[facility], Mobile Crisis Care Unit  1-877-626-1772   °Assertive °[facility] ° [address]  [place], [place] 336-834-9664   °Sharon DeEsch [address] °[place] 336-554-5454   ° °Self-Help/Support Groups °Organization         Address  Phone             Notes  °[facility] - variety of support groups  336- 373-1402 Call for more information  °Narcotics Anonymous (NA), [facility] [address], °[place] [address]  2 meetings at this location  ° °  Residential Treatment Programs °Organization         Address  Phone  Notes  °[facility] [address],    °[place]  1-866-801-8205   °[facility] ° [address], [place], [place] 704-293-8524   °[facility] [address], [place] 336-845-3988 Admissions: 8am-3pm M-F  °[facility] [address]    °[place], [place] 336-841-1104   °[facility] [address], [place], [place] 336-379-7146   °[facility] [address]  °[place], [place] 336-285-9073   °[facility] - Intensive Outpatient [address], [place], [place] 336-852-3033   °[facility] [address]  °[place], [place] 1-877-615-2722 or 336-784-9470   °[facility] [address] [place], [place] 336-227-7417 Accepts Medicaid  °[facility] [address]  °[place] 1-800-659-3381 Substance Abuse/Addiction Treatment  ° °[place] Behavioral Health Resources °Organization         Address  Phone  Notes  °[facility]  (888) 581-9988   °Julie Brannon, PhD [address] [place], [place]   (336) 349-5553 or (336) 951-0000   °[facility]   [address] °[place], [place] (336) 349-4454   °[facility] [address], [place], [place] (336) 342-8316 Insurance/Medicaid/sponsorship through [facility]  °[facility] [address]                                    [place], [place] (336) 342-8316 Therapy/tele-psych/case    °[facility] [address]  ° [place], [place] (336) 349-2233    °Dr. Arfeen  (336) 349-4544   °[facility] [place]  [employer] [place] [facility] 1) [address], [place] °2) [address], [place] °3)  [address], [place] (336) 349-3220 °(336) 342-7768 ° °(336) 342-8140   °[place] [facility] (336) 342-1394 or (336) 342-3537 (After Hours)    ° ° °Take your usual prescriptions as previously directed.  Call your regular medical doctor today to schedule a follow up appointment within the next 2 days.  Return to the Emergency Department immediately sooner if worsening.  ° °

## 2013-12-26 NOTE — ED Notes (Signed)
Pt resident at [facility] - reports starting having right flank pain with nausea last night.  Denies v/d.  Was given pain meds by facility - pt called EMS from his room stating he didn't think the medicine he was taking was his pain medicine.  Pt alert and oriented at this time.

## 2013-12-26 NOTE — ED Provider Notes (Signed)
CSN: [id number]     Arrival date & time 12/26/13  0708 History   First MD Initiated Contact with Patient 12/26/13 [phone]     Chief Complaint  Patient presents with  . Flank Pain  . Abdominal Pain      HPI Pt was seen at 0710.  Per EMS, NH report and pt, c/o gradual onset and persistence of constant right sided abd/flank "pain" since last night.  Has been associated with nausea.  Describes the abd pain as "sore" and "aching."  Pt states NH has been giving him "pain medicine" with improvement. Denies vomiting/diarrhea, no fevers, no back pain, no rash, no CP/SOB, no black or blood in stools.       Past Medical History  Diagnosis Date  . Hypertension   . PAF (paroxysmal atrial fibrillation)   . Parkinson's disease   . Gout   . Coronary artery disease     SEVERE 3-VESSEL DX WITH NORMAL EF  . Fall   . Fracture of right olecranon process 10/23/2011  . Arthritis   . Stroke 10/12    Hx of remote stroke, RT SIDED WEAKNESS  . Non-ST elevated myocardial infarction (non-STEMI)   . MI (myocardial infarction) 08/2011    /H&P (05/12/2012)  . Sleep apnea   . Seizures 12/10/2011    HAD SEIZURE  . Skin cancer     left shoulder  . Bacteremia due to Escherichia coli 06/21/2012  . Pyelonephritis, acute 06/23/2012  . 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  . Kidney stones    Past Surgical History  Procedure Laterality Date  . Transthoracic echocardiogram  06/26/2011    EF 55-60%  . Orif elbow fracture  12/29/2011    Procedure: OPEN REDUCTION INTERNAL FIXATION (ORIF) ELBOW/OLECRANON FRACTURE;  Surgeon: Johnny Bridge, MD;  Location: [place];  Service: Orthopedics;  Laterality: Right;  . Hardware removal  12/29/2011    Procedure: HARDWARE REMOVAL;  Surgeon: Johnny Bridge, MD;  Location: [place];  Service: Orthopedics;  Laterality: Right;  . Parotidectomy  05/12/2012    right  . Cardiac catheterization  06/26/2011    NORMAL LV SYSTOLIC FUNCTION. RCA IS LARGE AND DOMINANT  . Coronary angioplasty with  stent placement  ~ 2000    "one that I know of"  . Skin cancer excision  2013    left shoulder  . Parotidectomy  05/12/2012    Procedure: PAROTIDECTOMY;  Surgeon: Rozetta Nunnery, MD;  Location: [place];  Service: ENT;  Laterality: Right;  PAROTIDECTOMY MASS   Family History  Problem Relation Age of Onset  . Heart disease    . Arthritis    . Colon cancer Neg Hx   . Liver disease Neg Hx    History  Substance Use Topics  . Smoking status: Never Smoker   . Smokeless tobacco: Never Used  . Alcohol Use: No    Review of Systems ROS: Statement: All systems negative except as marked or noted in the HPI; Constitutional: Negative for fever and chills. ; ; Eyes: Negative for eye pain, redness and discharge. ; ; ENMT: Negative for ear pain, hoarseness, nasal congestion, sinus pressure and sore throat. ; ; Cardiovascular: Negative for chest pain, palpitations, diaphoresis, dyspnea and peripheral edema. ; ; Respiratory: Negative for cough, wheezing and stridor. ; ; Gastrointestinal: +abd pain, nausea. Negative for vomiting, diarrhea, blood in stool, hematemesis, jaundice and rectal bleeding. . ; ; Genitourinary: Negative for dysuria and hematuria. ; ;Genital:  No penile drainage  or rash, no testicular pain or swelling, no scrotal rash or swelling. ;; Musculoskeletal: Negative for back pain and neck pain. Negative for swelling and trauma.; ; Skin: Negative for pruritus, rash, abrasions, blisters, bruising and skin lesion.; ; Neuro: Negative for headache, lightheadedness and neck stiffness. Negative for weakness, altered level of consciousness , altered mental status, extremity weakness, paresthesias, involuntary movement, seizure and syncope.      Allergies  Ciprofloxacin  Home Medications   Prior to Admission medications   Medication Sig Start Date End Date Taking? Authorizing Provider  amLODipine (NORVASC) 5 MG tablet Take 5 mg by mouth daily.    Historical Provider, MD  aspirin 81 MG tablet  Take 81 mg by mouth daily.    Historical Provider, MD  carbidopa-levodopa (SINEMET) 25-100 MG per tablet Take 1 tablet by mouth 3 (three) times daily.  09/23/11   Historical Provider, MD  carvedilol (COREG) 3.125 MG tablet Take 1 tablet (3.125 mg total) by mouth 2 (two) times daily with a meal. 04/05/12   Thayer Headings, MD  cloNIDine (CATAPRES - DOSED IN MG/24 HR) 0.1 mg/24hr patch Place 0.1 mg onto the skin once a week.    Historical Provider, MD  clopidogrel (PLAVIX) 75 MG tablet Take 1 tablet (75 mg total) by mouth daily. 05/15/12   Rozetta Nunnery, MD  colchicine 0.6 MG tablet Take 0.6 mg by mouth daily.    Historical Provider, MD  diphenhydrAMINE (SOMINEX) 25 MG tablet Take 25 mg by mouth at bedtime as needed for sleep.    Historical Provider, MD  docusate sodium (COLACE) 100 MG capsule Take 100 mg by mouth 2 (two) times daily.    Historical Provider, MD  famotidine (PEPCID) 20 MG tablet Take 20 mg by mouth as needed for heartburn or indigestion.    Historical Provider, MD  furosemide (LASIX) 20 MG tablet Take 20 mg by mouth daily.    Historical Provider, MD  HYDROcodone-acetaminophen (NORCO) 7.5-325 MG per tablet Take 1 tablet by mouth every 6 (six) hours as needed for moderate pain.    Historical Provider, MD  ipratropium-albuterol (DUONEB) 0.5-2.5 (3) MG/3ML SOLN Take 3 mLs by nebulization every 6 (six) hours as needed.    Historical Provider, MD  iron polysaccharides (NIFEREX) 150 MG capsule Take 1 capsule (150 mg total) by mouth 2 (two) times daily. 06/25/12   Kathie Dike, MD  levETIRAcetam (KEPPRA) 250 MG tablet Take 250 mg by mouth Twice daily.  09/23/11   Historical Provider, MD  LORazepam (ATIVAN) 1 MG tablet Take 1 mg by mouth daily.    Historical Provider, MD  nitroGLYCERIN (NITROSTAT) 0.4 MG SL tablet Place 0.4 mg under the tongue every 5 (five) minutes as needed for chest pain.    Historical Provider, MD  oxymetazoline (AFRIN) 0.05 % nasal spray Place 1 spray into both nostrils  as needed for congestion.    Historical Provider, MD  polyethylene glycol (MIRALAX / GLYCOLAX) packet Take 17 g by mouth daily.    Historical Provider, MD  predniSONE (DELTASONE) 10 MG tablet Take 10 mg by mouth daily with breakfast.    Historical Provider, MD  rosuvastatin (CRESTOR) 20 MG tablet Take 1 tablet (20 mg total) by mouth daily. 04/05/12   Thayer Headings, MD  sertraline (ZOLOFT) 25 MG tablet Take 25 mg by mouth daily.    Historical Provider, MD  traMADol (ULTRAM) 50 MG tablet Take 50 mg by mouth every 6 (six) hours as needed.    Historical Provider, MD  vitamin B-12 (CYANOCOBALAMIN) 1000 MCG tablet Take 2,000 mcg by mouth daily.    Historical Provider, MD   BP 168/102  Pulse 96  Temp(Src) 98.2 F (36.8 C) (Oral)  Resp 20  Ht 6\' 1"  (1.854 m)  Wt 255 lb (115.667 kg)  BMI 33.65 kg/m2  SpO2 94% Physical Exam 0715: Physical examination:  Nursing notes reviewed; Vital signs and O2 SAT reviewed;  Constitutional: Well developed, Well nourished, Well hydrated, In no acute distress; Head:  Normocephalic, atraumatic; Eyes: EOMI, PERRL, No scleral icterus; ENMT: Mouth and pharynx normal, Mucous membranes moist; Neck: Supple, Full range of motion, No lymphadenopathy; Cardiovascular: Regular rate and rhythm, No gallop; Respiratory: Breath sounds clear & equal bilaterally, No rales, rhonchi, wheezes.  Speaking full sentences with ease, Normal respiratory effort/excursion; Chest: Nontender, Movement normal; Abdomen: Soft, +RUQ and RLQ tenderness to palp. No rebound or guarding. Nondistended, Normal bowel sounds; Genitourinary: No CVA tenderness; Spine:  No midline CS, TS, LS tenderness. No rash.;; Extremities: Pulses normal, No tenderness, No edema, No calf edema or asymmetry.; Neuro: AA&Ox3, Major CN grossly intact.  Speech clear. No gross focal motor or sensory deficits in extremities.; Skin: Color normal, Warm, Dry.   ED Course  Procedures     EKG Interpretation None      MDM   MDM Reviewed: previous chart, nursing note and vitals Reviewed previous: labs Interpretation: labs, x-ray and CT scan   Results for orders placed during the hospital encounter of 12/26/13  URINALYSIS, ROUTINE W REFLEX MICROSCOPIC      Result Value Ref Range   Color, Urine YELLOW  YELLOW   APPearance CLEAR  CLEAR   Specific Gravity, Urine >1.030 (*) 1.005 - 1.030   pH 5.5  5.0 - 8.0   Glucose, UA NEGATIVE  NEGATIVE mg/dL   Hgb urine dipstick NEGATIVE  NEGATIVE   Bilirubin Urine NEGATIVE  NEGATIVE   Ketones, ur NEGATIVE  NEGATIVE mg/dL   Protein, ur NEGATIVE  NEGATIVE mg/dL   Urobilinogen, UA 0.2  0.0 - 1.0 mg/dL   Nitrite NEGATIVE  NEGATIVE   Leukocytes, UA NEGATIVE  NEGATIVE  CBC WITH DIFFERENTIAL      Result Value Ref Range   WBC 11.0 (*) 4.0 - 10.5 K/uL   RBC 4.74  4.22 - 5.81 MIL/uL   Hemoglobin 14.6  13.0 - 17.0 g/dL   HCT 42.7  39.0 - 52.0 %   MCV 90.1  78.0 - 100.0 fL   MCH 30.8  26.0 - 34.0 pg   MCHC 34.2  30.0 - 36.0 g/dL   RDW 13.4  11.5 - 15.5 %   Platelets 267  150 - 400 K/uL   Neutrophils Relative % 76  43 - 77 %   Neutro Abs 8.3 (*) 1.7 - 7.7 K/uL   Lymphocytes Relative 12  12 - 46 %   Lymphs Abs 1.4  0.7 - 4.0 K/uL   Monocytes Relative 9  3 - 12 %   Monocytes Absolute 1.0  0.1 - 1.0 K/uL   Eosinophils Relative 3  0 - 5 %   Eosinophils Absolute 0.3  0.0 - 0.7 K/uL   Basophils Relative 0  0 - 1 %   Basophils Absolute 0.0  0.0 - 0.1 K/uL  COMPREHENSIVE METABOLIC PANEL      Result Value Ref Range   Sodium 142  137 - 147 mEq/L   Potassium 4.3  3.7 - 5.3 mEq/L   Chloride 103  96 - 112 mEq/L   CO2 26  19 - 32 mEq/L   Glucose, Bld 107 (*) 70 - 99 mg/dL   BUN 20  6 - 23 mg/dL   Creatinine, Ser 1.26  0.50 - 1.35 mg/dL   Calcium 9.3  8.4 - 10.5 mg/dL   Total Protein 7.6  6.0 - 8.3 g/dL   Albumin 3.9  3.5 - 5.2 g/dL   AST 18  0 - 37 U/L   ALT <5  0 - 53 U/L   Alkaline Phosphatase 86  39 - 117 U/L   Total Bilirubin 0.3  0.3 - 1.2 mg/dL   GFR calc non Af  Amer 57 (*) >90 mL/min   GFR calc Af Amer 66 (*) >90 mL/min  LIPASE, BLOOD      Result Value Ref Range   Lipase 33  11 - 59 U/L  LACTIC ACID, PLASMA      Result Value Ref Range   Lactic Acid, Venous 1.3  0.5 - 2.2 mmol/L   Ct Abdomen Pelvis W Contrast 12/26/2013   CLINICAL DATA:  Right-sided abdominal pain.  Nausea  EXAM: CT ABDOMEN AND PELVIS WITH CONTRAST  TECHNIQUE: Multidetector CT imaging of the abdomen and pelvis was performed using the standard protocol following bolus administration of intravenous contrast.  CONTRAST:  179mL OMNIPAQUE IOHEXOL 300 MG/ML SOLN, 34mL OMNIPAQUE IOHEXOL 300 MG/ML SOLN  COMPARISON:  CT abdomen 06/22/2012  FINDINGS: Small right pleural effusion.  Mild atelectasis in the lung bases.  4.2 cm cyst in the lateral segment of the left lobe of the liver is stable. Remainder of the liver is normal. Gallbladder and bile ducts appear normal. Pancreas spleen and kidneys are normal.  Negative for bowel obstruction or bowel thickening. Appendix is normal.  Negative for mass or adenopathy. No free fluid. No acute bony abnormality. Multiple chronic fractures in the lower thoracic spine.  IMPRESSION: No acute abnormality.  Normal appendix.   Electronically Signed   By: Franchot Gallo M.D.   On: 12/26/2013 08:54   Dg Chest Port 1 View 12/26/2013   CLINICAL DATA:  Flank pain, hypertension  EXAM: PORTABLE CHEST - 1 VIEW  COMPARISON:  DG CHEST 1V PORT dated 04/06/2013  FINDINGS: There are low lung volumes. There is crowding of the interstitial markings. There is no focal parenchymal opacity, pleural effusion, or pneumothorax. The heart and mediastinal contours are unremarkable.  The osseous structures are unremarkable.  IMPRESSION: No active disease.   Electronically Signed   By: Kathreen Devoid   On: 12/26/2013 08:02    0915:  Feels better after pain meds. Has tol PO well without N/V. No stooling while in the ED.  Workup reassuring. Dx and testing d/w pt and family.  Questions answered.   Verb understanding, agreeable to d/c back to NH with outpt f/u.      Alfonzo Feller, DO 12/27/13 1932

## 2013-12-26 NOTE — ED Notes (Signed)
[facility] called to transport pt back to [facility].

## 2013-12-28 LAB — URINE CULTURE

## 2014-01-03 ENCOUNTER — Encounter: Payer: Self-pay | Admitting: Gastroenterology

## 2014-01-03 ENCOUNTER — Ambulatory Visit (INDEPENDENT_AMBULATORY_CARE_PROVIDER_SITE_OTHER): Payer: Medicare HMO | Admitting: Gastroenterology

## 2014-01-03 VITALS — BP 145/83 | HR 82 | Temp 97.6°F | Ht 73.0 in | Wt 255.0 lb

## 2014-01-03 DIAGNOSIS — R945 Abnormal results of liver function studies: Principal | ICD-10-CM

## 2014-01-03 DIAGNOSIS — Z1211 Encounter for screening for malignant neoplasm of colon: Secondary | ICD-10-CM | POA: Insufficient documentation

## 2014-01-03 DIAGNOSIS — R7989 Other specified abnormal findings of blood chemistry: Secondary | ICD-10-CM

## 2014-01-03 NOTE — Progress Notes (Signed)
Primary Care Physician: Jani Gravel, MD  Primary Gastroenterologist:  Garfield Cornea, MD   Chief Complaint  Patient presents with  . Follow-up    HPI: Mark [name] is a 68 y.o. male here for followup of elevated LFTs. Last seen back in January 2015. At that time he had an isolated elevated Tbili of 2.7, conjugated bili result not done. Other LFT parameters were normal.   Recent ER evaluation of couple day h/o right sided flank pain. CT A/P with contrast showed stable hepatic cyst but otherwise unremarkable. LFTs were normal. Lipase normal. Platelets normal. There is no indication of hepatic dysfunction. I question possibility that Total bili up due to Gilbert's disease although this could not be confirmed without conjugated bili.   Currently patient has no further abdominal pain. No heartburn. Good appetite. No dysphagia. BM regular. No melena, brbpr. Discussed colonoscopy with patient and wife. Given his mobility issues and difficult nature of bowel prep, they are not interested in pursuing colonoscopy. We discussed possibility of stool DNA testing to detect colon cancer vs iFOBT to detect blood in stool. They are interested in either of these options.   Current Outpatient Prescriptions  Medication Sig Dispense Refill  . amLODipine (NORVASC) 5 MG tablet Take 5 mg by mouth daily.      . carbidopa-levodopa (SINEMET) 25-100 MG per tablet Take 1 tablet by mouth 3 (three) times daily.       . carvedilol (COREG) 3.125 MG tablet Take 1 tablet (3.125 mg total) by mouth 2 (two) times daily with a meal.  60 tablet  6  . cloNIDine (CATAPRES - DOSED IN MG/24 HR) 0.1 mg/24hr patch Place 0.1 mg onto the skin once a week.      . clopidogrel (PLAVIX) 75 MG tablet Take 1 tablet (75 mg total) by mouth daily.  30 tablet  6  . colchicine 0.6 MG tablet Take 0.6 mg by mouth daily.      . diphenhydrAMINE (SOMINEX) 25 MG tablet Take 25 mg by mouth at bedtime as needed for sleep.      Marland Kitchen docusate sodium  (COLACE) 100 MG capsule Take 100 mg by mouth 2 (two) times daily.      . famotidine (PEPCID) 20 MG tablet Take 20 mg by mouth as needed for heartburn or indigestion.      . furosemide (LASIX) 20 MG tablet Take 20 mg by mouth daily.      Marland Kitchen HYDROcodone-acetaminophen (NORCO) 7.5-325 MG per tablet Take 1 tablet by mouth every 6 (six) hours as needed for moderate pain.      Marland Kitchen ipratropium-albuterol (DUONEB) 0.5-2.5 (3) MG/3ML SOLN Take 3 mLs by nebulization every 6 (six) hours as needed.      . iron polysaccharides (NIFEREX) 150 MG capsule Take 1 capsule (150 mg total) by mouth 2 (two) times daily.  60 capsule  0  . levETIRAcetam (KEPPRA) 250 MG tablet Take 250 mg by mouth Twice daily.       Marland Kitchen LORazepam (ATIVAN) 1 MG tablet Take 1 mg by mouth daily.      . nitroGLYCERIN (NITROSTAT) 0.4 MG SL tablet Place 0.4 mg under the tongue every 5 (five) minutes as needed for chest pain.      Marland Kitchen oxymetazoline (AFRIN) 0.05 % nasal spray Place 1 spray into both nostrils as needed for congestion.      . polyethylene glycol (MIRALAX / GLYCOLAX) packet Take 17 g by mouth daily.      Marland Kitchen  rosuvastatin (CRESTOR) 20 MG tablet Take 1 tablet (20 mg total) by mouth daily.  30 tablet  6  . senna-docusate (SENOKOT-S) 8.6-50 MG per tablet Take 1 tablet by mouth 2 (two) times daily.      . sertraline (ZOLOFT) 25 MG tablet Take 75 mg by mouth daily.       . traMADol (ULTRAM) 50 MG tablet Take 50 mg by mouth 2 (two) times daily.       . vitamin B-12 (CYANOCOBALAMIN) 1000 MCG tablet Take 2,000 mcg by mouth daily.       No current facility-administered medications for this visit.    Allergies as of 01/03/2014 - Review Complete 01/03/2014  Allergen Reaction Noted  . Ciprofloxacin Rash 06/25/2012    ROS:  General: Negative for anorexia, weight loss, fever, chills, fatigue, weakness. ENT: Negative for hoarseness, difficulty swallowing , nasal congestion. CV: Negative for chest pain, angina, palpitations, dyspnea on exertion,  peripheral edema.  Respiratory: Negative for dyspnea at rest, dyspnea on exertion, cough, sputum, wheezing.  GI: See history of present illness. GU:  Negative for dysuria, hematuria, urinary incontinence, urinary frequency, nocturnal urination.  Endo: Negative for unusual weight change.    Physical Examination:   BP 145/83  Pulse 82  Temp(Src) 97.6 F (36.4 C) (Oral)  Ht 6\' 1"  (1.854 m)  Wt 255 lb (115.667 kg)  BMI 33.65 kg/m2  General: Well-nourished, well-developed in no acute distress. Accompanied by wife. Wheelchair bound.  Eyes: No icterus. Mouth: Oropharyngeal mucosa moist and pink , no lesions erythema or exudate. Lungs: Clear to auscultation bilaterally.  Heart: Regular rate and rhythm, no murmurs rubs or gallops.  Abdomen: Bowel sounds are normal, nontender, nondistended. Exam limited due to inability for him to get on exam table.   Extremities: No lower extremity edema. No clubbing or deformities. Neuro: Alert and oriented x 4   Skin: Warm and dry, no jaundice.   Psych: Alert and cooperative, normal mood and affect.  Labs:  Lab Results  Component Value Date   WBC 11.0* 12/26/2013   HGB 14.6 12/26/2013   HCT 42.7 12/26/2013   MCV 90.1 12/26/2013   PLT 267 12/26/2013   Lab Results  Component Value Date   CREATININE 1.26 12/26/2013   BUN 20 12/26/2013   NA 142 12/26/2013   K 4.3 12/26/2013   CL 103 12/26/2013   CO2 26 12/26/2013   Lab Results  Component Value Date   ALT <5 12/26/2013   AST 18 12/26/2013   ALKPHOS 86 12/26/2013   BILITOT 0.3 12/26/2013   Lab Results  Component Value Date   LIPASE 33 12/26/2013    Imaging Studies: Ct Abdomen Pelvis W Contrast  12/26/2013   CLINICAL DATA:  Right-sided abdominal pain.  Nausea  EXAM: CT ABDOMEN AND PELVIS WITH CONTRAST  TECHNIQUE: Multidetector CT imaging of the abdomen and pelvis was performed using the standard protocol following bolus administration of intravenous contrast.  CONTRAST:  121mL OMNIPAQUE IOHEXOL 300 MG/ML  SOLN, 66mL OMNIPAQUE IOHEXOL 300 MG/ML SOLN  COMPARISON:  CT abdomen 06/22/2012  FINDINGS: Small right pleural effusion.  Mild atelectasis in the lung bases.  4.2 cm cyst in the lateral segment of the left lobe of the liver is stable. Remainder of the liver is normal. Gallbladder and bile ducts appear normal. Pancreas spleen and kidneys are normal.  Negative for bowel obstruction or bowel thickening. Appendix is normal.  Negative for mass or adenopathy. No free fluid. No acute bony abnormality. Multiple chronic fractures in  the lower thoracic spine.  IMPRESSION: No acute abnormality.  Normal appendix.   Electronically Signed   By: Franchot Gallo M.D.   On: 12/26/2013 08:54   Dg Chest Port 1 View  12/26/2013   CLINICAL DATA:  Flank pain, hypertension  EXAM: PORTABLE CHEST - 1 VIEW  COMPARISON:  DG CHEST 1V PORT dated 04/06/2013  FINDINGS: There are low lung volumes. There is crowding of the interstitial markings. There is no focal parenchymal opacity, pleural effusion, or pneumothorax. The heart and mediastinal contours are unremarkable.  The osseous structures are unremarkable.  IMPRESSION: No active disease.   Electronically Signed   By: Kathreen Devoid   On: 12/26/2013 08:02

## 2014-01-03 NOTE — Assessment & Plan Note (Signed)
H/O elevated Tbili several months ago. Recent CT showed normal liver outside of stable cyst. LFTs have been repeated normal since that time. No indication of hepatic dysfunction. ?Gilbert's syndrome as etiology of isolated hyperbilirubinemia but would have had to have conjugated bili to confirm this. At this time, no further w/u needed. Will recheck LFTs in six months.

## 2014-01-03 NOTE — Progress Notes (Signed)
cc'd to pcp 

## 2014-01-03 NOTE — Assessment & Plan Note (Signed)
Consider fecal DNA testing for colon cancer vs iFOBT. To discuss with Dr. Gala Romney. Patient not interested in pursuing colonoscopy for "screening" purposes.

## 2014-01-03 NOTE — Patient Instructions (Signed)
1. Recheck liver blood work in 6 months. 2. I will call and let you know if Dr. Gala Romney wants to do stool sampling for colon cancer testing as discussed today.

## 2014-01-08 ENCOUNTER — Other Ambulatory Visit: Payer: Self-pay

## 2014-01-08 DIAGNOSIS — R945 Abnormal results of liver function studies: Principal | ICD-10-CM

## 2014-01-08 DIAGNOSIS — R7989 Other specified abnormal findings of blood chemistry: Secondary | ICD-10-CM

## 2014-02-14 ENCOUNTER — Telehealth: Payer: Self-pay | Admitting: Gastroenterology

## 2014-02-14 NOTE — Telephone Encounter (Signed)
Let's offer this patient Cologuard testing as colon cancer screening maneuver. Patient not interested in colonoscopy for screening due to mobility issues/concerns but was interested in [facility] screening as discussed at office visit.

## 2014-02-23 NOTE — Telephone Encounter (Signed)
Got cologuard information from [employer] (rep for cologaurd) tried to call pt- LMOM

## 2014-02-27 NOTE — Telephone Encounter (Signed)
Called pt- LMOM 

## 2014-03-02 NOTE — Telephone Encounter (Signed)
Tried to call pt- LMOM 

## 2014-03-06 NOTE — Telephone Encounter (Signed)
Tried to call pt- LMOM 

## 2014-03-08 NOTE — Telephone Encounter (Signed)
Mailed letter to pt asking him to call the office.

## 2014-03-15 NOTE — Telephone Encounter (Signed)
Cologuard test paperwork needs to be signed by patient or his POA. I have mailed the paperwork to his wife to sign and mail back to me.

## 2014-03-15 NOTE — Telephone Encounter (Signed)
I spoke with pts wife. She said it was ok to send in request for cologaurd. She will take it to [employer] when she receives it and make sure it gets done.

## 2014-03-16 NOTE — Telephone Encounter (Signed)
noted 

## 2014-04-11 NOTE — Telephone Encounter (Signed)
pts wife brought in signed paperwork for cologuard. I have faxed these to the company.

## 2014-04-16 NOTE — Telephone Encounter (Signed)
Noted  

## 2014-06-20 ENCOUNTER — Other Ambulatory Visit: Payer: Self-pay

## 2014-06-20 DIAGNOSIS — R945 Abnormal results of liver function studies: Principal | ICD-10-CM

## 2014-06-20 DIAGNOSIS — R7989 Other specified abnormal findings of blood chemistry: Secondary | ICD-10-CM

## 2014-08-01 ENCOUNTER — Telehealth: Payer: Self-pay | Admitting: Gastroenterology

## 2014-08-01 NOTE — Telephone Encounter (Signed)
Can we find out if patient ever had the Cologuard testing?

## 2014-08-22 ENCOUNTER — Emergency Department (HOSPITAL_COMMUNITY): Payer: Commercial Managed Care - HMO

## 2014-08-22 ENCOUNTER — Observation Stay (HOSPITAL_COMMUNITY)
Admission: EM | Admit: 2014-08-22 | Discharge: 2014-08-22 | Disposition: A | Discharge status: Home / Self Care | Payer: Commercial Managed Care - HMO | Attending: Internal Medicine | Admitting: Internal Medicine

## 2014-08-22 ENCOUNTER — Encounter (HOSPITAL_COMMUNITY): Payer: Self-pay | Admitting: Emergency Medicine

## 2014-08-22 DIAGNOSIS — I209 Angina pectoris, unspecified: Secondary | ICD-10-CM

## 2014-08-22 DIAGNOSIS — Z8781 Personal history of (healed) traumatic fracture: Secondary | ICD-10-CM | POA: Diagnosis not present

## 2014-08-22 DIAGNOSIS — I10 Essential (primary) hypertension: Secondary | ICD-10-CM | POA: Diagnosis not present

## 2014-08-22 DIAGNOSIS — Z8673 Personal history of transient ischemic attack (TIA), and cerebral infarction without residual deficits: Secondary | ICD-10-CM | POA: Insufficient documentation

## 2014-08-22 DIAGNOSIS — Z85828 Personal history of other malignant neoplasm of skin: Secondary | ICD-10-CM | POA: Diagnosis not present

## 2014-08-22 DIAGNOSIS — M1990 Unspecified osteoarthritis, unspecified site: Secondary | ICD-10-CM | POA: Insufficient documentation

## 2014-08-22 DIAGNOSIS — I4891 Unspecified atrial fibrillation: Secondary | ICD-10-CM

## 2014-08-22 DIAGNOSIS — N200 Calculus of kidney: Secondary | ICD-10-CM | POA: Insufficient documentation

## 2014-08-22 DIAGNOSIS — G20 Parkinson's disease: Secondary | ICD-10-CM | POA: Insufficient documentation

## 2014-08-22 DIAGNOSIS — G40909 Epilepsy, unspecified, not intractable, without status epilepticus: Secondary | ICD-10-CM | POA: Diagnosis not present

## 2014-08-22 DIAGNOSIS — I25118 Atherosclerotic heart disease of native coronary artery with other forms of angina pectoris: Secondary | ICD-10-CM | POA: Insufficient documentation

## 2014-08-22 DIAGNOSIS — Z7902 Long term (current) use of antithrombotics/antiplatelets: Secondary | ICD-10-CM | POA: Diagnosis not present

## 2014-08-22 DIAGNOSIS — G4730 Sleep apnea, unspecified: Secondary | ICD-10-CM | POA: Diagnosis not present

## 2014-08-22 DIAGNOSIS — I480 Paroxysmal atrial fibrillation: Secondary | ICD-10-CM | POA: Diagnosis not present

## 2014-08-22 DIAGNOSIS — Z9861 Coronary angioplasty status: Secondary | ICD-10-CM | POA: Diagnosis not present

## 2014-08-22 DIAGNOSIS — R079 Chest pain, unspecified: Secondary | ICD-10-CM | POA: Diagnosis not present

## 2014-08-22 DIAGNOSIS — M109 Gout, unspecified: Secondary | ICD-10-CM | POA: Insufficient documentation

## 2014-08-22 DIAGNOSIS — Z79899 Other long term (current) drug therapy: Secondary | ICD-10-CM | POA: Diagnosis not present

## 2014-08-22 DIAGNOSIS — N10 Acute tubulo-interstitial nephritis: Secondary | ICD-10-CM | POA: Insufficient documentation

## 2014-08-22 DIAGNOSIS — I2510 Atherosclerotic heart disease of native coronary artery without angina pectoris: Secondary | ICD-10-CM | POA: Diagnosis not present

## 2014-08-22 DIAGNOSIS — I252 Old myocardial infarction: Secondary | ICD-10-CM | POA: Insufficient documentation

## 2014-08-22 DIAGNOSIS — R072 Precordial pain: Secondary | ICD-10-CM

## 2014-08-22 DIAGNOSIS — E785 Hyperlipidemia, unspecified: Secondary | ICD-10-CM | POA: Insufficient documentation

## 2014-08-22 LAB — LIPID PANEL
Cholesterol: 92 mg/dL (ref 0–200)
HDL: 28 mg/dL — AB (ref >39)
LDL Cholesterol: 40 mg/dL (ref 0–99)
TRIGLYCERIDES: 121 mg/dL (ref <150)
Total CHOL/HDL Ratio: 3.3 RATIO
VLDL: 24 mg/dL (ref 0–40)

## 2014-08-22 LAB — COMPREHENSIVE METABOLIC PANEL
ALT: 10 U/L (ref 0–53)
AST: 17 U/L (ref 0–37)
Albumin: 3.5 g/dL (ref 3.5–5.2)
Alkaline Phosphatase: 78 U/L (ref 39–117)
Anion gap: 12 (ref 5–15)
BUN: 19 mg/dL (ref 6–23)
CO2: 25 meq/L (ref 19–32)
Calcium: 9 mg/dL (ref 8.4–10.5)
Chloride: 104 mEq/L (ref 96–112)
Creatinine, Ser: 1.2 mg/dL (ref 0.50–1.35)
GFR calc non Af Amer: 60 mL/min — ABNORMAL LOW (ref >90)
GFR, EST AFRICAN AMERICAN: 70 mL/min — AB (ref >90)
Glucose, Bld: 109 mg/dL — ABNORMAL HIGH (ref 70–99)
Potassium: 3.9 mEq/L (ref 3.7–5.3)
Sodium: 141 mEq/L (ref 137–147)
Total Bilirubin: 0.3 mg/dL (ref 0.3–1.2)
Total Protein: 7 g/dL (ref 6.0–8.3)

## 2014-08-22 LAB — CBC WITH DIFFERENTIAL/PLATELET
Basophils Absolute: 0 10*3/uL (ref 0.0–0.1)
Basophils Relative: 1 % (ref 0–1)
Eosinophils Absolute: 0.4 10*3/uL (ref 0.0–0.7)
Eosinophils Relative: 6 % — ABNORMAL HIGH (ref 0–5)
HCT: 38.9 % — ABNORMAL LOW (ref 39.0–52.0)
Hemoglobin: 13.1 g/dL (ref 13.0–17.0)
LYMPHS ABS: 2.4 10*3/uL (ref 0.7–4.0)
Lymphocytes Relative: 36 % (ref 12–46)
MCH: 30.9 pg (ref 26.0–34.0)
MCHC: 33.7 g/dL (ref 30.0–36.0)
MCV: 91.7 fL (ref 78.0–100.0)
Monocytes Absolute: 0.6 10*3/uL (ref 0.1–1.0)
Monocytes Relative: 9 % (ref 3–12)
NEUTROS ABS: 3.2 10*3/uL (ref 1.7–7.7)
NEUTROS PCT: 48 % (ref 43–77)
PLATELETS: 239 10*3/uL (ref 150–400)
RBC: 4.24 MIL/uL (ref 4.22–5.81)
RDW: 13.1 % (ref 11.5–15.5)
WBC: 6.6 10*3/uL (ref 4.0–10.5)

## 2014-08-22 LAB — TROPONIN I
Troponin I: <0.3 ng/mL (ref <0.30)
Troponin I: <0.3 ng/mL (ref <0.30)

## 2014-08-22 LAB — HEMOGLOBIN A1C
Hgb A1c MFr Bld: 5.5 % (ref <5.7)
Mean Plasma Glucose: 111 mg/dL (ref <117)

## 2014-08-22 LAB — MRSA PCR SCREENING: MRSA by PCR: NEGATIVE

## 2014-08-22 LAB — D-DIMER, QUANTITATIVE (NOT AT ARMC): D DIMER QUANT: 0.33 ug{FEU}/mL (ref 0.00–0.48)

## 2014-08-22 MED ORDER — NITROGLYCERIN 0.4 MG/HR TD PT24
0.4000 mg | MEDICATED_PATCH | Freq: Every day | TRANSDERMAL | Status: DC
  Administered 2014-08-22: 0.4 mg via TRANSDERMAL
  Filled 2014-08-22 (×3): qty 1

## 2014-08-22 MED ORDER — SODIUM CHLORIDE 0.9 % IV SOLN: INTRAVENOUS | Status: DC

## 2014-08-22 MED ORDER — SODIUM CHLORIDE 0.9 % IJ SOLN
3.0000 mL | Freq: Two times a day (BID) | INTRAMUSCULAR | Status: DC
  Administered 2014-08-22: 3 mL via INTRAVENOUS

## 2014-08-22 MED ORDER — MORPHINE SULFATE 2 MG/ML IJ SOLN: 1.0000 mg | INTRAMUSCULAR | Status: DC | PRN

## 2014-08-22 MED ORDER — ISOSORBIDE MONONITRATE ER 60 MG PO TB24
30.0000 mg | ORAL_TABLET | Freq: Every day | ORAL | Status: DC
  Administered 2014-08-22: 30 mg via ORAL
  Filled 2014-08-22: qty 1

## 2014-08-22 MED ORDER — SERTRALINE HCL 50 MG PO TABS
75.0000 mg | ORAL_TABLET | Freq: Every day | ORAL | Status: DC
  Administered 2014-08-22: 75 mg via ORAL
  Filled 2014-08-22: qty 2

## 2014-08-22 MED ORDER — ROSUVASTATIN CALCIUM 20 MG PO TABS
20.0000 mg | ORAL_TABLET | Freq: Every day | ORAL | Status: DC
  Administered 2014-08-22: 20 mg via ORAL
  Filled 2014-08-22: qty 1

## 2014-08-22 MED ORDER — NITROGLYCERIN IN D5W 200-5 MCG/ML-% IV SOLN
5.0000 ug/min | INTRAVENOUS | Status: DC
  Filled 2014-08-22: qty 250

## 2014-08-22 MED ORDER — IPRATROPIUM-ALBUTEROL 0.5-2.5 (3) MG/3ML IN SOLN
3.0000 mL | Freq: Four times a day (QID) | RESPIRATORY_TRACT | Status: DC | PRN
Start: 2014-08-22 — End: 2014-08-22

## 2014-08-22 MED ORDER — COLCHICINE 0.6 MG PO TABS
0.6000 mg | ORAL_TABLET | Freq: Every day | ORAL | Status: DC
  Administered 2014-08-22: 0.6 mg via ORAL
  Filled 2014-08-22: qty 1

## 2014-08-22 MED ORDER — ASPIRIN EC 325 MG PO TBEC
325.0000 mg | DELAYED_RELEASE_TABLET | Freq: Every day | ORAL | Status: DC
  Administered 2014-08-22: 325 mg via ORAL
  Filled 2014-08-22: qty 1

## 2014-08-22 MED ORDER — CARVEDILOL 3.125 MG PO TABS
3.1250 mg | ORAL_TABLET | Freq: Two times a day (BID) | ORAL | Status: DC
  Administered 2014-08-22 (×2): 3.125 mg via ORAL
  Filled 2014-08-22 (×2): qty 1

## 2014-08-22 MED ORDER — CLONIDINE HCL 0.1 MG/24HR TD PTWK: 0.1000 mg | MEDICATED_PATCH | TRANSDERMAL | Status: DC

## 2014-08-22 MED ORDER — LORAZEPAM 1 MG PO TABS
1.0000 mg | ORAL_TABLET | Freq: Every day | ORAL | Status: DC
  Administered 2014-08-22: 1 mg via ORAL
  Filled 2014-08-22: qty 1

## 2014-08-22 MED ORDER — ISOSORBIDE MONONITRATE ER 30 MG PO TB24: 30.0000 mg | ORAL_TABLET | Freq: Every day | ORAL | Status: DC

## 2014-08-22 MED ORDER — CARBIDOPA-LEVODOPA 25-100 MG PO TABS
1.0000 | ORAL_TABLET | Freq: Three times a day (TID) | ORAL | Status: DC
  Administered 2014-08-22 (×2): 1 via ORAL
  Filled 2014-08-22 (×2): qty 1

## 2014-08-22 MED ORDER — CLOPIDOGREL BISULFATE 75 MG PO TABS
75.0000 mg | ORAL_TABLET | Freq: Every day | ORAL | Status: DC
  Administered 2014-08-22: 75 mg via ORAL
  Filled 2014-08-22: qty 1

## 2014-08-22 MED ORDER — ONDANSETRON HCL 4 MG/2ML IJ SOLN
4.0000 mg | Freq: Once | INTRAMUSCULAR | Status: AC
  Administered 2014-08-22: 4 mg via INTRAVENOUS
  Filled 2014-08-22: qty 2

## 2014-08-22 MED ORDER — LEVETIRACETAM 250 MG PO TABS
250.0000 mg | ORAL_TABLET | Freq: Two times a day (BID) | ORAL | Status: DC
  Administered 2014-08-22: 250 mg via ORAL
  Filled 2014-08-22: qty 1

## 2014-08-22 MED ORDER — AMLODIPINE BESYLATE 5 MG PO TABS
5.0000 mg | ORAL_TABLET | Freq: Every day | ORAL | Status: DC
  Administered 2014-08-22: 5 mg via ORAL
  Filled 2014-08-22: qty 1

## 2014-08-22 MED ORDER — POLYETHYLENE GLYCOL 3350 17 G PO PACK
17.0000 g | PACK | Freq: Every day | ORAL | Status: DC
  Administered 2014-08-22: 17 g via ORAL
  Filled 2014-08-22: qty 1

## 2014-08-22 MED ORDER — MORPHINE SULFATE 4 MG/ML IJ SOLN
4.0000 mg | Freq: Once | INTRAMUSCULAR | Status: AC
  Administered 2014-08-22: 4 mg via INTRAVENOUS
  Filled 2014-08-22: qty 1

## 2014-08-22 MED ORDER — DOCUSATE SODIUM 100 MG PO CAPS
100.0000 mg | ORAL_CAPSULE | Freq: Two times a day (BID) | ORAL | Status: DC
  Administered 2014-08-22: 100 mg via ORAL
  Filled 2014-08-22: qty 1

## 2014-08-22 MED ORDER — FUROSEMIDE 20 MG PO TABS
20.0000 mg | ORAL_TABLET | Freq: Every day | ORAL | Status: DC
  Administered 2014-08-22: 20 mg via ORAL
  Filled 2014-08-22: qty 1

## 2014-08-22 NOTE — ED Notes (Signed)
IV infiltrated, EDP notified.

## 2014-08-22 NOTE — ED Notes (Signed)
EDP at bedside  

## 2014-08-22 NOTE — Evaluation (Signed)
Physical Therapy Evaluation Patient Details Name: Mark [name] MRN: [id number] DOB: [birth year] Today's Date: 08/22/2014   History of Present Illness  This is a 68 y.o. year old male with significant past medical history of CAD s/p MI, parkinsons, CVA, HTN, paroxysmal afib  presenting with chest pain. Pt resident of local SNF. Patient reports episode of severe chest pain that woke him up from sleep earlier today. Patient states that he felt like he was dying. Pain was greater than tablet 10 in terms of intensity. Central in nature with radiation down left arm. No associated nausea or diaphoresis. EMS subsequently called. Patient was given nitroglycerin as well as full dose aspirin with minimal to mild improvement in symptoms. Noted to have been admitted October 2012 for an STEMI. Had catheterization reported to show severe 3 vessel disease with recommendation for aggressive medical management as patient was not felt to be a surgical candidate.  Presented to ER temperature 90.8, heart rate in the 50s to 70s, respirations in the tens, blood pressure in the 110s to 130s, satting greater than 95% on room air. White blood cell count 6.6, hemoglobin 13.1, creatinine 1.2. Troponin negative 2. EKG normal sinus rhythm. Chest x-ray within normal limits. Was given morphine in the ER with moderate amount of improvement and chest pain. Chest pain still present. Also some pain in the right arm.  Clinical Impression  Pt is a 68 year old male who presents to PT for assessment of functional mobility skills.  Per chart, pt lives at a SNF where he plans to return at discharge.  No complaints of chest pain at this time, though pt does report generalized joint pain; pt reports he received his pain medication this morning.  Secondary to PD, pt has flat affect, soft voice, and slow response time with verbal and physical responses.  Pt reports at the facility he requires varying levels of assist from 1-2 person assist and use  of mechanical lift as needed.  Pt does report he was able to walk from bed room to bathroom 1 1/2 weeks ago, though pt seems unsure of date.  During evaluation, pt was max assist x1 to transfer to EOB with HOB elevated and max assist x2 to transfer to standing.  Pt does have retropulsion when in standing, and despite assist and VC pt unable to stand upright to attempt to lift feet for gait.  Standing attempted x2 and pt unable to stand upright or lift feet on either occasion.  Pt required assist x2 to return to bed and scoot up in bed.  Recommend continued PT while in the hospital, as pt reports he was able to walk recently, though unsure about prognosis with PT as pt did require max assist x2 to attempt standing.   Spoke with RN and recommended use of mechanical lift for OOB activities.        Follow Up Recommendations SNF    Equipment Recommendations  None recommended by PT    Recommendations for Other Services OT consult     Precautions / Restrictions Precautions Precautions: Fall Precaution Comments: Hx of PD Restrictions Weight Bearing Restrictions: No      Mobility  Bed Mobility Overal bed mobility: Needs Assistance Bed Mobility: Supine to Sit;Sit to Supine Rolling: Mod assist (Mod assist to initate roll, though with use of handrail pt able to maintain position )   Supine to sit: Max assist; HOB elevated Sit to supine: +2 for physical assistance;Total assist      Transfers  Overall transfer level: Needs assistance Equipment used: Rolling walker (2 wheeled) Transfers: Sit to/from Stand Sit to Stand: +2 physical assistance         General transfer comment: Pt required assist x2 to transfer sit->stand and assist x2 to maintain standing secondary to retropulsion.  Pt unable to stand fully upright (leans backward) despite assist x2 and VC for technique.   Ambulation/Gait             General Gait Details: Pt unable to clear feet from floor to attempt gait.       Balance Overall balance assessment: Needs assistance Sitting-balance support: Feet supported;Bilateral upper extremity supported Sitting balance-Leahy Scale: Fair Sitting balance - Comments: Pt required assist x1 to correct with LOB in sitting (pt unable to self correct).    Standing balance support: Bilateral upper extremity supported;During functional activity Standing balance-Leahy Scale: Poor Standing balance comment: Pt required assist x2 to maintain standing secondary to retropulsion.                              Pertinent Vitals/Pain Pain Assessment: 0-10 Pain Score: 7  Pain Location: Chronic generalized joint pain Pain Intervention(s): Limited activity within patient's tolerance;Repositioned;Premedicated before session (Per pt, he has recieved pain medication this morning)    Home Living Family/patient expects to be discharged to:: Skilled nursing facility                 Additional Comments: Pt is a long term care resident at a SNF.    Prior Function           Comments: Per pt, he requires assist x1-2 and/or mechanical lift as needed.  Pt reprots he is able to assist with functional mobility skills "sometimes".  Pt reports he was able to amb to bathroom 1 1/2 weeks ago (unknown if this is correct).      Hand Dominance   Dominant Hand: Right    Extremity/Trunk Assessment               Lower Extremity Assessment: Generalized weakness         Communication   Communication: No difficulties  Cognition Arousal/Alertness: Awake/alert Behavior During Therapy: Flat affect Overall Cognitive Status: Within Functional Limits for tasks assessed                       Assessment/Plan    PT Assessment Patient needs continued PT services  PT Diagnosis Difficulty walking;Generalized weakness   PT Problem List Decreased strength;Decreased range of motion;Decreased activity tolerance;Decreased balance;Decreased mobility  PT Treatment  Interventions Balance training;Gait training;Functional mobility training;Therapeutic activities;Therapeutic exercise   PT Goals (Current goals can be found in the Care Plan section) Acute Rehab PT Goals Patient Stated Goal: none stated PT Goal Formulation: With patient    Frequency Min 2X/week    End of Session Equipment Utilized During Treatment: Gait belt Activity Tolerance: Patient limited by fatigue Patient left: in bed;with call bell/phone within reach;with bed alarm set;with family/visitor present Nurse Communication: Need for lift equipment;Mobility status    Functional Assessment Tool Used: Clinical Judgement Functional Limitation: Changing and maintaining body position Changing and Maintaining Body Position Current Status (P5361): 100 percent impaired, limited or restricted Changing and Maintaining Body Position Goal Status (W4315): At least 80 percent but less than 100 percent impaired, limited or restricted    Time: 4008-6761 PT Time Calculation (min) (ACUTE ONLY): 22 min   Charges:  PT Evaluation $Initial PT Evaluation Tier I: 1 Procedure     PT G Codes:   Functional Assessment Tool Used: Clinical Judgement Functional Limitation: Changing and maintaining body position    Chayse Gracey 08/22/2014, 1:17 PM

## 2014-08-22 NOTE — ED Notes (Signed)
Per EMS pt. From [facility] c/o chest pain. Pt. Received 324 ASA and 1 nitro en route.

## 2014-08-22 NOTE — ED Notes (Signed)
Verbal order from Dr. Ernestina Patches not to give nitro drip. Advised he would like nitro paste placed on pt. Stated that he would put order in computer.

## 2014-08-22 NOTE — Care Management Note (Signed)
    Page 1 of 1   08/22/2014     2:14:25 PM CARE MANAGEMENT NOTE 08/22/2014  Patient:  Mark [name], Mark [name]   Account Number:  [id number]  Date Initiated:  08/22/2014  Documentation initiated by:  Theophilus Kinds  Subjective/Objective Assessment:   Pt admitted from [facility] with CP. Pt will return to facility at discharge.     Action/Plan:   CSW is aware and will arrange discharge to facility today.   Anticipated DC Date:  08/22/2014   Anticipated DC Plan:  SKILLED NURSING FACILITY  In-house referral  Clinical Social Worker      DC Planning Services  CM consult      Choice offered to / List presented to:             Status of service:  Completed, signed off Medicare Important Message given?   (If response is "NO", the following Medicare IM given date fields will be blank) Date Medicare IM given:   Medicare IM given by:   Date Additional Medicare IM given:   Additional Medicare IM given by:    Discharge Disposition:  [place]  Per UR Regulation:    If discussed at Long Length of Stay Meetings, dates discussed:    Comments:  08/22/14 [place], RN BSN CM

## 2014-08-22 NOTE — ED Provider Notes (Signed)
CSN: [id number]     Arrival date & time 08/22/14  0129 History   First MD Initiated Contact with Patient 08/22/14 [phone]     Chief Complaint  Patient presents with  . Chest Pain     (Consider location/radiation/quality/duration/timing/severity/associated sxs/prior Treatment) HPI  Patient reports he was sleeping and about midnight he was awakened with left sided chest pain that radiates into his left arm. He describes the pain as pressure. He denies shortness of breath and states he may have had some mild diaphoresis. He had nausea without vomiting. Patient states "I feel like I'm going to die". He states he's had this pain before when he was having heart problems. He states he believes he has 1 stent. He is currently still taking his Plavix. He states his pain at its worse tonight was 9 out of 10, currently it's an 8 or 9 out of 10. EMS was called and gave him aspirin 325 mg plus one nitroglycerin which he states helped his pain. His any pain in his legs.  PCP Dr Maudie Mercury  [facility] [place]  Past Medical History  Diagnosis Date  . Hypertension   . PAF (paroxysmal atrial fibrillation)   . Parkinson's disease   . Gout   . Coronary artery disease     SEVERE 3-VESSEL DX WITH NORMAL EF  . Fall   . Fracture of right olecranon process 10/23/2011  . Arthritis   . Stroke 10/12    Hx of remote stroke, RT SIDED WEAKNESS  . Non-ST elevated myocardial infarction (non-STEMI)   . MI (myocardial infarction) 08/2011    /H&P (05/12/2012)  . Sleep apnea   . Seizures 12/10/2011    HAD SEIZURE  . Skin cancer     left shoulder  . Bacteremia due to Escherichia coli 06/21/2012  . Pyelonephritis, acute 06/23/2012  . 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  . Kidney stones   . Cervical vertebral fracture    Past Surgical History  Procedure Laterality Date  . Transthoracic echocardiogram  06/26/2011    EF 55-60%  . Orif elbow fracture  12/29/2011    Procedure: OPEN REDUCTION INTERNAL FIXATION (ORIF)  ELBOW/OLECRANON FRACTURE;  Surgeon: Johnny Bridge, MD;  Location: [place];  Service: Orthopedics;  Laterality: Right;  . Hardware removal  12/29/2011    Procedure: HARDWARE REMOVAL;  Surgeon: Johnny Bridge, MD;  Location: [place];  Service: Orthopedics;  Laterality: Right;  . Parotidectomy  05/12/2012    right  . Cardiac catheterization  06/26/2011    NORMAL LV SYSTOLIC FUNCTION. RCA IS LARGE AND DOMINANT  . Coronary angioplasty with stent placement  ~ 2000    "one that I know of"  . Skin cancer excision  2013    left shoulder  . Parotidectomy  05/12/2012    Procedure: PAROTIDECTOMY;  Surgeon: Rozetta Nunnery, MD;  Location: [place];  Service: ENT;  Laterality: Right;  PAROTIDECTOMY MASS   Family History  Problem Relation Age of Onset  . Heart disease    . Arthritis    . Colon cancer Neg Hx   . Liver disease Neg Hx    History  Substance Use Topics  . Smoking status: Never Smoker   . Smokeless tobacco: Never Used  . Alcohol Use: No  lives in a NH x 3 years   Review of Systems  All other systems reviewed and are negative.     Allergies  Ciprofloxacin  Home Medications   Prior to Admission medications  Medication Sig Start Date End Date Taking? Authorizing Provider  amLODipine (NORVASC) 5 MG tablet Take 5 mg by mouth daily.    Historical Provider, MD  carbidopa-levodopa (SINEMET) 25-100 MG per tablet Take 1 tablet by mouth 3 (three) times daily.  09/23/11   Historical Provider, MD  carvedilol (COREG) 3.125 MG tablet Take 1 tablet (3.125 mg total) by mouth 2 (two) times daily with a meal. 04/05/12   Ramond Dial, MD  cloNIDine (CATAPRES - DOSED IN MG/24 HR) 0.1 mg/24hr patch Place 0.1 mg onto the skin once a week.    Historical Provider, MD  clopidogrel (PLAVIX) 75 MG tablet Take 1 tablet (75 mg total) by mouth daily. 05/15/12   Rozetta Nunnery, MD  colchicine 0.6 MG tablet Take 0.6 mg by mouth daily.    Historical Provider, MD  diphenhydrAMINE (SOMINEX) 25 MG tablet  Take 25 mg by mouth at bedtime as needed for sleep.    Historical Provider, MD  docusate sodium (COLACE) 100 MG capsule Take 100 mg by mouth 2 (two) times daily.    Historical Provider, MD  famotidine (PEPCID) 20 MG tablet Take 20 mg by mouth as needed for heartburn or indigestion.    Historical Provider, MD  furosemide (LASIX) 20 MG tablet Take 20 mg by mouth daily.    Historical Provider, MD  HYDROcodone-acetaminophen (NORCO) 7.5-325 MG per tablet Take 1 tablet by mouth every 6 (six) hours as needed for moderate pain.    Historical Provider, MD  ipratropium-albuterol (DUONEB) 0.5-2.5 (3) MG/3ML SOLN Take 3 mLs by nebulization every 6 (six) hours as needed.    Historical Provider, MD  iron polysaccharides (NIFEREX) 150 MG capsule Take 1 capsule (150 mg total) by mouth 2 (two) times daily. 06/25/12   Kathie Dike, MD  levETIRAcetam (KEPPRA) 250 MG tablet Take 250 mg by mouth Twice daily.  09/23/11   Historical Provider, MD  LORazepam (ATIVAN) 1 MG tablet Take 1 mg by mouth daily.    Historical Provider, MD  nitroGLYCERIN (NITROSTAT) 0.4 MG SL tablet Place 0.4 mg under the tongue every 5 (five) minutes as needed for chest pain.    Historical Provider, MD  oxymetazoline (AFRIN) 0.05 % nasal spray Place 1 spray into both nostrils as needed for congestion.    Historical Provider, MD  polyethylene glycol (MIRALAX / GLYCOLAX) packet Take 17 g by mouth daily.    Historical Provider, MD  rosuvastatin (CRESTOR) 20 MG tablet Take 1 tablet (20 mg total) by mouth daily. 04/05/12   Ramond Dial, MD  senna-docusate (SENOKOT-S) 8.6-50 MG per tablet Take 1 tablet by mouth 2 (two) times daily.    Historical Provider, MD  sertraline (ZOLOFT) 25 MG tablet Take 75 mg by mouth daily.     Historical Provider, MD  traMADol (ULTRAM) 50 MG tablet Take 50 mg by mouth 2 (two) times daily.     Historical Provider, MD  vitamin B-12 (CYANOCOBALAMIN) 1000 MCG tablet Take 2,000 mcg by mouth daily.    Historical Provider, MD    BP 131/77 mmHg  Pulse 71  Temp(Src) 98 F (36.7 C) (Oral)  Resp 15  Ht 6\' 1"  (1.854 m)  Wt 250 lb (113.399 kg)  BMI 32.99 kg/m2  SpO2 96%  Vital signs normal   Physical Exam  Constitutional: He is oriented to person, place, and time. He appears well-developed and well-nourished.  Non-toxic appearance. He does not appear ill. No distress.  HENT:  Head: Normocephalic and atraumatic.  Right  Ear: External ear normal.  Left Ear: External ear normal.  Nose: Nose normal. No mucosal edema or rhinorrhea.  Mouth/Throat: Oropharynx is clear and moist and mucous membranes are normal. No dental abscesses or uvula swelling.  Eyes: Conjunctivae and EOM are normal. Pupils are equal, round, and reactive to light.  Neck: Normal range of motion and full passive range of motion without pain. Neck supple.  Cardiovascular: Normal rate, regular rhythm and normal heart sounds.  Exam reveals no gallop and no friction rub.   No murmur heard. Pulmonary/Chest: Effort normal and breath sounds normal. No respiratory distress. He has no wheezes. He has no rhonchi. He has no rales. He exhibits no tenderness and no crepitus.  Abdominal: Soft. Normal appearance and bowel sounds are normal. He exhibits no distension. There is no tenderness. There is no rebound and no guarding.  Musculoskeletal: Normal range of motion. He exhibits no edema or tenderness.  Nontender soft calves bilaterally  Neurological: He is alert and oriented to person, place, and time. He has normal strength. No cranial nerve deficit.  Skin: Skin is warm, dry and intact. No rash noted. No erythema. No pallor.  Psychiatric: His speech is delayed. He is slowed.  Patient has very flat facies  Nursing note and vitals reviewed.   ED Course  Procedures (including critical care time)  Medications  nitroGLYCERIN 50 mg in dextrose 5 % 250 mL (0.2 mg/mL) infusion (not administered)  morphine 4 MG/ML injection 4 mg (4 mg Intravenous Given 08/22/14  0220)  ondansetron (ZOFRAN) injection 4 mg (4 mg Intravenous Given 08/22/14 0220)  morphine 4 MG/ML injection 4 mg (4 mg Intravenous Given 08/22/14 0259)  ondansetron (ZOFRAN) injection 4 mg (4 mg Intravenous Given 08/22/14 0259)   Patient had a cardiac catheterization in October 2012. He had severe three-vessel coronary artery disease but had well-preserved left ventricular systolic function. At that time they elected to ED aggressive with his medical therapy as they did not feel he was a good surgical candidate. His cardiac cath was done by Dr. Acie Fredrickson.   Patient was given aspirin by EMS. He was given morphine in the ED with some improvement of his pain. He was started on a nitroglycerin drip. Patient had a second troponin done that was negative. I talked to patient and his wife. He is agreeable to admission to be evaluated by the cardiologist later today. Patient is on Plavix and was not started on heparin.  06:05 Dr Ernestina Patches, admit to tele, AP admit team  Labs Review Results for orders placed or performed during the hospital encounter of 08/22/14  Comprehensive metabolic panel  Result Value Ref Range   Sodium 141 137 - 147 mEq/L   Potassium 3.9 3.7 - 5.3 mEq/L   Chloride 104 96 - 112 mEq/L   CO2 25 19 - 32 mEq/L   Glucose, Bld 109 (H) 70 - 99 mg/dL   BUN 19 6 - 23 mg/dL   Creatinine, Ser 1.20 0.50 - 1.35 mg/dL   Calcium 9.0 8.4 - 10.5 mg/dL   Total Protein 7.0 6.0 - 8.3 g/dL   Albumin 3.5 3.5 - 5.2 g/dL   AST 17 0 - 37 U/L   ALT 10 0 - 53 U/L   Alkaline Phosphatase 78 39 - 117 U/L   Total Bilirubin 0.3 0.3 - 1.2 mg/dL   GFR calc non Af Amer 60 (L) >90 mL/min   GFR calc Af Amer 70 (L) >90 mL/min   Anion gap 12 5 - 15  CBC with Differential  Result Value Ref Range   WBC 6.6 4.0 - 10.5 K/uL   RBC 4.24 4.22 - 5.81 MIL/uL   Hemoglobin 13.1 13.0 - 17.0 g/dL   HCT 38.9 (L) 39.0 - 52.0 %   MCV 91.7 78.0 - 100.0 fL   MCH 30.9 26.0 - 34.0 pg   MCHC 33.7 30.0 - 36.0 g/dL   RDW 13.1  11.5 - 15.5 %   Platelets 239 150 - 400 K/uL   Neutrophils Relative % 48 43 - 77 %   Neutro Abs 3.2 1.7 - 7.7 K/uL   Lymphocytes Relative 36 12 - 46 %   Lymphs Abs 2.4 0.7 - 4.0 K/uL   Monocytes Relative 9 3 - 12 %   Monocytes Absolute 0.6 0.1 - 1.0 K/uL   Eosinophils Relative 6 (H) 0 - 5 %   Eosinophils Absolute 0.4 0.0 - 0.7 K/uL   Basophils Relative 1 0 - 1 %   Basophils Absolute 0.0 0.0 - 0.1 K/uL  Troponin I  Result Value Ref Range   Troponin I <0.30 <0.30 ng/mL  D-dimer, quantitative  Result Value Ref Range   D-Dimer, Quant 0.33 0.00 - 0.48 ug/mL-FEU  Troponin I  Result Value Ref Range   Troponin I <0.30 <0.30 ng/mL   Laboratory interpretation all normal     Imaging Review Dg Chest Port 1 View  08/22/2014   CLINICAL DATA:  Chest pain  EXAM: PORTABLE CHEST - 1 VIEW  COMPARISON:  12/26/2013  FINDINGS: Borderline cardiomegaly, stable from prior. Unchanged aortic and hilar contours. Shallow inspiration with interstitial crowding. There is no edema, consolidation, effusion, or pneumothorax. No osseous findings to explain chest pain.  IMPRESSION: No active disease.   Electronically Signed   By: Jorje Guild M.D.   On: 08/22/2014 03:46     EKG Interpretation   Date/Time:  Wednesday August 22 2014 01:43:14 EST Ventricular Rate:  75 PR Interval:  198 QRS Duration: 95 QT Interval:  387 QTC Calculation: 432 R Axis:   22 Text Interpretation:  Sinus rhythm Low voltage, precordial leads Baseline  wander in lead(s) V1 No significant change since last tracing 06 Apr 2013  Confirmed by Javaya Oregon  MD-I, Aydrian Halpin ([id number]) on 08/22/2014 1:50:34 AM      MDM   Final diagnoses:  Chest pain, unspecified chest pain type    Plan admission  Rolland Porter, MD, Alanson Aly, MD 08/22/14 [phone]

## 2014-08-22 NOTE — Progress Notes (Signed)
Nitro patch was discontinued after it had been placed on patient's right arm.  Nitro patch removed and site cleaned.  Will continue to monitor patient.

## 2014-08-22 NOTE — Discharge Summary (Signed)
Physician Discharge Summary  Mark [name] HUT:[id number] DOB: 15-Mar-1946 DOA: 08/22/2014  PCP: Jani Gravel, MD  Admit date: 08/22/2014 Discharge date: 08/22/2014  Time spent: 45 minutes  Recommendations for Outpatient Follow-up:  -Will be discharged back to SNF today.   Discharge Diagnoses:  Active Problems:   Chest pain   Coronary artery disease involving native coronary artery of native heart with other form of angina pectoris   Essential hypertension   Hyperlipidemia   Paroxysmal atrial fibrillation   Discharge Condition: Stable and improved  Filed Weights   08/22/14 0132 08/22/14 0710  Weight: 113.399 kg (250 lb) 113.9 kg (251 lb 1.7 oz)    History of present illness:  This is a 68 y.o. year old male with significant past medical history of CAD s/p MI, parkinsons, CVA, HTN, paroxysmal afib presenting with chest pain. Pt resident of local SNF. Patient reports episode of severe chest pain that woke him up from sleep earlier today. Patient states that he felt like he was dying. Pain was greater than tablet 10 in terms of intensity. Central in nature with radiation down left arm. No associated nausea or diaphoresis. EMS subsequently called. Patient was given nitroglycerin as well as full dose aspirin with minimal to mild improvement in symptoms. Noted to have been admitted October 2012 for an STEMI. Had catheterization reported to show severe 3 vessel disease with recommendation for aggressive medical management as patient was not felt to be a surgical candidate. Presented to ER temperature 90.8, heart rate in the 50s to 70s, respirations in the tens, blood pressure in the 110s to 130s, satting greater than 95% on room air. White blood cell count 6.6, hemoglobin 13.1, creatinine 1.2. Troponin negative 2. EKG normal sinus rhythm. Chest x-ray within normal limits. Was given morphine in the ER with moderate amount of improvement and chest pain. Chest pain still present. Also some  pain in the right arm.  Hospital Course:   Chest Pain -Patient with known severe 3 vessel CAD. -Appreciate cards consult and recommendations: cath from 2012 reviewed by cards who opines medical management only with addition of Imdur 30 mg daily. -No further cardiac testing to be pursued. -Continue coreg, norvasc, crestor, plavix.  Rest of chronic medical conditions stable and home medications have not changed.  Procedures:  None   Consultations:  Cardiology, Dr. Bronson Ing  Discharge Instructions  Discharge Instructions    Diet - low sodium heart healthy    Complete by:  As directed      Increase activity slowly    Complete by:  As directed             Medication List    TAKE these medications        amLODipine 5 MG tablet  Commonly known as:  NORVASC  Take 5 mg by mouth daily.     carbidopa-levodopa 25-100 MG per tablet  Commonly known as:  SINEMET IR  Take 1 tablet by mouth 3 (three) times daily.     carvedilol 3.125 MG tablet  Commonly known as:  COREG  Take 1 tablet (3.125 mg total) by mouth 2 (two) times daily with a meal.     cloNIDine 0.1 mg/24hr patch  Commonly known as:  CATAPRES - Dosed in mg/24 hr  Place 0.1 mg onto the skin once a week.     clopidogrel 75 MG tablet  Commonly known as:  PLAVIX  Take 1 tablet (75 mg total) by mouth daily.  colchicine 0.6 MG tablet  Take 0.6 mg by mouth daily.     diphenhydrAMINE 25 MG tablet  Commonly known as:  SOMINEX  Take 25 mg by mouth at bedtime as needed for sleep.     docusate sodium 100 MG capsule  Commonly known as:  COLACE  Take 100 mg by mouth 2 (two) times daily.     famotidine 20 MG tablet  Commonly known as:  PEPCID  Take 20 mg by mouth as needed for heartburn or indigestion.     furosemide 20 MG tablet  Commonly known as:  LASIX  Take 20 mg by mouth daily.     HYDROcodone-acetaminophen 7.5-325 MG per tablet  Commonly known as:  NORCO  Take 1 tablet by mouth every 6 (six) hours as  needed for moderate pain.     ipratropium-albuterol 0.5-2.5 (3) MG/3ML Soln  Commonly known as:  DUONEB  Take 3 mLs by nebulization every 6 (six) hours as needed.     iron polysaccharides 150 MG capsule  Commonly known as:  NIFEREX  Take 1 capsule (150 mg total) by mouth 2 (two) times daily.     isosorbide mononitrate 30 MG 24 hr tablet  Commonly known as:  IMDUR  Take 1 tablet (30 mg total) by mouth daily.     levETIRAcetam 250 MG tablet  Commonly known as:  KEPPRA  Take 250 mg by mouth Twice daily.     LORazepam 1 MG tablet  Commonly known as:  ATIVAN  Take 1 mg by mouth daily.     nitroGLYCERIN 0.4 MG SL tablet  Commonly known as:  NITROSTAT  Place 0.4 mg under the tongue every 5 (five) minutes as needed for chest pain.     oxymetazoline 0.05 % nasal spray  Commonly known as:  AFRIN  Place 1 spray into both nostrils as needed for congestion.     polyethylene glycol packet  Commonly known as:  MIRALAX / GLYCOLAX  Take 17 g by mouth daily.     rosuvastatin 20 MG tablet  Commonly known as:  CRESTOR  Take 1 tablet (20 mg total) by mouth daily.     senna-docusate 8.6-50 MG per tablet  Commonly known as:  Senokot-S  Take 1 tablet by mouth 2 (two) times daily.     sertraline 25 MG tablet  Commonly known as:  ZOLOFT  Take 75 mg by mouth daily.     traMADol 50 MG tablet  Commonly known as:  ULTRAM  Take 50 mg by mouth 2 (two) times daily.     vitamin B-12 1000 MCG tablet  Commonly known as:  CYANOCOBALAMIN  Take 2,000 mcg by mouth daily.       Allergies  Allergen Reactions  . Ciprofloxacin Rash       Follow-up Information    Follow up with Jani Gravel, MD. Schedule an appointment as soon as possible for a visit in 2 weeks.   Specialty:  Internal Medicine   Contact information:   [address] [place] [address] [phone]        The results of significant diagnostics from this hospitalization (including imaging, microbiology,  ancillary and laboratory) are listed below for reference.    Significant Diagnostic Studies: Dg Chest Port 1 View  08/22/2014   CLINICAL DATA:  Chest pain  EXAM: PORTABLE CHEST - 1 VIEW  COMPARISON:  12/26/2013  FINDINGS: Borderline cardiomegaly, stable from prior. Unchanged aortic and hilar contours. Shallow inspiration with interstitial crowding.  There is no edema, consolidation, effusion, or pneumothorax. No osseous findings to explain chest pain.  IMPRESSION: No active disease.   Electronically Signed   By: Jorje Guild M.D.   On: 08/22/2014 03:46    Microbiology: Recent Results (from the past 240 hour(s))  MRSA PCR Screening     Status: None   Collection Time: 08/22/14  7:26 AM  Result Value Ref Range Status   MRSA by PCR NEGATIVE NEGATIVE Final    Comment:        The GeneXpert MRSA Assay (FDA approved for NASAL specimens only), is one component of a comprehensive MRSA colonization surveillance program. It is not intended to diagnose MRSA infection nor to guide or monitor treatment for MRSA infections.      Labs: Basic Metabolic Panel:  Recent Labs Lab 08/22/14 0211  NA 141  K 3.9  CL 104  CO2 25  GLUCOSE 109*  BUN 19  CREATININE 1.20  CALCIUM 9.0   Liver Function Tests:  Recent Labs Lab 08/22/14 0211  AST 17  ALT 10  ALKPHOS 78  BILITOT 0.3  PROT 7.0  ALBUMIN 3.5   No results for input(s): LIPASE, AMYLASE in the last 168 hours. No results for input(s): AMMONIA in the last 168 hours. CBC:  Recent Labs Lab 08/22/14 0211  WBC 6.6  NEUTROABS 3.2  HGB 13.1  HCT 38.9*  MCV 91.7  PLT 239   Cardiac Enzymes:  Recent Labs Lab 08/22/14 0211 08/22/14 0415 08/22/14 0721  TROPONINI <0.30 <0.30 <0.30   BNP: BNP (last 3 results) No results for input(s): PROBNP in the last 8760 hours. CBG: No results for input(s): GLUCAP in the last 168 hours.     SignedLelon Frohlich  [facility] Pager: [phone] 08/22/2014, 1:06  PM

## 2014-08-22 NOTE — H&P (Signed)
Hospitalist Admission History and Physical  Patient name: Mark [name] Medical record number: [id number] Date of birth: 1945-10-10 Age: 68 y.o. Gender: male  Primary Care Provider: Jani Gravel, MD  Chief Complaint: Chest pain  History of Present Illness:This is a 68 y.o. year old male with significant past medical history of CAD s/p MI, parkinsons, CVA, HTN, paroxysmal afib  presenting with chest pain. Pt resident of local SNF. Patient reports episode of severe chest pain that woke him up from sleep earlier today. Patient states that he felt like he was dying. Pain was greater than tablet 10 in terms of intensity. Central in nature with radiation down left arm. No associated nausea or diaphoresis. EMS subsequently called. Patient was given nitroglycerin as well as full dose aspirin with minimal to mild improvement in symptoms. Noted to have been admitted October 2012 for an STEMI. Had catheterization reported to show severe 3 vessel disease with recommendation for aggressive medical management as patient was not felt to be a surgical candidate. Presented to ER temperature 90.8, heart rate in the 50s to 70s, respirations in the tens, blood pressure in the 110s to 130s, satting greater than 95% on room air. White blood cell count 6.6, hemoglobin 13.1, creatinine 1.2. Troponin negative 2. EKG normal sinus rhythm. Chest x-ray within normal limits. Was given morphine in the ER with moderate amount of improvement and chest pain. Chest pain still present. Also some pain in the right arm. Assessment and Plan: Mark [name] is a 68 y.o. year old male presenting with chest pain   Active Problems:   Chest pain   1- Chest Pain  -Fairly typical symptoms though somewhat mixed in patient with known severe three-vessel disease -Troponin negative 2 -EKG normal sinus rhythm -Full dose aspirin -Nitro paste -Continue home regimen -Positive anterior chest wall tenderness to palpation on exam,  reproducible -Cards consult -Follow  2-HTN/paroxysmal afib -BP stable -EKG normal sinus rhythm -Continue home regimen -Follow up cardiology recommendations  3-Parkinson's/CVA -Stable  -no focal deficits noted  -continue Sinemet  4-seizure disorder -Stable -No reported seizure activity -Continue home regimen FEN/GI: heart healthy diet  Prophylaxis: sub q heparin  Disposition: pending further evaluation  Code Status:Full Code    Patient Active Problem List   Diagnosis Date Noted  . Chest pain 08/22/2014  . Colon cancer screening 01/03/2014  . Elevated LFTs 10/05/2013  . Liver lesion 06/23/2012  . Pyelonephritis, acute 06/23/2012  . Hypotension 06/21/2012  . Bacteremia due to Escherichia coli 06/21/2012  . Sepsis 06/20/2012  . FTT (failure to thrive) in adult 06/19/2012  . ARF (acute renal failure) 06/19/2012  . UTI (lower urinary tract infection) 06/19/2012  . Dehydration 06/19/2012  . Frequent falls 06/19/2012  . Hypokalemia 06/19/2012  . Anemia 06/19/2012  . Seizure disorder 06/19/2012  . Fracture of right olecranon process 10/23/2011  . Olecranon fracture, recurrent 10/19/2011  . Fall 08/13/2011  . CAD (coronary artery disease) 07/14/2011  . HTN (hypertension) 07/14/2011  . Parkinson disease 07/14/2011   Past Medical History: Past Medical History  Diagnosis Date  . Hypertension   . PAF (paroxysmal atrial fibrillation)   . Parkinson's disease   . Gout   . Coronary artery disease     SEVERE 3-VESSEL DX WITH NORMAL EF  . Fall   . Fracture of right olecranon process 10/23/2011  . Arthritis   . Stroke 10/12    Hx of remote stroke, RT SIDED WEAKNESS  . Non-ST elevated myocardial infarction (non-STEMI)   .  MI (myocardial infarction) 08/2011    /H&P (05/12/2012)  . Sleep apnea   . Seizures 12/10/2011    HAD SEIZURE  . Skin cancer     left shoulder  . Bacteremia due to Escherichia coli 06/21/2012  . Pyelonephritis, acute 06/23/2012  . Parotid mass 05/2012     ? Adenoma.  . Kidney stones   . Cervical vertebral fracture     Past Surgical History: Past Surgical History  Procedure Laterality Date  . Transthoracic echocardiogram  06/26/2011    EF 55-60%  . Orif elbow fracture  12/29/2011    Procedure: OPEN REDUCTION INTERNAL FIXATION (ORIF) ELBOW/OLECRANON FRACTURE;  Surgeon: Johnny Bridge, MD;  Location: [facility];  Service: Orthopedics;  Laterality: Right;  . Hardware removal  12/29/2011    Procedure: HARDWARE REMOVAL;  Surgeon: Johnny Bridge, MD;  Location: [facility];  Service: Orthopedics;  Laterality: Right;  . Parotidectomy  05/12/2012    right  . Cardiac catheterization  06/26/2011    NORMAL LV SYSTOLIC FUNCTION. RCA IS LARGE AND DOMINANT  . Coronary angioplasty with stent placement  ~ 2000    "one that I know of"  . Skin cancer excision  2013    left shoulder  . Parotidectomy  05/12/2012    Procedure: PAROTIDECTOMY;  Surgeon: Rozetta Nunnery, MD;  Location: [facility] OR;  Service: ENT;  Laterality: Right;  PAROTIDECTOMY MASS    Social History: History   Social History  . Marital Status: Married    Spouse Name: N/A    Number of Children: N/A  . Years of Education: N/A   Social History Main Topics  . Smoking status: Never Smoker   . Smokeless tobacco: Never Used  . Alcohol Use: No  . Drug Use: No  . Sexual Activity: Yes   Other Topics Concern  . None   Social History Narrative    Family History: Family History  Problem Relation Age of Onset  . Heart disease    . Arthritis    . Colon cancer Neg Hx   . Liver disease Neg Hx     Allergies: Allergies  Allergen Reactions  . Ciprofloxacin Rash    Current Facility-Administered Medications  Medication Dose Route Frequency Provider Last Rate Last Dose  . 0.9 %  sodium chloride infusion   Intravenous Continuous Shanda Howells, MD      . aspirin EC tablet 325 mg  325 mg Oral Daily Shanda Howells, MD      . morphine 2 MG/ML injection 1-2 mg  1-2 mg Intravenous Q4H PRN Shanda Howells, MD      . nitroGLYCERIN (NITRODUR - Dosed in mg/24 hr) patch 0.4 mg  0.4 mg Transdermal Daily Shanda Howells, MD      . sodium chloride 0.9 % injection 3 mL  3 mL Intravenous Q12H Shanda Howells, MD       Review Of Systems: 12 point ROS negative except as noted above in HPI.  Physical Exam: Filed Vitals:   08/22/14 0600  BP: 118/70  Pulse: 59  Temp:   Resp: 10    General: cooperative and slowed mentation in setting of parkinsons  HEENT: PERRLA and extra ocular movement intact Heart: S1, S2 normal, no murmur, rub or gallop, regular rate and rhythm Lungs: clear to auscultation, no wheezes or rales and unlabored breathing Abdomen: abdomen is soft without significant tenderness, masses, organomegaly or guarding Extremities: extremities normal, atraumatic, no cyanosis or edema Skin:no rashes Neurology: baseline parkinsons-minimal/delayed ROM at baseline  Labs and Imaging: Lab Results  Component Value Date/Time   NA 141 08/22/2014 02:11 AM   K 3.9 08/22/2014 02:11 AM   CL 104 08/22/2014 02:11 AM   CO2 25 08/22/2014 02:11 AM   BUN 19 08/22/2014 02:11 AM   BUN 19 08/15/2013   CREATININE 1.20 08/22/2014 02:11 AM   CREATININE 1.28 08/15/2013   GLUCOSE 109* 08/22/2014 02:11 AM   Lab Results  Component Value Date   WBC 6.6 08/22/2014   HGB 13.1 08/22/2014   HCT 38.9* 08/22/2014   MCV 91.7 08/22/2014   PLT 239 08/22/2014    Dg Chest Port 1 View  08/22/2014   CLINICAL DATA:  Chest pain  EXAM: PORTABLE CHEST - 1 VIEW  COMPARISON:  12/26/2013  FINDINGS: Borderline cardiomegaly, stable from prior. Unchanged aortic and hilar contours. Shallow inspiration with interstitial crowding. There is no edema, consolidation, effusion, or pneumothorax. No osseous findings to explain chest pain.  IMPRESSION: No active disease.   Electronically Signed   By: Jorje Guild M.D.   On: 08/22/2014 03:46           Shanda Howells MD  Pager: [phone]

## 2014-08-22 NOTE — Progress Notes (Signed)
Patient's IV removed and was clean, dry, and intact at removal.  Report called to [place] at [facility] and she verbalized understanding.  Patient was in stable condition at discharge and had no questions or concerns.  Patient escorted to [facility] by EMS.

## 2014-08-22 NOTE — Clinical Social Work Psychosocial (Signed)
Clinical Social Work Department BRIEF PSYCHOSOCIAL ASSESSMENT 08/22/2014  Patient:  Mark [name], Mark [name]     Account Number:  [id number]     Admit date:  08/22/2014  Clinical Social Worker:  Wyatt Haste  Date/Time:  08/22/2014 02:43 PM  Referred by:  CSW  Date Referred:  08/22/2014 Referred for  SNF Placement   Other Referral:   Interview type:  Patient Other interview type:   wife- Sarah    PSYCHOSOCIAL DATA Living Status:  FACILITY Admitted from facility:  [facility] Level of care:  [place] Primary support name:  [name] Primary support relationship to patient:  SPOUSE Degree of support available:   supportive    CURRENT CONCERNS Current Concerns  Post-Acute Placement   Other Concerns:    SOCIAL WORK ASSESSMENT / PLAN CSW met with pt and pt's wife this morning at bedside. Pt alert and oriented and reports he has been a resident at [facility] for about 3 years now. Pt's wife, [name] is very involved and supportive and visits pt daily. Pt was diagnosed with Parkinson's about 5-6 years ago and his wife was unable to continue to provide care at home for him. He is wheelchair bound, but today required total assist for bed mobility. Around midnight last night, pt began experiencing chest pain. He states he was upset with staff regarding the timing of his medications earlier in the evening. Pt has discussed concerns with DON there. Pt and wife reported that they thought pt was to d/c today. Discussed with MD and pt will d/c back to [facility]. Pt notified. CSW left voicemail for pt's wife. Per Jackelyn Poling at [facility], pt is nursing level of care and they will attempt to get [facility] authorization. Clinicals faxed. Pt is okay to return and they will work on from facility. No FL2 necessary due to <24 hr observation. Transport via [employer].   Assessment/plan status:  No Further Intervention Required Other assessment/ plan:   Information/referral to community resources:    [facility]    PATIENT'S/FAMILY'S RESPONSE TO PLAN OF CARE: Pt reports no further needs at this time. D/C today back to [facility].       Benay Pike, [place]

## 2014-08-22 NOTE — Consult Note (Signed)
Reason for Consult:Chest pain Referring Physician: PTH  AHMAUD DUTHIE is an 68 y.o. male.  HPI: This is a 68 yr old patient of Dr. Purvis Sheffield with history of CAD S/P NSTEMI in 2012 with significant 3 vessel CAD, normal LV function, treated medically. He has significant parkinsons disease and resides in a nursing home.  He was admitted with several episodes of chest pain. He awakened at midnight with chest pressure down left arm, then right arm, relieved with NTG. No dyspnea, diaphoresis, nausea, vomiting.Troponins are negative. EKG normal.Patient has not had any chest pain prior to this episode. He is wheelchair dependent and isn't able to move around at all. His meds were given all at once yesterday and patient feels like this was the problem. Patient also has HTN, PAF, CVA 2012, seizure disorder.  Past Medical History  Diagnosis Date  . Hypertension   . PAF (paroxysmal atrial fibrillation)   . Parkinson's disease   . Gout   . Coronary artery disease     SEVERE 3-VESSEL DX WITH NORMAL EF  . Fall   . Fracture of right olecranon process 10/23/2011  . Arthritis   . Stroke 10/12    Hx of remote stroke, RT SIDED WEAKNESS  . Non-ST elevated myocardial infarction (non-STEMI)   . MI (myocardial infarction) 08/2011    /H&P (05/12/2012)  . Sleep apnea   . Seizures 12/10/2011    HAD SEIZURE  . Skin cancer     left shoulder  . Bacteremia due to Escherichia coli 06/21/2012  . Pyelonephritis, acute 06/23/2012  . 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  . Kidney stones   . Cervical vertebral fracture     Past Surgical History  Procedure Laterality Date  . Transthoracic echocardiogram  06/26/2011    EF 55-60%  . Orif elbow fracture  12/29/2011    Procedure: OPEN REDUCTION INTERNAL FIXATION (ORIF) ELBOW/OLECRANON FRACTURE;  Surgeon: Eulas Post, MD;  Location: [facility] OR;  Service: Orthopedics;  Laterality: Right;  . Hardware removal  12/29/2011    Procedure: HARDWARE REMOVAL;  Surgeon: Eulas Post, MD;  Location: [facility] OR;  Service: Orthopedics;  Laterality: Right;  . Parotidectomy  05/12/2012    right  . Cardiac catheterization  06/26/2011    NORMAL LV SYSTOLIC FUNCTION. RCA IS LARGE AND DOMINANT  . Coronary angioplasty with stent placement  ~ 2000    "one that I know of"  . Skin cancer excision  2013    left shoulder  . Parotidectomy  05/12/2012    Procedure: PAROTIDECTOMY;  Surgeon: Drema Halon, MD;  Location: [facility] OR;  Service: ENT;  Laterality: Right;  PAROTIDECTOMY MASS    Family History  Problem Relation Age of Onset  . Heart disease    . Arthritis    . Colon cancer Neg Hx   . Liver disease Neg Hx     Social History:  reports that he has never smoked. He has never used smokeless tobacco. He reports that he does not drink alcohol or use illicit drugs.  Allergies:  Allergies  Allergen Reactions  . Ciprofloxacin Rash    Medications: Scheduled Meds: . amLODipine  5 mg Oral Daily  . aspirin EC  325 mg Oral Daily  . carbidopa-levodopa  1 tablet Oral TID  . carvedilol  3.125 mg Oral BID WC  . cloNIDine  0.1 mg Transdermal Weekly  . clopidogrel  75 mg Oral Daily  . colchicine  0.6 mg Oral Daily  .  docusate sodium  100 mg Oral BID  . furosemide  20 mg Oral Daily  . levETIRAcetam  250 mg Oral BID  . LORazepam  1 mg Oral Daily  . nitroGLYCERIN  0.4 mg Transdermal Daily  . polyethylene glycol  17 g Oral Daily  . rosuvastatin  20 mg Oral Daily  . sertraline  75 mg Oral Daily  . sodium chloride  3 mL Intravenous Q12H   Continuous Infusions: . sodium chloride     PRN Meds:.ipratropium-albuterol, morphine injection   Results for orders placed or performed during the hospital encounter of 08/22/14 (from the past 48 hour(s))  Comprehensive metabolic panel     Status: Abnormal   Collection Time: 08/22/14  2:11 AM  Result Value Ref Range   Sodium 141 137 - 147 mEq/L   Potassium 3.9 3.7 - 5.3 mEq/L   Chloride 104 96 - 112 mEq/L   CO2 25 19 - 32 mEq/L    Glucose, Bld 109 (H) 70 - 99 mg/dL   BUN 19 6 - 23 mg/dL   Creatinine, Ser 1.20 0.50 - 1.35 mg/dL   Calcium 9.0 8.4 - 10.5 mg/dL   Total Protein 7.0 6.0 - 8.3 g/dL   Albumin 3.5 3.5 - 5.2 g/dL   AST 17 0 - 37 U/L   ALT 10 0 - 53 U/L   Alkaline Phosphatase 78 39 - 117 U/L   Total Bilirubin 0.3 0.3 - 1.2 mg/dL   GFR calc non Af Amer 60 (L) >90 mL/min   GFR calc Af Amer 70 (L) >90 mL/min    Comment: (NOTE) The eGFR has been calculated using the CKD EPI equation. This calculation has not been validated in all clinical situations. eGFR's persistently <90 mL/min signify possible Chronic Kidney Disease.    Anion gap 12 5 - 15  CBC with Differential     Status: Abnormal   Collection Time: 08/22/14  2:11 AM  Result Value Ref Range   WBC 6.6 4.0 - 10.5 K/uL   RBC 4.24 4.22 - 5.81 MIL/uL   Hemoglobin 13.1 13.0 - 17.0 g/dL   HCT 38.9 (L) 39.0 - 52.0 %   MCV 91.7 78.0 - 100.0 fL   MCH 30.9 26.0 - 34.0 pg   MCHC 33.7 30.0 - 36.0 g/dL   RDW 13.1 11.5 - 15.5 %   Platelets 239 150 - 400 K/uL   Neutrophils Relative % 48 43 - 77 %   Neutro Abs 3.2 1.7 - 7.7 K/uL   Lymphocytes Relative 36 12 - 46 %   Lymphs Abs 2.4 0.7 - 4.0 K/uL   Monocytes Relative 9 3 - 12 %   Monocytes Absolute 0.6 0.1 - 1.0 K/uL   Eosinophils Relative 6 (H) 0 - 5 %   Eosinophils Absolute 0.4 0.0 - 0.7 K/uL   Basophils Relative 1 0 - 1 %   Basophils Absolute 0.0 0.0 - 0.1 K/uL  Troponin I     Status: None   Collection Time: 08/22/14  2:11 AM  Result Value Ref Range   Troponin I <0.30 <0.30 ng/mL    Comment:        Due to the release kinetics of cTnI, a negative result within the first hours of the onset of symptoms does not rule out myocardial infarction with certainty. If myocardial infarction is still suspected, repeat the test at appropriate intervals.   D-dimer, quantitative     Status: None   Collection Time: 08/22/14  2:11 AM  Result  Value Ref Range   D-Dimer, Quant 0.33 0.00 - 0.48 ug/mL-FEU     Comment:        AT THE INHOUSE ESTABLISHED CUTOFF VALUE OF 0.48 ug/mL FEU, THIS ASSAY HAS BEEN DOCUMENTED IN THE LITERATURE TO HAVE A SENSITIVITY AND NEGATIVE PREDICTIVE VALUE OF AT LEAST 98 TO 99%.  THE TEST RESULT SHOULD BE CORRELATED WITH AN ASSESSMENT OF THE CLINICAL PROBABILITY OF DVT / VTE.   Troponin I     Status: None   Collection Time: 08/22/14  4:15 AM  Result Value Ref Range   Troponin I <0.30 <0.30 ng/mL    Comment:        Due to the release kinetics of cTnI, a negative result within the first hours of the onset of symptoms does not rule out myocardial infarction with certainty. If myocardial infarction is still suspected, repeat the test at appropriate intervals.   Troponin I     Status: None   Collection Time: 08/22/14  7:21 AM  Result Value Ref Range   Troponin I <0.30 <0.30 ng/mL    Comment:        Due to the release kinetics of cTnI, a negative result within the first hours of the onset of symptoms does not rule out myocardial infarction with certainty. If myocardial infarction is still suspected, repeat the test at appropriate intervals.   Lipid panel     Status: Abnormal   Collection Time: 08/22/14  7:21 AM  Result Value Ref Range   Cholesterol 92 0 - 200 mg/dL   Triglycerides 121 <150 mg/dL   HDL 28 (L) >39 mg/dL   Total CHOL/HDL Ratio 3.3 RATIO   VLDL 24 0 - 40 mg/dL   LDL Cholesterol 40 0 - 99 mg/dL    Comment:        Total Cholesterol/HDL:CHD Risk Coronary Heart Disease Risk Table                     Men   Women  1/2 Average Risk   3.4   3.3  Average Risk       5.0   4.4  2 X Average Risk   9.6   7.1  3 X Average Risk  23.4   11.0        Use the calculated Patient Ratio above and the CHD Risk Table to determine the patient's CHD Risk.        ATP III CLASSIFICATION (LDL):  <100     mg/dL   Optimal  100-129  mg/dL   Near or Above                    Optimal  130-159  mg/dL   Borderline  160-189  mg/dL   High  >190     mg/dL   Very  High   MRSA PCR Screening     Status: None   Collection Time: 08/22/14  7:26 AM  Result Value Ref Range   MRSA by PCR NEGATIVE NEGATIVE    Comment:        The GeneXpert MRSA Assay (FDA approved for NASAL specimens only), is one component of a comprehensive MRSA colonization surveillance program. It is not intended to diagnose MRSA infection nor to guide or monitor treatment for MRSA infections.     Dg Chest Port 1 View  08/22/2014   CLINICAL DATA:  Chest pain  EXAM: PORTABLE CHEST - 1 VIEW  COMPARISON:  12/26/2013  FINDINGS: Borderline  cardiomegaly, stable from prior. Unchanged aortic and hilar contours. Shallow inspiration with interstitial crowding. There is no edema, consolidation, effusion, or pneumothorax. No osseous findings to explain chest pain.  IMPRESSION: No active disease.   Electronically Signed   By: Jorje Guild M.D.   On: 08/22/2014 03:46    ROS  See HPI Eyes: Negative Ears:Negative for hearing loss, tinnitus Cardiovascular: Negative for  palpitations,irregular heartbeat, dyspnea, dyspnea on exertion, near-syncope, orthopnea, paroxysmal nocturnal dyspnea and syncope,edema, claudication, cyanosis,.  Respiratory:   Negative for cough, hemoptysis, shortness of breath, sleep disturbances due to breathing, sputum production and wheezing.   Endocrine: Negative for cold intolerance and heat intolerance.  Hematologic/Lymphatic: Negative for adenopathy and bleeding problem. Does not bruise/bleed easily.  Musculoskeletal: Parikisons, wheelcair dependent   Gastrointestinal: Negative for nausea, vomiting, reflux, abdominal pain, diarrhea, constipation.   Genitourinary: Negative for bladder incontinence, dysuria, flank pain, frequency, hematuria, hesitancy, nocturia and urgency.  Neurological: seizure disorder. Allergic/Immunologic: Negative for environmental allergies.  Blood pressure 131/74, pulse 59, temperature 97.7 F (36.5 C), temperature source Oral, resp. rate 10,  height $Remov'6\' 1"'TqeVMF$  (1.854 m), weight 251 lb 1.7 oz (113.9 kg), SpO2 91 %. Physical Exam  PHYSICAL EXAM: Obese, in no acute distress. Neck: No JVD, HJR, Bruit, or thyroid enlargement Lungs: No tachypnea, clear without wheezing, rales, or rhonchi Cardiovascular: RRR, PMI not displaced, heart sounds normal, no murmurs, gallops, bruit, thrill, or heave. Abdomen: BS normal. Soft without organomegaly, masses, lesions or tenderness. Extremities: trace of ankle edema, otherwise lower extremities without cyanosis, clubbing. Good distal pulses bilateral SKin: Warm, no lesions or rashes  Musculoskeletal: No deformities Neuro: no focal signs  EKG: NSR without acute change.  Echo from 06/26/11 showed mild LVH, normal LV systolic function, EF 09-38%, LA mildly dilated.  Angiography: Left main:  The left main is normal.   The left anterior descending artery is normal in the proximal segment. It gives off 2 diagonal branches that come off almost at the same site. It is then occluded following these diagonal branches.  The distal LAD fills via left-to-left and right-to-left collaterals.   The first diagonal artery has a 50% stenosis at the takeoff.  The 2nd diagonal artery has minor luminal irregularities.   The left circumflex artery is a large vessel.  It gives off a very high obtuse marginal artery, which is fairly normal.  The 2nd obtuse marginal artery is smaller and has minor luminal irregularities.  The continuation branch of the left circumflex artery is subtotally occluded and then reconstitutes.  It is then abruptly occluded consistent with a thrombus.  I suspect that this is our culprit lesion.  I suspect that it occluded his left circumflex artery and then embolized thrombus further distally.   The right coronary artery is large and dominant.  There is severe disease in the proximal region.  There is an eccentric 80% stenosis. Following this, there is a 50-60% stenosis in the mid vessel.   The distal vessel has only minor luminal irregularities.  The posterior descending artery and posterolateral segment artery have minor luminal irregularities.   The left ventriculogram was performed in the 30 RAO position, reveals normal left ventricular systolic function.  There are no segmental wall motion abnormalities.   COMPLICATIONS:  None.   CONCLUSIONS: 1. Severe three-vessel coronary artery disease. 2. Well-preserved left ventricular systolic function. 3. The patient is completely pain free and has not had any symptoms.     I think our best course would be for aggressive medical  therapy.  I     do not think that he is a good surgical candidate. 4. We will measure his cholesterol levels  in the morning and be very     aggressive with cholesterol control.  We will continue with his     current medications.  We will try to get in touch with Dr. Criss Rosales.     I have already made several attempts today, but her line is busy.     Assessment/Plan: Chest pain consistent with angina, but negative troponins and normal EKG. Known severe 3VCAD on cath 2012. Has been doing well treated medically until now. Will add Imdur.Consider 2Decho for LV function, but would continue medical management.  CAD: Severe 3V disease on cath 2012 with normal LV function. See cath report above.  Severe Parkinson's disease in nursing home. Wheelchair dependent  HTN: BP on high side. Won't increase Coreg with bradycardia. Add Imdur.  Ermalinda Barrios 08/22/2014, 9:46 AM

## 2014-08-23 LAB — TSH: TSH: 5.37 u[IU]/mL — ABNORMAL HIGH (ref 0.350–4.500)

## 2014-12-19 NOTE — Telephone Encounter (Signed)
Mark [name], I never received any input regarding this (from 07/2014). Do we know if he had the test?

## 2014-12-19 NOTE — Telephone Encounter (Signed)
This was never sent to me in 07/2014.   I called Antony Haste (cologu[employer] rep) he is coming tomorrow and he will bring the information so we will be able to get into the portal and track our results for our patients. He said this will be easier for [place] to get results.

## 2014-12-19 NOTE — Telephone Encounter (Signed)
Thanks

## 2015-01-21 NOTE — Telephone Encounter (Signed)
Did we ever get set up with portal to know if this patient had [employer]?

## 2015-01-22 NOTE — Telephone Encounter (Signed)
No, I have not been able to get in touch with the guy at [employer] to set this up. Tried to call again to day and had to leave him a message.

## 2015-01-24 NOTE — Telephone Encounter (Signed)
Finally able to get into the provider portal with [employer]. Per provider portal- order was incomplete on this pt and they are contacting pt.

## 2015-02-15 NOTE — Telephone Encounter (Signed)
Does that mean the patient never submitted the specimen?

## 2015-02-28 NOTE — Telephone Encounter (Signed)
I have emailed Dirk Dress at [employer], he emailed me back and said that " incomplete order, patient being contacted" means they needed some additional information and the kit has not been sent out yet to the pt. He gave me the customer service [phone] and asked me to call them for specifics. I called and was informed that they had tried to call the pts home number 3x and left messages and never got a call back. She said they need an address to send the kit and the pts insurance information. I informed her that he was in a facility. She asked that I contact the facility and have them call her with the information.I called [facility] and got a good fax number and spoke with a nurse and informed her what I was sending them. A letter with all the information has been faxed to [facility]. See pt letters.

## 2015-02-28 NOTE — Telephone Encounter (Signed)
Thanks for all the effort! We have to be advocates for nursing home patients.

## 2015-03-12 ENCOUNTER — Emergency Department (HOSPITAL_COMMUNITY): Payer: Commercial Managed Care - HMO

## 2015-03-12 ENCOUNTER — Encounter (HOSPITAL_COMMUNITY): Payer: Self-pay | Admitting: *Deleted

## 2015-03-12 ENCOUNTER — Emergency Department (HOSPITAL_COMMUNITY)
Admission: EM | Admit: 2015-03-12 | Discharge: 2015-03-12 | Disposition: A | Discharge status: Home / Self Care | Payer: Commercial Managed Care - HMO | Attending: Emergency Medicine | Admitting: Emergency Medicine

## 2015-03-12 DIAGNOSIS — I2510 Atherosclerotic heart disease of native coronary artery without angina pectoris: Secondary | ICD-10-CM | POA: Diagnosis not present

## 2015-03-12 DIAGNOSIS — Z79899 Other long term (current) drug therapy: Secondary | ICD-10-CM | POA: Insufficient documentation

## 2015-03-12 DIAGNOSIS — Z8619 Personal history of other infectious and parasitic diseases: Secondary | ICD-10-CM | POA: Diagnosis not present

## 2015-03-12 DIAGNOSIS — W228XXA Striking against or struck by other objects, initial encounter: Secondary | ICD-10-CM | POA: Insufficient documentation

## 2015-03-12 DIAGNOSIS — Z85828 Personal history of other malignant neoplasm of skin: Secondary | ICD-10-CM | POA: Insufficient documentation

## 2015-03-12 DIAGNOSIS — I10 Essential (primary) hypertension: Secondary | ICD-10-CM | POA: Insufficient documentation

## 2015-03-12 DIAGNOSIS — G20 Parkinson's disease: Secondary | ICD-10-CM | POA: Insufficient documentation

## 2015-03-12 DIAGNOSIS — Z7902 Long term (current) use of antithrombotics/antiplatelets: Secondary | ICD-10-CM | POA: Insufficient documentation

## 2015-03-12 DIAGNOSIS — I480 Paroxysmal atrial fibrillation: Secondary | ICD-10-CM | POA: Insufficient documentation

## 2015-03-12 DIAGNOSIS — Z8781 Personal history of (healed) traumatic fracture: Secondary | ICD-10-CM | POA: Insufficient documentation

## 2015-03-12 DIAGNOSIS — Z86018 Personal history of other benign neoplasm: Secondary | ICD-10-CM | POA: Diagnosis not present

## 2015-03-12 DIAGNOSIS — Z9861 Coronary angioplasty status: Secondary | ICD-10-CM | POA: Insufficient documentation

## 2015-03-12 DIAGNOSIS — E669 Obesity, unspecified: Secondary | ICD-10-CM | POA: Insufficient documentation

## 2015-03-12 DIAGNOSIS — Y9389 Activity, other specified: Secondary | ICD-10-CM | POA: Insufficient documentation

## 2015-03-12 DIAGNOSIS — Z8673 Personal history of transient ischemic attack (TIA), and cerebral infarction without residual deficits: Secondary | ICD-10-CM | POA: Insufficient documentation

## 2015-03-12 DIAGNOSIS — Z9889 Other specified postprocedural states: Secondary | ICD-10-CM | POA: Insufficient documentation

## 2015-03-12 DIAGNOSIS — Z87448 Personal history of other diseases of urinary system: Secondary | ICD-10-CM | POA: Diagnosis not present

## 2015-03-12 DIAGNOSIS — S299XXA Unspecified injury of thorax, initial encounter: Secondary | ICD-10-CM | POA: Insufficient documentation

## 2015-03-12 DIAGNOSIS — Z87442 Personal history of urinary calculi: Secondary | ICD-10-CM | POA: Diagnosis not present

## 2015-03-12 DIAGNOSIS — Y998 Other external cause status: Secondary | ICD-10-CM | POA: Diagnosis not present

## 2015-03-12 DIAGNOSIS — Z87440 Personal history of urinary (tract) infections: Secondary | ICD-10-CM | POA: Diagnosis not present

## 2015-03-12 DIAGNOSIS — Z9181 History of falling: Secondary | ICD-10-CM | POA: Insufficient documentation

## 2015-03-12 DIAGNOSIS — Z792 Long term (current) use of antibiotics: Secondary | ICD-10-CM | POA: Diagnosis not present

## 2015-03-12 DIAGNOSIS — G40909 Epilepsy, unspecified, not intractable, without status epilepticus: Secondary | ICD-10-CM | POA: Insufficient documentation

## 2015-03-12 DIAGNOSIS — M1990 Unspecified osteoarthritis, unspecified site: Secondary | ICD-10-CM | POA: Diagnosis not present

## 2015-03-12 DIAGNOSIS — Y92128 Other place in nursing home as the place of occurrence of the external cause: Secondary | ICD-10-CM | POA: Insufficient documentation

## 2015-03-12 DIAGNOSIS — I252 Old myocardial infarction: Secondary | ICD-10-CM | POA: Diagnosis not present

## 2015-03-12 DIAGNOSIS — R0789 Other chest pain: Secondary | ICD-10-CM

## 2015-03-12 DIAGNOSIS — S4991XA Unspecified injury of right shoulder and upper arm, initial encounter: Secondary | ICD-10-CM | POA: Insufficient documentation

## 2015-03-12 DIAGNOSIS — M109 Gout, unspecified: Secondary | ICD-10-CM | POA: Insufficient documentation

## 2015-03-12 LAB — BASIC METABOLIC PANEL
ANION GAP: 8 (ref 5–15)
BUN: 17 mg/dL (ref 6–20)
CO2: 25 mmol/L (ref 22–32)
CREATININE: 1.18 mg/dL (ref 0.61–1.24)
Calcium: 8.7 mg/dL — ABNORMAL LOW (ref 8.9–10.3)
Chloride: 107 mmol/L (ref 101–111)
Glucose, Bld: 110 mg/dL — ABNORMAL HIGH (ref 65–99)
Potassium: 4.1 mmol/L (ref 3.5–5.1)
SODIUM: 140 mmol/L (ref 135–145)

## 2015-03-12 LAB — CBC WITH DIFFERENTIAL/PLATELET
BASOS PCT: 1 % (ref 0–1)
Basophils Absolute: 0 10*3/uL (ref 0.0–0.1)
EOS ABS: 0.5 10*3/uL (ref 0.0–0.7)
EOS PCT: 7 % — AB (ref 0–5)
HCT: 41.6 % (ref 39.0–52.0)
HEMOGLOBIN: 13.9 g/dL (ref 13.0–17.0)
LYMPHS ABS: 1.9 10*3/uL (ref 0.7–4.0)
LYMPHS PCT: 29 % (ref 12–46)
MCH: 31 pg (ref 26.0–34.0)
MCHC: 33.4 g/dL (ref 30.0–36.0)
MCV: 92.7 fL (ref 78.0–100.0)
MONOS PCT: 7 % (ref 3–12)
Monocytes Absolute: 0.5 10*3/uL (ref 0.1–1.0)
NEUTROS PCT: 56 % (ref 43–77)
Neutro Abs: 3.7 10*3/uL (ref 1.7–7.7)
Platelets: 283 10*3/uL (ref 150–400)
RBC: 4.49 MIL/uL (ref 4.22–5.81)
RDW: 13.1 % (ref 11.5–15.5)
WBC: 6.6 10*3/uL (ref 4.0–10.5)

## 2015-03-12 LAB — TROPONIN I
Troponin I: <0.03 ng/mL (ref <0.031)
Troponin I: <0.03 ng/mL (ref <0.031)

## 2015-03-12 MED ORDER — OXYCODONE-ACETAMINOPHEN 5-325 MG PO TABS
1.0000 | ORAL_TABLET | Freq: Once | ORAL | Status: AC
  Administered 2015-03-12: 1 via ORAL
  Filled 2015-03-12: qty 1
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
## 2015-03-12 NOTE — Discharge Instructions (Signed)

## 2015-03-20 ENCOUNTER — Ambulatory Visit: Payer: Medicare HMO | Admitting: Cardiovascular Disease

## 2015-03-20 ENCOUNTER — Encounter: Payer: Self-pay | Admitting: Cardiovascular Disease

## 2015-03-20 ENCOUNTER — Ambulatory Visit (INDEPENDENT_AMBULATORY_CARE_PROVIDER_SITE_OTHER): Payer: Medicare HMO | Admitting: Cardiovascular Disease

## 2015-03-20 VITALS — BP 122/72 | HR 73 | Ht 74.0 in

## 2015-03-20 DIAGNOSIS — I10 Essential (primary) hypertension: Secondary | ICD-10-CM | POA: Diagnosis not present

## 2015-03-20 DIAGNOSIS — E785 Hyperlipidemia, unspecified: Secondary | ICD-10-CM

## 2015-03-20 DIAGNOSIS — R600 Localized edema: Secondary | ICD-10-CM

## 2015-03-20 DIAGNOSIS — I2510 Atherosclerotic heart disease of native coronary artery without angina pectoris: Secondary | ICD-10-CM

## 2015-03-20 MED ORDER — FUROSEMIDE 20 MG PO TABS: 20.0000 mg | ORAL_TABLET | Freq: Every day | ORAL | Status: DC

## 2015-03-20 NOTE — Patient Instructions (Signed)
Your physician wants you to follow-up in: 6 months You will receive a reminder letter in the mail two months in advance. If you don't receive a letter, please call our office to schedule the follow-up appointment.    You may take extra 20 mg lasix daily if needed for increased leg swelling     Thank you for choosing [facility] !

## 2015-03-20 NOTE — Progress Notes (Signed)
Patient ID: Mark [name], male   DOB: 1946-04-07, 69 y.o.   MRN: [id number]      SUBJECTIVE: Mark [name] returns for follow up. He has a history of Parkinson's disease, non-STEMI in 2012 with three-vessel coronary artery disease which has been treated medically, hypertension and hyperlipidemia. His wife, Mark [name], is with him.  Evaluated in the ED on 7/5 for right sided chest wall pain deemed musculoskeletal and not consistent with a cardiac etiology with negative troponins.  He denies any significant left precordial pain. He gets short of breath when he gets anxious as per his wife. He has chronic bilateral leg swelling. He takes Lasix 20 mg daily. He occasionally has nosebleeds.  ECG on 03/12/15 showed normal sinus rhythm with a nonspecific intraventricular conduction delay, QRS duration 116 ms, and a nonspecific T wave abnormality.  Review of Systems: As per "subjective", otherwise negative.  Allergies  Allergen Reactions  . Ciprofloxacin Rash    Current Outpatient Prescriptions  Medication Sig Dispense Refill  . amLODipine (NORVASC) 5 MG tablet Take 5 mg by mouth daily.    . carbidopa-levodopa (SINEMET) 25-100 MG per tablet Take 1 tablet by mouth 3 (three) times daily.     . carvedilol (COREG) 3.125 MG tablet Take 1 tablet (3.125 mg total) by mouth 2 (two) times daily with a meal. 60 tablet 6  . cefTRIAXone (ROCEPHIN) 1 G injection Inject 1 g into the muscle daily. For 7 days(start 03/06/15) takes last dose 03/12/15 at 1700    . Cholecalciferol (VITAMIN D) 2000 UNITS tablet Take 2,000 Units by mouth daily.    . cloNIDine (CATAPRES - DOSED IN MG/24 HR) 0.1 mg/24hr patch Place 0.1 mg onto the skin once a week.    . cloNIDine (CATAPRES) 0.1 MG tablet Take 0.1 mg by mouth every 6 (six) hours as needed (BP greather than 160/90).    . clopidogrel (PLAVIX) 75 MG tablet Take 1 tablet (75 mg total) by mouth daily. 30 tablet 6  . colchicine 0.6 MG tablet Take 0.6 mg by mouth daily.    .  diphenhydrAMINE (SOMINEX) 25 MG tablet Take 25 mg by mouth at bedtime as needed for sleep.    . famotidine (PEPCID) 20 MG tablet Take 20 mg by mouth as needed for heartburn or indigestion.    . furosemide (LASIX) 20 MG tablet Take 20 mg by mouth daily.    Marland Kitchen HYDROcodone-acetaminophen (NORCO) 7.5-325 MG per tablet Take 1 tablet by mouth every 6 (six) hours as needed for moderate pain.    Marland Kitchen ipratropium-albuterol (DUONEB) 0.5-2.5 (3) MG/3ML SOLN Take 3 mLs by nebulization every 6 (six) hours as needed (SHORTNESS OF BREATH).     Marland Kitchen iron polysaccharides (NIFEREX) 150 MG capsule Take 1 capsule (150 mg total) by mouth 2 (two) times daily. 60 capsule 0  . isosorbide mononitrate (IMDUR) 30 MG 24 hr tablet Take 1 tablet (30 mg total) by mouth daily.    Marland Kitchen levETIRAcetam (KEPPRA) 250 MG tablet Take 250 mg by mouth 2 (two) times daily.     Marland Kitchen LORazepam (ATIVAN) 1 MG tablet Take 1 mg by mouth daily.    . nitroGLYCERIN (NITROSTAT) 0.4 MG SL tablet Place 0.4 mg under the tongue every 5 (five) minutes as needed for chest pain.    Marland Kitchen oxymetazoline (AFRIN) 0.05 % nasal spray Place 1 spray into both nostrils as needed for congestion.    . polyethylene glycol (MIRALAX / GLYCOLAX) packet Take 17 g by mouth daily.    Marland Kitchen  Propylene Glycol (SYSTANE BALANCE) 0.6 % SOLN Apply 2 drops to eye at bedtime as needed (DRY EYES).    . rosuvastatin (CRESTOR) 20 MG tablet Take 1 tablet (20 mg total) by mouth daily. 30 tablet 6  . senna-docusate (SENOKOT-S) 8.6-50 MG per tablet Take 1 tablet by mouth 2 (two) times daily.    . sertraline (ZOLOFT) 25 MG tablet Take 25-75 mg by mouth 2 (two) times daily.     . traMADol (ULTRAM) 50 MG tablet Take 50 mg by mouth 3 (three) times daily.     . vitamin B-12 (CYANOCOBALAMIN) 100 MCG tablet Take 100 mcg by mouth daily.     No current facility-administered medications for this visit.    Past Medical History  Diagnosis Date  . Hypertension   . PAF (paroxysmal atrial fibrillation)   . Parkinson's  disease   . Gout   . Coronary artery disease     SEVERE 3-VESSEL DX WITH NORMAL EF  . Fall   . Fracture of right olecranon process 10/23/2011  . Arthritis   . Stroke 10/12    Hx of remote stroke, RT SIDED WEAKNESS  . Non-ST elevated myocardial infarction (non-STEMI)   . MI (myocardial infarction) 08/2011    /H&P (05/12/2012)  . Sleep apnea   . Seizures 12/10/2011    HAD SEIZURE  . Skin cancer     left shoulder  . Bacteremia due to Escherichia coli 06/21/2012  . Pyelonephritis, acute 06/23/2012  . 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  . Kidney stones   . Cervical vertebral fracture     Past Surgical History  Procedure Laterality Date  . Transthoracic echocardiogram  06/26/2011    EF 55-60%  . Orif elbow fracture  12/29/2011    Procedure: OPEN REDUCTION INTERNAL FIXATION (ORIF) ELBOW/OLECRANON FRACTURE;  Surgeon: Johnny Bridge, MD;  Location: [facility];  Service: Orthopedics;  Laterality: Right;  . Hardware removal  12/29/2011    Procedure: HARDWARE REMOVAL;  Surgeon: Johnny Bridge, MD;  Location: [facility];  Service: Orthopedics;  Laterality: Right;  . Parotidectomy  05/12/2012    right  . Cardiac catheterization  06/26/2011    NORMAL LV SYSTOLIC FUNCTION. RCA IS LARGE AND DOMINANT  . Coronary angioplasty with stent placement  ~ 2000    "one that I know of"  . Skin cancer excision  2013    left shoulder  . Parotidectomy  05/12/2012    Procedure: PAROTIDECTOMY;  Surgeon: Rozetta Nunnery, MD;  Location: [facility];  Service: ENT;  Laterality: Right;  PAROTIDECTOMY MASS    History   Social History  . Marital Status: Married    Spouse Name: N/A  . Number of Children: N/A  . Years of Education: N/A   Occupational History  . Not on file.   Social History Main Topics  . Smoking status: Never Smoker   . Smokeless tobacco: Never Used  . Alcohol Use: No  . Drug Use: No  . Sexual Activity: Yes   Other Topics Concern  . Not on file   Social History Narrative     Filed Vitals:     03/20/15 0923  BP: 122/72  Pulse: 73  Height: 6\' 2"  (1.88 m)  SpO2: 94%    PHYSICAL EXAM General: NAD HEENT: Normal. Neck: No JVD, no thyromegaly. Lungs: Clear to auscultation bilaterally with normal respiratory effort. CV: Nondisplaced PMI.  Regular rate and rhythm, normal S1/S2, no S3/S4, no murmur. Trace-1+ pretibial and periankle edema,  wearing compression stockings.  No carotid bruit.  Abdomen: Soft, nontender, obese, no distention.  Neurologic: Alert.  Psych: Somewhat flat affect. Skin: Normal.   ECG: Most recent ECG reviewed.      ASSESSMENT AND PLAN: 1. CAD: Symptomatically stable. Continue carvedilol, Plavix, and Crestor, along with Imdur 30 mg. Aspirin was previously stopped due to nosebleeds but he has tolerated Plavix.  2. Essential HTN: Controlled on present therapy which includes amlodipine 5 mg, carvedilol 3.125 mg twice daily, and clonidine patch. No changes.  3. Hyperlipidemia: Lipid panel on 08/22/14 showed total cholesterol 92, triglycerides 121, HDL 28, LDL 40. Continue Crestor 20 mg.  4. Leg swelling: Can take an extra 20 mg Lasix in addition to his maintenance dose of 20 mg daily for increased swelling. Wearing compression stockings.  Dispo: f/u 6 months with NP.  Time spent: 40 minutes, of which greater than 50% was spent reviewing symptoms, relevant blood tests and studies, and discussing management plan with the patient.   Kate Sable, M.D., F.A.C.C.

## 2015-04-12 ENCOUNTER — Ambulatory Visit: Payer: Medicare HMO | Admitting: Cardiovascular Disease

## 2015-05-16 ENCOUNTER — Other Ambulatory Visit: Payer: Self-pay | Admitting: Neurology

## 2015-05-16 DIAGNOSIS — M4712 Other spondylosis with myelopathy, cervical region: Secondary | ICD-10-CM

## 2015-05-28 ENCOUNTER — Ambulatory Visit (HOSPITAL_COMMUNITY)
Admission: RE | Admit: 2015-05-28 | Discharge: 2015-05-28 | Disposition: A | Discharge status: Home / Self Care | Payer: Commercial Managed Care - HMO | Source: Ambulatory Visit | Attending: Neurology | Admitting: Neurology

## 2015-05-28 DIAGNOSIS — M4712 Other spondylosis with myelopathy, cervical region: Secondary | ICD-10-CM

## 2015-05-29 ENCOUNTER — Other Ambulatory Visit: Payer: Self-pay | Admitting: Neurology

## 2015-05-29 DIAGNOSIS — M4712 Other spondylosis with myelopathy, cervical region: Secondary | ICD-10-CM

## 2015-06-15 ENCOUNTER — Inpatient Hospital Stay: Admission: RE | Admit: 2015-06-15 | Payer: Commercial Managed Care - HMO | Source: Ambulatory Visit

## 2015-07-01 ENCOUNTER — Other Ambulatory Visit: Payer: Commercial Managed Care - HMO

## 2015-07-02 ENCOUNTER — Other Ambulatory Visit: Payer: Self-pay | Admitting: Neurology

## 2015-07-02 ENCOUNTER — Ambulatory Visit
Admission: RE | Admit: 2015-07-02 | Discharge: 2015-07-02 | Disposition: A | Discharge status: Home / Self Care | Payer: Commercial Managed Care - HMO | Source: Ambulatory Visit | Attending: Neurology | Admitting: Neurology

## 2015-07-02 DIAGNOSIS — M4712 Other spondylosis with myelopathy, cervical region: Secondary | ICD-10-CM

## 2015-08-29 ENCOUNTER — Telehealth: Payer: Self-pay | Admitting: Gastroenterology

## 2015-08-29 NOTE — Telephone Encounter (Signed)
Received ColoGuard test result on patient. It was negative. Originally ordered April 2015, completed July 2016.  For ongoing colorectal cancer screening, would recommend repeat ColoGuard in July 2019.

## 2015-09-04 NOTE — Telephone Encounter (Signed)
ON RECALL  °

## 2015-09-04 NOTE — Telephone Encounter (Signed)
Mailed letter.  Please nic cologuard in 03/2018

## 2015-11-13 ENCOUNTER — Encounter: Payer: Self-pay | Admitting: Cardiovascular Disease

## 2015-11-13 ENCOUNTER — Ambulatory Visit (INDEPENDENT_AMBULATORY_CARE_PROVIDER_SITE_OTHER): Payer: Commercial Managed Care - HMO | Admitting: Cardiovascular Disease

## 2015-11-13 VITALS — BP 140/88 | HR 85 | Ht 71.0 in | Wt 272.0 lb

## 2015-11-13 DIAGNOSIS — I25118 Atherosclerotic heart disease of native coronary artery with other forms of angina pectoris: Secondary | ICD-10-CM | POA: Diagnosis not present

## 2015-11-13 DIAGNOSIS — I10 Essential (primary) hypertension: Secondary | ICD-10-CM

## 2015-11-13 DIAGNOSIS — E785 Hyperlipidemia, unspecified: Secondary | ICD-10-CM

## 2015-11-13 DIAGNOSIS — R600 Localized edema: Secondary | ICD-10-CM

## 2015-11-13 NOTE — Progress Notes (Signed)
Patient ID: ELIEZAR HOCKADAY, male   DOB: 07-14-1946, 70 y.o.   MRN: [id number]      SUBJECTIVE: Mr. Segrest returns for follow up. He has a history of Parkinson's disease, non-STEMI in 2012 with three-vessel coronary artery disease which has been treated medically, hypertension and hyperlipidemia. His wife, [name], is with him.  He essentially says "yes" with regards to any questions asked of him about symptoms such as chest pain and shortness of breath. He claims that groundglass powder is being thrown on him every night which makes it difficult for him to breathe. His wife denies any knowledge of this. She says his main problems over the past several days have been seasonal allergies. He takes Lasix 20 mg daily for bilateral ankle swelling and sometimes more as needed.  Review of Systems: As per "subjective", otherwise negative.  Allergies  Allergen Reactions  . Ciprofloxacin Rash    Current Outpatient Prescriptions  Medication Sig Dispense Refill  . amLODipine (NORVASC) 5 MG tablet Take 5 mg by mouth daily.    . carbidopa-levodopa (SINEMET) 25-100 MG per tablet Take 1 tablet by mouth 3 (three) times daily.     . carvedilol (COREG) 3.125 MG tablet Take 1 tablet (3.125 mg total) by mouth 2 (two) times daily with a meal. 60 tablet 6  . cefTRIAXone (ROCEPHIN) 1 G injection Inject 1 g into the muscle daily. For 7 days(start 03/06/15) takes last dose 03/12/15 at 1700    . Cholecalciferol (VITAMIN D) 2000 UNITS tablet Take 2,000 Units by mouth daily.    . cloNIDine (CATAPRES - DOSED IN MG/24 HR) 0.1 mg/24hr patch Place 0.1 mg onto the skin once a week.    . cloNIDine (CATAPRES) 0.1 MG tablet Take 0.1 mg by mouth every 6 (six) hours as needed (BP greather than 160/90).    . clopidogrel (PLAVIX) 75 MG tablet Take 1 tablet (75 mg total) by mouth daily. 30 tablet 6  . colchicine 0.6 MG tablet Take 0.6 mg by mouth daily.    . diphenhydrAMINE (SOMINEX) 25 MG tablet Take 25 mg by mouth at bedtime as  needed for sleep.    Marland Kitchen donepezil (ARICEPT) 10 MG tablet Take 10 mg by mouth at bedtime.    . famotidine (PEPCID) 20 MG tablet Take 20 mg by mouth as needed for heartburn or indigestion.    . furosemide (LASIX) 20 MG tablet Take 1 tablet (20 mg total) by mouth daily. 45 tablet 6  . HYDROcodone-acetaminophen (NORCO) 7.5-325 MG per tablet Take 1 tablet by mouth every 6 (six) hours as needed for moderate pain.    Marland Kitchen ipratropium-albuterol (DUONEB) 0.5-2.5 (3) MG/3ML SOLN Take 3 mLs by nebulization every 6 (six) hours as needed (SHORTNESS OF BREATH).     Marland Kitchen iron polysaccharides (NIFEREX) 150 MG capsule Take 1 capsule (150 mg total) by mouth 2 (two) times daily. 60 capsule 0  . isosorbide mononitrate (IMDUR) 30 MG 24 hr tablet Take 1 tablet (30 mg total) by mouth daily.    Marland Kitchen levETIRAcetam (KEPPRA) 250 MG tablet Take 250 mg by mouth 2 (two) times daily.     Marland Kitchen LORazepam (ATIVAN) 1 MG tablet Take 1 mg by mouth daily.    . nitroGLYCERIN (NITROSTAT) 0.4 MG SL tablet Place 0.4 mg under the tongue every 5 (five) minutes as needed for chest pain.    Marland Kitchen oxymetazoline (AFRIN) 0.05 % nasal spray Place 1 spray into both nostrils as needed for congestion.    . polyethylene glycol (MIRALAX /  GLYCOLAX) packet Take 17 g by mouth daily.    Marland Kitchen Propylene Glycol (SYSTANE BALANCE) 0.6 % SOLN Apply 2 drops to eye at bedtime as needed (DRY EYES).    . rosuvastatin (CRESTOR) 20 MG tablet Take 1 tablet (20 mg total) by mouth daily. 30 tablet 6  . senna-docusate (SENOKOT-S) 8.6-50 MG per tablet Take 1 tablet by mouth 2 (two) times daily.    . sertraline (ZOLOFT) 25 MG tablet Take 25-75 mg by mouth 2 (two) times daily.     . traMADol (ULTRAM) 50 MG tablet Take 50 mg by mouth 3 (three) times daily.     . vitamin B-12 (CYANOCOBALAMIN) 100 MCG tablet Take 100 mcg by mouth daily.     No current facility-administered medications for this visit.    Past Medical History  Diagnosis Date  . Hypertension   . PAF (paroxysmal atrial  fibrillation) ([place])   . Parkinson's disease   . Gout   . Coronary artery disease     SEVERE 3-VESSEL DX WITH NORMAL EF  . Fall   . Fracture of right olecranon process 10/23/2011  . Arthritis   . Stroke ([place]) 10/12    Hx of remote stroke, RT SIDED WEAKNESS  . Non-ST elevated myocardial infarction (non-STEMI) ([place])   . MI (myocardial infarction) ([place]) 08/2011    /H&P (05/12/2012)  . Sleep apnea   . Seizures ([place]) 12/10/2011    HAD SEIZURE  . Skin cancer     left shoulder  . Bacteremia due to Escherichia coli 06/21/2012  . Pyelonephritis, acute 06/23/2012  . 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  . Kidney stones   . Cervical vertebral fracture [facility])     Past Surgical History  Procedure Laterality Date  . Transthoracic echocardiogram  06/26/2011    EF 55-60%  . Orif elbow fracture  12/29/2011    Procedure: OPEN REDUCTION INTERNAL FIXATION (ORIF) ELBOW/OLECRANON FRACTURE;  Surgeon: Johnny Bridge, MD;  Location: [place];  Service: Orthopedics;  Laterality: Right;  . Hardware removal  12/29/2011    Procedure: HARDWARE REMOVAL;  Surgeon: Johnny Bridge, MD;  Location: [place];  Service: Orthopedics;  Laterality: Right;  . Parotidectomy  05/12/2012    right  . Cardiac catheterization  06/26/2011    NORMAL LV SYSTOLIC FUNCTION. RCA IS LARGE AND DOMINANT  . Coronary angioplasty with stent placement  ~ 2000    "one that I know of"  . Skin cancer excision  2013    left shoulder  . Parotidectomy  05/12/2012    Procedure: PAROTIDECTOMY;  Surgeon: Rozetta Nunnery, MD;  Location: [place];  Service: ENT;  Laterality: Right;  PAROTIDECTOMY MASS    Social History   Social History  . Marital Status: Married    Spouse Name: N/A  . Number of Children: N/A  . Years of Education: N/A   Occupational History  . Not on file.   Social History Main Topics  . Smoking status: Never Smoker   . Smokeless tobacco: Never Used  . Alcohol Use: No  . Drug Use: No  . Sexual Activity: Yes   Other Topics  Concern  . Not on file   Social History Narrative     Filed Vitals:   11/13/15 1030  BP: 140/88  Pulse: 85  Height: 5\' 11"  (1.803 m)  Weight: 272 lb (123.378 kg)  SpO2: 95%    PHYSICAL EXAM General: NAD HEENT: Normal. Neck: No JVD, no thyromegaly. Lungs: Clear to auscultation  bilaterally with normal respiratory effort. CV: Nondisplaced PMI. Regular rate and rhythm, normal S1/S2, no S3/S4, no murmur. Trace-1+ pretibial and periankle edema.  Abdomen: Soft, nontender, obese, no distention.  Neurologic: Alert.  Psych: Flat affect. Skin: Normal.  ECG: Most recent ECG reviewed.      ASSESSMENT AND PLAN: 1. CAD: Symptomatically stable. Continue carvedilol, Plavix, and Crestor, along with Imdur 30 mg. Aspirin was previously stopped due to nosebleeds but he has tolerated Plavix.  2. Essential HTN: Reasonably controlled on present therapy which includes amlodipine 5 mg, carvedilol 3.125 mg twice daily, and clonidine patch. No changes.  3. Hyperlipidemia: Continue Crestor 20 mg. Obtain copy of lipids.  4. Leg swelling: Can take an extra 20 mg Lasix in addition to his maintenance dose of 20 mg daily for increased swelling. Wearing compression stockings.  Dispo: f/u 6 months with NP.   Kate Sable, M.D., F.A.C.C.

## 2015-11-13 NOTE — Patient Instructions (Signed)
Medication Instructions:  Your physician recommends that you continue on your current medications as directed. Please refer to the Current Medication list given to you today.   Labwork: NONE  Testing/Procedures: NONE  Follow-Up: Your physician wants you to follow-up in: 6 MONTHS WITH KATHRYN LAWRENCE, NP. You will receive a reminder letter in the mail two months in advance. If you don't receive a letter, please call our office to schedule the follow-up appointment.   Any Other Special Instructions Will Be Listed Below (If Applicable).     If you need a refill on your cardiac medications before your next appointment, please call your pharmacy.   

## 2016-01-23 ENCOUNTER — Encounter (HOSPITAL_COMMUNITY): Payer: Self-pay | Admitting: Emergency Medicine

## 2016-01-23 ENCOUNTER — Observation Stay (HOSPITAL_COMMUNITY)
Admission: EM | Admit: 2016-01-23 | Discharge: 2016-01-27 | Disposition: A | Discharge status: Home / Self Care | Payer: Commercial Managed Care - HMO | Attending: Internal Medicine | Admitting: Internal Medicine

## 2016-01-23 DIAGNOSIS — G20 Parkinson's disease: Secondary | ICD-10-CM | POA: Diagnosis present

## 2016-01-23 DIAGNOSIS — N390 Urinary tract infection, site not specified: Principal | ICD-10-CM | POA: Insufficient documentation

## 2016-01-23 DIAGNOSIS — F29 Unspecified psychosis not due to a substance or known physiological condition: Secondary | ICD-10-CM | POA: Diagnosis present

## 2016-01-23 DIAGNOSIS — M1990 Unspecified osteoarthritis, unspecified site: Secondary | ICD-10-CM | POA: Insufficient documentation

## 2016-01-23 DIAGNOSIS — F22 Delusional disorders: Secondary | ICD-10-CM | POA: Diagnosis not present

## 2016-01-23 DIAGNOSIS — Z85828 Personal history of other malignant neoplasm of skin: Secondary | ICD-10-CM | POA: Diagnosis not present

## 2016-01-23 DIAGNOSIS — Z8673 Personal history of transient ischemic attack (TIA), and cerebral infarction without residual deficits: Secondary | ICD-10-CM | POA: Insufficient documentation

## 2016-01-23 DIAGNOSIS — R258 Other abnormal involuntary movements: Secondary | ICD-10-CM | POA: Diagnosis not present

## 2016-01-23 DIAGNOSIS — I10 Essential (primary) hypertension: Secondary | ICD-10-CM | POA: Diagnosis not present

## 2016-01-23 DIAGNOSIS — R319 Hematuria, unspecified: Secondary | ICD-10-CM

## 2016-01-23 DIAGNOSIS — I2510 Atherosclerotic heart disease of native coronary artery without angina pectoris: Secondary | ICD-10-CM | POA: Diagnosis not present

## 2016-01-23 DIAGNOSIS — Z046 Encounter for general psychiatric examination, requested by authority: Secondary | ICD-10-CM | POA: Diagnosis present

## 2016-01-23 DIAGNOSIS — R443 Hallucinations, unspecified: Secondary | ICD-10-CM | POA: Insufficient documentation

## 2016-01-23 NOTE — ED Notes (Signed)
Per [employer] [place] reported pt became combative and had hallucinations yesterday. IVC papers at bedside. Per EMS pt is calm and showed no aggression or hallucinations during transport. Pt found sitting in urine.

## 2016-01-24 ENCOUNTER — Emergency Department (HOSPITAL_COMMUNITY): Payer: Commercial Managed Care - HMO

## 2016-01-24 DIAGNOSIS — N390 Urinary tract infection, site not specified: Secondary | ICD-10-CM | POA: Diagnosis not present

## 2016-01-24 DIAGNOSIS — F22 Delusional disorders: Secondary | ICD-10-CM

## 2016-01-24 DIAGNOSIS — F29 Unspecified psychosis not due to a substance or known physiological condition: Secondary | ICD-10-CM | POA: Diagnosis present

## 2016-01-24 DIAGNOSIS — R443 Hallucinations, unspecified: Secondary | ICD-10-CM | POA: Insufficient documentation

## 2016-01-24 LAB — CBC WITH DIFFERENTIAL/PLATELET
BASOS ABS: 0 10*3/uL (ref 0.0–0.1)
BASOS PCT: 0 %
EOS ABS: 0.5 10*3/uL (ref 0.0–0.7)
EOS PCT: 4 %
HEMATOCRIT: 43.7 % (ref 39.0–52.0)
Hemoglobin: 15 g/dL (ref 13.0–17.0)
Lymphocytes Relative: 20 %
Lymphs Abs: 2.3 10*3/uL (ref 0.7–4.0)
MCH: 31.5 pg (ref 26.0–34.0)
MCHC: 34.3 g/dL (ref 30.0–36.0)
MCV: 91.8 fL (ref 78.0–100.0)
MONO ABS: 1.3 10*3/uL — AB (ref 0.1–1.0)
MONOS PCT: 12 %
NEUTROS ABS: 7.5 10*3/uL (ref 1.7–7.7)
Neutrophils Relative %: 64 %
PLATELETS: 335 10*3/uL (ref 150–400)
RBC: 4.76 MIL/uL (ref 4.22–5.81)
RDW: 13.5 % (ref 11.5–15.5)
WBC: 11.5 10*3/uL — ABNORMAL HIGH (ref 4.0–10.5)

## 2016-01-24 LAB — COMPREHENSIVE METABOLIC PANEL
ALBUMIN: 4.3 g/dL (ref 3.5–5.0)
ALK PHOS: 80 U/L (ref 38–126)
ALT: 15 U/L — ABNORMAL LOW (ref 17–63)
AST: 21 U/L (ref 15–41)
Anion gap: 8 (ref 5–15)
BILIRUBIN TOTAL: 0.6 mg/dL (ref 0.3–1.2)
BUN: 20 mg/dL (ref 6–20)
CALCIUM: 9.1 mg/dL (ref 8.9–10.3)
CO2: 26 mmol/L (ref 22–32)
CREATININE: 1.19 mg/dL (ref 0.61–1.24)
Chloride: 104 mmol/L (ref 101–111)
GFR calc Af Amer: >60 mL/min (ref >60)
GLUCOSE: 120 mg/dL — AB (ref 65–99)
POTASSIUM: 3.6 mmol/L (ref 3.5–5.1)
Sodium: 138 mmol/L (ref 135–145)
TOTAL PROTEIN: 8.2 g/dL — AB (ref 6.5–8.1)

## 2016-01-24 LAB — URINALYSIS, ROUTINE W REFLEX MICROSCOPIC
BILIRUBIN URINE: NEGATIVE
Glucose, UA: NEGATIVE mg/dL
KETONES UR: NEGATIVE mg/dL
Nitrite: NEGATIVE
PH: 6 (ref 5.0–8.0)
Protein, ur: 100 mg/dL — AB
Specific Gravity, Urine: 1.025 (ref 1.005–1.030)

## 2016-01-24 LAB — CBC
HEMATOCRIT: 40.8 % (ref 39.0–52.0)
Hemoglobin: 13.9 g/dL (ref 13.0–17.0)
MCH: 31.3 pg (ref 26.0–34.0)
MCHC: 34.1 g/dL (ref 30.0–36.0)
MCV: 91.9 fL (ref 78.0–100.0)
PLATELETS: 320 10*3/uL (ref 150–400)
RBC: 4.44 MIL/uL (ref 4.22–5.81)
RDW: 13.6 % (ref 11.5–15.5)
WBC: 9.7 10*3/uL (ref 4.0–10.5)

## 2016-01-24 LAB — ETHANOL

## 2016-01-24 LAB — RAPID URINE DRUG SCREEN, HOSP PERFORMED
Amphetamines: NOT DETECTED
BARBITURATES: NOT DETECTED
BENZODIAZEPINES: POSITIVE — AB
COCAINE: NOT DETECTED
Opiates: NOT DETECTED
Tetrahydrocannabinol: NOT DETECTED

## 2016-01-24 LAB — URINE MICROSCOPIC-ADD ON

## 2016-01-24 LAB — CREATININE, SERUM
Creatinine, Ser: 1.23 mg/dL (ref 0.61–1.24)
GFR calc non Af Amer: 58 mL/min — ABNORMAL LOW (ref >60)

## 2016-01-24 MED ORDER — COLCHICINE 0.6 MG PO TABS
0.6000 mg | ORAL_TABLET | Freq: Every day | ORAL | Status: DC
Start: 2016-01-24 — End: 2016-01-27
  Administered 2016-01-24 – 2016-01-27 (×4): 0.6 mg via ORAL
  Filled 2016-01-24 (×4): qty 1

## 2016-01-24 MED ORDER — ACETAMINOPHEN 650 MG RE SUPP: 650.0000 mg | Freq: Four times a day (QID) | RECTAL | Status: DC | PRN

## 2016-01-24 MED ORDER — POLYSACCHARIDE IRON COMPLEX 150 MG PO CAPS
150.0000 mg | ORAL_CAPSULE | Freq: Two times a day (BID) | ORAL | Status: DC
  Administered 2016-01-24 – 2016-01-27 (×6): 150 mg via ORAL
  Filled 2016-01-24 (×5): qty 1

## 2016-01-24 MED ORDER — ONDANSETRON HCL 4 MG/2ML IJ SOLN: 4.0000 mg | Freq: Four times a day (QID) | INTRAMUSCULAR | Status: DC | PRN

## 2016-01-24 MED ORDER — AMLODIPINE BESYLATE 5 MG PO TABS
5.0000 mg | ORAL_TABLET | Freq: Every day | ORAL | Status: DC
Start: 2016-01-24 — End: 2016-01-27
  Administered 2016-01-24 – 2016-01-27 (×4): 5 mg via ORAL
  Filled 2016-01-24 (×4): qty 1

## 2016-01-24 MED ORDER — GUAIFENESIN ER 600 MG PO TB12: 600.0000 mg | ORAL_TABLET | Freq: Two times a day (BID) | ORAL | Status: DC | PRN

## 2016-01-24 MED ORDER — FUROSEMIDE 20 MG PO TABS
20.0000 mg | ORAL_TABLET | Freq: Every day | ORAL | Status: DC
  Administered 2016-01-24 – 2016-01-27 (×4): 20 mg via ORAL
  Filled 2016-01-24 (×4): qty 1

## 2016-01-24 MED ORDER — ONDANSETRON HCL 4 MG PO TABS: 4.0000 mg | ORAL_TABLET | Freq: Four times a day (QID) | ORAL | Status: DC | PRN

## 2016-01-24 MED ORDER — POLYETHYLENE GLYCOL 3350 17 G PO PACK
17.0000 g | PACK | Freq: Every day | ORAL | Status: DC
  Administered 2016-01-25 – 2016-01-27 (×3): 17 g via ORAL
  Filled 2016-01-24 (×4): qty 1

## 2016-01-24 MED ORDER — VITAMIN B-12 100 MCG PO TABS
100.0000 ug | ORAL_TABLET | Freq: Every day | ORAL | Status: DC
  Administered 2016-01-25 – 2016-01-27 (×3): 100 ug via ORAL
  Filled 2016-01-24 (×5): qty 1

## 2016-01-24 MED ORDER — NITROFURANTOIN MONOHYD MACRO 100 MG PO CAPS
100.0000 mg | ORAL_CAPSULE | Freq: Two times a day (BID) | ORAL | Status: DC
  Administered 2016-01-24 (×2): 100 mg via ORAL
  Filled 2016-01-24 (×8): qty 1

## 2016-01-24 MED ORDER — IPRATROPIUM-ALBUTEROL 0.5-2.5 (3) MG/3ML IN SOLN: 3.0000 mL | Freq: Four times a day (QID) | RESPIRATORY_TRACT | Status: DC | PRN

## 2016-01-24 MED ORDER — ENOXAPARIN SODIUM 60 MG/0.6ML ~~LOC~~ SOLN
60.0000 mg | SUBCUTANEOUS | Status: DC
  Administered 2016-01-24 – 2016-01-26 (×3): 60 mg via SUBCUTANEOUS
  Filled 2016-01-24 (×3): qty 0.6

## 2016-01-24 MED ORDER — PROPYLENE GLYCOL 0.6 % OP SOLN: 2.0000 [drp] | Freq: Every evening | OPHTHALMIC | Status: DC | PRN

## 2016-01-24 MED ORDER — SODIUM CHLORIDE 0.9 % IV SOLN: 250.0000 mL | INTRAVENOUS | Status: DC | PRN

## 2016-01-24 MED ORDER — QUETIAPINE FUMARATE 25 MG PO TABS
50.0000 mg | ORAL_TABLET | Freq: Every day | ORAL | Status: DC
  Administered 2016-01-24 – 2016-01-26 (×3): 50 mg via ORAL
  Filled 2016-01-24 (×3): qty 2

## 2016-01-24 MED ORDER — TRAMADOL HCL 50 MG PO TABS
50.0000 mg | ORAL_TABLET | Freq: Once | ORAL | Status: AC
  Administered 2016-01-24: 50 mg via ORAL
  Filled 2016-01-24: qty 1

## 2016-01-24 MED ORDER — SENNOSIDES-DOCUSATE SODIUM 8.6-50 MG PO TABS: 1.0000 | ORAL_TABLET | Freq: Every evening | ORAL | Status: DC | PRN

## 2016-01-24 MED ORDER — SODIUM CHLORIDE 0.9% FLUSH
3.0000 mL | Freq: Two times a day (BID) | INTRAVENOUS | Status: DC
  Administered 2016-01-24 – 2016-01-26 (×5): 3 mL via INTRAVENOUS

## 2016-01-24 MED ORDER — POLYVINYL ALCOHOL 1.4 % OP SOLN: 2.0000 [drp] | Freq: Every evening | OPHTHALMIC | Status: DC | PRN

## 2016-01-24 MED ORDER — ATORVASTATIN CALCIUM 40 MG PO TABS
40.0000 mg | ORAL_TABLET | Freq: Every day | ORAL | Status: DC
  Administered 2016-01-24 – 2016-01-26 (×3): 40 mg via ORAL
  Filled 2016-01-24 (×3): qty 1

## 2016-01-24 MED ORDER — CLONIDINE HCL 0.1 MG PO TABS: 0.1000 mg | ORAL_TABLET | Freq: Four times a day (QID) | ORAL | Status: DC | PRN

## 2016-01-24 MED ORDER — CARVEDILOL 3.125 MG PO TABS
3.1250 mg | ORAL_TABLET | Freq: Two times a day (BID) | ORAL | Status: DC
  Administered 2016-01-24 – 2016-01-27 (×7): 3.125 mg via ORAL
  Filled 2016-01-24 (×7): qty 1

## 2016-01-24 MED ORDER — CLOPIDOGREL BISULFATE 75 MG PO TABS
75.0000 mg | ORAL_TABLET | Freq: Every day | ORAL | Status: DC
  Administered 2016-01-24 – 2016-01-27 (×4): 75 mg via ORAL
  Filled 2016-01-24 (×4): qty 1

## 2016-01-24 MED ORDER — CARBIDOPA-LEVODOPA 25-100 MG PO TABS
2.0000 | ORAL_TABLET | Freq: Every day | ORAL | Status: DC
  Administered 2016-01-24 – 2016-01-27 (×14): 2 via ORAL
  Filled 2016-01-24 (×8): qty 2
  Filled 2016-01-24 (×2): qty 1
  Filled 2016-01-24 (×4): qty 2

## 2016-01-24 MED ORDER — LEVETIRACETAM 250 MG PO TABS
250.0000 mg | ORAL_TABLET | Freq: Two times a day (BID) | ORAL | Status: DC
  Administered 2016-01-24 – 2016-01-27 (×6): 250 mg via ORAL
  Filled 2016-01-24 (×6): qty 1

## 2016-01-24 MED ORDER — ONDANSETRON HCL 4 MG PO TABS: 4.0000 mg | ORAL_TABLET | Freq: Three times a day (TID) | ORAL | Status: DC | PRN

## 2016-01-24 MED ORDER — ACETAMINOPHEN 325 MG PO TABS: 650.0000 mg | ORAL_TABLET | Freq: Four times a day (QID) | ORAL | Status: DC | PRN

## 2016-01-24 MED ORDER — CLONIDINE HCL 0.1 MG/24HR TD PTWK: 0.1000 mg | MEDICATED_PATCH | TRANSDERMAL | Status: DC

## 2016-01-24 MED ORDER — DONEPEZIL HCL 5 MG PO TABS
5.0000 mg | ORAL_TABLET | Freq: Every day | ORAL | Status: DC
  Administered 2016-01-24 – 2016-01-26 (×3): 5 mg via ORAL
  Filled 2016-01-24 (×3): qty 1

## 2016-01-24 MED ORDER — SODIUM CHLORIDE 0.9% FLUSH: 3.0000 mL | INTRAVENOUS | Status: DC | PRN

## 2016-01-24 MED ORDER — NITROGLYCERIN 0.4 MG SL SUBL: 0.4000 mg | SUBLINGUAL_TABLET | SUBLINGUAL | Status: DC | PRN

## 2016-01-24 MED ORDER — FAMOTIDINE 20 MG PO TABS
20.0000 mg | ORAL_TABLET | Freq: Two times a day (BID) | ORAL | Status: DC | PRN
Start: 2016-01-24 — End: 2016-01-27

## 2016-01-24 MED ORDER — SERTRALINE HCL 50 MG PO TABS
75.0000 mg | ORAL_TABLET | Freq: Every day | ORAL | Status: DC
  Administered 2016-01-24 – 2016-01-27 (×4): 75 mg via ORAL
  Filled 2016-01-24 (×4): qty 2

## 2016-01-24 MED ORDER — NITROFURANTOIN MONOHYD MACRO 100 MG PO CAPS
100.0000 mg | ORAL_CAPSULE | Freq: Two times a day (BID) | ORAL | Status: DC
Start: 2016-01-24 — End: 2016-01-26
  Administered 2016-01-24 – 2016-01-26 (×4): 100 mg via ORAL
  Filled 2016-01-24 (×6): qty 1

## 2016-01-24 MED ORDER — VITAMIN D 1000 UNITS PO TABS
2000.0000 [IU] | ORAL_TABLET | Freq: Every day | ORAL | Status: DC
  Administered 2016-01-24 – 2016-01-27 (×4): 2000 [IU] via ORAL
  Filled 2016-01-24 (×4): qty 2

## 2016-01-24 NOTE — Progress Notes (Signed)
This Probation officer spoke with Hassan Rowan, RN to inform the Extender is ready to complete a telepsych and she agreed to set up the machine.  Chesley Noon, MSW, Darlyn Read [facility] Triage Specialist [phone]

## 2016-01-24 NOTE — ED Provider Notes (Signed)
CSN: [id number]     Arrival date & time 01/23/16  2253 History   First MD Initiated Contact with Patient 01/23/16 2329     Chief Complaint  Patient presents with  . V70.1     (Consider location/radiation/quality/duration/timing/severity/associated sxs/prior Treatment) The history is provided by the patient, the nursing home and the police.   Mark [name] is a 70 y.o. male with past medical history as outlined below and most significant for Parkinsons disease presenting with increasing hallucinations and aggression directed at his nursing home staff at [facility].  Yesterday be became quite agitated and per nursing documentation from the home was hallucinating that some outside his room was killing his daughter.  Additionally he has been combative towards nursing staff and has refused all care including taking medications and engaging in personal care, refusing bathing and self care. He presents soaked in urine.   He presents with involuntary commitment papers from the nursing facility.  He denies any physical complaints at this time and denies auditory or visual hallucinations and denies homicidal or suicidal ideation.   He was seen here in 2014 for a similar event and was evaluated by psychiatry but was not admitted for psychiatric care as his were felt to be related to his Parkinson's and deterioration of cognitive function associated with this condition.    Past Medical History  Diagnosis Date  . Hypertension   . PAF (paroxysmal atrial fibrillation) ([place])   . Parkinson's disease   . Gout   . Coronary artery disease     SEVERE 3-VESSEL DX WITH NORMAL EF  . Fall   . Fracture of right olecranon process 10/23/2011  . Arthritis   . Stroke ([place]) 10/12    Hx of remote stroke, RT SIDED WEAKNESS  . Non-ST elevated myocardial infarction (non-STEMI) ([place])   . MI (myocardial infarction) ([place]) 08/2011    /H&P (05/12/2012)  . Sleep apnea   . Seizures ([place]) 12/10/2011    HAD SEIZURE  . Skin cancer      left shoulder  . Bacteremia due to Escherichia coli 06/21/2012  . Pyelonephritis, acute 06/23/2012  . 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  . Kidney stones   . Cervical vertebral fracture [facility])    Past Surgical History  Procedure Laterality Date  . Transthoracic echocardiogram  06/26/2011    EF 55-60%  . Orif elbow fracture  12/29/2011    Procedure: OPEN REDUCTION INTERNAL FIXATION (ORIF) ELBOW/OLECRANON FRACTURE;  Surgeon: Johnny Bridge, MD;  Location: [place];  Service: Orthopedics;  Laterality: Right;  . Hardware removal  12/29/2011    Procedure: HARDWARE REMOVAL;  Surgeon: Johnny Bridge, MD;  Location: [place];  Service: Orthopedics;  Laterality: Right;  . Parotidectomy  05/12/2012    right  . Cardiac catheterization  06/26/2011    NORMAL LV SYSTOLIC FUNCTION. RCA IS LARGE AND DOMINANT  . Coronary angioplasty with stent placement  ~ 2000    "one that I know of"  . Skin cancer excision  2013    left shoulder  . Parotidectomy  05/12/2012    Procedure: PAROTIDECTOMY;  Surgeon: Rozetta Nunnery, MD;  Location: [place];  Service: ENT;  Laterality: Right;  PAROTIDECTOMY MASS   Family History  Problem Relation Age of Onset  . Heart disease    . Arthritis    . Colon cancer Neg Hx   . Liver disease Neg Hx    Social History  Substance Use Topics  . Smoking status:  Never Smoker   . Smokeless tobacco: Never Used  . Alcohol Use: No    Review of Systems  Constitutional: Negative.   HENT: Negative.   Respiratory: Negative.   Cardiovascular: Negative.   Genitourinary: Negative.   Musculoskeletal: Negative.   Psychiatric/Behavioral: Positive for hallucinations and behavioral problems. Negative for suicidal ideas and self-injury.      Allergies  Rocephin and Ciprofloxacin  Home Medications   Prior to Admission medications   Medication Sig Start Date End Date Taking? Authorizing Provider  amLODipine (NORVASC) 5 MG tablet Take 5 mg by mouth daily.    Historical Provider, MD   carbidopa-levodopa (SINEMET) 25-100 MG per tablet Take 1 tablet by mouth 3 (three) times daily.  09/23/11   Historical Provider, MD  carvedilol (COREG) 3.125 MG tablet Take 1 tablet (3.125 mg total) by mouth 2 (two) times daily with a meal. 04/05/12   Thayer Headings, MD  cefTRIAXone (ROCEPHIN) 1 G injection Inject 1 g into the muscle daily. For 7 days(start 03/06/15) takes last dose 03/12/15 at 1700    Historical Provider, MD  Cholecalciferol (VITAMIN D) 2000 UNITS tablet Take 2,000 Units by mouth daily.    Historical Provider, MD  cloNIDine (CATAPRES - DOSED IN MG/24 HR) 0.1 mg/24hr patch Place 0.1 mg onto the skin once a week.    Historical Provider, MD  cloNIDine (CATAPRES) 0.1 MG tablet Take 0.1 mg by mouth every 6 (six) hours as needed (BP greather than 160/90).    Historical Provider, MD  clopidogrel (PLAVIX) 75 MG tablet Take 1 tablet (75 mg total) by mouth daily. 05/15/12   Rozetta Nunnery, MD  colchicine 0.6 MG tablet Take 0.6 mg by mouth daily.    Historical Provider, MD  diphenhydrAMINE (SOMINEX) 25 MG tablet Take 25 mg by mouth at bedtime as needed for sleep.    Historical Provider, MD  donepezil (ARICEPT) 10 MG tablet Take 10 mg by mouth at bedtime.    Historical Provider, MD  famotidine (PEPCID) 20 MG tablet Take 20 mg by mouth as needed for heartburn or indigestion.    Historical Provider, MD  furosemide (LASIX) 20 MG tablet Take 1 tablet (20 mg total) by mouth daily. 03/20/15   Herminio Commons, MD  HYDROcodone-acetaminophen (NORCO) 7.5-325 MG per tablet Take 1 tablet by mouth every 6 (six) hours as needed for moderate pain.    Historical Provider, MD  ipratropium-albuterol (DUONEB) 0.5-2.5 (3) MG/3ML SOLN Take 3 mLs by nebulization every 6 (six) hours as needed (SHORTNESS OF BREATH).     Historical Provider, MD  iron polysaccharides (NIFEREX) 150 MG capsule Take 1 capsule (150 mg total) by mouth 2 (two) times daily. 06/25/12   Kathie Dike, MD  isosorbide mononitrate (IMDUR) 30  MG 24 hr tablet Take 1 tablet (30 mg total) by mouth daily. 08/22/14   Erline Hau, MD  levETIRAcetam (KEPPRA) 250 MG tablet Take 250 mg by mouth 2 (two) times daily.  09/23/11   Historical Provider, MD  LORazepam (ATIVAN) 1 MG tablet Take 1 mg by mouth daily.    Historical Provider, MD  nitroGLYCERIN (NITROSTAT) 0.4 MG SL tablet Place 0.4 mg under the tongue every 5 (five) minutes as needed for chest pain.    Historical Provider, MD  oxymetazoline (AFRIN) 0.05 % nasal spray Place 1 spray into both nostrils as needed for congestion.    Historical Provider, MD  polyethylene glycol (MIRALAX / GLYCOLAX) packet Take 17 g by mouth daily.    Historical  Provider, MD  Propylene Glycol (SYSTANE BALANCE) 0.6 % SOLN Apply 2 drops to eye at bedtime as needed (DRY EYES).    Historical Provider, MD  rosuvastatin (CRESTOR) 20 MG tablet Take 1 tablet (20 mg total) by mouth daily. 04/05/12   Thayer Headings, MD  senna-docusate (SENOKOT-S) 8.6-50 MG per tablet Take 1 tablet by mouth 2 (two) times daily.    Historical Provider, MD  sertraline (ZOLOFT) 25 MG tablet Take 25-75 mg by mouth 2 (two) times daily.     Historical Provider, MD  traMADol (ULTRAM) 50 MG tablet Take 50 mg by mouth 3 (three) times daily.     Historical Provider, MD  vitamin B-12 (CYANOCOBALAMIN) 100 MCG tablet Take 100 mcg by mouth daily.    Historical Provider, MD   BP 165/105 mmHg  Pulse 99  Temp(Src) 98.5 F (36.9 C)  Resp 22  SpO2 98% Physical Exam  Constitutional: He appears well-developed and well-nourished.  Unkempt appearance  HENT:  Head: Normocephalic and atraumatic.  Mouth/Throat: Oropharynx is clear and moist. No oropharyngeal exudate.  Eyes: Conjunctivae and EOM are normal. Pupils are equal, round, and reactive to light.  Neck: Normal range of motion.  Cardiovascular: Normal rate, regular rhythm, normal heart sounds and intact distal pulses.   Pulmonary/Chest: Effort normal and breath sounds normal. No  respiratory distress. He has no wheezes.  Abdominal: Soft. Bowel sounds are normal. There is no tenderness. There is no guarding.  Musculoskeletal: He exhibits edema.  Ankle edema   Neurological: He is alert. He displays tremor. No cranial nerve deficit or sensory deficit.  Answers questions appropriately but slow to respond. Equal grip strength.  Moves all extremities without deficit.  Gait not assessed. Bradykinesia present, marked Parkinsons stare.   Skin: Skin is warm and dry.  Psychiatric: His affect is blunt. He is slowed. He expresses no suicidal plans and no homicidal plans.  No delusional thoughts or hallucinations expressed during initial interview.  Nursing note and vitals reviewed.   ED Course  Procedures (including critical care time) Labs Review Labs Reviewed  COMPREHENSIVE METABOLIC PANEL - Abnormal; Notable for the following:    Glucose, Bld 120 (*)    Total Protein 8.2 (*)    ALT 15 (*)    All other components within normal limits  CBC WITH DIFFERENTIAL/PLATELET - Abnormal; Notable for the following:    WBC 11.5 (*)    Monocytes Absolute 1.3 (*)    All other components within normal limits  ETHANOL  URINE RAPID DRUG SCREEN, HOSP PERFORMED  URINALYSIS, ROUTINE W REFLEX MICROSCOPIC (NOT AT [facility])    Imaging Review Ct Head Wo Contrast  01/24/2016  CLINICAL DATA:  Became combative and having hallucinations yesterday, confusion, history hypertension, Parkinson's, paroxysmal atrial fibrillation, stroke, coronary artery disease post MI EXAM: CT HEAD WITHOUT CONTRAST TECHNIQUE: Contiguous axial images were obtained from the base of the skull through the vertex without intravenous contrast. COMPARISON:  02/15/2013 FINDINGS: Generalized atrophy. Normal ventricular morphology. No midline shift or mass effect. Small vessel chronic ischemic changes of deep cerebral white matter. No intracranial hemorrhage, mass lesion, or evidence acute infarction. No extra-axial fluid  collections. Sinuses clear and bones unremarkable. IMPRESSION: No acute intracranial abnormalities. Atrophy with small vessel chronic ischemic changes of deep cerebral white matter. Electronically Signed   By: Lavonia Dana M.D.   On: 01/24/2016 01:21   I have personally reviewed and evaluated these images and lab results as part of my medical decision-making.   EKG Interpretation None  MDM   Final diagnoses:  None    Pt with hallucinations and increased agitation and aggressive behavior directed toward nursing staff at [facility] facility.  Presents with involuntary commitment papers in need of TTS consult.  Medical clearance in process.  Will order the TTS consult which is currently pending.    Discussed with Dr. Leonides Schanz who will continue to follow.    Evalee Jefferson, PA-C 01/24/16 0203  2:19 AM New evidence of hallucinations.  Pt now complaining of glass on hands and within right flank trying to "work its way out" .  Examined and reassured pt there is no glass.    Evalee Jefferson, PA-C 01/24/16 [place], DO 01/24/16 [phone]

## 2016-01-24 NOTE — ED Notes (Signed)
TTS machine st-up in room.

## 2016-01-24 NOTE — ED Notes (Signed)
Wife, JASRAJ, GALLIAN [phone]

## 2016-01-24 NOTE — ED Notes (Signed)
Pt stating he has glass in his forearms and abd, none observed or seen PA notified.
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
## 2016-01-24 NOTE — ED Notes (Signed)
Pt refused TTS per Sitter.

## 2016-01-24 NOTE — ED Notes (Signed)
Pt placed in a gown at this time, unable to remove shirt. Pt was incontinent and changed. Pt has diffuse redness noted to buttocks, yeast noted in abd folds, and had white discharge around head of penis after retracting foreskin. Pt requested ted hose to be removed, they were saturated with urine. Pt has pitting edema noted bilaterally.
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
## 2016-01-24 NOTE — Progress Notes (Signed)
This Probation officer is completing a chart review for psych consult.    Mark [name], MSW, Darlyn Read [facility] Triage Specialist [phone]

## 2016-01-24 NOTE — ED Notes (Signed)
TTS in progress 

## 2016-01-24 NOTE — ED Provider Notes (Signed)
.   Please see previous physicians note regarding patient's presenting history and physical, initial ED course, and associated medical decision making. In short, this 70 year old male who presents with hallucinations and paranoia in the setting of a UTI. Was initially sent to the ED for psych evaluation, and found to have UTI. Multiple antibiotic allergies, and was given Macrobid by previous provider. Was evaluated by TTS, not that time had waxing and waning mental status changes. Appeared to have paranoia, aggression and hallucinations with TTS although previously was not having this in the ED. On my evaluation, patient not very forthcoming but states he feels fine. I suspect that he has UTI causing delirium. I will admit to medicine service for treatment and observation over clearance of his delirium. If adequately treated and has persistent mental status changes, TTS states that they will reconsult to discuss [place] psych placement.  Forde Dandy, MD 01/24/16 [phone]
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
## 2016-01-25 DIAGNOSIS — I10 Essential (primary) hypertension: Secondary | ICD-10-CM | POA: Diagnosis not present

## 2016-01-25 DIAGNOSIS — G20 Parkinson's disease: Secondary | ICD-10-CM

## 2016-01-25 DIAGNOSIS — N390 Urinary tract infection, site not specified: Secondary | ICD-10-CM

## 2016-01-25 DIAGNOSIS — F22 Delusional disorders: Secondary | ICD-10-CM | POA: Diagnosis not present

## 2016-01-25 LAB — CBC
HCT: 39.4 % (ref 39.0–52.0)
Hemoglobin: 13.3 g/dL (ref 13.0–17.0)
MCH: 31.4 pg (ref 26.0–34.0)
MCHC: 33.8 g/dL (ref 30.0–36.0)
MCV: 92.9 fL (ref 78.0–100.0)
PLATELETS: 280 10*3/uL (ref 150–400)
RBC: 4.24 MIL/uL (ref 4.22–5.81)
RDW: 13.8 % (ref 11.5–15.5)
WBC: 8.4 10*3/uL (ref 4.0–10.5)

## 2016-01-25 LAB — BASIC METABOLIC PANEL
Anion gap: 8 (ref 5–15)
BUN: 18 mg/dL (ref 6–20)
CALCIUM: 8.8 mg/dL — AB (ref 8.9–10.3)
CO2: 24 mmol/L (ref 22–32)
CREATININE: 1.19 mg/dL (ref 0.61–1.24)
Chloride: 107 mmol/L (ref 101–111)
GFR calc non Af Amer: >60 mL/min (ref >60)
Glucose, Bld: 103 mg/dL — ABNORMAL HIGH (ref 65–99)
Potassium: 3.4 mmol/L — ABNORMAL LOW (ref 3.5–5.1)
Sodium: 139 mmol/L (ref 135–145)

## 2016-01-25 LAB — MRSA PCR SCREENING: MRSA BY PCR: NEGATIVE

## 2016-01-25 NOTE — Care Management Obs Status (Deleted)
[place] NOTIFICATION   Patient Details  Name: Mark [name] MRN: [id number] Date of Birth: 1946-08-05   Medicare Observation Status Notification Given:     Family at bedside able to sign for patient.     Briant Sites, RN 01/25/2016, 2:32 PM

## 2016-01-25 NOTE — Progress Notes (Signed)
[facility] PROGRESS NOTE  ARDAN ASTA [id number] DOB: 1945/10/27    PCP:   Hilbert Corrigan, MD   HPI: Mark [name] is an 70 y.o. male with hx of parkinson, afib, HTN, prior stroke, admitted IVC for psychosis and threatening to kill his wife.  He was found to have possible UTI, though UA wasn't strongly positive, and culture is pending.  He was given Macrodantin for UTI.  TelePsych was consulted, recommended continue with UTI tx and reassess.  I spoke at length with his wife, who was not concerned about his threat, and felt that he was confused.    Rewiew of Systems:  Constitutional: Negative for malaise, fever and chills. No significant weight loss or weight gain Eyes: Negative for eye pain, redness and discharge, diplopia, visual changes, or flashes of light. ENMT: Negative for ear pain, hoarseness, nasal congestion, sinus pressure and sore throat. No headaches; tinnitus, drooling, or problem swallowing. Cardiovascular: Negative for chest pain, palpitations, diaphoresis, dyspnea and peripheral edema. ; No orthopnea, PND Respiratory: Negative for cough, hemoptysis, wheezing and stridor. No pleuritic chestpain. Gastrointestinal: Negative for nausea, vomiting, diarrhea, constipation, abdominal pain, melena, blood in stool, hematemesis, jaundice and rectal bleeding.    Genitourinary: Negative for frequency, dysuria, incontinence,flank pain and hematuria; Musculoskeletal: Negative for back pain and neck pain. Negative for swelling and trauma.;  Skin: . Negative for pruritus, rash, abrasions, bruising and skin lesion.; ulcerations Neuro: Negative for headache, lightheadedness and neck stiffness. Negative for weakness, altered level of consciousness , altered mental status, extremity weakness, burning feet, involuntary movement, seizure and syncope.  Psych: negative for anxiety, depression, insomnia, tearfulness, panic attacks, hallucinations, paranoia, suicidal or homicidal  ideation    Past Medical History  Diagnosis Date  . Hypertension   . PAF (paroxysmal atrial fibrillation) ([place])   . Parkinson's disease   . Gout   . Coronary artery disease     SEVERE 3-VESSEL DX WITH NORMAL EF  . Fall   . Fracture of right olecranon process 10/23/2011  . Arthritis   . Stroke ([place]) 10/12    Hx of remote stroke, RT SIDED WEAKNESS  . Non-ST elevated myocardial infarction (non-STEMI) ([place])   . MI (myocardial infarction) ([place]) 08/2011    /H&P (05/12/2012)  . Sleep apnea   . Seizures ([place]) 12/10/2011    HAD SEIZURE  . Skin cancer     left shoulder  . Bacteremia due to Escherichia coli 06/21/2012  . Pyelonephritis, acute 06/23/2012  . 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  . Kidney stones   . Cervical vertebral fracture [facility])     Past Surgical History  Procedure Laterality Date  . Transthoracic echocardiogram  06/26/2011    EF 55-60%  . Orif elbow fracture  12/29/2011    Procedure: OPEN REDUCTION INTERNAL FIXATION (ORIF) ELBOW/OLECRANON FRACTURE;  Surgeon: Johnny Bridge, MD;  Location: [facility];  Service: Orthopedics;  Laterality: Right;  . Hardware removal  12/29/2011    Procedure: HARDWARE REMOVAL;  Surgeon: Johnny Bridge, MD;  Location: [facility];  Service: Orthopedics;  Laterality: Right;  . Parotidectomy  05/12/2012    right  . Cardiac catheterization  06/26/2011    NORMAL LV SYSTOLIC FUNCTION. RCA IS LARGE AND DOMINANT  . Coronary angioplasty with stent placement  ~ 2000    "one that I know of"  . Skin cancer excision  2013    left shoulder  . Parotidectomy  05/12/2012    Procedure: PAROTIDECTOMY;  Surgeon: Rozetta Nunnery,  MD;  Location: [facility];  Service: ENT;  Laterality: Right;  PAROTIDECTOMY MASS    Medications:  HOME MEDS: Prior to Admission medications   Medication Sig Start Date End Date Taking? Authorizing Provider  acetaminophen (TYLENOL) 500 MG tablet Take 1,000 mg by mouth every 6 (six) hours as needed (pain/fever).   Yes Historical Provider, MD   amLODipine (NORVASC) 5 MG tablet Take 5 mg by mouth daily.   Yes Historical Provider, MD  atorvastatin (LIPITOR) 40 MG tablet Take 40 mg by mouth daily.   Yes Historical Provider, MD  carbidopa-levodopa (SINEMET) 25-100 MG per tablet Take 2 tablets by mouth 5 (five) times daily.  09/23/11  Yes Historical Provider, MD  carvedilol (COREG) 3.125 MG tablet Take 1 tablet (3.125 mg total) by mouth 2 (two) times daily with a meal. 04/05/12  Yes Thayer Headings, MD  Cholecalciferol (VITAMIN D) 2000 UNITS tablet Take 2,000 Units by mouth daily.   Yes Historical Provider, MD  cloNIDine (CATAPRES - DOSED IN MG/24 HR) 0.1 mg/24hr patch Place 0.1 mg onto the skin once a week.   Yes Historical Provider, MD  clopidogrel (PLAVIX) 75 MG tablet Take 1 tablet (75 mg total) by mouth daily. 05/15/12  Yes Rozetta Nunnery, MD  colchicine 0.6 MG tablet Take 0.6 mg by mouth daily.   Yes Historical Provider, MD  donepezil (ARICEPT) 5 MG tablet Take 5 mg by mouth at bedtime.   Yes Historical Provider, MD  furosemide (LASIX) 20 MG tablet Take 1 tablet (20 mg total) by mouth daily. 03/20/15  Yes Herminio Commons, MD  furosemide (LASIX) 20 MG tablet Take 20 mg by mouth daily.    Yes Historical Provider, MD  iron polysaccharides (NIFEREX) 150 MG capsule Take 1 capsule (150 mg total) by mouth 2 (two) times daily. 06/25/12  Yes Kathie Dike, MD  levETIRAcetam (KEPPRA) 250 MG tablet Take 250 mg by mouth 2 (two) times daily.  09/23/11  Yes Historical Provider, MD  LORazepam (ATIVAN) 1 MG tablet Take 1 mg by mouth daily.   Yes Historical Provider, MD  ondansetron (ZOFRAN) 4 MG tablet Take 4 mg by mouth every 8 (eight) hours as needed for nausea or vomiting.   Yes Historical Provider, MD  polyethylene glycol (MIRALAX / GLYCOLAX) packet Take 17 g by mouth daily.   Yes Historical Provider, MD  QUEtiapine (SEROQUEL) 25 MG tablet Take 50 mg by mouth at bedtime.   Yes Historical Provider, MD  senna-docusate (SENOKOT-S) 8.6-50 MG per  tablet Take 2 tablets by mouth 2 (two) times daily.    Yes Historical Provider, MD  traMADol (ULTRAM) 50 MG tablet Take 50 mg by mouth 3 (three) times daily.    Yes Historical Provider, MD  vitamin B-12 (CYANOCOBALAMIN) 100 MCG tablet Take 100 mcg by mouth daily.   Yes Historical Provider, MD  cefTRIAXone (ROCEPHIN) 1 G injection Inject 1 g into the muscle daily. For 7 days(start 03/06/15) takes last dose 03/12/15 at 1700    Historical Provider, MD  cloNIDine (CATAPRES) 0.1 MG tablet Take 0.1 mg by mouth every 6 (six) hours as needed (BP greather than 160/90).    Historical Provider, MD  diphenhydrAMINE (SOMINEX) 25 MG tablet Take 25 mg by mouth at bedtime as needed for sleep.    Historical Provider, MD  diphenhydrAMINE (SOMINEX) 25 MG tablet Take 25 mg by mouth every 6 (six) hours as needed for itching or sleep.    Historical Provider, MD  famotidine (PEPCID) 20 MG tablet Take  20 mg by mouth as needed for heartburn or indigestion.    Historical Provider, MD  guaiFENesin (MUCINEX) 600 MG 12 hr tablet Take 600 mg by mouth every 12 (twelve) hours as needed (congestion).    Historical Provider, MD  HYDROcodone-acetaminophen (NORCO) 7.5-325 MG per tablet Take 1 tablet by mouth every 6 (six) hours as needed for moderate pain.    Historical Provider, MD  ipratropium-albuterol (DUONEB) 0.5-2.5 (3) MG/3ML SOLN Take 3 mLs by nebulization every 6 (six) hours as needed (SHORTNESS OF BREATH).     Historical Provider, MD  isosorbide mononitrate (IMDUR) 30 MG 24 hr tablet Take 1 tablet (30 mg total) by mouth daily. Patient not taking: Reported on 01/24/2016 08/22/14   Erline Hau, MD  nitroGLYCERIN (NITROSTAT) 0.4 MG SL tablet Place 0.4 mg under the tongue every 5 (five) minutes as needed for chest pain.    Historical Provider, MD  oxymetazoline (AFRIN) 0.05 % nasal spray Place 2 sprays into both nostrils as needed (nosebleeds).     Historical Provider, MD  Propylene Glycol (SYSTANE BALANCE) 0.6 % SOLN  Apply 2 drops to eye at bedtime as needed (DRY EYES).    Historical Provider, MD  rosuvastatin (CRESTOR) 20 MG tablet Take 1 tablet (20 mg total) by mouth daily. Patient not taking: Reported on 01/24/2016 04/05/12   Thayer Headings, MD  sertraline (ZOLOFT) 25 MG tablet Take 75 mg by mouth daily.     Historical Provider, MD     Allergies:  Allergies  Allergen Reactions  . Rocephin [Ceftriaxone Sodium In Dextrose] Other (See Comments)    Unknown reaction  . Ciprofloxacin Rash    Social History:   reports that he has never smoked. He has never used smokeless tobacco. He reports that he does not drink alcohol or use illicit drugs.  Family History: Family History  Problem Relation Age of Onset  . Heart disease    . Arthritis    . Colon cancer Neg Hx   . Liver disease Neg Hx      Physical Exam: Filed Vitals:   01/24/16 1726 01/24/16 2154 01/25/16 0505 01/25/16 1306  BP: 160/83 132/73 155/74   Pulse: 92 87 76   Temp: 98.8 F (37.1 C) 98.7 F (37.1 C) 98 F (36.7 C)   TempSrc: Oral Oral Oral   Resp: 20 20 20    Height: 6\' 2"  (1.88 m)     Weight: 125.9 kg (277 lb 9 oz)     SpO2: 96% 95% 100% 95%   Blood pressure 155/74, pulse 76, temperature 98 F (36.7 C), temperature source Oral, resp. rate 20, height 6\' 2"  (1.88 m), weight 125.9 kg (277 lb 9 oz), SpO2 95 %.  GEN:  Pleasant  patient lying in the stretcher in no acute distress; cooperative with exam. PSYCH:  alert and oriented x4; does not appear anxious or depressed; affect is appropriate. HEENT: Mucous membranes pink and anicteric; PERRLA; EOM intact; no cervical lymphadenopathy nor thyromegaly or carotid bruit; no JVD; There were no stridor. Neck is very supple. Breasts:: Not examined CHEST WALL: No tenderness CHEST: Normal respiration, clear to auscultation bilaterally.  HEART: Regular rate and rhythm.  There are no murmur, rub, or gallops.   BACK: No kyphosis or scoliosis; no CVA tenderness ABDOMEN: soft and  non-tender; no masses, no organomegaly, normal abdominal bowel sounds; no pannus; no intertriginous candida. There is no rebound and no distention. Rectal Exam: Not done EXTREMITIES: No bone or joint deformity; age-appropriate arthropathy of  the hands and knees; no edema; no ulcerations.  There is no calf tenderness. Genitalia: not examined PULSES: 2+ and symmetric SKIN: Normal hydration no rash or ulceration CNS: Cranial nerves 2-12 grossly intact no focal lateralizing neurologic deficit.  Speech is fluent; uvula elevated with phonation, facial symmetry and tongue midline. DTR are normal bilaterally, cerebella exam is intact, barbinski is negative and strengths are equaled bilaterally.  No sensory loss.   Labs on Admission:  Basic Metabolic Panel:  Recent Labs Lab 01/24/16 0029 01/24/16 1814 01/25/16 0506  NA 138  --  139  K 3.6  --  3.4*  CL 104  --  107  CO2 26  --  24  GLUCOSE 120*  --  103*  BUN 20  --  18  CREATININE 1.19 1.23 1.19  CALCIUM 9.1  --  8.8*   Liver Function Tests:  Recent Labs Lab 01/24/16 0029  AST 21  ALT 15*  ALKPHOS 80  BILITOT 0.6  PROT 8.2*  ALBUMIN 4.3   CBC:  Recent Labs Lab 01/24/16 0029 01/24/16 1814 01/25/16 0506  WBC 11.5* 9.7 8.4  NEUTROABS 7.5  --   --   HGB 15.0 13.9 13.3  HCT 43.7 40.8 39.4  MCV 91.8 91.9 92.9  PLT 335 320 280    Radiological Exams on Admission: Ct Head Wo Contrast  01/24/2016  CLINICAL DATA:  Became combative and having hallucinations yesterday, confusion, history hypertension, Parkinson's, paroxysmal atrial fibrillation, stroke, coronary artery disease post MI EXAM: CT HEAD WITHOUT CONTRAST TECHNIQUE: Contiguous axial images were obtained from the base of the skull through the vertex without intravenous contrast. COMPARISON:  02/15/2013 FINDINGS: Generalized atrophy. Normal ventricular morphology. No midline shift or mass effect. Small vessel chronic ischemic changes of deep cerebral white matter. No  intracranial hemorrhage, mass lesion, or evidence acute infarction. No extra-axial fluid collections. Sinuses clear and bones unremarkable. IMPRESSION: No acute intracranial abnormalities. Atrophy with small vessel chronic ischemic changes of deep cerebral white matter. Electronically Signed   By: Lavonia Dana M.D.   On: 01/24/2016 01:21   Assessment/Plan Present on Admission:  . UTI (lower urinary tract infection) . HTN (hypertension) . Parkinson disease ([place]) . Psychosis  PLAN:  Psychosis:  Will continue with Tx for UTI, and he is likely clear medically.  Will await disposition from psychiatry.  Will follow up with Psych tomorrow.  Wife would like to have him sent back once cleared.   He is alert and conversing and appeared mentally appropriate today.   He did not recall anything.   HTN:  Stable.  Parkinson's: Continue with meds.   Other plans as per orders. Code Status:FULL CODE>    Orvan Falconer, MD.  FACP [facility] Pager [phone] 7pm to 7am.  01/25/2016, 2:21 PM

## 2016-01-25 NOTE — Care Management Obs Status (Signed)
[facility] NOTIFICATION   Patient Details  Name: Mark [name] MRN: [id number] Date of Birth: 1946/06/25   Medicare Observation Status Notification Given:  Yes  Family at bedside able to sign for patient.   Briant Sites, RN 01/25/2016, 2:35 PM

## 2016-01-26 DIAGNOSIS — F22 Delusional disorders: Secondary | ICD-10-CM | POA: Diagnosis not present

## 2016-01-26 DIAGNOSIS — N390 Urinary tract infection, site not specified: Secondary | ICD-10-CM | POA: Diagnosis not present

## 2016-01-26 LAB — URINE CULTURE

## 2016-01-26 MED ORDER — AMOXICILLIN 250 MG PO CAPS
500.0000 mg | ORAL_CAPSULE | Freq: Three times a day (TID) | ORAL | Status: DC
  Administered 2016-01-26 – 2016-01-27 (×4): 500 mg via ORAL
  Filled 2016-01-26 (×4): qty 2

## 2016-01-26 NOTE — Progress Notes (Signed)
PROGRESS NOTE    Mark [name]  [id number] DOB: [birth year] DOA: 01/23/2016 PCP: Hilbert Corrigan, MD     Brief Narrative:  70 year old man admitted on 5/18 with a UTI and psychosis. Seen by psychiatry and placed under involuntary commitment with recommendations for medical admission for treatment of UTI and consideration for further inpatient psychiatric care if psychosis remains after treatment of UTI. He is now 48 hours after initiation of antibiotics and his mental status appears improved. Will request psychiatry to reevaluate in a.m. If inpatient psychiatric care is not recommended, can discharge back to SNF in 24 hours.   Assessment & Plan:   Principal Problem:   Psychosis, paranoid ([place]) Active Problems:   UTI (lower urinary tract infection)   HTN (hypertension)   Parkinson disease ([place])   Psychosis   Proteus UTI -Resistant only to Macrobid, antibiotics have been transitioned over to amoxicillin which he should continue for at least 5 more days.  Psychosis -Improved, unclear if 100% related to UTI. -Will request reconsultation by psychiatry in a.m. for further recommendations.  Parkinson's disease -At baseline, continue Sinemet  Hypertension -Well controlled, continue current medications    DVT prophylaxis: Lovenox Code Status: Full code Family Communication: Patient only Disposition Plan: Inpatient psych versus return to SNF  Consultants:   Psychiatry  Procedures:   None  Antimicrobials:   Amoxicillin    Subjective: Mental status is improved although he remains confused  Objective: Filed Vitals:   01/25/16 1400 01/25/16 2110 01/26/16 0620 01/26/16 1400  BP: 139/70 131/63 139/78 140/80  Pulse: 75 78 69 74  Temp: 98.1 F (36.7 C) 97.5 F (36.4 C) 98.2 F (36.8 C) 98.6 F (37 C)  TempSrc:  Oral Oral   Resp: 20 20 20 20   Height:   6\' 2"  (1.88 m)   Weight:   125.6 kg (276 lb 14.4 oz)   SpO2: 98% 96% 94% 96%    Intake/Output  Summary (Last 24 hours) at 01/26/16 1926 Last data filed at 01/26/16 1747  Gross per 24 hour  Intake    720 ml  Output    250 ml  Net    470 ml   Filed Weights   01/24/16 1726 01/26/16 0620  Weight: 125.9 kg (277 lb 9 oz) 125.6 kg (276 lb 14.4 oz)    Examination:   General exam: Alert, awake, oriented x 2 Respiratory system: Clear to auscultation. Respiratory effort normal. Cardiovascular system:RRR. No murmurs, rubs, gallops. Gastrointestinal system: Abdomen is nondistended, soft and nontender. No organomegaly or masses felt. Normal bowel sounds heard. Central nervous system: Alert and oriented. No focal neurological deficits. Extremities: No C/C/E, +pedal pulses Skin: No rashes, lesions or ulcers    Data Reviewed: I have personally reviewed following labs and imaging studies  CBC:  Recent Labs Lab 01/24/16 0029 01/24/16 1814 01/25/16 0506  WBC 11.5* 9.7 8.4  NEUTROABS 7.5  --   --   HGB 15.0 13.9 13.3  HCT 43.7 40.8 39.4  MCV 91.8 91.9 92.9  PLT 335 320 [id number]   Basic Metabolic Panel:  Recent Labs Lab 01/24/16 0029 01/24/16 1814 01/25/16 0506  NA 138  --  139  K 3.6  --  3.4*  CL 104  --  107  CO2 26  --  24  GLUCOSE 120*  --  103*  BUN 20  --  18  CREATININE 1.19 1.23 1.19  CALCIUM 9.1  --  8.8*   GFR: Estimated Creatinine Clearance: 82.5 mL/min (by C-G formula  based on Cr of 1.19). Liver Function Tests:  Recent Labs Lab 01/24/16 0029  AST 21  ALT 15*  ALKPHOS 80  BILITOT 0.6  PROT 8.2*  ALBUMIN 4.3   No results for input(s): LIPASE, AMYLASE in the last 168 hours. No results for input(s): AMMONIA in the last 168 hours. Coagulation Profile: No results for input(s): INR, PROTIME in the last 168 hours. Cardiac Enzymes: No results for input(s): CKTOTAL, CKMB, CKMBINDEX, TROPONINI in the last 168 hours. BNP (last 3 results) No results for input(s): PROBNP in the last 8760 hours. HbA1C: No results for input(s): HGBA1C in the last 72  hours. CBG: No results for input(s): GLUCAP in the last 168 hours. Lipid Profile: No results for input(s): CHOL, HDL, LDLCALC, TRIG, CHOLHDL, LDLDIRECT in the last 72 hours. Thyroid Function Tests: No results for input(s): TSH, T4TOTAL, FREET4, T3FREE, THYROIDAB in the last 72 hours. Anemia Panel: No results for input(s): VITAMINB12, FOLATE, FERRITIN, TIBC, IRON, RETICCTPCT in the last 72 hours. Urine analysis:    Component Value Date/Time   COLORURINE AMBER* 01/24/2016 0211   APPEARANCEUR CLOUDY* 01/24/2016 0211   LABSPEC 1.025 01/24/2016 0211   PHURINE 6.0 01/24/2016 0211   GLUCOSEU NEGATIVE 01/24/2016 0211   HGBUR LARGE* 01/24/2016 0211   BILIRUBINUR NEGATIVE 01/24/2016 0211   KETONESUR NEGATIVE 01/24/2016 0211   PROTEINUR 100* 01/24/2016 0211   UROBILINOGEN 0.2 12/26/2013 0817   NITRITE NEGATIVE 01/24/2016 0211   LEUKOCYTESUR SMALL* 01/24/2016 0211   Sepsis Labs: @LABRCNTIP (procalcitonin:4,lacticidven:4)  ) Recent Results (from the past 240 hour(s))  Urine culture     Status: Abnormal   Collection Time: 01/24/16  2:11 AM  Result Value Ref Range Status   Specimen Description URINE, CATHETERIZED  Final   Special Requests NONE  Final   Culture >=100,000 COLONIES/mL PROTEUS MIRABILIS (A)  Final   Report Status 01/26/2016 FINAL  Final   Organism ID, Bacteria PROTEUS MIRABILIS (A)  Final      Susceptibility   Proteus mirabilis - MIC*    AMPICILLIN <=2 SENSITIVE Sensitive     CEFAZOLIN 8 SENSITIVE Sensitive     CEFTRIAXONE <=1 SENSITIVE Sensitive     CIPROFLOXACIN 1 SENSITIVE Sensitive     GENTAMICIN <=1 SENSITIVE Sensitive     IMIPENEM 1 SENSITIVE Sensitive     NITROFURANTOIN 128 RESISTANT Resistant     TRIMETH/SULFA <=20 SENSITIVE Sensitive     AMPICILLIN/SULBACTAM <=2 SENSITIVE Sensitive     PIP/TAZO <=4 SENSITIVE Sensitive     * >=100,000 COLONIES/mL PROTEUS MIRABILIS  MRSA PCR Screening     Status: None   Collection Time: 01/25/16  1:25 AM  Result Value Ref  Range Status   MRSA by PCR NEGATIVE NEGATIVE Final    Comment:        The GeneXpert MRSA Assay (FDA approved for NASAL specimens only), is one component of a comprehensive MRSA colonization surveillance program. It is not intended to diagnose MRSA infection nor to guide or monitor treatment for MRSA infections.          Radiology Studies: No results found.      Scheduled Meds: . amLODipine  5 mg Oral Daily  . amoxicillin  500 mg Oral Q8H  . atorvastatin  40 mg Oral q1800  . carbidopa-levodopa  2 tablet Oral 5 X Daily  . carvedilol  3.125 mg Oral BID WC  . cholecalciferol  2,000 Units Oral Daily  . [START ON 01/28/2016] cloNIDine  0.1 mg Transdermal Weekly  . clopidogrel  75 mg  Oral Daily  . colchicine  0.6 mg Oral Daily  . donepezil  5 mg Oral QHS  . enoxaparin (LOVENOX) injection  60 mg Subcutaneous Q24H  . furosemide  20 mg Oral Daily  . iron polysaccharides  150 mg Oral BID  . levETIRAcetam  250 mg Oral BID  . polyethylene glycol  17 g Oral Daily  . QUEtiapine  50 mg Oral QHS  . sertraline  75 mg Oral Daily  . sodium chloride flush  3 mL Intravenous Q12H  . vitamin B-12  100 mcg Oral Daily   Continuous Infusions:       Time spent: 25 minutes. Greater than 50% of this time was spent in direct contact with the patient coordinating care.     Lelon Frohlich, MD [facility] Pager [phone]  If 7PM-7AM, please contact night-coverage [email or web address] Password [facility] 01/26/2016, 7:26 PM

## 2016-01-27 DIAGNOSIS — N390 Urinary tract infection, site not specified: Secondary | ICD-10-CM | POA: Diagnosis not present

## 2016-01-27 DIAGNOSIS — F22 Delusional disorders: Secondary | ICD-10-CM | POA: Diagnosis not present

## 2016-01-27 MED ORDER — AMOXICILLIN 500 MG PO CAPS: 500.0000 mg | ORAL_CAPSULE | Freq: Three times a day (TID) | ORAL | Status: AC

## 2016-01-27 NOTE — Clinical Social Work Note (Signed)
Clinical Social Work Assessment  Patient Details  Name: Mark [name] MRN: [id number] Date of Birth: 09/19/1945  Date of referral:  01/27/16               Reason for consult:  Discharge Planning                Permission sought to share information with:  Family Supports, Facility Contact Representative Permission granted to share information::  Yes, Verbal Permission Granted  Name::     Sara Bencosme (wife)  Agency::  [facility]  Relationship::  yes  Contact Information:  yes  Housing/Transportation Living arrangements for the past 2 months:  Skilled Nursing Facility Source of Information:  Patient, Spouse Patient Interpreter Needed:  None Criminal Activity/Legal Involvement Pertinent to Current Situation/Hospitalization:  No - Comment as needed Significant Relationships:   Wife Lives with:   Facility resident Do you feel safe going back to the place where you live?    yes Need for family participation in patient care:   yes  Care giving concerns: In discussion none identified, wife reports he has done well at [facility]   Social Worker assessment / plan:  LCSW met with patient and his wife Sara. Patient is a Long term resident of [facility] and as per [name] is able to return. He has Parkinsons,Epilepsy-and suffers from Major depressive disorder. Patient has given verbal consnet to speak to wife and facility. He has [employer] Medicare/medicaid. He requires full assistance with his ADLs with the exception of feeding, he does this on his own. Patient was polite, clear and calm. He is being treated for disorientation and UTI. We are awaiting a psychiatric consult. Patient oriented x3. Patient is incontinent and uses a wheel chair.  Employment status:  Retired ([employer]) Insurance information:  Medicare, Medicaid In State ([employer]) PT Recommendations:  Skilled Nursing Facility Information / Referral to community resources:    None requested by patient  Patient/Family's Response to care: They will be  happy when his UTI clears up  Patient/Family's Understanding of and Emotional Response to Diagnosis, Current Treatment, and Prognosis:  Good understanding  Emotional Assessment Appearance:   good Attitude/Demeanor/Rapport:   Pleasant, clear thoughts, calm Affect (typically observed):   Calm Orientation:   x3 Alcohol / Substance use:   none Psych involvement (Current and /or in the community):  Yes (Comment) (Has MDD and is treated by facility doctor)  Discharge Needs  Concerns to be addressed:  No discharge needs identified Readmission within the last 30 days:  No Current discharge risk:  None Barriers to Discharge:  No Barriers Identified   ,  M, LCSW 01/27/2016, 9:15 AM  

## 2016-01-27 NOTE — Clinical Social Work Note (Signed)
Pt d/c today back to [facility]. Pt has been psychiatrically cleared and okay for return per [facility]. Pt and wife aware and agreeable. Will transport via [facility].   Benay Pike, [place]
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
## 2016-01-27 NOTE — Clinical Social Work Note (Signed)
Per TTS, pt has been psychiatrically cleared. CSW sent notification to [place] to rescind IVC and verified this was received.   Benay Pike, [place]

## 2016-01-27 NOTE — Care Management Note (Signed)
Case Management Note  Patient Details  Name: Mark [name] MRN: [id number] Date of Birth: [birth year]  Subjective/Objective:                  Pt admitted with psychosis. Pt from SNF and discharging back to SNF today. CSW is aware and has made arrangements for return to facility.   Action/Plan: No CM needs.   Expected Discharge Date:  01/27/16               Expected Discharge Plan:  Skilled Nursing Facility  In-House Referral:  Clinical Social Work  Discharge planning Services  CM Consult  Post Acute Care Choice:  NA Choice offered to:  NA  DME Arranged:    DME Agency:     HH Arranged:    [place] Agency:     Status of Service:  Completed, signed off  Medicare Important Message Given:    Date Medicare IM Given:    Medicare IM give by:    Date Additional Medicare IM Given:    Additional Medicare Important Message give by:     If discussed at [place] of Stay Meetings, dates discussed:    Additional Comments:  Sherald Barge, RN 01/27/2016, 1:55 PM
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
## 2016-01-27 NOTE — Progress Notes (Signed)
Patient discharged back to [facility], report called,and given to [name] LPN. Vital signs stable. No c/o pain or discomfort noted. Transported by [place] squad to awaiting facility.

## 2016-01-27 NOTE — NC FL2 (Signed)
[place] LEVEL OF CARE SCREENING TOOL     IDENTIFICATION  Patient Name: Mark [name] Birthdate: February 13, 1946 Sex: male Admission Date (Current Location): 01/23/2016  [facility] and [place] Number:  [employer] and Address:  [place] [address], [place]      Provider Number: [id number]  Attending Physician Name and Address:  Koleen Nimrod Acost*  Relative Name and Phone Number:       Current Level of Care: Hospital Recommended Level of Care: [place] Prior Approval Number:    Date Approved/Denied:   PASRR Number: [id number]  Discharge Plan: SNF    Current Diagnoses: Patient Active Problem List   Diagnosis Date Noted  . 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 Psychosis 01/24/2016  . Hallucinations   . Chest pain 08/22/2014  . Coronary artery disease involving native coronary artery of native heart with other form of angina pectoris ([place])   . Essential hypertension   . Hyperlipidemia   . Paroxysmal atrial fibrillation (HCC)   . Colon cancer screening 01/03/2014  . Elevated LFTs 10/05/2013  . Liver lesion 06/23/2012  . Pyelonephritis, acute 06/23/2012  . Hypotension 06/21/2012  . Bacteremia due to Escherichia coli 06/21/2012  . Sepsis ([place]) 06/20/2012  . FTT (failure to thrive) in adult 06/19/2012  . ARF (acute renal failure) ([place]) 06/19/2012  . UTI (lower urinary tract infection) 06/19/2012  . Dehydration 06/19/2012  . Frequent falls 06/19/2012  . Hypokalemia 06/19/2012  . Anemia 06/19/2012  . Seizure disorder ([place]) 06/19/2012  . Fracture of right olecranon process 10/23/2011  . Olecranon fracture, recurrent 10/19/2011  . Fall 08/13/2011  . CAD (coronary artery disease) 07/14/2011  . HTN (hypertension) 07/14/2011  . Parkinson disease ([place]) 07/14/2011    Orientation RESPIRATION BLADDER Height & Weight     Self, Situation, Place  Normal Incontinent Weight: 275 lb 5.7 oz (124.9  kg) Height:  6\' 2"  (188 cm)  BEHAVIORAL SYMPTOMS/MOOD NEUROLOGICAL BOWEL NUTRITION STATUS    Petit mal (Epilepsy- No petite mal seizure 2 years) Incontinent Diet (Normal)  AMBULATORY STATUS COMMUNICATION OF NEEDS Skin   Extensive Assist Verbally Normal                       Personal Care Assistance Level of Assistance  Bathing, Feeding, Dressing, Total care Bathing Assistance: Maximum assistance Feeding assistance: Independent Dressing Assistance: Maximum assistance Total Care Assistance: Maximum assistance   Functional Limitations Info  Sight, Hearing, Speech Sight Info: Adequate Hearing Info: Adequate Speech Info: Adequate    SPECIAL CARE FACTORS FREQUENCY                       Contractures      Additional Factors Info  Code Status Code Status Info: Full             Current Medications (01/27/2016):  This is the current hospital active medication list Current Facility-Administered Medications  Medication Dose Route Frequency Provider Last Rate Last Dose  . 0.9 %  sodium chloride infusion  250 mL Intravenous PRN Erline Hau, MD      . acetaminophen (TYLENOL) tablet 650 mg  650 mg Oral Q6H PRN Erline Hau, MD       Or  . acetaminophen (TYLENOL) suppository 650 mg  650 mg Rectal Q6H PRN Erline Hau, MD      . amLODipine (NORVASC) tablet 5 mg  5 mg  Oral Daily Erline Hau, MD   5 mg at 01/26/16 0855  . amoxicillin (AMOXIL) capsule 500 mg  500 mg Oral Q8H Estela Leonie Green, MD   500 mg at 01/27/16 0514  . atorvastatin (LIPITOR) tablet 40 mg  40 mg Oral q1800 Erline Hau, MD   40 mg at 01/26/16 1628  . carbidopa-levodopa (SINEMET IR) 25-100 MG per tablet immediate release 2 tablet  2 tablet Oral 5 X Daily Estela Leonie Green, MD   2 tablet at 01/27/16 0514  . carvedilol (COREG) tablet 3.125 mg  3.125 mg Oral BID WC Erline Hau, MD   3.125 mg at 01/27/16 0818  .  cholecalciferol (VITAMIN D) tablet 2,000 Units  2,000 Units Oral Daily Erline Hau, MD   2,000 Units at 01/26/16 [phone]  . [START ON 01/28/2016] cloNIDine (CATAPRES - Dosed in mg/24 hr) patch 0.1 mg  0.1 mg Transdermal Weekly Estela Leonie Green, MD      . cloNIDine (CATAPRES) tablet 0.1 mg  0.1 mg Oral Q6H PRN Erline Hau, MD      . clopidogrel (PLAVIX) tablet 75 mg  75 mg Oral Daily Erline Hau, MD   75 mg at 01/26/16 0854  . colchicine tablet 0.6 mg  0.6 mg Oral Daily Erline Hau, MD   0.6 mg at 01/26/16 0855  . donepezil (ARICEPT) tablet 5 mg  5 mg Oral QHS Erline Hau, MD   5 mg at 01/26/16 2011  . enoxaparin (LOVENOX) injection 60 mg  60 mg Subcutaneous Q24H Erline Hau, MD   60 mg at 01/26/16 2010  . famotidine (PEPCID) tablet 20 mg  20 mg Oral BID PRN Erline Hau, MD      . furosemide (LASIX) tablet 20 mg  20 mg Oral Daily Erline Hau, MD   20 mg at 01/26/16 0854  . guaiFENesin (MUCINEX) 12 hr tablet 600 mg  600 mg Oral Q12H PRN Erline Hau, MD      . ipratropium-albuterol (DUONEB) 0.5-2.5 (3) MG/3ML nebulizer solution 3 mL  3 mL Nebulization Q6H PRN Erline Hau, MD      . iron polysaccharides (NIFEREX) capsule 150 mg  150 mg Oral BID Erline Hau, MD   150 mg at 01/26/16 2011  . levETIRAcetam (KEPPRA) tablet 250 mg  250 mg Oral BID Erline Hau, MD   250 mg at 01/26/16 2012  . nitroGLYCERIN (NITROSTAT) SL tablet 0.4 mg  0.4 mg Sublingual Q5 min PRN Erline Hau, MD      . ondansetron [facility]) tablet 4 mg  4 mg Oral Q6H PRN Erline Hau, MD       Or  . ondansetron [facility]) injection 4 mg  4 mg Intravenous Q6H PRN Erline Hau, MD      . ondansetron [facility]) tablet 4 mg  4 mg Oral Q8H PRN Erline Hau, MD      . polyethylene glycol (MIRALAX / GLYCOLAX) packet 17 g  17 g Oral  Daily Erline Hau, MD   17 g at 01/26/16 0859  . polyvinyl alcohol (LIQUIFILM TEARS) 1.4 % ophthalmic solution 2 drop  2 drop Both Eyes QHS PRN Erline Hau, MD      . QUEtiapine (SEROQUEL) tablet 50 mg  50 mg Oral QHS Koleen Nimrod  Deniece Ree, MD   50 mg at 01/26/16 2011  . senna-docusate (Senokot-S) tablet 1 tablet  1 tablet Oral QHS PRN Erline Hau, MD      . sertraline (ZOLOFT) tablet 75 mg  75 mg Oral Daily Erline Hau, MD   75 mg at 01/26/16 0854  . sodium chloride flush (NS) 0.9 % injection 3 mL  3 mL Intravenous Q12H Erline Hau, MD   3 mL at 01/26/16 2013  . sodium chloride flush (NS) 0.9 % injection 3 mL  3 mL Intravenous PRN Erline Hau, MD      . vitamin B-12 (CYANOCOBALAMIN) tablet 100 mcg  100 mcg Oral Daily Estela Leonie Green, MD   100 mcg at 01/26/16 [id number]     Discharge Medications: Please see discharge summary for a list of discharge medications.  Relevant Imaging Results:  Relevant Lab Results:   Additional Information SSN # [id number]  Joana Reamer, [place]

## 2016-01-27 NOTE — Progress Notes (Signed)
Received word that psychiatry consult was placed for pt. Pt was evaluated via telepsych by T. Bobby Rumpf, NP, and case discussed with Dr. Dwyane Dee. CSW was informed that pt was deemed stable and cleared for d/c from psychiatric standpoint.

## 2016-05-27 ENCOUNTER — Ambulatory Visit: Payer: Medicare HMO | Admitting: Neurology

## 2016-05-29 ENCOUNTER — Encounter: Payer: Self-pay | Admitting: Cardiovascular Disease

## 2016-05-29 ENCOUNTER — Ambulatory Visit (INDEPENDENT_AMBULATORY_CARE_PROVIDER_SITE_OTHER): Payer: Medicare HMO | Admitting: Cardiovascular Disease

## 2016-05-29 VITALS — BP 122/74 | HR 79 | Ht 74.0 in | Wt 275.0 lb

## 2016-05-29 DIAGNOSIS — R600 Localized edema: Secondary | ICD-10-CM | POA: Diagnosis not present

## 2016-05-29 DIAGNOSIS — M7989 Other specified soft tissue disorders: Secondary | ICD-10-CM

## 2016-05-29 DIAGNOSIS — I25118 Atherosclerotic heart disease of native coronary artery with other forms of angina pectoris: Secondary | ICD-10-CM | POA: Diagnosis not present

## 2016-05-29 DIAGNOSIS — E785 Hyperlipidemia, unspecified: Secondary | ICD-10-CM

## 2016-05-29 DIAGNOSIS — I8290 Acute embolism and thrombosis of unspecified vein: Secondary | ICD-10-CM

## 2016-05-29 DIAGNOSIS — I2510 Atherosclerotic heart disease of native coronary artery without angina pectoris: Secondary | ICD-10-CM

## 2016-05-29 DIAGNOSIS — I10 Essential (primary) hypertension: Secondary | ICD-10-CM

## 2016-05-29 NOTE — Patient Instructions (Signed)
Your physician wants you to follow-up in:  1 year Dr Virgina Jock will receive a reminder letter in the mail two months in advance. If you don't receive a letter, please call our office to schedule the follow-up appointment.   You have been referred to vascular surgery in [place] [facility] may take extra Lasix 20 mg as needed for swelling     Thank you for choosing [facility] !

## 2016-05-29 NOTE — Progress Notes (Signed)
SUBJECTIVE: Mark [name] returns for follow up. He has a history of Parkinson's disease, non-STEMI in 2012 with three-vessel coronary artery disease which has been treated medically, hypertension and hyperlipidemia. His wife, Mark [name], is with him.  Denies chest pain and shortness of breath.   His wife tells me that he was found to have a "blood clot" in the left carotid artery. This was reportedly performed by a mobile imaging unit. I do not have a copy of these results. He has had persistent left arm swelling. She said he does not have any blood clots in his arm as far she knows but she is not certain. He is monitored by a facility physician.  Creatinine 1.389/11/17.  Takes Lasix 20 mg daily for leg swelling and wears compression stockings.   Review of Systems: As per "subjective", otherwise negative.  Allergies  Allergen Reactions  . Rocephin [Ceftriaxone Sodium In Dextrose] Other (See Comments)    Unknown reaction  . Ciprofloxacin Rash    Current Outpatient Prescriptions  Medication Sig Dispense Refill  . acetaminophen (TYLENOL) 500 MG tablet Take 1,000 mg by mouth every 6 (six) hours as needed (pain/fever).    Marland Kitchen amLODipine (NORVASC) 5 MG tablet Take 5 mg by mouth daily.    Marland Kitchen atorvastatin (LIPITOR) 40 MG tablet Take 40 mg by mouth daily.    . carbidopa-levodopa (SINEMET IR) 25-100 MG tablet Take by mouth 3 (three) times daily.    . carvedilol (COREG) 3.125 MG tablet Take 1 tablet (3.125 mg total) by mouth 2 (two) times daily with a meal. 60 tablet 6  . Cholecalciferol (VITAMIN D) 2000 UNITS tablet Take 2,000 Units by mouth daily.    . cloNIDine (CATAPRES - DOSED IN MG/24 HR) 0.1 mg/24hr patch Place 0.1 mg onto the skin once a week.    . clopidogrel (PLAVIX) 75 MG tablet Take 1 tablet (75 mg total) by mouth daily. 30 tablet 6  . colchicine 0.6 MG tablet Take 0.6 mg by mouth daily.    Marland Kitchen donepezil (ARICEPT) 5 MG tablet Take 5 mg by mouth at bedtime.    . famotidine (PEPCID)  20 MG tablet Take 20 mg by mouth as needed for heartburn or indigestion.    . furosemide (LASIX) 20 MG tablet Take 20 mg by mouth daily.     Marland Kitchen guaiFENesin (MUCINEX) 600 MG 12 hr tablet Take 600 mg by mouth every 12 (twelve) hours as needed (congestion).    Marland Kitchen ipratropium-albuterol (DUONEB) 0.5-2.5 (3) MG/3ML SOLN Take 3 mLs by nebulization every 6 (six) hours as needed (SHORTNESS OF BREATH).     Marland Kitchen iron polysaccharides (NIFEREX) 150 MG capsule Take 1 capsule (150 mg total) by mouth 2 (two) times daily. 60 capsule 0  . levETIRAcetam (KEPPRA) 250 MG tablet Take 250 mg by mouth 2 (two) times daily.     . nitroGLYCERIN (NITROSTAT) 0.4 MG SL tablet Place 0.4 mg under the tongue every 5 (five) minutes as needed for chest pain.    Marland Kitchen ondansetron (ZOFRAN) 4 MG tablet Take 4 mg by mouth every 8 (eight) hours as needed for nausea or vomiting.    Marland Kitchen oxymetazoline (AFRIN) 0.05 % nasal spray Place 2 sprays into both nostrils as needed (nosebleeds).     . polyethylene glycol (MIRALAX / GLYCOLAX) packet Take 17 g by mouth daily.    Marland Kitchen Propylene Glycol (SYSTANE BALANCE) 0.6 % SOLN Apply 2 drops to eye at bedtime as needed (DRY EYES).    . QUEtiapine (  SEROQUEL) 25 MG tablet Take 50 mg by mouth at bedtime.    . senna-docusate (SENOKOT-S) 8.6-50 MG per tablet Take 2 tablets by mouth 2 (two) times daily.     . sertraline (ZOLOFT) 25 MG tablet Take 75 mg by mouth daily.     . traMADol (ULTRAM) 50 MG tablet Take 50 mg by mouth 3 (three) times daily.     . vitamin B-12 (CYANOCOBALAMIN) 100 MCG tablet Take 100 mcg by mouth daily.     No current facility-administered medications for this visit.     Past Medical History:  Diagnosis Date  . Arthritis   . Bacteremia due to Escherichia coli 06/21/2012  . Cervical vertebral fracture ([place])   . Coronary artery disease    SEVERE 3-VESSEL DX WITH NORMAL EF  . Fall   . Fracture of right olecranon process 10/23/2011  . Gout   . Hypertension   . Kidney stones   . MI  (myocardial infarction) ([place]) 08/2011   /H&P (05/12/2012)  . Non-ST elevated myocardial infarction (non-STEMI) ([place])   . PAF (paroxysmal atrial fibrillation) ([place])   . Parkinson's disease   . 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 Adenoma.  . Pyelonephritis, acute 06/23/2012  . Seizures ([place]) 12/10/2011   HAD SEIZURE  . Skin cancer    left shoulder  . Sleep apnea   . Stroke ([place]) 10/12   Hx of remote stroke, RT SIDED WEAKNESS    Past Surgical History:  Procedure Laterality Date  . CARDIAC CATHETERIZATION  06/26/2011   NORMAL LV SYSTOLIC FUNCTION. RCA IS LARGE AND DOMINANT  . CORONARY ANGIOPLASTY WITH STENT PLACEMENT  ~ 2000   "one that I know of"  . HARDWARE REMOVAL  12/29/2011   Procedure: HARDWARE REMOVAL;  Surgeon: Johnny Bridge, MD;  Location: [facility];  Service: Orthopedics;  Laterality: Right;  . ORIF ELBOW FRACTURE  12/29/2011   Procedure: OPEN REDUCTION INTERNAL FIXATION (ORIF) ELBOW/OLECRANON FRACTURE;  Surgeon: Johnny Bridge, MD;  Location: [facility];  Service: Orthopedics;  Laterality: Right;  . PAROTIDECTOMY  05/12/2012   right  . PAROTIDECTOMY  05/12/2012   Procedure: PAROTIDECTOMY;  Surgeon: Rozetta Nunnery, MD;  Location: [facility];  Service: ENT;  Laterality: Right;  PAROTIDECTOMY MASS  . SKIN CANCER EXCISION  2013   left shoulder  . TRANSTHORACIC ECHOCARDIOGRAM  06/26/2011   EF 55-60%    Social History   Social History  . Marital status: Married    Spouse name: N/A  . Number of children: N/A  . Years of education: N/A   Occupational History  . Not on file.   Social History Main Topics  . Smoking status: Never Smoker  . Smokeless tobacco: Never Used  . Alcohol use No  . Drug use: No  . Sexual activity: Yes   Other Topics Concern  . Not on file   Social History Narrative  . No narrative on file     Vitals:   05/29/16 1455  BP: 122/74  Pulse: 79  SpO2: 94%  Weight: 275 lb (124.7 kg)  Height: 6\' 2"  (1.88 m)    PHYSICAL EXAM General: NAD HEENT:  Normal. Neck: No JVD, no thyromegaly. Lungs: Clear to auscultation bilaterally with normal respiratory effort. CV: Nondisplaced PMI. Regular rate and rhythm, normal S1/S2, no S3/S4, no murmur. Trace pretibial and periankle edema.  Abdomen: Soft, nontender, obese, no distention.  Neurologic: Alert.  Psych: Flat affect. Skin: Normal.    ECG: Most recent ECG reviewed.  ASSESSMENT AND PLAN: 1. CAD: Symptomatically stable. Continue carvedilol, Plavix, and Lipitor. Aspirin was previously stopped due to nosebleeds but he has tolerated Plavix. No longer on Imdur.  2. Essential HTN: Controlled. No changes.  3. Hyperlipidemia: Continue Lipitor.  4. Leg swelling: Can take an extra 20 mg Lasix in addition to his maintenance dose of 20 mg daily for increased swelling. Wearing compression stockings.  5. Left arm swelling and "blood clot" in left carotid: I am uncertain about the etiology of his left arm swelling. He may benefit from more detailed noninvasive imaging. I will make a vascular surgery referral.  Dispo: f/u 1 year   Kate Sable, M.D., F.A.C.C.

## 2016-06-08 ENCOUNTER — Ambulatory Visit (INDEPENDENT_AMBULATORY_CARE_PROVIDER_SITE_OTHER): Payer: Medicare HMO | Admitting: Neurology

## 2016-06-08 ENCOUNTER — Encounter: Payer: Self-pay | Admitting: Neurology

## 2016-06-08 ENCOUNTER — Encounter: Payer: Self-pay | Admitting: *Deleted

## 2016-06-08 VITALS — BP 148/92 | HR 74

## 2016-06-08 DIAGNOSIS — G20 Parkinson's disease: Secondary | ICD-10-CM

## 2016-06-08 DIAGNOSIS — I25119 Atherosclerotic heart disease of native coronary artery with unspecified angina pectoris: Secondary | ICD-10-CM

## 2016-06-08 DIAGNOSIS — R443 Hallucinations, unspecified: Secondary | ICD-10-CM

## 2016-06-08 DIAGNOSIS — I480 Paroxysmal atrial fibrillation: Secondary | ICD-10-CM

## 2016-06-08 DIAGNOSIS — G40909 Epilepsy, unspecified, not intractable, without status epilepticus: Secondary | ICD-10-CM

## 2016-06-08 NOTE — Progress Notes (Signed)
PATIENT: Mark [name] DOB: September 24, 1945  Chief Complaint  Patient presents with  . Parkinsons Disease    MMSE 22/28 - 8 animals (unable to write). He is here with his wife, Mark [name], to establish care for his Parksinson's Disease.  He lives at [facility] (nursing home in [place]).  His wife states he rarely has problems with tremors. He has problems with forgetfulness, hallucinations and agitation.  He sometimes refuses his medications due to being suspicious of what the staff is giving him.  At times, he is verbally aggressive. He has history of stroke.     HISTORICAL  Mark [name] is a 70 years old right-handed male, accompanied by his wife Mark [name], seen in refer by his primary care doctor Hilbert Corrigan for evaluation of Parkinson's disease,  He came in with wheelchair today, he is currently a resident of [facility] since 2013 Reviewed and summarized the referring note, he had a history of atrial fibrillation on chronic anticoagulation Eliquis, seizure, coronary artery disease, status post stent in 2012, hypertension, history of stroke, memory loss, sleep apnea, hyperlipidemia,  He was diagnosed with Parkinson's disease since 2007, initial symptoms was bradykinesia, difficulty to get in and out of shower, later noted shuffling gait, was treated by Dr. Jannifer Franklin before.  Then he was treated by Dr. Janann Colonel over the past few years.  He was treated with sinemet for many years, now taking Sinemet 25/100 2 tabs tid,  for a while he was taking high dose of Sinemet 25/100 mg up to 2 tablets 5 times a day, even at that dosage, he could not walk, could not stand up even with that dose.  He had sepsis from UTI in 2013, was discharged to nursing home since then, at that time he become wheelchair dependent, also totally dependent on his daily activity, shower, bathing, toileting, feeding, brushing his teeth. He remained in that level of care since.  He has frequent UTI, chronic constipation.   He has no incontinence.  He also had worsening memory loss, he is a retired Tax adviser at age 4 years old, he also had chronic low back pain, he still play cards, bingo, he is easily agitated, he sleeps well, he has delusional idea sometimes, very sensitive to sound,  He is on polypharmacy treatment, zoloft '25mg'$  qday, seroquel '25mg'$  2 tabs qhs, aricept '5mg'$  qhs, sinemet 25/100 2 tabs tid.    Overall, he had slowly decline over the past few years, but no dramatic changes, he needs more help to get in and out of his wheelchair, needing lift to transfer. He has recent dysphagia, was seen by speech pathologist, improved with neck flexion technique  Laboratory evaluation in September 2017, elevated creatinine 1.38, glucose 105, hemoglobin 13 point 5, ESR 18,  He had history of seizure since 2007, he is now taking keppra '250mg'$  bid, last seizure was around 2013.  REVIEW OF SYSTEMS: Full 14 system review of systems performed and notable only for swelling in legs, cough, wheezing, easy bruising, easy bleeding, joint pain, joint swelling, aching muscles, restless leg  ALLERGIES: Allergies  Allergen Reactions  . Rocephin [Ceftriaxone Sodium In Dextrose] Other (See Comments)    Unknown reaction  . Ciprofloxacin Rash    HOME MEDICATIONS: Current Outpatient Prescriptions  Medication Sig Dispense Refill  . acetaminophen (TYLENOL) 500 MG tablet Take 1,000 mg by mouth every 6 (six) hours as needed (pain/fever).    Marland Kitchen amLODipine (NORVASC) 5 MG tablet Take 5 mg by mouth daily.    Marland Kitchen  atorvastatin (LIPITOR) 40 MG tablet Take 40 mg by mouth daily.    . carbidopa-levodopa (SINEMET IR) 25-100 MG tablet Take by mouth 3 (three) times daily.    . carvedilol (COREG) 3.125 MG tablet Take 1 tablet (3.125 mg total) by mouth 2 (two) times daily with a meal. 60 tablet 6  . Cholecalciferol (VITAMIN D) 2000 UNITS tablet Take 2,000 Units by mouth daily.    . cloNIDine (CATAPRES - DOSED IN MG/24 HR) 0.1 mg/24hr patch Place  0.1 mg onto the skin once a week.    . clopidogrel (PLAVIX) 75 MG tablet Take 1 tablet (75 mg total) by mouth daily. 30 tablet 6  . colchicine 0.6 MG tablet Take 0.6 mg by mouth daily.    Marland Kitchen donepezil (ARICEPT) 5 MG tablet Take 5 mg by mouth at bedtime.    . famotidine (PEPCID) 20 MG tablet Take 20 mg by mouth as needed for heartburn or indigestion.    . furosemide (LASIX) 20 MG tablet Take 20 mg by mouth daily.     Marland Kitchen guaiFENesin (MUCINEX) 600 MG 12 hr tablet Take 600 mg by mouth every 12 (twelve) hours as needed (congestion).    Marland Kitchen ipratropium-albuterol (DUONEB) 0.5-2.5 (3) MG/3ML SOLN Take 3 mLs by nebulization every 6 (six) hours as needed (SHORTNESS OF BREATH).     Marland Kitchen iron polysaccharides (NIFEREX) 150 MG capsule Take 1 capsule (150 mg total) by mouth 2 (two) times daily. 60 capsule 0  . levETIRAcetam (KEPPRA) 250 MG tablet Take 250 mg by mouth 2 (two) times daily.     . nitroGLYCERIN (NITROSTAT) 0.4 MG SL tablet Place 0.4 mg under the tongue every 5 (five) minutes as needed for chest pain.    Marland Kitchen ondansetron (ZOFRAN) 4 MG tablet Take 4 mg by mouth every 8 (eight) hours as needed for nausea or vomiting.    Marland Kitchen oxymetazoline (AFRIN) 0.05 % nasal spray Place 2 sprays into both nostrils as needed (nosebleeds).     . polyethylene glycol (MIRALAX / GLYCOLAX) packet Take 17 g by mouth daily.    Marland Kitchen Propylene Glycol (SYSTANE BALANCE) 0.6 % SOLN Apply 2 drops to eye at bedtime as needed (DRY EYES).    . QUEtiapine (SEROQUEL) 25 MG tablet Take 50 mg by mouth at bedtime.    . senna-docusate (SENOKOT-S) 8.6-50 MG per tablet Take 2 tablets by mouth 2 (two) times daily.     . sertraline (ZOLOFT) 25 MG tablet Take 75 mg by mouth daily.     . traMADol (ULTRAM) 50 MG tablet Take 50 mg by mouth 3 (three) times daily.     . vitamin B-12 (CYANOCOBALAMIN) 100 MCG tablet Take 100 mcg by mouth daily.     No current facility-administered medications for this visit.     PAST MEDICAL HISTORY: Past Medical History:    Diagnosis Date  . Arthritis   . Bacteremia due to Escherichia coli 06/21/2012  . Cervical vertebral fracture ([place])   . Coronary artery disease    SEVERE 3-VESSEL DX WITH NORMAL EF  . Fall   . Fracture of right olecranon process 10/23/2011  . Gout   . Hypertension   . Kidney stones   . MI (myocardial infarction) 08/2011   /H&P (05/12/2012)  . Non-ST elevated myocardial infarction (non-STEMI) ([facility])   . PAF (paroxysmal atrial fibrillation) ([facility])   . Parkinson's disease   . 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 Adenoma.  . Pyelonephritis, acute 06/23/2012  . Seizures ([facility]) 12/10/2011   HAD  SEIZURE  . Skin cancer    left shoulder  . Sleep apnea   . Stroke ([facility]) 10/12   Hx of remote stroke, RT SIDED WEAKNESS    PAST SURGICAL HISTORY: Past Surgical History:  Procedure Laterality Date  . CARDIAC CATHETERIZATION  06/26/2011   NORMAL LV SYSTOLIC FUNCTION. RCA IS LARGE AND DOMINANT  . CORONARY ANGIOPLASTY WITH STENT PLACEMENT  ~ 2000   "one that I know of"  . HARDWARE REMOVAL  12/29/2011   Procedure: HARDWARE REMOVAL;  Surgeon: Johnny Bridge, MD;  Location: [facility];  Service: Orthopedics;  Laterality: Right;  . ORIF ELBOW FRACTURE  12/29/2011   Procedure: OPEN REDUCTION INTERNAL FIXATION (ORIF) ELBOW/OLECRANON FRACTURE;  Surgeon: Johnny Bridge, MD;  Location: [facility];  Service: Orthopedics;  Laterality: Right;  . PAROTIDECTOMY  05/12/2012   right  . PAROTIDECTOMY  05/12/2012   Procedure: PAROTIDECTOMY;  Surgeon: Rozetta Nunnery, MD;  Location: [facility];  Service: ENT;  Laterality: Right;  PAROTIDECTOMY MASS  . SKIN CANCER EXCISION  2013   left shoulder  . TRANSTHORACIC ECHOCARDIOGRAM  06/26/2011   EF 55-60%    FAMILY HISTORY: Family History  Problem Relation Age of Onset  . Heart attack Father   . Heart disease    . Arthritis    . Colon cancer Neg Hx   . Liver disease Neg Hx     SOCIAL HISTORY:  Social History   Social History  . Marital status: Married    Spouse name: N/A  . Number  of children: 1  . Years of education: Bachelors   Occupational History  . Disabled    Social History Main Topics  . Smoking status: Never Smoker  . Smokeless tobacco: Never Used  . Alcohol use No  . Drug use: No  . Sexual activity: Yes   Other Topics Concern  . Not on file   Social History Narrative   Lives at [facility] home in  [place].   Right-handed.   No caffeine use.        PHYSICAL EXAM   Vitals:   06/08/16 1522  BP: (!) 148/92  Pulse: 74    Not recorded      There is no height or weight on file to calculate BMI.  PHYSICAL EXAMNIATION:  Gen: NAD, conversant, well nourised, obese, well groomed                     Cardiovascular: Regular rate rhythm, no peripheral edema, warm, nontender. Eyes: Conjunctivae clear without exudates or hemorrhage Neck: Supple, no carotid bruise. Pulmonary: Clear to auscultation bilaterally   NEUROLOGICAL EXAM:  MENTAL STATUS: Speech:    Speech is normal; fluent and spontaneous with normal comprehension.  Cognition: Mini-Mental Status Examination 22/28     Orientation: He is oriented to time and place     recent and remote memory: He missed 2/3 recalls     Attention span and concentration: He has difficulties world backwards     Normal Language, naming, repeating,spontaneous speech     Fund of knowledge   CRANIAL NERVES: CN II: Visual fields are full to confrontation. Fundoscopic exam is normal with sharp discs and no vascular changes. Pupils are round equal and briskly reactive to light. CN III, IV, VI: extraocular movement are normal. No ptosis. CN V: Facial sensation is intact to pinprick in all 3 divisions bilaterally. Corneal responses are intact.  CN VII: Face is symmetric with normal eye closure and smile. CN VIII:  Hearing is normal to rubbing fingers CN IX, X: Palate elevates symmetrically. Phonation is normal. CN XI: Head turning and shoulder shrug are intact CN XII: Tongue is midline with normal  movements and no atrophy.  MOTOR: He sees in wheelchair, mild face, significant limb and nuchal rigidity, bradykinesia, increased with reinforcement maneuver no significant weakness noticed  REFLEXES: Reflexes are 2+ and symmetric at the biceps, triceps, knees, and ankles. Plantar responses are flexor.  SENSORY: Preserved toe vibratory sensation and pinprick,  COORDINATION: There is no dysmetria on finger-to-nose and heel-knee-shin.    GAIT/STANCE: Deferred   DIAGNOSTIC DATA (LABS, IMAGING, TESTING) - I reviewed patient records, labs, notes, testing and imaging myself where available.   ASSESSMENT AND PLAN  LAIKEN NOHR is a 70 y.o. male   Parkinson's disease  Keep current dose of Sinemet 25/100 mg 2 tablets 3 times a day  Previously tried Sinemet 25/100 mg up to 2 tablets 5 times a day without improvement  Dementia  Mini-Mental Status Examination 22/28  Continue Aricept 5 mg daily Epilepsy:  On Keppra 250 mg twice a day, last a seizure was in 2013  Gait abnormality  Multifactorial, due to his Parkinson's disease, deconditioning, morbid obesity     Marcial Pacas, M.D. Ph.D.  [facility] [address] [place], [place] [address] Ph: [phone] Fax: [phone]  CC: Hilbert Corrigan, MD

## 2016-06-09 ENCOUNTER — Encounter: Payer: Self-pay | Admitting: Vascular Surgery

## 2016-06-09 ENCOUNTER — Other Ambulatory Visit: Payer: Self-pay | Admitting: Vascular Surgery

## 2016-06-09 DIAGNOSIS — G459 Transient cerebral ischemic attack, unspecified: Secondary | ICD-10-CM

## 2016-06-09 DIAGNOSIS — M7989 Other specified soft tissue disorders: Secondary | ICD-10-CM

## 2016-06-15 ENCOUNTER — Ambulatory Visit (HOSPITAL_COMMUNITY)
Admission: RE | Admit: 2016-06-15 | Discharge: 2016-06-15 | Disposition: A | Discharge status: Home / Self Care | Payer: Medicare HMO | Source: Ambulatory Visit | Attending: Vascular Surgery | Admitting: Vascular Surgery

## 2016-06-15 ENCOUNTER — Other Ambulatory Visit: Payer: Self-pay | Admitting: Vascular Surgery

## 2016-06-15 ENCOUNTER — Ambulatory Visit (INDEPENDENT_AMBULATORY_CARE_PROVIDER_SITE_OTHER): Payer: Medicare HMO | Admitting: Vascular Surgery

## 2016-06-15 ENCOUNTER — Encounter: Payer: Self-pay | Admitting: Vascular Surgery

## 2016-06-15 VITALS — BP 152/86 | HR 69 | Temp 97.7°F | Resp 20 | Ht 74.0 in | Wt 275.0 lb

## 2016-06-15 DIAGNOSIS — G459 Transient cerebral ischemic attack, unspecified: Secondary | ICD-10-CM | POA: Diagnosis present

## 2016-06-15 DIAGNOSIS — I6523 Occlusion and stenosis of bilateral carotid arteries: Secondary | ICD-10-CM | POA: Diagnosis not present

## 2016-06-15 DIAGNOSIS — G20 Parkinson's disease: Secondary | ICD-10-CM | POA: Diagnosis not present

## 2016-06-15 DIAGNOSIS — M7989 Other specified soft tissue disorders: Secondary | ICD-10-CM

## 2016-06-15 LAB — VAS US CAROTID
LCCADSYS: 64 cm/s
LCCAPDIAS: 21 cm/s
LCCAPSYS: 144 cm/s
LEFT ECA DIAS: -13 cm/s
LICADDIAS: -12 cm/s
LICAPDIAS: 16 cm/s
Left CCA dist dias: 11 cm/s
Left ICA dist sys: -41 cm/s
Left ICA prox sys: 83 cm/s
RCCAPDIAS: 19 cm/s
RIGHT CCA MID DIAS: 18 cm/s
RIGHT ECA DIAS: -14 cm/s
Right CCA prox sys: 92 cm/s
Right cca dist sys: -85 cm/s

## 2016-06-15 NOTE — Progress Notes (Signed)
Referred by:  Hilbert Corrigan, MD [address] [place], [place] [address]   Reason for referral: reported L carotid clot    History of Present Illness  Mark [name] is a 70 y.o. (18-Jul-1946) male w/ parkinson's disease who presents with cc: L arm swelling and clot in L carotid.  This patient notes onset of L arm swelling sometime over the last few weeks.  He denies any trauma to the left arm.  He denies any PPM or CVC placement in L central veins.   In regards to his carotid, reportedly he had a "clot" on a mobile non-invasive testing.  The patient denies any new CVA of TIA sx.  His atherosclerotic risk factors include: HTN, HLD  Past Medical History:  Diagnosis Date  . Arthritis   . Bacteremia due to Escherichia coli 06/21/2012  . Cervical vertebral fracture ([facility])   . Coronary artery disease    SEVERE 3-VESSEL DX WITH NORMAL EF  . Fall   . Fracture of right olecranon process 10/23/2011  . Gout   . Hypertension   . Kidney stones   . MI (myocardial infarction) 08/2011   /H&P (05/12/2012)  . Non-ST elevated myocardial infarction (non-STEMI) ([facility])   . PAF (paroxysmal atrial fibrillation) ([facility])   . Parkinson's disease   . 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 Adenoma.  . Pyelonephritis, acute 06/23/2012  . Seizures ([facility]) 12/10/2011   HAD SEIZURE  . Skin cancer    left shoulder  . Sleep apnea   . Stroke ([facility]) 10/12   Hx of remote stroke, RT SIDED WEAKNESS    Past Surgical History:  Procedure Laterality Date  . CARDIAC CATHETERIZATION  06/26/2011   NORMAL LV SYSTOLIC FUNCTION. RCA IS LARGE AND DOMINANT  . CORONARY ANGIOPLASTY WITH STENT PLACEMENT  ~ 2000   "one that I know of"  . HARDWARE REMOVAL  12/29/2011   Procedure: HARDWARE REMOVAL;  Surgeon: Johnny Bridge, MD;  Location: [facility];  Service: Orthopedics;  Laterality: Right;  . ORIF ELBOW FRACTURE  12/29/2011   Procedure: OPEN REDUCTION INTERNAL FIXATION (ORIF) ELBOW/OLECRANON FRACTURE;  Surgeon: Johnny Bridge, MD;   Location: [facility];  Service: Orthopedics;  Laterality: Right;  . PAROTIDECTOMY  05/12/2012   right  . PAROTIDECTOMY  05/12/2012   Procedure: PAROTIDECTOMY;  Surgeon: Rozetta Nunnery, MD;  Location: [facility];  Service: ENT;  Laterality: Right;  PAROTIDECTOMY MASS  . SKIN CANCER EXCISION  2013   left shoulder  . TRANSTHORACIC ECHOCARDIOGRAM  06/26/2011   EF 55-60%    Social History   Social History  . Marital status: Married    Spouse name: N/A  . Number of children: 1  . Years of education: Bachelors   Occupational History  . Disabled    Social History Main Topics  . Smoking status: Never Smoker  . Smokeless tobacco: Never Used  . Alcohol use No  . Drug use: No  . Sexual activity: Yes   Other Topics Concern  . Not on file   Social History Narrative   Lives at [facility] home in [place].   Right-handed.   No caffeine use.       Family History  Problem Relation Age of Onset  . Heart attack Father   . Heart disease    . Arthritis    . Colon cancer Neg Hx   . Liver disease Neg Hx     Current Outpatient Prescriptions  Medication Sig Dispense Refill  .  apixaban (ELIQUIS) 5 MG TABS tablet Take 5 mg by mouth 2 (two) times daily.    Marland Kitchen atorvastatin (LIPITOR) 40 MG tablet Take 40 mg by mouth daily.    . carbidopa-levodopa (SINEMET IR) 25-100 MG tablet Take by mouth 3 (three) times daily.    . carvedilol (COREG) 3.125 MG tablet Take 1 tablet (3.125 mg total) by mouth 2 (two) times daily with a meal. 60 tablet 6  . Cholecalciferol (VITAMIN D) 2000 UNITS tablet Take 2,000 Units by mouth daily.    . cloNIDine (CATAPRES - DOSED IN MG/24 HR) 0.1 mg/24hr patch Place 0.1 mg onto the skin once a week.    . cloNIDine (CATAPRES) 0.1 MG tablet Take 0.1 mg by mouth as needed. Give if BP is greater than 160/90.    Marland Kitchen colchicine 0.6 MG tablet Take 0.6 mg by mouth daily.    . diphenhydrAMINE (BENADRYL) 25 MG tablet Take 25 mg by mouth every 8 (eight) hours as needed.    . donepezil  (ARICEPT) 5 MG tablet Take 5 mg by mouth at bedtime.    . famotidine (PEPCID) 20 MG tablet Take 20 mg by mouth as needed for heartburn or indigestion.    . ferrous sulfate 325 (65 FE) MG tablet Take 325 mg by mouth daily.    . furosemide (LASIX) 20 MG tablet Take 20 mg by mouth daily.     Marland Kitchen guaiFENesin (MUCINEX) 600 MG 12 hr tablet Take 600 mg by mouth every 12 (twelve) hours as needed (congestion).    Marland Kitchen guaifenesin (ROBITUSSIN) 100 MG/5ML syrup Take 200 mg by mouth as needed for cough. Take 24ml q6h as needed.    Marland Kitchen ipratropium-albuterol (DUONEB) 0.5-2.5 (3) MG/3ML SOLN Take 3 mLs by nebulization every 6 (six) hours as needed (SHORTNESS OF BREATH).     Marland Kitchen levETIRAcetam (KEPPRA) 250 MG tablet Take 250 mg by mouth 2 (two) times daily.     . nitroGLYCERIN (NITROSTAT) 0.4 MG SL tablet Place 0.4 mg under the tongue every 5 (five) minutes as needed for chest pain.    Marland Kitchen ondansetron (ZOFRAN) 4 MG tablet Take 4 mg by mouth every 8 (eight) hours as needed for nausea or vomiting.    Marland Kitchen oxymetazoline (AFRIN) 0.05 % nasal spray Place 2 sprays into both nostrils as needed (nosebleeds).     . polyethylene glycol (MIRALAX / GLYCOLAX) packet Take 17 g by mouth daily.    Marland Kitchen Propylene Glycol (SYSTANE BALANCE) 0.6 % SOLN Apply 2 drops to eye at bedtime as needed (DRY EYES).    . QUEtiapine (SEROQUEL) 25 MG tablet Take 50 mg by mouth at bedtime.    . senna-docusate (SENOKOT-S) 8.6-50 MG per tablet Take 2 tablets by mouth 2 (two) times daily.     . sertraline (ZOLOFT) 25 MG tablet Take 75 mg by mouth daily.     . traMADol (ULTRAM) 50 MG tablet Take 50 mg by mouth 3 (three) times daily.     . vitamin B-12 (CYANOCOBALAMIN) 100 MCG tablet Take 100 mcg by mouth daily.    . vitamin C (ASCORBIC ACID) 500 MG tablet Take 500 mg by mouth 2 (two) times daily.     No current facility-administered medications for this visit.     Allergies  Allergen Reactions  . Rocephin [Ceftriaxone Sodium In Dextrose] Other (See Comments)     Unknown reaction  . Ciprofloxacin Rash     REVIEW OF SYSTEMS:   Cardiac:  positive for: no symptoms, negative for: Chest pain  or chest pressure, Shortness of breath upon exertion and Shortness of breath when lying flat,   Vascular:  positive for: Left arm swelling,  negative for: Pain in calf, thigh, or hip brought on by ambulation, Pain in feet at night that wakes you up from your sleep, Blood clot in your veins and Leg swelling  Pulmonary:  positive for: no symptoms,  negative for: Oxygen at home, Productive cough and Wheezing  Neurologic:  positive for: PD, negative for: Sudden weakness in arms or legs, Sudden numbness in arms or legs, Sudden onset of difficulty speaking or slurred speech, Temporary loss of vision in one eye and Problems with dizziness  Gastrointestinal:  positive for: no symptoms, negative for: Blood in stool and Vomited blood  Genitourinary:  positive for: no symptoms, negative for: Burning when urinating and Blood in urine  Psychiatric:  positive for: no symptoms,  negative for: Major depression  Hematologic:  positive for: no symptoms,  negative for: negative for: Bleeding problems and Problems with blood clotting too easily  Dermatologic:  positive for: no symptoms, negative for: Rashes or ulcers  Constitutional:  positive for: no symptoms, negative for: Fever or chills  Ear/Nose/Throat:  positive for: no symptoms, negative for: Change in hearing, Nose bleeds and Sore throat  Musculoskeletal:  positive for: no symptoms, negative for: Back pain, Joint pain and Muscle pain   Physical Examination  Vitals:   06/15/16 1450 06/15/16 1454  BP: (!) 154/91 (!) 152/86  Pulse: 69   Resp: 20   Temp: 97.7 F (36.5 C)   TempSrc: Oral   SpO2: 96%   Weight: 275 lb (124.7 kg)   Height: 6\' 2"  (1.88 m)     Body mass index is 35.31 kg/m.  General: Alert, O x 3, WD,fixed facies  Head: [place]/AT,   Ear/Nose/Throat: Hearing grossly intact,  nares without erythema or drainage, oropharynx without Erythema or Exudate , Mallampati score: 3, Dentition intact  Eyes: PERRLA, EOMI,   Neck: Supple, mid-line trachea,    Pulmonary: Sym exp, good B air movt,CTA B, chest vein somewhat more prominent  Cardiac: Irregularly, irregular rate and rhythm, no Murmurs, No rubs, No S3,S4  Vascular: Vessel Right Left  Radial Palpable Palpable  Brachial Palpable Palpable  Carotid Palpable, No Bruit Palpable, No Bruit  Aorta Not palpable N/A  Femoral Not palpable due to positioning in wheelchair Not palpable due to positioning in wheelchair  Popliteal Not palpable Not palpable  PT Not palpable Not palpable  DP Faintly palpable Faintly palpable   Gastrointestinal: soft, non-distended, non-tender to palpation, No guarding or rebound, no HSM, no masses, no CVAT B, No palpable prominent aortic pulse due to pannus  Musculoskeletal: M/S not tested due to wheelchair bound and limitation related to Parkinson, Extremities without ischemic changes , Edema in B legs, no varicosities, No LDS present, L arm swollen 1+ with some blistering on back of Left hand  Neurologic: psychomotor delay, limited exam due to Parkinson, Pain and light touch intact in extremities, Motor exam as listed above  Psychiatric: Judgement intact, Mood & affect appropriate for pt's clinical situation  Dermatologic: See M/S exam for extremity exam  Lymph : Palpable lymph nodes:    Non-Invasive Vascular Imaging  Carotid Duplex (Date: 06/15/2016):   R ICA stenosis: 1-39%  R VA: patent and antegrade  L ICA stenosis: 1-39%  L VA: patent and antegrade  L venous duplex (06/15/2016) No DVT or SVT   Outside Studies/Documentation 10 pages of outside documents were reviewed including:  outpatient nursing home chart, outpatient PCP chart.   Medical Decision Making  Mark [name] is a 70 y.o. 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  I doubt any advantage to venography on that arm, as usually with time his body will hypertrophy chest and neck collaterals to drain the left arm.  In regards to the outside non-invasive studies, I have found the for-profit screening packages usually are inadequate and incorrect, so it's not surprising that the patient had no thrombus in either carotid artery.  Thank you for allowing [place] to participate in this patient's care.   Adele Barthel, MD, FACS [facility] Office: [phone] Pager: [phone]  06/15/2016, 3:39 PM

## 2016-10-04 ENCOUNTER — Encounter (HOSPITAL_COMMUNITY): Payer: Self-pay | Admitting: Emergency Medicine

## 2016-12-10 ENCOUNTER — Encounter (INDEPENDENT_AMBULATORY_CARE_PROVIDER_SITE_OTHER): Payer: Self-pay

## 2016-12-10 ENCOUNTER — Ambulatory Visit (INDEPENDENT_AMBULATORY_CARE_PROVIDER_SITE_OTHER): Payer: Medicare HMO | Admitting: Neurology

## 2016-12-10 ENCOUNTER — Encounter: Payer: Self-pay | Admitting: Neurology

## 2016-12-10 VITALS — BP 106/65 | HR 84

## 2016-12-10 DIAGNOSIS — G20 Parkinson's disease: Secondary | ICD-10-CM | POA: Diagnosis not present

## 2016-12-10 DIAGNOSIS — F29 Unspecified psychosis not due to a substance or known physiological condition: Secondary | ICD-10-CM | POA: Diagnosis not present

## 2016-12-10 DIAGNOSIS — G40909 Epilepsy, unspecified, not intractable, without status epilepticus: Secondary | ICD-10-CM

## 2016-12-10 NOTE — Progress Notes (Signed)
  PATIENT: Mark [name] DOB: 11/24/1945  Chief Complaint  Patient presents with  . Parksinson's Disease    MMSE 20/30 - 6 animals.  He resides in [facility] nursing [facility] and is here today with his wife, Mark [name].  She feels there has been no change in his condition.  . Seizures    No seizure activity reported.     HISTORICAL  Mark [name] is a 71 years old right-handed male, accompanied by his wife Mark [name], seen in refer by his primary care doctor Fernando Enrique Ariza for evaluation of Parkinson's disease, initial evaluation was on June 08 2016.  He came in with wheelchair today, he is currently a resident of [facility] [place] since 2013 Reviewed and summarized the referring note, he had a history of atrial fibrillation on chronic anticoagulation Eliquis, seizure, coronary artery disease, status post stent in 2012, hypertension, history of stroke, memory loss, sleep apnea, hyperlipidemia,  He was diagnosed with Parkinson's disease since 2007, initial symptoms was bradykinesia, difficulty to get in and out of shower, later noted shuffling gait, was treated by Dr. Willis before.  Then he was treated by Dr. Dooqua over the past few years.  He was treated with sinemet for many years, now taking Sinemet 25/100 2 tabs tid,  for a while he was taking high dose of Sinemet 25/100 mg up to 2 tablets 5 times a day, even at that dosage, he could not walk, could not stand up even with that dose.  He had sepsis from UTI in 2013, was discharged to nursing home since then, at that time he become wheelchair dependent, also totally dependent on his daily activity, shower, bathing, toileting, feeding, brushing his teeth. He remained in that level of care since.  He has frequent UTI, chronic constipation.  He has no incontinence.  He also had worsening memory loss, he is a retired detective at age 55 years old, he also had chronic low back pain, he still play cards, bingo, he is easily agitated, he sleeps  well, he has delusional idea sometimes, very sensitive to sound,  He is on polypharmacy treatment, zoloft 25mg qday, seroquel 25mg 2 tabs qhs, aricept 5mg qhs, sinemet 25/100 2 tabs tid.   Overall, he had slowly decline over the past few years, but no dramatic changes, he needs more help to get in and out of his wheelchair, needing lift to transfer. He has recent dysphagia, was seen by speech pathologist, improved with neck flexion technique  Laboratory evaluation in September 2017, elevated creatinine 1.38, glucose 105, hemoglobin 13 point 5, ESR 18,  He had history of seizure since 2007, he is now taking keppra 250mg bid, last seizure was around 2013.  UPDATE December 10 2016: He has no recurrent seizure, still taking Keppra 250 mg twice a day, no significant change compared to last visit in October 2017,  Is not on polypharmacy treatment, seroquel 25 mg 2 tablets every night, Ativan 0.5 milligrams every 8 hours,   I reviewed his record, he has chronic pain, bilateral lower extremity and bilateral hand swelling, renal insufficiency, hypertension, she is no longer ambulatory, has not been walked for few years, paroxysmal atrial fibrillation Eliquis,    He recently had right ear area skin cancer surgery, he wears diaper, need assistance on all daily activities including feeding,  REVIEW OF SYSTEMS: Full 14 system review of systems performed and notable only for activity change, appetite change, fatigue, cough, wheezing, leg swelling, constipation, restless leg, joint pain, back   pain, achy muscles, bruise easily, depression, hallucinations  ALLERGIES: Allergies  Allergen Reactions  . Rocephin [Ceftriaxone Sodium In Dextrose] Other (See Comments)    Unknown reaction  . Ciprofloxacin Rash    HOME MEDICATIONS: Current Outpatient Prescriptions  Medication Sig Dispense Refill  . apixaban (ELIQUIS) 5 MG TABS tablet Take 5 mg by mouth 2 (two) times daily.    . atorvastatin (LIPITOR) 40 MG tablet  Take 40 mg by mouth daily.    . carbidopa-levodopa (SINEMET IR) 25-100 MG tablet Take by mouth 3 (three) times daily.    . carvedilol (COREG) 3.125 MG tablet Take 1 tablet (3.125 mg total) by mouth 2 (two) times daily with a meal. 60 tablet 6  . Cholecalciferol (VITAMIN D) 2000 UNITS tablet Take 2,000 Units by mouth daily.    . cloNIDine (CATAPRES - DOSED IN MG/24 HR) 0.1 mg/24hr patch Place 0.1 mg onto the skin once a week.    . cloNIDine (CATAPRES) 0.1 MG tablet Take 0.1 mg by mouth as needed. Give if BP is greater than 160/90.    . colchicine 0.6 MG tablet Take 0.6 mg by mouth daily.    . diphenhydrAMINE (BENADRYL) 25 MG tablet Take 25 mg by mouth every 8 (eight) hours as needed.    . donepezil (ARICEPT) 5 MG tablet Take 5 mg by mouth at bedtime.    . famotidine (PEPCID) 20 MG tablet Take 20 mg by mouth as needed for heartburn or indigestion.    . ferrous sulfate 325 (65 FE) MG tablet Take 325 mg by mouth daily.    . furosemide (LASIX) 20 MG tablet Take 20 mg by mouth daily.     . guaiFENesin (MUCINEX) 600 MG 12 hr tablet Take 600 mg by mouth every 12 (twelve) hours as needed (congestion).    . guaifenesin (ROBITUSSIN) 100 MG/5ML syrup Take 200 mg by mouth as needed for cough. Take 10ml q6h as needed.    . ipratropium-albuterol (DUONEB) 0.5-2.5 (3) MG/3ML SOLN Take 3 mLs by nebulization every 6 (six) hours as needed (SHORTNESS OF BREATH).     . levETIRAcetam (KEPPRA) 250 MG tablet Take 250 mg by mouth 2 (two) times daily.     . lisinopril (PRINIVIL,ZESTRIL) 5 MG tablet Take 5 mg by mouth daily.    . LORazepam (ATIVAN) 0.5 MG tablet Take 0.5 mg by mouth every 8 (eight) hours.    . nitroGLYCERIN (NITROSTAT) 0.4 MG SL tablet Place 0.4 mg under the tongue every 5 (five) minutes as needed for chest pain.    . ondansetron (ZOFRAN) 4 MG tablet Take 4 mg by mouth every 8 (eight) hours as needed for nausea or vomiting.    . oxyCODONE (OXY IR/ROXICODONE) 5 MG immediate release tablet Take 5 mg by mouth  every 8 (eight) hours.    . oxymetazoline (AFRIN) 0.05 % nasal spray Place 2 sprays into both nostrils as needed (nosebleeds).     . polyethylene glycol (MIRALAX / GLYCOLAX) packet Take 17 g by mouth daily.    . Propylene Glycol (SYSTANE BALANCE) 0.6 % SOLN Apply 2 drops to eye at bedtime as needed (DRY EYES).    . QUEtiapine (SEROQUEL) 25 MG tablet Take 50 mg by mouth at bedtime.    . senna-docusate (SENOKOT-S) 8.6-50 MG per tablet Take 2 tablets by mouth 2 (two) times daily.     . sertraline (ZOLOFT) 25 MG tablet Take 100 mg by mouth daily.     . vitamin B-12 (CYANOCOBALAMIN) 100 MCG tablet Take   100 mcg by mouth daily.    . vitamin C (ASCORBIC ACID) 500 MG tablet Take 500 mg by mouth 2 (two) times daily.     No current facility-administered medications for this visit.     PAST MEDICAL HISTORY: Past Medical History:  Diagnosis Date  . Arthritis   . Bacteremia due to Escherichia coli 06/21/2012  . Cervical vertebral fracture ([place])   . Coronary artery disease    SEVERE 3-VESSEL DX WITH NORMAL EF  . Fall   . Fracture of right olecranon process 10/23/2011  . Gout   . Hypertension   . Kidney stones   . MI (myocardial infarction) 08/2011   /H&P (05/12/2012)  . Non-ST elevated myocardial infarction (non-STEMI) ([place])   . PAF (paroxysmal atrial fibrillation) ([place])   . Parkinson's disease   . 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 Adenoma.  . Pyelonephritis, acute 06/23/2012  . Seizures ([place]) 12/10/2011   HAD SEIZURE  . Skin cancer    left shoulder  . Sleep apnea   . Stroke ([place]) 10/12   Hx of remote stroke, RT SIDED WEAKNESS    PAST SURGICAL HISTORY: Past Surgical History:  Procedure Laterality Date  . CARDIAC CATHETERIZATION  06/26/2011   NORMAL LV SYSTOLIC FUNCTION. RCA IS LARGE AND DOMINANT  . CORONARY ANGIOPLASTY WITH STENT PLACEMENT  ~ 2000   "one that I know of"  . HARDWARE REMOVAL  12/29/2011   Procedure: HARDWARE REMOVAL;  Surgeon: Johnny Bridge, MD;  Location: [facility];  Service:  Orthopedics;  Laterality: Right;  . ORIF ELBOW FRACTURE  12/29/2011   Procedure: OPEN REDUCTION INTERNAL FIXATION (ORIF) ELBOW/OLECRANON FRACTURE;  Surgeon: Johnny Bridge, MD;  Location: [facility];  Service: Orthopedics;  Laterality: Right;  . PAROTIDECTOMY  05/12/2012   right  . PAROTIDECTOMY  05/12/2012   Procedure: PAROTIDECTOMY;  Surgeon: Rozetta Nunnery, MD;  Location: [facility];  Service: ENT;  Laterality: Right;  PAROTIDECTOMY MASS  . SKIN CANCER EXCISION  2013   left shoulder  . SKIN CANCER EXCISION     right ear  . TRANSTHORACIC ECHOCARDIOGRAM  06/26/2011   EF 55-60%    FAMILY HISTORY: Family History  Problem Relation Age of Onset  . Heart attack Father   . Heart disease    . Arthritis    . Colon cancer Neg Hx   . Liver disease Neg Hx     SOCIAL HISTORY:  Social History   Social History  . Marital status: Married    Spouse name: N/A  . Number of children: 1  . Years of education: Bachelors   Occupational History  . Disabled    Social History Main Topics  . Smoking status: Never Smoker  . Smokeless tobacco: Never Used  . Alcohol use No  . Drug use: No  . Sexual activity: Yes   Other Topics Concern  . Not on file   Social History Narrative   Lives at [facility] home in [place].   Right-handed.   No caffeine use.        PHYSICAL EXAM   Vitals:   12/10/16 1431  BP: 106/65  Pulse: 84    Not recorded      There is no height or weight on file to calculate BMI.  PHYSICAL EXAMNIATION:  Gen: NAD, conversant, well nourised, obese, well groomed                     Cardiovascular: Regular rate rhythm, no peripheral edema,  warm, nontender. Eyes: Conjunctivae clear without exudates or hemorrhage Neck: Supple, no carotid bruise. Pulmonary: Clear to auscultation bilaterally   NEUROLOGICAL EXAM:  MENTAL STATUS: Speech:    Speech is normal; fluent and spontaneous with normal comprehension.  Cognition: Mini-Mental Status Examination 22/28      Orientation: He is oriented to time and place     recent and remote memory: He missed 2/3 recalls     Attention span and concentration: He has difficulties world backwards     Normal Language, naming, repeating,spontaneous speech     Fund of knowledge   CRANIAL NERVES: CN II: Visual fields are full to confrontation. Fundoscopic exam is normal with sharp discs and no vascular changes. Pupils are round equal and briskly reactive to light. CN III, IV, VI: extraocular movement are normal. No ptosis. CN V: Facial sensation is intact to pinprick in all 3 divisions bilaterally. Corneal responses are intact.  CN VII: Face is symmetric with normal eye closure and smile. CN VIII: Hearing is normal to rubbing fingers CN IX, X: Palate elevates symmetrically. Phonation is normal. CN XI: Head turning and shoulder shrug are intact CN XII: Tongue is midline with normal movements and no atrophy.  MOTOR: He sit in wheelchair, masked face, significant limb and nuchal rigidity, bradykinesia, increased with reinforcement maneuver no significant weakness noticed, lack of spontaneous activity, complains of shoulder pain with passive movement  REFLEXES: Reflexes are 2+ and symmetric at the biceps, triceps, knees, and ankles. Plantar responses are flexor.  SENSORY: Preserved toe vibratory sensation and pinprick,  COORDINATION: There is no dysmetria on finger-to-nose and heel-knee-shin.    GAIT/STANCE: Deferred   DIAGNOSTIC DATA (LABS, IMAGING, TESTING) - I reviewed patient records, labs, notes, testing and imaging myself where available.   ASSESSMENT AND PLAN  Mako S Siler is a 70 y.o. male   Parkinson's disease  Keep current dose of Sinemet 25/100 mg 2 tablets 3 times a day  Dementia  Mini-Mental Status Examination 20/28  Continue Aricept 5 mg daily Epilepsy:  On Keppra 250 mg twice a day, last seizure was in 2013  Gait abnormality  Multifactorial, due to his Parkinson's disease,  deconditioning, morbid obesity      , M.D. Ph.D.  [facility] [address], [address] [place], [place] [address] Ph: [phone] Fax: [phone]  CC: Fernando Enrique Ariza, MD 

## 2017-05-25 ENCOUNTER — Telehealth: Payer: Self-pay | Admitting: Neurology

## 2017-05-25 NOTE — Telephone Encounter (Signed)
Mark [name] with [facility] called office to schedule a follow up visit with Dr. Krista Blue per Dr at facility for [facility].  Next available is scheduling into December which is to far out. Wife is requesting to see Dr. Krista Blue.  Are we able to work patient in sooner?  Please call

## 2017-05-25 NOTE — Telephone Encounter (Signed)
Patient has been scheduled with Dr. Krista Blue 06/17/17 @ 9:00am check in @ 8:30am.

## 2017-06-17 ENCOUNTER — Ambulatory Visit: Payer: Medicare HMO | Admitting: Neurology

## 2017-07-22 ENCOUNTER — Ambulatory Visit: Payer: Medicare HMO | Admitting: Neurology

## 2017-10-11 ENCOUNTER — Ambulatory Visit (INDEPENDENT_AMBULATORY_CARE_PROVIDER_SITE_OTHER): Payer: Medicare HMO | Admitting: Neurology

## 2017-10-11 ENCOUNTER — Encounter (INDEPENDENT_AMBULATORY_CARE_PROVIDER_SITE_OTHER): Payer: Self-pay

## 2017-10-11 ENCOUNTER — Encounter: Payer: Self-pay | Admitting: Neurology

## 2017-10-11 VITALS — BP 99/62 | HR 71

## 2017-10-11 DIAGNOSIS — I480 Paroxysmal atrial fibrillation: Secondary | ICD-10-CM

## 2017-10-11 DIAGNOSIS — G40909 Epilepsy, unspecified, not intractable, without status epilepticus: Secondary | ICD-10-CM | POA: Diagnosis not present

## 2017-10-11 DIAGNOSIS — G20 Parkinson's disease: Secondary | ICD-10-CM | POA: Diagnosis not present

## 2017-10-11 DIAGNOSIS — R443 Hallucinations, unspecified: Secondary | ICD-10-CM

## 2017-10-11 MED ORDER — DIVALPROEX SODIUM 500 MG PO DR TAB: 500.0000 mg | DELAYED_RELEASE_TABLET | Freq: Two times a day (BID) | ORAL | 6 refills | Status: DC

## 2017-10-11 NOTE — Progress Notes (Signed)
PATIENT: Mark [name] DOB: 09/05/1946  Chief Complaint  Patient presents with  . Seizures    PCP: Dr. Caprice [name].Patient is not sleeping well, no appetite.    HISTORICAL  Mark [name] is a 72 years old right-handed male, accompanied by his wife Mark [name], seen in refer by his primary care doctor Mark [name] for evaluation of Parkinson's disease, initial evaluation was on June 08 2016.  He came in with wheelchair today, he is currently a resident of [facility] since 2013 Reviewed and summarized the referring note, he had a history of atrial fibrillation on chronic anticoagulation Eliquis, seizure, coronary artery disease, status post stent in 2012, hypertension, history of stroke, memory loss, sleep apnea, hyperlipidemia,  He was diagnosed with Parkinson's disease since 2007, initial symptoms was bradykinesia, difficulty to get in and out of shower, later noted shuffling gait, was treated by Dr. Jannifer Franklin before.  Then he was treated by Dr. Janann Colonel over the past few years.  He was treated with sinemet for many years, now taking Sinemet 25/100 2 tabs tid,  for a while he was taking high dose of Sinemet 25/100 mg up to 2 tablets 5 times a day, even at that dosage, he could not walk, could not stand up even with that dose.  He had sepsis from UTI in 2013, was discharged to nursing home since then, at that time he become wheelchair dependent, also totally dependent on his daily activity, shower, bathing, toileting, feeding, brushing his teeth. He remained in that level of care since.  He has frequent UTI, chronic constipation.  He has no incontinence.  He also had worsening memory loss, he is a retired Tax adviser at age 45 years old, he also had chronic low back pain, he still play cards, bingo, he is easily agitated, he sleeps well, he has delusional idea sometimes, very sensitive to sound,  He is on polypharmacy treatment, zoloft 76m qday, seroquel 237m2 tabs qhs, aricept  48m19mhs, sinemet 25/100 2 tabs tid.   Overall, he had slowly decline over the past few years, but no dramatic changes, he needs more help to get in and out of his wheelchair, needing lift to transfer. He has recent dysphagia, was seen by speech pathologist, improved with neck flexion technique  Laboratory evaluation in September 2017, elevated creatinine 1.38, glucose 105, hemoglobin 13 point 5, ESR 18,  He had history of seizure since 2007, he is now taking keppra 250m36md, last seizure was around 2013.  UPDATE December 10 2016: He has no recurrent seizure, still taking Keppra 250 mg twice a day, no significant change compared to last visit in October 2017,  Is not on polypharmacy treatment, seroquel 25 mg 2 tablets every night, Ativan 0.5 milligrams every 8 hours,   I reviewed his record, he has chronic pain, bilateral lower extremity and bilateral hand swelling, renal insufficiency, hypertension, she is no longer ambulatory, has not been walked for few years, paroxysmal atrial fibrillation Eliquis,    He recently had right ear area skin cancer surgery, he wears diaper, need assistance on all daily activities including feeding,  UPDATE Oct 11 2017: He is accompanied by his wife at today's clinical visit, he sits in wheelchair, continued to decline gradually, tends to lean towards the left side, not talking much anymore, worsening memory loss, confused easily, can no longer walk, he is currently a resident at [facility] nursing [facility].  REVIEW OF SYSTEMS: Full 14 system review  of systems performed and notable only for trouble swallowing, cough, wheezing, shortness of breath, leg swelling, cold intolerance, flushing, constipation, frequent awakening, acting out of dreams, joint pain, back pain, achy muscles, muscle cramps, neck pain, neck stiffness, bruise easily, memory loss, speech difficulty, allergy depression, hallucinations   ALLERGIES: Allergies  Allergen  Reactions  . Rocephin [Ceftriaxone Sodium In Dextrose] Other (See Comments)    Unknown reaction  . Ciprofloxacin Rash    HOME MEDICATIONS: Current Outpatient Medications  Medication Sig Dispense Refill  . apixaban (ELIQUIS) 5 MG TABS tablet Take 5 mg by mouth 2 (two) times daily.    Marland Kitchen atorvastatin (LIPITOR) 40 MG tablet Take 40 mg by mouth daily.    . carbidopa-levodopa (SINEMET IR) 25-100 MG tablet Take by mouth 3 (three) times daily.    . carvedilol (COREG) 3.125 MG tablet Take 1 tablet (3.125 mg total) by mouth 2 (two) times daily with a meal. 60 tablet 6  . Cholecalciferol (VITAMIN D) 2000 UNITS tablet Take 2,000 Units by mouth daily.    . cloNIDine (CATAPRES - DOSED IN MG/24 HR) 0.1 mg/24hr patch Place 0.1 mg onto the skin once a week.    . cloNIDine (CATAPRES) 0.1 MG tablet Take 0.1 mg by mouth as needed. Give if BP is greater than 160/90.    Marland Kitchen colchicine 0.6 MG tablet Take 0.6 mg by mouth daily.    . diphenhydrAMINE (BENADRYL) 25 MG tablet Take 25 mg by mouth every 8 (eight) hours as needed.    . donepezil (ARICEPT) 5 MG tablet Take 5 mg by mouth at bedtime.    . famotidine (PEPCID) 20 MG tablet Take 20 mg by mouth as needed for heartburn or indigestion.    . ferrous sulfate 325 (65 FE) MG tablet Take 325 mg by mouth daily.    . furosemide (LASIX) 20 MG tablet Take 20 mg by mouth daily.     Marland Kitchen guaiFENesin (MUCINEX) 600 MG 12 hr tablet Take 600 mg by mouth every 12 (twelve) hours as needed (congestion).    Marland Kitchen guaifenesin (ROBITUSSIN) 100 MG/5ML syrup Take 200 mg by mouth as needed for cough. Take 43m q6h as needed.    .Marland Kitchenipratropium-albuterol (DUONEB) 0.5-2.5 (3) MG/3ML SOLN Take 3 mLs by nebulization every 6 (six) hours as needed (SHORTNESS OF BREATH).     .Marland KitchenlevETIRAcetam (KEPPRA) 250 MG tablet Take 250 mg by mouth 2 (two) times daily.     .Marland Kitchenlisinopril (PRINIVIL,ZESTRIL) 5 MG tablet Take 5 mg by mouth daily.    .Marland KitchenLORazepam (ATIVAN) 0.5 MG tablet Take 0.5 mg by mouth every 8 (eight)  hours.    . nitroGLYCERIN (NITROSTAT) 0.4 MG SL tablet Place 0.4 mg under the tongue every 5 (five) minutes as needed for chest pain.    .Marland Kitchenondansetron (ZOFRAN) 4 MG tablet Take 4 mg by mouth every 8 (eight) hours as needed for nausea or vomiting.    .Marland KitchenoxyCODONE (OXY IR/ROXICODONE) 5 MG immediate release tablet Take 5 mg by mouth every 8 (eight) hours.    .Marland Kitchenoxymetazoline (AFRIN) 0.05 % nasal spray Place 2 sprays into both nostrils as needed (nosebleeds).     . polyethylene glycol (MIRALAX / GLYCOLAX) packet Take 17 g by mouth daily.    .Marland KitchenPropylene Glycol (SYSTANE BALANCE) 0.6 % SOLN Apply 2 drops to eye at bedtime as needed (DRY EYES).    . QUEtiapine (SEROQUEL) 25 MG tablet Take 50 mg by mouth at bedtime.    . senna-docusate (SENOKOT-S)  8.6-50 MG per tablet Take 2 tablets by mouth 2 (two) times daily.     . sertraline (ZOLOFT) 25 MG tablet Take 100 mg by mouth daily.     . vitamin B-12 (CYANOCOBALAMIN) 100 MCG tablet Take 100 mcg by mouth daily.    . vitamin C (ASCORBIC ACID) 500 MG tablet Take 500 mg by mouth 2 (two) times daily.     No current facility-administered medications for this visit.     PAST MEDICAL HISTORY: Past Medical History:  Diagnosis Date  . Arthritis   . Bacteremia due to Escherichia coli 06/21/2012  . Cervical vertebral fracture ([place])   . Coronary artery disease    SEVERE 3-VESSEL DX WITH NORMAL EF  . Fall   . Fracture of right olecranon process 10/23/2011  . Gout   . Hypertension   . Kidney stones   . MI (myocardial infarction) ([place]) 08/2011   /H&P (05/12/2012)  . Non-ST elevated myocardial infarction (non-STEMI) ([place])   . PAF (paroxysmal atrial fibrillation) ([place])   . Parkinson's disease   . 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 Adenoma.  . Pyelonephritis, acute 06/23/2012  . Seizures ([place]) 12/10/2011   HAD SEIZURE  . Skin cancer    left shoulder  . Sleep apnea   . Stroke ([place]) 10/12   Hx of remote stroke, RT SIDED WEAKNESS    PAST SURGICAL HISTORY: Past Surgical  History:  Procedure Laterality Date  . CARDIAC CATHETERIZATION  06/26/2011   NORMAL LV SYSTOLIC FUNCTION. RCA IS LARGE AND DOMINANT  . CORONARY ANGIOPLASTY WITH STENT PLACEMENT  ~ 2000   "one that I know of"  . HARDWARE REMOVAL  12/29/2011   Procedure: HARDWARE REMOVAL;  Surgeon: Johnny Bridge, MD;  Location: [facility];  Service: Orthopedics;  Laterality: Right;  . ORIF ELBOW FRACTURE  12/29/2011   Procedure: OPEN REDUCTION INTERNAL FIXATION (ORIF) ELBOW/OLECRANON FRACTURE;  Surgeon: Johnny Bridge, MD;  Location: [facility];  Service: Orthopedics;  Laterality: Right;  . PAROTIDECTOMY  05/12/2012   right  . PAROTIDECTOMY  05/12/2012   Procedure: PAROTIDECTOMY;  Surgeon: Rozetta Nunnery, MD;  Location: [facility];  Service: ENT;  Laterality: Right;  PAROTIDECTOMY MASS  . SKIN CANCER EXCISION  2013   left shoulder  . SKIN CANCER EXCISION     right ear  . TRANSTHORACIC ECHOCARDIOGRAM  06/26/2011   EF 55-60%    FAMILY HISTORY: Family History  Problem Relation Age of Onset  . Heart attack Father   . Heart disease Unknown   . Arthritis Unknown   . Colon cancer Neg Hx   . Liver disease Neg Hx     SOCIAL HISTORY:  Social History   Socioeconomic History  . Marital status: Married    Spouse name: Not on file  . Number of children: 1  . Years of education: Bachelors  . Highest education level: Not on file  Social Needs  . Financial resource strain: Not on file  . Food insecurity - worry: Not on file  . Food insecurity - inability: Not on file  . Transportation needs - medical: Not on file  . Transportation needs - non-medical: Not on file  Occupational History  . Occupation: Disabled  Tobacco Use  . Smoking status: Never Smoker  . Smokeless tobacco: Never Used  Substance and Sexual Activity  . Alcohol use: No    Alcohol/week: 0.0 oz  . Drug use: No  . Sexual activity: Yes  Other Topics Concern  . Not on  file  Social History Narrative   Lives at [facility] in [place].    Right-handed.   No caffeine use.     PHYSICAL EXAM   Vitals:   10/11/17 1432  BP: 99/62  Pulse: 71    Not recorded      There is no height or weight on file to calculate BMI.  PHYSICAL EXAMNIATION:  Gen: NAD, conversant, well nourised, obese, well groomed                     Cardiovascular: Regular rate rhythm, no peripheral edema, warm, nontender. Eyes: Conjunctivae clear without exudates or hemorrhage Neck: Supple, no carotid bruits. Pulmonary: Clear to auscultation bilaterally   NEUROLOGICAL EXAM:  He sits in a wheelchair, dependent on his wife to answer questions,   CRANIAL NERVES: CN II: Visual fields are full to confrontation.  Pupils are round equal and briskly reactive to light. CN III, IV, VI: extraocular movement are normal. No ptosis. CN V: Facial sensation is intact to pinprick in all 3 divisions bilaterally. Corneal responses are intact.  CN VII: Face is symmetric with normal eye closure and smile. CN VIII: Hearing is normal to rubbing fingers CN IX, X: Palate elevates symmetrically. Phonation is normal. CN XI: Head turning and shoulder shrug are intact CN XII: Tongue is midline with normal movements and no atrophy.  MOTOR: He sit in wheelchair, masked face, significant limb and nuchal rigidity, bradykinesia, increased with reinforcement maneuver no significant weakness noticed, lack of spontaneous activity, complains of shoulder pain with passive movement   REFLEXES: Reflexes are hypoactive and symmetric at the biceps, triceps, knees, and ankles. Plantar responses are flexor.  SENSORY: Withdraw to pain.  COORDINATION: Normal finger to nose, heel to shin  GAIT/STANCE: Deferred  DIAGNOSTIC DATA (LABS, IMAGING, TESTING) - I reviewed patient records, labs, notes, testing and imaging myself where available.   ASSESSMENT AND PLAN  RAINE ELSASS is a 72 y.o. male    Parkinson's disease  Keep current dose of Sinemet 25/100 mg 2 tablets 3  times a day  Dementia with agitation  Continue Aricept 5 mg daily  Epilepsy:  Was on Keppra 250 mg twice a day, last seizure was in 2013  Because of his increased agitations, will change to Depakote DR 569m bid.  Gait abnormality  Multifactorial, due to his Parkinson's disease, deconditioning, morbid obesity  YMarcial Pacas M.D. Ph.D.  [facility] [address] [place] [id number]: [phone]: [phone] CC: BCaprice Renshaw MD

## 2017-10-14 ENCOUNTER — Encounter: Payer: Self-pay | Admitting: Neurology

## 2017-11-10 ENCOUNTER — Telehealth: Payer: Self-pay | Admitting: Neurology

## 2017-11-10 DIAGNOSIS — R410 Disorientation, unspecified: Secondary | ICD-10-CM

## 2017-11-10 NOTE — Telephone Encounter (Signed)
Per Dr. Krista Blue, patient had a CT head back in May 2017. She spoke with wife about this and possibly doing an MRI. It would visualize disease process better, but not change plan for pt. He would also need a manual lift for MRI. Dr. Krista Blue states pt is on correct medications. She would not recommend MRI at this time.

## 2017-11-10 NOTE — Telephone Encounter (Signed)
Patient's wife is calling. Patient is in a nursing home and the doctor at the nursing home states the patient should have an MRI to see how his Parkinson's has progressed. He says patient's neurologist would have to order. Can an order be put in? . Please call and advise.

## 2017-11-10 NOTE — Telephone Encounter (Signed)
I called pt's wife and explained that Dr. Krista Blue says that an MRI may visualize the disease process better but not change the plan. Pt would need a manual lift for the MRI and Dr. Krista Blue feels that the medications for him are correct. Dr. Krista Blue does not recommend an MRI at this time.  Pt's wife says that she still would like the MRI to better visualize the disease process. Pt has started having blank stares, can't hold his head up, has poor attention, memory loss, can't wake up, and these symptoms are not normal for the pt. I offered pt an appt to discuss this further with Dr. Krista Blue, but pt's wife refused. She reports that since starting the "new medication" last month, pt's has been different from his baseline, pt's wife is unsure of the medication (it may be depakote?). Pt's blood work was checked by the SNF and it was deemed that the medication was not the cause of these symptoms, and that is why the MD and NP at the SNF recommend that Dr. Krista Blue order an MRI.

## 2017-11-11 NOTE — Telephone Encounter (Signed)
I called pt's wife to discuss. No answer, no VM set up. Will try again later.

## 2017-11-11 NOTE — Telephone Encounter (Signed)
I called pt's wife, advised her that Dr. Krista Blue has ordered an MRI and EEG and our office will call her to schedule both of these procedures for this pt. Pt's wife verbalized understanding and appreciation.

## 2017-11-11 NOTE — Addendum Note (Signed)
Addended by: Marcial Pacas on: 11/11/2017 08:55 AM   Modules accepted: Orders

## 2017-11-11 NOTE — Telephone Encounter (Signed)
I ordered MRI brain and EEG.

## 2017-11-11 NOTE — Telephone Encounter (Signed)
The MRI is scheduled at [facility] since he needs a lift. He is scheduled for 11/19/17 he needs to arrive at 2:45 pm. I did leave a detail voicemail on his wife phone number. I also left a phone [facility] phone number of [phone] if some reason she needed to reschedule.

## 2017-11-12 NOTE — Telephone Encounter (Signed)
Romana at the front desk with the Nursing home wanting to know if EEG can be done at [facility], stating they like to keep the pts closer to home. Please call to advise at [phone]

## 2017-11-15 NOTE — Telephone Encounter (Signed)
Patient is scheduled For his EEG at [facility] 11/18/2017. Arrive at 1:45 pm . I spoke to tech to schedule. [phone]. I have also spoke to patient's wife and she is aware. Patient's wife is going to contact Nursing facility where patient is about his apt.

## 2017-11-17 ENCOUNTER — Ambulatory Visit (HOSPITAL_COMMUNITY)
Admission: RE | Admit: 2017-11-17 | Discharge: 2017-11-17 | Disposition: A | Discharge status: Home / Self Care | Payer: Medicare HMO | Source: Ambulatory Visit | Attending: Neurology | Admitting: Neurology

## 2017-11-17 DIAGNOSIS — G40909 Epilepsy, unspecified, not intractable, without status epilepticus: Secondary | ICD-10-CM

## 2017-11-17 DIAGNOSIS — Z79899 Other long term (current) drug therapy: Secondary | ICD-10-CM | POA: Insufficient documentation

## 2017-11-17 DIAGNOSIS — R569 Unspecified convulsions: Secondary | ICD-10-CM | POA: Diagnosis present

## 2017-11-17 NOTE — Procedures (Signed)
ELECTROENCEPHALOGRAM REPORT  Date of Study: 11/17/2017  Patient's Name: Mark [name] MRN: [id number] Date of Birth: [birth year]  Referring Provider: Dr. Marcial Pacas  Clinical History: This is a 72 year old man with seizures, cognitive decline  Medications: divalproex (DEPAKOTE) 500 MG DR tablet Take 1 tablet (500 mg total) by mouth 2 (two) times daily. apixaban (ELIQUIS) 5 MG TABS tablet Take 5 mg by mouth 2 (two) times daily.  atorvastatin (LIPITOR) 40 MG tablet Take 40 mg by mouth daily. carbidopa-levodopa (SINEMET IR) 25-100 MG tablet Take by mouth 3 (three) times daily. carvedilol (COREG) 3.125 MG tablet Take 1 tablet (3.125 mg total) by mouth 2 (two) times daily with a meal. Cholecalciferol (VITAMIN D) 2000 UNITS tablet Take 2,000 Units by mouth daily. cloNIDine (CATAPRES - DOSED IN MG/24 HR) 0.1 mg/24hr patch Place 0.1 mg onto the skin once a week. cloNIDine (CATAPRES) 0.1 MG tablet Take 0.1 mg by mouth as needed. Give if BP is greater than 160/90. colchicine 0.6 MG tablet Take 0.6 mg by mouth daily. diphenhydrAMINE (BENADRYL) 25 MG tablet Take 25 mg by mouth every 8 (eight) hours as needed. donepezil (ARICEPT) 5 MG tablet Take 5 mg by mouth at bedtime. famotidine (PEPCID) 20 MG tablet Take 20 mg by mouth as needed for heartburn or indigestion. ferrous sulfate 325 (65 FE) MG tablet Take 325 mg by mouth daily. furosemide (LASIX) 20 MG tablet Take 20 mg by mouth daily.  guaiFENesin (MUCINEX) 600 MG 12 hr tablet Take 600 mg by mouth every 12 (twelve) hours as needed (congestion). guaifenesin (ROBITUSSIN) 100 MG/5ML syrup Take 200 mg by mouth as needed for cough. Take 38ml q6h as needed. ipratropium-albuterol (DUONEB) 0.5-2.5 (3) MG/3ML SOLN Take 3 mLs by nebulization every 6 (six) hours as needed (SHORTNESS OF BREATH).  lisinopril (PRINIVIL,ZESTRIL) 5 MG tablet Take 5 mg by mouth daily. LORazepam (ATIVAN) 0.5 MG tablet Take 0.5 mg by mouth every 8 (eight) hours. nitroGLYCERIN  (NITROSTAT) 0.4 MG SL tablet Place 0.4 mg under the tongue every 5 (five) minutes as needed for chest pain. ondansetron (ZOFRAN) 4 MG tablet Take 4 mg by mouth every 8 (eight) hours as needed for nausea or vomiting. oxyCODONE (OXY IR/ROXICODONE) 5 MG immediate release tablet Take 5 mg by mouth every 8 (eight) hours. oxymetazoline (AFRIN) 0.05 % nasal spray Place 2 sprays into both nostrils as needed (nosebleeds).  polyethylene glycol (MIRALAX / GLYCOLAX) packet Take 17 g by mouth daily. Propylene Glycol (SYSTANE BALANCE) 0.6 % SOLN Apply 2 drops to eye at bedtime as needed (DRY EYES). QUEtiapine (SEROQUEL) 25 MG tablet Take 50 mg by mouth at bedtime. senna-docusate (SENOKOT-S) 8.6-50 MG per tablet Take 2 tablets by mouth 2 (two) times daily.  sertraline (ZOLOFT) 25 MG tablet   Technical Summary: A multichannel digital EEG recording measured by the international 10-20 system with electrodes applied with paste and impedances below 5000 ohms performed as portable with EKG monitoring in an awake and drowsy patient.  Hyperventilation was not performed. Photic stimulation was performed.  The digital EEG was referentially recorded, reformatted, and digitally filtered in a variety of bipolar and referential montages for optimal display.   Description: The patient is awake and drowsy during the recording.  During maximal wakefulness, there is a symmetric, medium voltage 5-5.5 Hz posterior dominant rhythm that attenuates with eye opening. This is admixed with a moderate amount of diffuse 4-5 Hz theta and occasional diffuse 2-3 Hz delta slowing of the waking background.  During drowsiness, there is an increase  in theta and delta slowing of the background. Photic stimulation did not elicit any abnormalities.  There were no epileptiform discharges or electrographic seizures seen.    EKG lead was unremarkable.  Impression: This awake and drowsy EEG is abnormal due to moderate diffuse slowing of the waking  background.  Clinical Correlation of the above findings indicates diffuse cerebral dysfunction that is non-specific in etiology and can be seen with hypoxic/ischemic injury, toxic/metabolic encephalopathies, neurodegenerative disorders, or medication effect.  The absence of epileptiform discharges does not rule out a clinical diagnosis of epilepsy.  Clinical correlation is advised.   Ellouise Newer, M.D.

## 2017-11-17 NOTE — Progress Notes (Signed)
EEG Completed; Results Pending  

## 2017-11-18 ENCOUNTER — Telehealth: Payer: Self-pay | Admitting: Neurology

## 2017-11-18 ENCOUNTER — Telehealth: Payer: Self-pay | Admitting: *Deleted

## 2017-11-18 ENCOUNTER — Ambulatory Visit (HOSPITAL_COMMUNITY): Payer: Self-pay

## 2017-11-18 NOTE — Telephone Encounter (Signed)
Please call patient's wife, EEG showed moderate diffuse slowing, no evidence of epileptiform discharge.   Impression: This awake and drowsy EEG is abnormal due to moderate diffuse slowing of the waking background.  Clinical Correlation of the above findings indicates diffuse cerebral dysfunction that is non-specific in etiology and can be seen with hypoxic/ischemic injury, toxic/metabolic encephalopathies, neurodegenerative disorders, or medication effect.  The absence of epileptiform discharges does not rule out a clinical diagnosis of epilepsy.  Clinical correlation is advised.

## 2017-11-18 NOTE — Telephone Encounter (Signed)
Left message requesting for his wife to return my call.

## 2017-11-18 NOTE — Telephone Encounter (Signed)
Duplicate note

## 2017-11-19 ENCOUNTER — Ambulatory Visit (HOSPITAL_COMMUNITY)
Admission: RE | Admit: 2017-11-19 | Discharge: 2017-11-19 | Disposition: A | Discharge status: Home / Self Care | Payer: Medicare HMO | Source: Ambulatory Visit | Attending: Neurology | Admitting: Neurology

## 2017-11-19 ENCOUNTER — Encounter (HOSPITAL_COMMUNITY): Payer: Self-pay | Admitting: Radiology

## 2017-11-19 DIAGNOSIS — R410 Disorientation, unspecified: Secondary | ICD-10-CM | POA: Insufficient documentation

## 2017-11-19 DIAGNOSIS — I6782 Cerebral ischemia: Secondary | ICD-10-CM | POA: Insufficient documentation

## 2017-11-19 NOTE — Telephone Encounter (Signed)
Spoke to patient's wife and she is aware of results.

## 2017-11-22 ENCOUNTER — Telehealth: Payer: Self-pay | Admitting: Neurology

## 2017-11-22 NOTE — Telephone Encounter (Signed)
  Please call patient, MRI of the brain showed no acute abnormality, but there is evidence of chronic changes, moderate generalized atrophy, moderate supratentorium small vessel disease.  I will review films with him at next follow-up visit.  IMPRESSION: 1. No acute intracranial abnormality. 2. Moderate chronic small vessel ischemic disease and cerebral atrophy.

## 2017-11-23 NOTE — Telephone Encounter (Signed)
Spoke to patient's wife - she is aware of results and verbalized understanding.

## 2018-02-10 ENCOUNTER — Telehealth: Payer: Self-pay | Admitting: Internal Medicine

## 2018-02-10 NOTE — Telephone Encounter (Signed)
Noted. Letter mailed to pt. 

## 2018-02-10 NOTE — Telephone Encounter (Signed)
PATIENT ON RECALL FOR COLOGUARD

## 2018-04-08 NOTE — Progress Notes (Deleted)
[facility]  PATIENT: Mark [name] DOB: Jan 04, 1946   REASON FOR VISIT: *** HISTORY FROM:    HISTORY OF PRESENT ILLNESS: Mark [name] is a 72 years old right-handed male, accompanied by his wife [name], seen in refer by his primary care doctor Hilbert Corrigan for evaluation of Parkinson's disease, initial evaluation was on June 08 2016.  He came in with wheelchair today, he is currently a resident of [facility] since 2013 Reviewed and summarized the referring note, he had a history of atrial fibrillation on chronic anticoagulation Eliquis, seizure, coronary artery disease, status post stent in 2012, hypertension, history of stroke, memory loss, sleep apnea, hyperlipidemia,  He was diagnosed with Parkinson's disease since 2007, initial symptoms was bradykinesia, difficulty to get in and out of shower, later noted shuffling gait, was treated by Dr. Jannifer Franklin before.  Then he was treated by Dr. Janann Colonel over the past few years.  He was treated with sinemet for many years, now taking Sinemet 25/100 2 tabs tid,  for a while he was taking high dose of Sinemet 25/100 mg up to 2 tablets 5 times a day, even at that dosage, he could not walk, could not stand up even with that dose.  He had sepsis from UTI in 2013, was discharged to nursing home since then, at that time he become wheelchair dependent, also totally dependent on his daily activity, shower, bathing, toileting, feeding, brushing his teeth. He remained in that level of care since.  He has frequent UTI, chronic constipation.  He has no incontinence.  He also had worsening memory loss, he is a retired Tax adviser at age 46 years old, he also had chronic low back pain, he still play cards, bingo, he is easily agitated, he sleeps well, he has delusional idea sometimes, very sensitive to sound,  He is on polypharmacy treatment, zoloft '25mg'$  qday, seroquel '25mg'$  2 tabs qhs, aricept '5mg'$  qhs, sinemet 25/100 2  tabs tid.   Overall, he had slowly decline over the past few years, but no dramatic changes, he needs more help to get in and out of his wheelchair, needing lift to transfer. He has recent dysphagia, was seen by speech pathologist, improved with neck flexion technique  Laboratory evaluation in September 2017, elevated creatinine 1.38, glucose 105, hemoglobin 13 point 5, ESR 18,  He had history of seizure since 2007, he is now taking keppra '250mg'$  bid, last seizure was around 2013.  UPDATE December 10 2016: He has no recurrent seizure, still taking Keppra 250 mg twice a day, no significant change compared to last visit in October 2017,  Is not on polypharmacy treatment, seroquel 25 mg 2 tablets every night, Ativan 0.5 milligrams every 8 hours,   I reviewed his record, he has chronic pain, bilateral lower extremity and bilateral hand swelling, renal insufficiency, hypertension, she is no longer ambulatory, has not been walked for few years, paroxysmal atrial fibrillation Eliquis,    He recently had right ear area skin cancer surgery, he wears diaper, need assistance on all daily activities including feeding,  UPDATE Oct 11 2017: He is accompanied by his wife at today's clinical visit, he sits in wheelchair, continued to decline gradually, tends to lean towards the left side, not talking much anymore, worsening memory loss, confused easily, can no longer walk, he is currently a resident at [facility].   REVIEW OF SYSTEMS: Full 14 system review of systems performed and notable only for those listed,  all others are neg:  Constitutional: neg  Cardiovascular: neg Ear/Nose/Throat: neg  Skin: neg Eyes: neg Respiratory: neg Gastroitestinal: neg  Hematology/Lymphatic: neg  Endocrine: neg Musculoskeletal:neg Allergy/Immunology: neg Neurological: neg Psychiatric: neg Sleep : neg   ALLERGIES: Allergies  Allergen Reactions  . Rocephin [Ceftriaxone  Sodium In Dextrose] Other (See Comments)    Unknown reaction  . Ciprofloxacin Rash    HOME MEDICATIONS: Outpatient Medications Prior to Visit  Medication Sig Dispense Refill  . apixaban (ELIQUIS) 5 MG TABS tablet Take 5 mg by mouth 2 (two) times daily.    Marland Kitchen atorvastatin (LIPITOR) 40 MG tablet Take 40 mg by mouth daily.    . carbidopa-levodopa (SINEMET IR) 25-100 MG tablet Take by mouth 3 (three) times daily.    . carvedilol (COREG) 3.125 MG tablet Take 1 tablet (3.125 mg total) by mouth 2 (two) times daily with a meal. 60 tablet 6  . Cholecalciferol (VITAMIN D) 2000 UNITS tablet Take 2,000 Units by mouth daily.    . cloNIDine (CATAPRES - DOSED IN MG/24 HR) 0.1 mg/24hr patch Place 0.1 mg onto the skin once a week.    . cloNIDine (CATAPRES) 0.1 MG tablet Take 0.1 mg by mouth as needed. Give if BP is greater than 160/90.    Marland Kitchen colchicine 0.6 MG tablet Take 0.6 mg by mouth daily.    . diphenhydrAMINE (BENADRYL) 25 MG tablet Take 25 mg by mouth every 8 (eight) hours as needed.    . divalproex (DEPAKOTE) 500 MG DR tablet Take 1 tablet (500 mg total) by mouth 2 (two) times daily. 60 tablet 6  . donepezil (ARICEPT) 5 MG tablet Take 5 mg by mouth at bedtime.    . famotidine (PEPCID) 20 MG tablet Take 20 mg by mouth as needed for heartburn or indigestion.    . ferrous sulfate 325 (65 FE) MG tablet Take 325 mg by mouth daily.    . furosemide (LASIX) 20 MG tablet Take 20 mg by mouth daily.     Marland Kitchen guaiFENesin (MUCINEX) 600 MG 12 hr tablet Take 600 mg by mouth every 12 (twelve) hours as needed (congestion).    Marland Kitchen guaifenesin (ROBITUSSIN) 100 MG/5ML syrup Take 200 mg by mouth as needed for cough. Take 10m q6h as needed.    .Marland Kitchenipratropium-albuterol (DUONEB) 0.5-2.5 (3) MG/3ML SOLN Take 3 mLs by nebulization every 6 (six) hours as needed (SHORTNESS OF BREATH).     .Marland Kitchenlisinopril (PRINIVIL,ZESTRIL) 5 MG tablet Take 5 mg by mouth daily.    .Marland KitchenLORazepam (ATIVAN) 0.5 MG tablet Take 0.5 mg by mouth every 8 (eight)  hours.    . nitroGLYCERIN (NITROSTAT) 0.4 MG SL tablet Place 0.4 mg under the tongue every 5 (five) minutes as needed for chest pain.    .Marland Kitchenondansetron (ZOFRAN) 4 MG tablet Take 4 mg by mouth every 8 (eight) hours as needed for nausea or vomiting.    .Marland KitchenoxyCODONE (OXY IR/ROXICODONE) 5 MG immediate release tablet Take 5 mg by mouth every 8 (eight) hours.    .Marland Kitchenoxymetazoline (AFRIN) 0.05 % nasal spray Place 2 sprays into both nostrils as needed (nosebleeds).     . polyethylene glycol (MIRALAX / GLYCOLAX) packet Take 17 g by mouth daily.    .Marland KitchenPropylene Glycol (SYSTANE BALANCE) 0.6 % SOLN Apply 2 drops to eye at bedtime as needed (DRY EYES).    . QUEtiapine (SEROQUEL) 25 MG tablet Take 50 mg by mouth at bedtime.    . senna-docusate (SENOKOT-S) 8.6-50 MG per tablet  Take 2 tablets by mouth 2 (two) times daily.     . sertraline (ZOLOFT) 25 MG tablet Take 100 mg by mouth daily.     . vitamin B-12 (CYANOCOBALAMIN) 100 MCG tablet Take 100 mcg by mouth daily.    . vitamin C (ASCORBIC ACID) 500 MG tablet Take 500 mg by mouth 2 (two) times daily.     No facility-administered medications prior to visit.     PAST MEDICAL HISTORY: Past Medical History:  Diagnosis Date  . Arthritis   . Bacteremia due to Escherichia coli 06/21/2012  . Cervical vertebral fracture ([place])   . Coronary artery disease    SEVERE 3-VESSEL DX WITH NORMAL EF  . Fall   . Fracture of right olecranon process 10/23/2011  . Gout   . Hypertension   . Kidney stones   . MI (myocardial infarction) ([place]) 08/2011   /H&P (05/12/2012)  . Non-ST elevated myocardial infarction (non-STEMI) ([place])   . PAF (paroxysmal atrial fibrillation) ([place])   . Parkinson's disease   . 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 Adenoma.  . Pyelonephritis, acute 06/23/2012  . Seizures ([place]) 12/10/2011   HAD SEIZURE  . Skin cancer    left shoulder  . Sleep apnea   . Stroke ([place]) 10/12   Hx of remote stroke, RT SIDED WEAKNESS    PAST SURGICAL HISTORY: Past Surgical History:    Procedure Laterality Date  . CARDIAC CATHETERIZATION  06/26/2011   NORMAL LV SYSTOLIC FUNCTION. RCA IS LARGE AND DOMINANT  . CORONARY ANGIOPLASTY WITH STENT PLACEMENT  ~ 2000   "one that I know of"  . HARDWARE REMOVAL  12/29/2011   Procedure: HARDWARE REMOVAL;  Surgeon: Johnny Bridge, MD;  Location: [facility];  Service: Orthopedics;  Laterality: Right;  . ORIF ELBOW FRACTURE  12/29/2011   Procedure: OPEN REDUCTION INTERNAL FIXATION (ORIF) ELBOW/OLECRANON FRACTURE;  Surgeon: Johnny Bridge, MD;  Location: [facility];  Service: Orthopedics;  Laterality: Right;  . PAROTIDECTOMY  05/12/2012   right  . PAROTIDECTOMY  05/12/2012   Procedure: PAROTIDECTOMY;  Surgeon: Rozetta Nunnery, MD;  Location: [facility];  Service: ENT;  Laterality: Right;  PAROTIDECTOMY MASS  . SKIN CANCER EXCISION  2013   left shoulder  . SKIN CANCER EXCISION     right ear  . TRANSTHORACIC ECHOCARDIOGRAM  06/26/2011   EF 55-60%    FAMILY HISTORY: Family History  Problem Relation Age of Onset  . Heart attack Father   . Heart disease Unknown   . Arthritis Unknown   . Colon cancer Neg Hx   . Liver disease Neg Hx     SOCIAL HISTORY: Social History   Socioeconomic History  . Marital status: Married    Spouse name: Not on file  . Number of children: 1  . Years of education: Bachelors  . Highest education level: Not on file  Occupational History  . Occupation: Disabled  Social Needs  . Financial resource strain: Not on file  . Food insecurity:    Worry: Not on file    Inability: Not on file  . Transportation needs:    Medical: Not on file    Non-medical: Not on file  Tobacco Use  . Smoking status: Never Smoker  . Smokeless tobacco: Never Used  Substance and Sexual Activity  . Alcohol use: No    Alcohol/week: 0.0 oz  . Drug use: No  . Sexual activity: Yes  Lifestyle  . Physical activity:    Days per week: Not  on file    Minutes per session: Not on file  . Stress: Not on file  Relationships  . Social  connections:    Talks on phone: Not on file    Gets together: Not on file    Attends religious service: Not on file    Active member of club or organization: Not on file    Attends meetings of clubs or organizations: Not on file    Relationship status: Not on file  . Intimate partner violence:    Fear of current or ex partner: Not on file    Emotionally abused: Not on file    Physically abused: Not on file    Forced sexual activity: Not on file  Other Topics Concern  . Not on file  Social History Narrative   Lives at [facility] in [place].   Right-handed.   No caffeine use.     PHYSICAL EXAM  There were no vitals filed for this visit. There is no height or weight on file to calculate BMI.  Generalized: Well developed, in no acute distress  Head: normocephalic and atraumatic,. Oropharynx benign  Neck: Supple, no carotid bruits  Cardiac: Regular rate rhythm, no murmur  Musculoskeletal: No deformity   Neurological examination   Mentation: Alert oriented to time, place, history taking. Attention span and concentration appropriate. Recent and remote memory intact.  Follows all commands speech and language fluent.   Cranial nerve II-XII: Fundoscopic exam reveals sharp disc margins.Pupils were equal round reactive to light extraocular movements were full, visual field were full on confrontational test. Facial sensation and strength were normal. hearing was intact to finger rubbing bilaterally. Uvula tongue midline. head turning and shoulder shrug were normal and symmetric.Tongue protrusion into cheek strength was normal. Motor: normal bulk and tone, full strength in the BUE, BLE, fine finger movements normal, no pronator drift. No focal weakness Sensory: normal and symmetric to light touch, pinprick, and  Vibration, proprioception  Coordination: finger-nose-finger, heel-to-shin bilaterally, no dysmetria Reflexes: Brachioradialis 2/2, biceps 2/2, triceps 2/2, patellar 2/2,  Achilles 2/2, plantar responses were flexor bilaterally. Gait and Station: Rising up from seated position without assistance, normal stance,  moderate stride, good arm swing, smooth turning, able to perform tiptoe, and heel walking without difficulty. Tandem gait is steady  DIAGNOSTIC DATA (LABS, IMAGING, TESTING) - I reviewed patient records, labs, notes, testing and imaging myself where available.  Lab Results  Component Value Date   WBC 8.4 01/25/2016   HGB 13.3 01/25/2016   HCT 39.4 01/25/2016   MCV 92.9 01/25/2016   PLT 280 01/25/2016      Component Value Date/Time   NA 139 01/25/2016 0506   K 3.4 (L) 01/25/2016 0506   CL 107 01/25/2016 0506   CO2 24 01/25/2016 0506   GLUCOSE 103 (H) 01/25/2016 0506   BUN 18 01/25/2016 0506   BUN 19 08/15/2013   CREATININE 1.19 01/25/2016 0506   CREATININE 1.28 08/15/2013   CALCIUM 8.8 (L) 01/25/2016 0506   PROT 8.2 (H) 01/24/2016 0029   PROT 6.2 10/03/2013   ALBUMIN 4.3 01/24/2016 0029   ALBUMIN 3.4 10/03/2013   AST 21 01/24/2016 0029   AST 13 10/03/2013   ALT 15 (L) 01/24/2016 0029   ALKPHOS 80 01/24/2016 0029   ALKPHOS 75 10/03/2013   BILITOT 0.6 01/24/2016 0029   BILITOT 0.3 10/03/2013   GFRNONAA >60 01/25/2016 0506   GFRAA >60 01/25/2016 0506   Lab Results  Component Value Date   CHOL 92  08/22/2014   HDL 28 (L) 08/22/2014   LDLCALC 40 08/22/2014   TRIG 121 08/22/2014   CHOLHDL 3.3 08/22/2014   Lab Results  Component Value Date   HGBA1C 5.5 08/22/2014   Lab Results  Component Value Date   VITAMINB12 898 06/20/2012   Lab Results  Component Value Date   TSH 5.370 (H) 08/22/2014      ASSESSMENT AND PLAN  72 y.o. 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 here with *** Mark [name] is a 72 y.o. male    Parkinson's disease             Keep current dose of Sinemet 25/100 mg 2 tablets 3 times a day  Dementia with agitation             Continue Aricept 5 mg daily  Epilepsy:             Was on Keppra 250 mg twice a day, last seizure was in 2013             Because of his increased agitations, will change to Depakote DR '500mg'$  bid.  Gait abnormality             Multifactorial, due to his Parkinson's disease, deconditioning, morbid obesity    Dennie Bible, [facility], [facility], APRN  [facility] [address] [place], [place] [address] [phone]

## 2018-04-11 ENCOUNTER — Ambulatory Visit: Payer: Medicare HMO | Admitting: Nurse Practitioner

## 2018-04-12 ENCOUNTER — Encounter: Payer: Self-pay | Admitting: Nurse Practitioner

## 2018-05-25 NOTE — Progress Notes (Signed)
Mark [name]  PATIENT: Mark [name] DOB: [birth year]   REASON FOR VISIT: Follow-up for seizure disorder, Parkinson's disease HISTORY FROM: Patient and wife    HISTORY OF PRESENT ILLNESS: Mark [name] is a 73 years old right-handed male, accompanied by his wife [name], seen in refer by his primary care doctor Hilbert Corrigan for evaluation of Parkinson's disease, initial evaluation was on June 08 2016.  He came in with wheelchair today, he is currently a resident of [facility] since 2013 Reviewed and summarized the referring note, he had a history of atrial fibrillation on chronic anticoagulation Eliquis, seizure, coronary artery disease, status post stent in 2012, hypertension, history of stroke, memory loss, sleep apnea, hyperlipidemia,  He was diagnosed with Parkinson's disease since 2007, initial symptoms was bradykinesia, difficulty to get in and out of shower, later noted shuffling gait, was treated by Dr. Jannifer Franklin before.  Then he was treated by Dr. Janann Colonel over the past few years.  He was treated with sinemet for many years, now taking Sinemet 25/100 2 tabs tid,  for a while he was taking high dose of Sinemet 25/100 mg up to 2 tablets 5 times a day, even at that dosage, he could not walk, could not stand up even with that dose.  He had sepsis from UTI in 2013, was discharged to nursing home since then, at that time he become wheelchair dependent, also totally dependent on his daily activity, shower, bathing, toileting, feeding, brushing his teeth. He remained in that level of care since.  He has frequent UTI, chronic constipation.  He has no incontinence.  He also had worsening memory loss, he is a retired Tax adviser at age 89 years old, he also had chronic low back pain, he still play cards, bingo, he is easily agitated, he sleeps well, he has delusional idea sometimes, very sensitive to sound,  He is on polypharmacy treatment, zoloft '25mg'$   qday, seroquel '25mg'$  2 tabs qhs, aricept '5mg'$  qhs, sinemet 25/100 2 tabs tid.   Overall, he had slowly decline over the past few years, but no dramatic changes, he needs more help to get in and out of his wheelchair, needing lift to transfer. He has recent dysphagia, was seen by speech pathologist, improved with neck flexion technique  Laboratory evaluation in September 2017, elevated creatinine 1.38, glucose 105, hemoglobin 13 point 5, ESR 18,  He had history of seizure since 2007, he is now taking keppra '250mg'$  bid, last seizure was around 2013.  UPDATE December 10 2016: He has no recurrent seizure, still taking Keppra 250 mg twice a day, no significant change compared to last visit in October 2017,  Is not on polypharmacy treatment, seroquel 25 mg 2 tablets every night, Ativan 0.5 milligrams every 8 hours,   I reviewed his record, he has chronic pain, bilateral lower extremity and bilateral hand swelling, renal insufficiency, hypertension, she is no longer ambulatory, has not been walked for few years, paroxysmal atrial fibrillation Eliquis,    He recently had right ear area skin cancer surgery, he wears diaper, need assistance on all daily activities including feeding,  UPDATE Oct 11 2017: He is accompanied by his wife at today's clinical visit, he sits in wheelchair, continued to decline gradually, tends to lean towards the left side, not talking much anymore, worsening memory loss, confused easily, can no longer walk, he is currently a resident at [facility]. UPDATE 9/19/2019CM Mark [name], 72 year old male returns for follow-up with  his wife.  He currently resides in a skilled nursing facility.  He has been nonambulatory for 6 years.  He has a history of atrial fibrillation dementia seizure disorder.  No seizures in 3 years.  He is failed Keppra due to at agitation and is now on Depakote 500 twice daily he is on Eliquis for his atrial fibrillation.   He is on Aricept for his dementia.  EEG 3/13/2019This awake and drowsy EEG is abnormal due to moderate diffuse slowing of the waking background. Clinical Correlation of the above findings indicates diffuse cerebral dysfunction that is non-specific in etiology and can be seen with hypoxic/ischemic injury, toxic/metabolic encephalopathies, neurodegenerative disorders, or medication effect.  The absence of epileptiform discharges does not rule out a clinical diagnosis of epilepsy.  Clinical correlation is advised. MRI of the brain 3/15/2019IMPRESSION: 1. No acute intracranial abnormality. 2. Moderate chronic small vessel ischemic disease and cerebral atrophy.  Copies of both of these were given to the wife to take to the nursing facility.  He returns for reevaluation REVIEW OF SYSTEMS: Full 14 system review of systems performed and notable only for those listed, all others are neg:  Constitutional: neg  Cardiovascular: neg Ear/Nose/Throat: neg  Skin: neg Eyes: neg Respiratory: neg Gastroitestinal: Constipation Hematology/Lymphatic: neg  Endocrine: neg Musculoskeletal: Nonambulatory Allergy/Immunology: neg Neurological: Decreased speech memory loss Psychiatric: Depression Sleep : neg   ALLERGIES: Allergies  Allergen Reactions  . Keppra [Levetiracetam] Other (See Comments)    Increased agitation  . Rocephin [Ceftriaxone Sodium In Dextrose] Other (See Comments)    Unknown reaction  . Ciprofloxacin Rash    HOME MEDICATIONS: Outpatient Medications Prior to Visit  Medication Sig Dispense Refill  . apixaban (ELIQUIS) 5 MG TABS tablet Take 5 mg by mouth 2 (two) times daily.    Marland Kitchen atorvastatin (LIPITOR) 40 MG tablet Take 40 mg by mouth daily.    . bisacodyl (DULCOLAX) 10 MG suppository Place 10 mg rectally as needed for moderate constipation.    . carbidopa-levodopa (SINEMET IR) 25-100 MG tablet Take by mouth 3 (three) times daily.    . carvedilol (COREG) 3.125 MG tablet Take 1 tablet  (3.125 mg total) by mouth 2 (two) times daily with a meal. 60 tablet 6  . Cholecalciferol (VITAMIN D) 2000 UNITS tablet Take 2,000 Units by mouth daily.    . cloNIDine (CATAPRES - DOSED IN MG/24 HR) 0.1 mg/24hr patch Place 0.1 mg onto the skin once a week.    . cloNIDine (CATAPRES) 0.1 MG tablet Take 0.1 mg by mouth as needed. Give if BP is greater than 160/90.    Marland Kitchen colchicine 0.6 MG tablet Take 0.6 mg by mouth daily.    . diphenhydrAMINE (BENADRYL) 25 MG tablet Take 25 mg by mouth every 8 (eight) hours as needed.    . divalproex (DEPAKOTE) 500 MG DR tablet Take 1 tablet (500 mg total) by mouth 2 (two) times daily. 60 tablet 6  . donepezil (ARICEPT) 5 MG tablet Take 5 mg by mouth at bedtime.    . DULoxetine (CYMBALTA) 20 MG capsule Take 20 mg by mouth 2 (two) times daily.    . famotidine (PEPCID) 20 MG tablet Take 20 mg by mouth as needed for heartburn or indigestion.    . ferrous sulfate 325 (65 FE) MG tablet Take 325 mg by mouth daily.    . furosemide (LASIX) 20 MG tablet Take 20 mg by mouth daily.     . furosemide (LASIX) 40 MG tablet Take 40 mg by  mouth as needed. Every 8 hr for swelling prn    . gabapentin (NEURONTIN) 100 MG capsule Take 100 mg by mouth 3 (three) times daily.    Marland Kitchen guaiFENesin (MUCINEX) 600 MG 12 hr tablet Take 600 mg by mouth every 12 (twelve) hours as needed (congestion).    Marland Kitchen guaifenesin (ROBITUSSIN) 100 MG/5ML syrup Take 200 mg by mouth as needed for cough. Take 69m q6h as needed.    .Marland Kitchenipratropium-albuterol (DUONEB) 0.5-2.5 (3) MG/3ML SOLN Take 3 mLs by nebulization every 6 (six) hours as needed (SHORTNESS OF BREATH).     .Marland Kitchenlisinopril (PRINIVIL,ZESTRIL) 5 MG tablet Take 5 mg by mouth daily.    .Marland KitchenLORazepam (ATIVAN) 0.5 MG tablet Take 0.5 mg by mouth every 8 (eight) hours.    . Melatonin 5 MG CAPS Take by mouth as needed.    . nitroGLYCERIN (NITROSTAT) 0.4 MG SL tablet Place 0.4 mg under the tongue every 5 (five) minutes as needed for chest pain.    .Marland Kitchenondansetron (ZOFRAN)  4 MG tablet Take 4 mg by mouth every 8 (eight) hours as needed for nausea or vomiting.    .Marland KitchenoxyCODONE (OXY IR/ROXICODONE) 5 MG immediate release tablet Take 5 mg by mouth every 8 (eight) hours.    .Marland Kitchenoxymetazoline (AFRIN) 0.05 % nasal spray Place 2 sprays into both nostrils as needed (nosebleeds).     . polyethylene glycol (MIRALAX / GLYCOLAX) packet Take 17 g by mouth daily.    .Marland KitchenPropylene Glycol (SYSTANE BALANCE) 0.6 % SOLN Apply 2 drops to eye at bedtime as needed (DRY EYES).    . QUEtiapine (SEROQUEL) 25 MG tablet Take 50 mg by mouth at bedtime.    . senna-docusate (SENOKOT-S) 8.6-50 MG per tablet Take 2 tablets by mouth 2 (two) times daily.     . sertraline (ZOLOFT) 25 MG tablet Take 100 mg by mouth daily.     . vitamin B-12 (CYANOCOBALAMIN) 100 MCG tablet Take 100 mcg by mouth daily.    . vitamin C (ASCORBIC ACID) 500 MG tablet Take 500 mg by mouth 2 (two) times daily.     No facility-administered medications prior to visit.     PAST MEDICAL HISTORY: Past Medical History:  Diagnosis Date  . Arthritis   . Bacteremia due to Escherichia coli 06/21/2012  . Cervical vertebral fracture ([facility]   . Coronary artery disease    SEVERE 3-VESSEL DX WITH NORMAL EF  . Fall   . Fracture of right olecranon process 10/23/2011  . Gout   . Hypertension   . Kidney stones   . MI (myocardial infarction) ([facility] 08/2011   /H&P (05/12/2012)  . Non-ST elevated myocardial infarction (non-STEMI) ([facility]   . PAF (paroxysmal atrial fibrillation) ([facility]   . Parkinson's disease   . 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 Adenoma.  . Pyelonephritis, acute 06/23/2012  . Seizures ([facility] 12/10/2011   HAD SEIZURE  . Skin cancer    left shoulder  . Sleep apnea   . Stroke ([facility] 10/12   Hx of remote stroke, RT SIDED WEAKNESS    PAST SURGICAL HISTORY: Past Surgical History:  Procedure Laterality Date  . CARDIAC CATHETERIZATION  06/26/2011   NORMAL LV SYSTOLIC FUNCTION. RCA IS LARGE AND DOMINANT  . CORONARY ANGIOPLASTY WITH STENT  PLACEMENT  ~ 2000   "one that I know of"  . HARDWARE REMOVAL  12/29/2011   Procedure: HARDWARE REMOVAL;  Surgeon: JJohnny Bridge MD;  [facility]  Service: Orthopedics;  Laterality:  Right;  Marland Kitchen ORIF ELBOW FRACTURE  12/29/2011   Procedure: OPEN REDUCTION INTERNAL FIXATION (ORIF) ELBOW/OLECRANON FRACTURE;  Surgeon: Johnny Bridge, MD;  Location: [facility];  Service: Orthopedics;  Laterality: Right;  . PAROTIDECTOMY  05/12/2012   right  . PAROTIDECTOMY  05/12/2012   Procedure: PAROTIDECTOMY;  Surgeon: Rozetta Nunnery, MD;  Location: [facility];  Service: ENT;  Laterality: Right;  PAROTIDECTOMY MASS  . SKIN CANCER EXCISION  2013   left shoulder  . SKIN CANCER EXCISION     right ear  . TRANSTHORACIC ECHOCARDIOGRAM  06/26/2011   EF 55-60%    FAMILY HISTORY: Family History  Problem Relation Age of Onset  . Heart attack Father   . Heart disease Unknown   . Arthritis Unknown   . Colon cancer Neg Hx   . Liver disease Neg Hx     SOCIAL HISTORY: Social History   Socioeconomic History  . Marital status: Married    Spouse name: Not on file  . Number of children: 1  . Years of education: Bachelors  . Highest education level: Not on file  Occupational History  . Occupation: Disabled  Social Needs  . Financial resource strain: Not on file  . Food insecurity:    Worry: Not on file    Inability: Not on file  . Transportation needs:    Medical: Not on file    Non-medical: Not on file  Tobacco Use  . Smoking status: Never Smoker  . Smokeless tobacco: Never Used  Substance and Sexual Activity  . Alcohol use: No    Alcohol/week: 0.0 standard drinks  . Drug use: No  . Sexual activity: Yes  Lifestyle  . Physical activity:    Days per week: Not on file    Minutes per session: Not on file  . Stress: Not on file  Relationships  . Social connections:    Talks on phone: Not on file    Gets together: Not on file    Attends religious service: Not on file    Active member of club or  organization: Not on file    Attends meetings of clubs or organizations: Not on file    Relationship status: Not on file  . Intimate partner violence:    Fear of current or ex partner: Not on file    Emotionally abused: Not on file    Physically abused: Not on file    Forced sexual activity: Not on file  Other Topics Concern  . Not on file  Social History Narrative   Lives at [facility] home in [place].   Right-handed.   No caffeine use.     PHYSICAL EXAM  Vitals:   05/26/18 1342  BP: 105/66  Pulse: 73  Height: '6\' 2"'$  (1.88 m)   Body mass index is 35.31 kg/m.  Generalized: Well developed, obese male in no acute distress , well-groomed Head: normocephalic and atraumatic,. Oropharynx benign  Neck: Supple, no carotid bruits  Cardiac: Regular rate rhythm, no murmur  Musculoskeletal: No deformity   Neurological examination   Mentation: Alert dependent on wife to answer questions    Cranial nerve II-XII: Pupils were equal round reactive to light extraocular movements were full, visual field were full on confrontational test. Facial sensation and strength were normal. hearing was intact to finger rubbing bilaterally. Uvula tongue midline. head turning and shoulder shrug were normal and symmetric.Tongue protrusion into cheek strength was normal. Motor: Significant limb and nuchal rigidity, bradykinesia no significant weakness Sensory:  Withdraws to pain   Coordination: finger-nose-finger,  no dysmetria Reflexes: Hypoactive throughout  plantar responses were flexor bilaterally. Gait and Station: In wheelchair not ambulated   DIAGNOSTIC DATA (LABS, IMAGING, TESTING) - I reviewed patient records, labs, notes, testing and imaging myself where available.  Lab Results  Component Value Date   WBC 8.4 01/25/2016   HGB 13.3 01/25/2016   HCT 39.4 01/25/2016   MCV 92.9 01/25/2016   PLT 280 01/25/2016      Component Value Date/Time   NA 139 01/25/2016 0506   K 3.4 (L)  01/25/2016 0506   CL 107 01/25/2016 0506   CO2 24 01/25/2016 0506   GLUCOSE 103 (H) 01/25/2016 0506   BUN 18 01/25/2016 0506   BUN 19 08/15/2013   CREATININE 1.19 01/25/2016 0506   CREATININE 1.28 08/15/2013   CALCIUM 8.8 (L) 01/25/2016 0506   PROT 8.2 (H) 01/24/2016 0029   PROT 6.2 10/03/2013   ALBUMIN 4.3 01/24/2016 0029   ALBUMIN 3.4 10/03/2013   AST 21 01/24/2016 0029   AST 13 10/03/2013   ALT 15 (L) 01/24/2016 0029   ALKPHOS 80 01/24/2016 0029   ALKPHOS 75 10/03/2013   BILITOT 0.6 01/24/2016 0029   BILITOT 0.3 10/03/2013   GFRNONAA >60 01/25/2016 0506   GFRAA >60 01/25/2016 0506   Lab Results  Component Value Date   CHOL 92 08/22/2014   HDL 28 (L) 08/22/2014   LDLCALC 40 08/22/2014   TRIG 121 08/22/2014   CHOLHDL 3.3 08/22/2014   Lab Results  Component Value Date   HGBA1C 5.5 08/22/2014   Lab Results  Component Value Date   VITAMINB12 898 06/20/2012   Lab Results  Component Value Date   TSH 5.370 (H) 08/22/2014      ASSESSMENT AND PLAN Mark [name] is a 72 y.o. male   here to follow-up for Parkinson's disease, dementia, epilepsy and multifactorial gait disorder.   PLAN: Continue Sinemet 25/100 mg 2 tablets 3 times a day Continue Aricept 5 mg daily Continue Depakote 500 twice daily, this is for seizure and agitation last seizure 2013 Gait abnormality multifactorial due to his Parkinson's disease, deconditioning, morbid obesity Follow-up 6 to 8 months Dennie Bible, [facility], [facility], APRN  [name] [address], [place], [place] [address] [phone]

## 2018-05-26 ENCOUNTER — Encounter: Payer: Self-pay | Admitting: Nurse Practitioner

## 2018-05-26 ENCOUNTER — Ambulatory Visit (INDEPENDENT_AMBULATORY_CARE_PROVIDER_SITE_OTHER): Payer: Medicare HMO | Admitting: Nurse Practitioner

## 2018-05-26 VITALS — BP 105/66 | HR 73 | Ht 74.0 in

## 2018-05-26 DIAGNOSIS — G40909 Epilepsy, unspecified, not intractable, without status epilepticus: Secondary | ICD-10-CM

## 2018-05-26 DIAGNOSIS — G20 Parkinson's disease: Secondary | ICD-10-CM | POA: Diagnosis not present

## 2018-05-26 DIAGNOSIS — R269 Unspecified abnormalities of gait and mobility: Secondary | ICD-10-CM

## 2018-05-26 NOTE — Patient Instructions (Signed)
Per skilled sheet 

## 2018-05-27 NOTE — Progress Notes (Signed)
I have reviewed and agreed above plan. 

## 2018-06-25 ENCOUNTER — Inpatient Hospital Stay (HOSPITAL_COMMUNITY)
Admission: EM | Admit: 2018-06-25 | Discharge: 2018-07-01 | DRG: 641 | Disposition: A | Discharge status: Skilled Nursing Facility | Payer: Medicare HMO | Source: Skilled Nursing Facility | Attending: Internal Medicine | Admitting: Internal Medicine

## 2018-06-25 ENCOUNTER — Encounter (HOSPITAL_COMMUNITY): Payer: Self-pay | Admitting: Emergency Medicine

## 2018-06-25 ENCOUNTER — Other Ambulatory Visit: Payer: Self-pay

## 2018-06-25 ENCOUNTER — Emergency Department (HOSPITAL_COMMUNITY): Payer: Medicare HMO

## 2018-06-25 DIAGNOSIS — M109 Gout, unspecified: Secondary | ICD-10-CM | POA: Diagnosis present

## 2018-06-25 DIAGNOSIS — G20A1 Parkinson's disease without dyskinesia, without mention of fluctuations: Secondary | ICD-10-CM

## 2018-06-25 DIAGNOSIS — R569 Unspecified convulsions: Secondary | ICD-10-CM | POA: Diagnosis not present

## 2018-06-25 DIAGNOSIS — Z7189 Other specified counseling: Secondary | ICD-10-CM

## 2018-06-25 DIAGNOSIS — G20 Parkinson's disease: Secondary | ICD-10-CM | POA: Diagnosis present

## 2018-06-25 DIAGNOSIS — E785 Hyperlipidemia, unspecified: Secondary | ICD-10-CM | POA: Diagnosis present

## 2018-06-25 DIAGNOSIS — Z8249 Family history of ischemic heart disease and other diseases of the circulatory system: Secondary | ICD-10-CM

## 2018-06-25 DIAGNOSIS — G894 Chronic pain syndrome: Secondary | ICD-10-CM | POA: Diagnosis present

## 2018-06-25 DIAGNOSIS — Z7401 Bed confinement status: Secondary | ICD-10-CM

## 2018-06-25 DIAGNOSIS — Z87442 Personal history of urinary calculi: Secondary | ICD-10-CM | POA: Diagnosis not present

## 2018-06-25 DIAGNOSIS — R627 Adult failure to thrive: Secondary | ICD-10-CM | POA: Diagnosis not present

## 2018-06-25 DIAGNOSIS — I2510 Atherosclerotic heart disease of native coronary artery without angina pectoris: Secondary | ICD-10-CM | POA: Diagnosis present

## 2018-06-25 DIAGNOSIS — Z881 Allergy status to other antibiotic agents status: Secondary | ICD-10-CM

## 2018-06-25 DIAGNOSIS — N179 Acute kidney failure, unspecified: Secondary | ICD-10-CM

## 2018-06-25 DIAGNOSIS — E870 Hyperosmolality and hypernatremia: Secondary | ICD-10-CM | POA: Diagnosis present

## 2018-06-25 DIAGNOSIS — I129 Hypertensive chronic kidney disease with stage 1 through stage 4 chronic kidney disease, or unspecified chronic kidney disease: Secondary | ICD-10-CM | POA: Diagnosis present

## 2018-06-25 DIAGNOSIS — Z7901 Long term (current) use of anticoagulants: Secondary | ICD-10-CM | POA: Diagnosis not present

## 2018-06-25 DIAGNOSIS — R319 Hematuria, unspecified: Secondary | ICD-10-CM | POA: Diagnosis present

## 2018-06-25 DIAGNOSIS — I252 Old myocardial infarction: Secondary | ICD-10-CM | POA: Diagnosis not present

## 2018-06-25 DIAGNOSIS — R1319 Other dysphagia: Secondary | ICD-10-CM

## 2018-06-25 DIAGNOSIS — N183 Chronic kidney disease, stage 3 unspecified: Secondary | ICD-10-CM

## 2018-06-25 DIAGNOSIS — B964 Proteus (mirabilis) (morganii) as the cause of diseases classified elsewhere: Secondary | ICD-10-CM | POA: Diagnosis present

## 2018-06-25 DIAGNOSIS — Z888 Allergy status to other drugs, medicaments and biological substances status: Secondary | ICD-10-CM

## 2018-06-25 DIAGNOSIS — L89151 Pressure ulcer of sacral region, stage 1: Secondary | ICD-10-CM | POA: Diagnosis present

## 2018-06-25 DIAGNOSIS — G4730 Sleep apnea, unspecified: Secondary | ICD-10-CM | POA: Diagnosis present

## 2018-06-25 DIAGNOSIS — Z66 Do not resuscitate: Secondary | ICD-10-CM | POA: Diagnosis present

## 2018-06-25 DIAGNOSIS — K219 Gastro-esophageal reflux disease without esophagitis: Secondary | ICD-10-CM | POA: Diagnosis present

## 2018-06-25 DIAGNOSIS — Z1623 Resistance to quinolones and fluoroquinolones: Secondary | ICD-10-CM | POA: Diagnosis present

## 2018-06-25 DIAGNOSIS — Z515 Encounter for palliative care: Secondary | ICD-10-CM

## 2018-06-25 DIAGNOSIS — E876 Hypokalemia: Secondary | ICD-10-CM | POA: Diagnosis present

## 2018-06-25 DIAGNOSIS — I480 Paroxysmal atrial fibrillation: Secondary | ICD-10-CM | POA: Diagnosis present

## 2018-06-25 DIAGNOSIS — I10 Essential (primary) hypertension: Secondary | ICD-10-CM | POA: Diagnosis present

## 2018-06-25 DIAGNOSIS — Z955 Presence of coronary angioplasty implant and graft: Secondary | ICD-10-CM

## 2018-06-25 DIAGNOSIS — R401 Stupor: Secondary | ICD-10-CM

## 2018-06-25 DIAGNOSIS — F0280 Dementia in other diseases classified elsewhere without behavioral disturbance: Secondary | ICD-10-CM | POA: Diagnosis present

## 2018-06-25 DIAGNOSIS — G40909 Epilepsy, unspecified, not intractable, without status epilepticus: Secondary | ICD-10-CM | POA: Diagnosis present

## 2018-06-25 DIAGNOSIS — Z85828 Personal history of other malignant neoplasm of skin: Secondary | ICD-10-CM

## 2018-06-25 DIAGNOSIS — E860 Dehydration: Secondary | ICD-10-CM | POA: Diagnosis present

## 2018-06-25 DIAGNOSIS — R4702 Dysphasia: Secondary | ICD-10-CM | POA: Diagnosis present

## 2018-06-25 DIAGNOSIS — N390 Urinary tract infection, site not specified: Secondary | ICD-10-CM

## 2018-06-25 DIAGNOSIS — M1990 Unspecified osteoarthritis, unspecified site: Secondary | ICD-10-CM | POA: Diagnosis present

## 2018-06-25 LAB — COMPREHENSIVE METABOLIC PANEL
ALBUMIN: 3.3 g/dL — AB (ref 3.5–5.0)
ALK PHOS: 60 U/L (ref 38–126)
ALK PHOS: 57 U/L (ref 38–126)
ALT: 17 U/L (ref 0–44)
ALT: 17 U/L (ref 0–44)
AST: 17 U/L (ref 15–41)
AST: 14 U/L — AB (ref 15–41)
Albumin: 3.4 g/dL — ABNORMAL LOW (ref 3.5–5.0)
Anion gap: 9 (ref 5–15)
Anion gap: 11 (ref 5–15)
BILIRUBIN TOTAL: 1 mg/dL (ref 0.3–1.2)
BILIRUBIN TOTAL: 1 mg/dL (ref 0.3–1.2)
BUN: 45 mg/dL — ABNORMAL HIGH (ref 8–23)
BUN: 45 mg/dL — AB (ref 8–23)
CALCIUM: 8.8 mg/dL — AB (ref 8.9–10.3)
CALCIUM: 8.6 mg/dL — AB (ref 8.9–10.3)
CO2: 27 mmol/L (ref 22–32)
CO2: 26 mmol/L (ref 22–32)
CREATININE: 1.98 mg/dL — AB (ref 0.61–1.24)
Chloride: 115 mmol/L — ABNORMAL HIGH (ref 98–111)
Chloride: 117 mmol/L — ABNORMAL HIGH (ref 98–111)
Creatinine, Ser: 2.13 mg/dL — ABNORMAL HIGH (ref 0.61–1.24)
GFR calc Af Amer: 37 mL/min — ABNORMAL LOW (ref >60)
GFR, EST AFRICAN AMERICAN: 34 mL/min — AB (ref >60)
GFR, EST NON AFRICAN AMERICAN: 29 mL/min — AB (ref >60)
GFR, EST NON AFRICAN AMERICAN: 32 mL/min — AB (ref >60)
GLUCOSE: 124 mg/dL — AB (ref 70–99)
GLUCOSE: 133 mg/dL — AB (ref 70–99)
Potassium: 3.8 mmol/L (ref 3.5–5.1)
Potassium: 3.6 mmol/L (ref 3.5–5.1)
Sodium: 151 mmol/L — ABNORMAL HIGH (ref 135–145)
Sodium: 154 mmol/L — ABNORMAL HIGH (ref 135–145)
TOTAL PROTEIN: 7.5 g/dL (ref 6.5–8.1)
Total Protein: 8.1 g/dL (ref 6.5–8.1)

## 2018-06-25 LAB — URINALYSIS, ROUTINE W REFLEX MICROSCOPIC
BILIRUBIN URINE: NEGATIVE
GLUCOSE, UA: NEGATIVE mg/dL
KETONES UR: NEGATIVE mg/dL
Nitrite: NEGATIVE
PH: 7 (ref 5.0–8.0)
Protein, ur: 30 mg/dL — AB
Specific Gravity, Urine: 1.019 (ref 1.005–1.030)
WBC, UA: >50 WBC/hpf — ABNORMAL HIGH (ref 0–5)

## 2018-06-25 LAB — CBC WITH DIFFERENTIAL/PLATELET
Abs Immature Granulocytes: 0.08 10*3/uL — ABNORMAL HIGH (ref 0.00–0.07)
Basophils Absolute: 0.1 10*3/uL (ref 0.0–0.1)
Basophils Relative: 0 %
EOS ABS: 0 10*3/uL (ref 0.0–0.5)
Eosinophils Relative: 0 %
HEMATOCRIT: 41.1 % (ref 39.0–52.0)
Hemoglobin: 12.4 g/dL — ABNORMAL LOW (ref 13.0–17.0)
IMMATURE GRANULOCYTES: 1 %
LYMPHS ABS: 2.1 10*3/uL (ref 0.7–4.0)
Lymphocytes Relative: 15 %
MCH: 30.3 pg (ref 26.0–34.0)
MCHC: 30.2 g/dL (ref 30.0–36.0)
MCV: 100.5 fL — AB (ref 80.0–100.0)
MONOS PCT: 13 %
Monocytes Absolute: 1.8 10*3/uL — ABNORMAL HIGH (ref 0.1–1.0)
NEUTROS ABS: 10.3 10*3/uL — AB (ref 1.7–7.7)
NEUTROS PCT: 71 %
PLATELETS: 355 10*3/uL (ref 150–400)
RBC: 4.09 MIL/uL — ABNORMAL LOW (ref 4.22–5.81)
RDW: 14.2 % (ref 11.5–15.5)
WBC: 14.3 10*3/uL — ABNORMAL HIGH (ref 4.0–10.5)
nRBC: 0 % (ref 0.0–0.2)

## 2018-06-25 LAB — CBC
HEMATOCRIT: 39.6 % (ref 39.0–52.0)
HEMOGLOBIN: 12.2 g/dL — AB (ref 13.0–17.0)
MCH: 31.6 pg (ref 26.0–34.0)
MCHC: 30.8 g/dL (ref 30.0–36.0)
MCV: 102.6 fL — AB (ref 80.0–100.0)
NRBC: 0 % (ref 0.0–0.2)
Platelets: 331 10*3/uL (ref 150–400)
RBC: 3.86 MIL/uL — ABNORMAL LOW (ref 4.22–5.81)
RDW: 14.1 % (ref 11.5–15.5)
WBC: 15.6 10*3/uL — AB (ref 4.0–10.5)

## 2018-06-25 LAB — LACTIC ACID, PLASMA
Lactic Acid, Venous: 1.6 mmol/L (ref 0.5–1.9)
Lactic Acid, Venous: 1.3 mmol/L (ref 0.5–1.9)

## 2018-06-25 LAB — MRSA PCR SCREENING: MRSA by PCR: NEGATIVE

## 2018-06-25 MED ORDER — VALPROATE SODIUM 500 MG/5ML IV SOLN
500.0000 mg | Freq: Two times a day (BID) | INTRAVENOUS | Status: DC
  Administered 2018-06-25 – 2018-07-01 (×12): 500 mg via INTRAVENOUS
  Filled 2018-06-25 (×19): qty 5

## 2018-06-25 MED ORDER — QUETIAPINE FUMARATE 25 MG PO TABS: 50.0000 mg | ORAL_TABLET | Freq: Every day | ORAL | Status: DC

## 2018-06-25 MED ORDER — MORPHINE SULFATE (PF) 2 MG/ML IV SOLN
0.5000 mg | Freq: Four times a day (QID) | INTRAVENOUS | Status: DC | PRN
  Administered 2018-06-25 – 2018-06-28 (×2): 0.5 mg via INTRAVENOUS
  Filled 2018-06-25 (×2): qty 1

## 2018-06-25 MED ORDER — CARVEDILOL 3.125 MG PO TABS
3.1250 mg | ORAL_TABLET | Freq: Two times a day (BID) | ORAL | Status: DC
  Filled 2018-06-25 (×5): qty 1

## 2018-06-25 MED ORDER — ACETAMINOPHEN 325 MG PO TABS: 650.0000 mg | ORAL_TABLET | Freq: Four times a day (QID) | ORAL | Status: DC | PRN

## 2018-06-25 MED ORDER — IPRATROPIUM-ALBUTEROL 0.5-2.5 (3) MG/3ML IN SOLN: 3.0000 mL | Freq: Four times a day (QID) | RESPIRATORY_TRACT | Status: DC | PRN

## 2018-06-25 MED ORDER — SODIUM CHLORIDE 0.9 % IV SOLN
2.0000 g | Freq: Once | INTRAVENOUS | Status: AC
  Administered 2018-06-25: 2 g via INTRAVENOUS
  Filled 2018-06-25: qty 2

## 2018-06-25 MED ORDER — SODIUM CHLORIDE 0.45 % IV SOLN
INTRAVENOUS | Status: DC
  Administered 2018-06-25: 04:00:00 via INTRAVENOUS

## 2018-06-25 MED ORDER — CLONIDINE HCL 0.1 MG PO TABS: 0.1000 mg | ORAL_TABLET | ORAL | Status: DC | PRN

## 2018-06-25 MED ORDER — DULOXETINE HCL 20 MG PO CPEP
20.0000 mg | ORAL_CAPSULE | Freq: Two times a day (BID) | ORAL | Status: DC
  Administered 2018-06-29 – 2018-07-01 (×4): 20 mg via ORAL
  Filled 2018-06-25 (×7): qty 1

## 2018-06-25 MED ORDER — APIXABAN 5 MG PO TABS
5.0000 mg | ORAL_TABLET | Freq: Two times a day (BID) | ORAL | Status: DC
  Filled 2018-06-25 (×2): qty 1

## 2018-06-25 MED ORDER — ACETAMINOPHEN 650 MG RE SUPP
650.0000 mg | Freq: Four times a day (QID) | RECTAL | Status: DC | PRN
  Administered 2018-06-25 – 2018-06-26 (×2): 650 mg via RECTAL
  Filled 2018-06-25 (×2): qty 1

## 2018-06-25 MED ORDER — DONEPEZIL HCL 5 MG PO TABS
5.0000 mg | ORAL_TABLET | Freq: Every day | ORAL | Status: DC
  Filled 2018-06-25 (×2): qty 1

## 2018-06-25 MED ORDER — CARBIDOPA-LEVODOPA 25-100 MG PO TABS
1.0000 | ORAL_TABLET | Freq: Three times a day (TID) | ORAL | Status: DC
  Filled 2018-06-25 (×9): qty 1

## 2018-06-25 MED ORDER — OXYMETAZOLINE HCL 0.05 % NA SOLN: 2.0000 | NASAL | Status: DC | PRN

## 2018-06-25 MED ORDER — SODIUM CHLORIDE 0.45 % IV SOLN
INTRAVENOUS | Status: DC
  Administered 2018-06-25 – 2018-06-27 (×3): via INTRAVENOUS

## 2018-06-25 MED ORDER — METOPROLOL TARTRATE 5 MG/5ML IV SOLN
2.5000 mg | Freq: Two times a day (BID) | INTRAVENOUS | Status: DC
  Administered 2018-06-25 – 2018-06-30 (×9): 2.5 mg via INTRAVENOUS
  Filled 2018-06-25 (×9): qty 5

## 2018-06-25 MED ORDER — CLONIDINE HCL 0.1 MG/24HR TD PTWK
0.1000 mg | MEDICATED_PATCH | TRANSDERMAL | Status: DC
  Administered 2018-06-30: 0.1 mg via TRANSDERMAL
  Filled 2018-06-25 (×2): qty 1

## 2018-06-25 MED ORDER — DIVALPROEX SODIUM 250 MG PO DR TAB
500.0000 mg | DELAYED_RELEASE_TABLET | Freq: Two times a day (BID) | ORAL | Status: DC
  Filled 2018-06-25: qty 2

## 2018-06-25 MED ORDER — SODIUM CHLORIDE 0.9 % IV BOLUS
1000.0000 mL | Freq: Once | INTRAVENOUS | Status: AC
  Administered 2018-06-25: 1000 mL via INTRAVENOUS

## 2018-06-25 MED ORDER — SODIUM CHLORIDE 0.9 % IV SOLN
1.0000 g | Freq: Three times a day (TID) | INTRAVENOUS | Status: DC
  Administered 2018-06-25 – 2018-06-27 (×6): 1 g via INTRAVENOUS
  Filled 2018-06-25 (×10): qty 1

## 2018-06-25 MED ORDER — SERTRALINE HCL 50 MG PO TABS
100.0000 mg | ORAL_TABLET | Freq: Every day | ORAL | Status: DC
  Administered 2018-06-30 – 2018-07-01 (×2): 100 mg via ORAL
  Filled 2018-06-25 (×5): qty 2

## 2018-06-25 MED ORDER — BISACODYL 10 MG RE SUPP: 10.0000 mg | RECTAL | Status: DC | PRN

## 2018-06-25 MED ORDER — GABAPENTIN 100 MG PO CAPS
100.0000 mg | ORAL_CAPSULE | Freq: Three times a day (TID) | ORAL | Status: DC
  Administered 2018-06-30: 100 mg via ORAL
  Filled 2018-06-25 (×5): qty 1

## 2018-06-25 NOTE — ED Triage Notes (Signed)
Wife states symptoms x 3 weeks only difference today was that he looked worse to her. Mouth breathing noted.

## 2018-06-25 NOTE — Progress Notes (Signed)
Patient noted to be flushed, warm to touch, tylenol given. Temperature 99.7. Will reassess.

## 2018-06-25 NOTE — Progress Notes (Signed)
Spoke with midlevel regarding blood in urine, order for indwelling catheter to be placed and to irrigate. Foley place without difficulty 200 urine returned.

## 2018-06-25 NOTE — Progress Notes (Signed)
Pharmacy Antibiotic Note  Mark [name] is a 72 y.o. male admitted on 06/25/2018 with UTI.  Pharmacy has been consulted for Aztreonam dosing.  Plan: Aztreonam 2gm IV in ED, then 1gm IV q8h F/U cxs and clinical progress Monitor V/S and labs  Weight: 275 lb (124.7 kg)  Temp (24hrs), Avg:98.9 F (37.2 C), Min:98.1 F (36.7 C), Max:99.7 F (37.6 C)  Recent Labs  Lab 06/25/18 0206 06/25/18 0207 06/25/18 0433  WBC 14.3*  --  15.6*  CREATININE 2.13*  --  1.98*  LATICACIDVEN  --  1.6 1.3    Estimated Creatinine Clearance: 47.3 mL/min (A) (by C-G formula based on SCr of 1.98 mg/dL (H)).    Allergies  Allergen Reactions  . Keppra [Levetiracetam] Other (See Comments)    Increased agitation  . Rocephin [Ceftriaxone Sodium In Dextrose] Other (See Comments)    Unknown reaction  . Ciprofloxacin Rash    Antimicrobials this admission: Aztreonam 10/19 >>   Dose adjustments this admission: N/A  Microbiology results: 10/19  UCx: pending   Thank you for allowing pharmacy to be a part of this patient's care.  Isac Sarna, BS Pharm D, [place] Clinical Pharmacist Pager [phone] 06/25/2018 11:04 AM

## 2018-06-25 NOTE — ED Notes (Signed)
500 ns bag from EMS hanging WO

## 2018-06-25 NOTE — ED Notes (Signed)
Dr Dyann Kief aware of patient's inability to swallow. Meds orders to be changed. Swallowing study to be done and Dr Dyann Kief to come and speak to family about possible peg tube placement if difficulty with swallowing continues. Family aware.

## 2018-06-25 NOTE — ED Triage Notes (Signed)
Pt from [facility]. Arrived by ems with wife. Pt alert/not talking. Mouth dry. Wife states pt usually talks some but not talking. Wife states starting sleeping more and and eating less 3 weeks.

## 2018-06-25 NOTE — ED Notes (Signed)
Going KVO with Bolus until first bolus done

## 2018-06-25 NOTE — Progress Notes (Signed)
Paged the midlevel because patient has blood in his urine.

## 2018-06-25 NOTE — ED Provider Notes (Signed)
[facility] EMERGENCY DEPARTMENT Provider Note   CSN: [id number] Arrival date & time: 06/25/18  0104  Time seen 01:40 AM  History   Chief Complaint Chief Complaint  Patient presents with  . Fatigue  . Altered Mental Status   Level 5 caveat for altered mental status  HPI Mark [name] is a 72 y.o. male.  HPI wife states patient has Parkinson's disease and has been in a nursing facility for the past 6 years.  She states he has been bedridden for the past 3 weeks.  Before that he would sit in a shower chair for shower and then they put him in a shower bed and in the last 3 weeks he has not had any showers and is been having bed baths.  He has had decreased appetite and the last few days has refused his medications.  She states all he does is sleep all the time.  He states he does talk but is not speaking as much now.  She thinks he may be in pain.  She states he wears a diaper and he has been wetting his diapers.  He has not had coughing, vomiting, or diarrhea.  He has not been seen by the facility doctor until today when she knew he was in the facility and made him come see her husband.  She states he has not had any of his medicines in the past few days.  She states this is never happened before.  PCP Caprice Renshaw, MD   Past Medical History:  Diagnosis Date  . Arthritis   . Bacteremia due to Escherichia coli 06/21/2012  . Cervical vertebral fracture ([place])   . Coronary artery disease    SEVERE 3-VESSEL DX WITH NORMAL EF  . Fall   . Fracture of right olecranon process 10/23/2011  . Gout   . Hypertension   . Kidney stones   . MI (myocardial infarction) ([place]) 08/2011   /H&P (05/12/2012)  . Non-ST elevated myocardial infarction (non-STEMI) ([place])   . PAF (paroxysmal atrial fibrillation) ([employer])   . Parkinson's disease   . 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 Adenoma.  . Pyelonephritis, acute 06/23/2012  . Seizures ([place]) 12/10/2011   HAD SEIZURE  . Skin cancer    left shoulder  . Sleep apnea      Patient Active Problem List   Diagnosis Date Noted  . Gait abnormality 05/26/2018  . Left arm swelling 06/15/2016  . 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 Psychosis ([place]) 01/24/2016  . Hallucinations   . Chest pain 08/22/2014  . Coronary artery disease involving native coronary artery of native heart with other form of angina pectoris ([place])   . Essential hypertension   . Hyperlipidemia   . Paroxysmal atrial fibrillation ([facility])   . Colon cancer screening 01/03/2014  . Elevated LFTs 10/05/2013  . Liver lesion 06/23/2012  . Pyelonephritis, acute 06/23/2012  . Hypotension 06/21/2012  . Bacteremia due to Escherichia coli 06/21/2012  . Sepsis ([place]) 06/20/2012  . FTT (failure to thrive) in adult 06/19/2012  . ARF (acute renal failure) ([place]) 06/19/2012  . UTI (lower urinary tract infection) 06/19/2012  . Dehydration 06/19/2012  . Frequent falls 06/19/2012  . Hypokalemia 06/19/2012  . Anemia 06/19/2012  . Seizure disorder ([place]) 06/19/2012  . Fracture of right olecranon process 10/23/2011  . Olecranon fracture, recurrent 10/19/2011  . Fall 08/13/2011  . CAD (coronary artery disease) 07/14/2011  . HTN (hypertension) 07/14/2011  . Parkinson disease ([place]) 07/14/2011    Past  Surgical History:  Procedure Laterality Date  . CARDIAC CATHETERIZATION  06/26/2011   NORMAL LV SYSTOLIC FUNCTION. RCA IS LARGE AND DOMINANT  . CORONARY ANGIOPLASTY WITH STENT PLACEMENT  ~ 2000   "one that I know of"  . HARDWARE REMOVAL  12/29/2011   Procedure: HARDWARE REMOVAL;  Surgeon: Johnny Bridge, MD;  Location: [facility];  Service: Orthopedics;  Laterality: Right;  . ORIF ELBOW FRACTURE  12/29/2011   Procedure: OPEN REDUCTION INTERNAL FIXATION (ORIF) ELBOW/OLECRANON FRACTURE;  Surgeon: Johnny Bridge, MD;  Location: [facility];  Service: Orthopedics;  Laterality: Right;  . PAROTIDECTOMY  05/12/2012   right  . PAROTIDECTOMY  05/12/2012   Procedure: PAROTIDECTOMY;  Surgeon: Rozetta Nunnery, MD;  Location:  [facility];  Service: ENT;  Laterality: Right;  PAROTIDECTOMY MASS  . SKIN CANCER EXCISION  2013   left shoulder  . SKIN CANCER EXCISION     right ear  . TRANSTHORACIC ECHOCARDIOGRAM  06/26/2011   EF 55-60%        Home Medications    Prior to Admission medications   Medication Sig Start Date End Date Taking? Authorizing Provider  apixaban (ELIQUIS) 5 MG TABS tablet Take 5 mg by mouth 2 (two) times daily.    [provider]  atorvastatin (LIPITOR) 40 MG tablet Take 40 mg by mouth daily.    [provider]  bisacodyl (DULCOLAX) 10 MG suppository Place 10 mg rectally as needed for moderate constipation.    [provider]  carbidopa-levodopa (SINEMET IR) 25-100 MG tablet Take by mouth 3 (three) times daily.    [provider]  carvedilol (COREG) 3.125 MG tablet Take 1 tablet (3.125 mg total) by mouth 2 (two) times daily with a meal. 04/05/12   Nahser, Wonda Cheng, MD  Cholecalciferol (VITAMIN D) 2000 UNITS tablet Take 2,000 Units by mouth daily.    [provider]  cloNIDine (CATAPRES - DOSED IN MG/24 HR) 0.1 mg/24hr patch Place 0.1 mg onto the skin once a week.    [provider]  cloNIDine (CATAPRES) 0.1 MG tablet Take 0.1 mg by mouth as needed. Give if BP is greater than 160/90.    [provider]  colchicine 0.6 MG tablet Take 0.6 mg by mouth daily.    [provider]  diphenhydrAMINE (BENADRYL) 25 MG tablet Take 25 mg by mouth every 8 (eight) hours as needed.    [provider]  divalproex (DEPAKOTE) 500 MG DR tablet Take 1 tablet (500 mg total) by mouth 2 (two) times daily. 10/11/17   Marcial Pacas, MD  donepezil (ARICEPT) 5 MG tablet Take 5 mg by mouth at bedtime.    [provider]  DULoxetine (CYMBALTA) 20 MG capsule Take 20 mg by mouth 2 (two) times daily.    [provider]  famotidine (PEPCID) 20 MG tablet Take 20 mg by mouth as needed for heartburn or indigestion.    [provider]    ferrous sulfate 325 (65 FE) MG tablet Take 325 mg by mouth daily.    [provider]  furosemide (LASIX) 20 MG tablet Take 20 mg by mouth daily.     [provider]  furosemide (LASIX) 40 MG tablet Take 40 mg by mouth as needed. Every 8 hr for swelling prn    [provider]  gabapentin (NEURONTIN) 100 MG capsule Take 100 mg by mouth 3 (three) times daily.    [provider]  guaiFENesin (MUCINEX) 600 MG 12 hr tablet Take  600 mg by mouth every 12 (twelve) hours as needed (congestion).    [provider]  guaifenesin (ROBITUSSIN) 100 MG/5ML syrup Take 200 mg by mouth as needed for cough. Take 56ml q6h as needed.    [provider]  ipratropium-albuterol (DUONEB) 0.5-2.5 (3) MG/3ML SOLN Take 3 mLs by nebulization every 6 (six) hours as needed (SHORTNESS OF BREATH).     [provider]  lisinopril (PRINIVIL,ZESTRIL) 5 MG tablet Take 5 mg by mouth daily.    [provider]  LORazepam (ATIVAN) 0.5 MG tablet Take 0.5 mg by mouth every 8 (eight) hours.    [provider]  Melatonin 5 MG CAPS Take by mouth as needed.    [provider]  nitroGLYCERIN (NITROSTAT) 0.4 MG SL tablet Place 0.4 mg under the tongue every 5 (five) minutes as needed for chest pain.    [provider]  ondansetron (ZOFRAN) 4 MG tablet Take 4 mg by mouth every 8 (eight) hours as needed for nausea or vomiting.    [provider]  oxyCODONE (OXY IR/ROXICODONE) 5 MG immediate release tablet Take 5 mg by mouth every 8 (eight) hours.    [provider]  oxymetazoline (AFRIN) 0.05 % nasal spray Place 2 sprays into both nostrils as needed (nosebleeds).     [provider]  polyethylene glycol (MIRALAX / GLYCOLAX) packet Take 17 g by mouth daily.    [provider]  Propylene Glycol (SYSTANE BALANCE) 0.6 % SOLN Apply 2 drops to eye at bedtime as needed (DRY EYES).    [provider]  QUEtiapine  (SEROQUEL) 25 MG tablet Take 50 mg by mouth at bedtime.    [provider]  senna-docusate (SENOKOT-S) 8.6-50 MG per tablet Take 2 tablets by mouth 2 (two) times daily.     [provider]  sertraline (ZOLOFT) 25 MG tablet Take 100 mg by mouth daily.     [provider]  vitamin B-12 (CYANOCOBALAMIN) 100 MCG tablet Take 100 mcg by mouth daily.    [provider]  vitamin C (ASCORBIC ACID) 500 MG tablet Take 500 mg by mouth 2 (two) times daily.    [provider]    Family History Family History  Problem Relation Age of Onset  . Heart attack Father   . Heart disease Unknown   . Arthritis Unknown   . Colon cancer Neg Hx   . Liver disease Neg Hx     Social History Social History   Tobacco Use  . Smoking status: Never Smoker  . Smokeless tobacco: Never Used  Substance Use Topics  . Alcohol use: No    Alcohol/week: 0.0 standard drinks  . Drug use: No  nonambulatory Bed ridden   Allergies   Keppra [levetiracetam]; Rocephin [ceftriaxone sodium in dextrose]; and Ciprofloxacin   Review of Systems Review of Systems  Unable to perform ROS: Mental status change     Physical Exam Updated Vital Signs BP (!) 131/59   Pulse 72   Temp 99.7 F (37.6 C) (Rectal)   Resp 20   SpO2 98%   Vital signs normal    Physical Exam  Constitutional: He is oriented to person, place, and time. He appears well-developed and well-nourished.  Non-toxic appearance. He does not appear ill.  HENT:  Head: Normocephalic and atraumatic.  Right Ear: External ear normal.  Left Ear: External ear normal.  Nose: Nose normal. No mucosal edema or rhinorrhea.  Mouth/Throat: Mucous membranes are dry. No dental abscesses  or uvula swelling.  Patient has flat feces with minimal expression, when I talked to him he does attempt to talk his speech is hard to understand but his wife interprets for me.  Eyes: Pupils are equal, round, and reactive to light. Conjunctivae  and EOM are normal.  Neck: Normal range of motion and full passive range of motion without pain. Neck supple.  Cardiovascular: Normal rate, regular rhythm and normal heart sounds. Exam reveals no gallop and no friction rub.  No murmur heard. Pulmonary/Chest: Effort normal and breath sounds normal. No respiratory distress. He has no wheezes. He has no rhonchi. He has no rales. He exhibits no tenderness and no crepitus.  Abdominal: Soft. Normal appearance and bowel sounds are normal. He exhibits no distension. There is no tenderness. There is no rebound and no guarding.  Musculoskeletal: Normal range of motion. He exhibits no edema or tenderness.  Moves all extremities well.   Neurological: He is alert and oriented to person, place, and time. He has normal strength. No cranial nerve deficit.  Skin: Skin is warm, dry and intact. No rash noted. No erythema. No pallor.  Psychiatric: He has a normal mood and affect. His speech is normal and behavior is normal. His mood appears not anxious.  Nursing note and vitals reviewed.    ED Treatments / Results  Labs (all labs ordered are listed, but only abnormal results are displayed) Results for orders placed or performed during the hospital encounter of 06/25/18  Comprehensive metabolic panel  Result Value Ref Range   Sodium 151 (H) 135 - 145 mmol/L   Potassium 3.8 3.5 - 5.1 mmol/L   Chloride 115 (H) 98 - 111 mmol/L   CO2 27 22 - 32 mmol/L   Glucose, Bld 124 (H) 70 - 99 mg/dL   BUN 45 (H) 8 - 23 mg/dL   Creatinine, Ser 2.13 (H) 0.61 - 1.24 mg/dL   Calcium 8.8 (L) 8.9 - 10.3 mg/dL   Total Protein 8.1 6.5 - 8.1 g/dL   Albumin 3.4 (L) 3.5 - 5.0 g/dL   AST 17 15 - 41 U/L   ALT 17 0 - 44 U/L   Alkaline Phosphatase 60 38 - 126 U/L   Total Bilirubin 1.0 0.3 - 1.2 mg/dL   GFR calc non Af Amer 29 (L) >60 mL/min   GFR calc Af Amer 34 (L) >60 mL/min   Anion gap 9 5 - 15  CBC with Differential  Result Value Ref Range   WBC 14.3 (H) 4.0 - 10.5 K/uL    RBC 4.09 (L) 4.22 - 5.81 MIL/uL   Hemoglobin 12.4 (L) 13.0 - 17.0 g/dL   HCT 41.1 39.0 - 52.0 %   MCV 100.5 (H) 80.0 - 100.0 fL   MCH 30.3 26.0 - 34.0 pg   MCHC 30.2 30.0 - 36.0 g/dL   RDW 14.2 11.5 - 15.5 %   Platelets 355 150 - 400 K/uL   nRBC 0.0 0.0 - 0.2 %   Neutrophils Relative % 71 %   Neutro Abs 10.3 (H) 1.7 - 7.7 K/uL   Lymphocytes Relative 15 %   Lymphs Abs 2.1 0.7 - 4.0 K/uL   Monocytes Relative 13 %   Monocytes Absolute 1.8 (H) 0.1 - 1.0 K/uL   Eosinophils Relative 0 %   Eosinophils Absolute 0.0 0.0 - 0.5 K/uL   Basophils Relative 0 %   Basophils Absolute 0.1 0.0 - 0.1 K/uL   Immature Granulocytes 1 %   Abs Immature Granulocytes 0.08 (  H) 0.00 - 0.07 K/uL  Urinalysis, Routine w reflex microscopic  Result Value Ref Range   Color, Urine YELLOW YELLOW   APPearance HAZY (A) CLEAR   Specific Gravity, Urine 1.019 1.005 - 1.030   pH 7.0 5.0 - 8.0   Glucose, UA NEGATIVE NEGATIVE mg/dL   Hgb urine dipstick MODERATE (A) NEGATIVE   Bilirubin Urine NEGATIVE NEGATIVE   Ketones, ur NEGATIVE NEGATIVE mg/dL   Protein, ur 30 (A) NEGATIVE mg/dL   Nitrite NEGATIVE NEGATIVE   Leukocytes, UA LARGE (A) NEGATIVE   RBC / HPF >50 (H) 0 - 5 RBC/hpf   WBC, UA >50 (H) 0 - 5 WBC/hpf   Bacteria, UA RARE (A) NONE SEEN   Mucus PRESENT   Lactic acid, plasma  Result Value Ref Range   Lactic Acid, Venous 1.6 0.5 - 1.9 mmol/L   Laboratory interpretation all normal except hypernatremic, high chloride dehydration, acute renal injury compared to prior creatinines which were in the 1.2 range.  Possible UTI, urine culture sent.  Leukocytosis, some mild anemia compared to baseline hemoglobin of 13.    EKG EKG Interpretation  Date/Time:  Saturday June 25 2018 01:23:09 EDT Ventricular Rate:  76 PR Interval:    QRS Duration: 114 QT Interval:  500 QTC Calculation: 563 R Axis:   60 Text Interpretation:  Sinus rhythm Borderline intraventricular conduction delay Low voltage, precordial leads  Minimal ST elevation, lateral leads Prolonged QT interval Baseline wander in lead(s) I III aVL No significant change since last tracing 12 Mar 2015 Confirmed by Rolland Porter [phone]) on 06/25/2018 2:14:41 AM   Radiology Dg Chest Port 1 View  Result Date: 06/25/2018 CLINICAL DATA:  Altered mental status EXAM: PORTABLE CHEST 1 VIEW COMPARISON:  03/12/2015 FINDINGS: Low lung volumes. Borderline cardiomegaly with aortic atherosclerosis. No focal opacity or pleural effusion. No pneumothorax. IMPRESSION: No active disease.  Borderline cardiomegaly Electronically Signed   By: Donavan Foil M.D.   On: 06/25/2018 02:10    Procedures .Critical Care Performed by: Rolland Porter, MD Authorized by: Rolland Porter, MD   Critical care provider statement:    Critical care time (minutes):  39   Critical care was necessary to treat or prevent imminent or life-threatening deterioration of the following conditions:  Dehydration   Critical care was time spent personally by me on the following activities:  Discussions with consultants, examination of patient, obtaining history from patient or surrogate, ordering and review of laboratory studies, ordering and review of radiographic studies, re-evaluation of patient's condition and review of old charts   (including critical care time)  Medications Ordered in ED Medications  0.45 % sodium chloride infusion (has no administration in time range)  aztreonam (AZACTAM) 2 g in sodium chloride 0.9 % 100 mL IVPB (has no administration in time range)  sodium chloride 0.9 % bolus 1,000 mL (1,000 mLs Intravenous New Bag/Given 06/25/18 0214)  sodium chloride 0.9 % bolus 1,000 mL (1,000 mLs Intravenous New Bag/Given 06/25/18 0214)     Initial Impression / Assessment and Plan / ED Course  I have reviewed the triage vital signs and the nursing notes.  Pertinent labs & imaging results that were available during my care of the patient were reviewed by me and considered in my medical  decision making (see chart for details).  Patient appeared to be very dehydrated, he was given IV normal saline bolus x2.  Laboratory testing was ordered including portable chest x-ray.  At this point patient is trying to communicate with me so  head CT was delayed.  After reviewing patient's laboratory test results he possibly has a urinary tract infection.  Urine culture was obtained.  He was started on IV antibiotics.  After reviewing his serum sodium which is very elevated, his maintenance fluids were changed to 0.45 normal saline.  I have discussed his test results with the wife and she is agreeable for admission.    3:23 AM Dr. Darrick Meigs, hospitalist will admit  Final Clinical Impressions(s) / ED Diagnoses   Final diagnoses:  Stupor  Urinary tract infection with hematuria, site unspecified  Hypernatremia  Acute renal injury [facility])    Plan admission  Rolland Porter, MD, Barbette Or, MD 06/25/18 [phone]

## 2018-06-25 NOTE — ED Notes (Signed)
RN called [facility] to request Hospital Bed for Pt.

## 2018-06-25 NOTE — Progress Notes (Signed)
Patient seen and examined. Admitted after midnight secondary to dehydration and hypernatremia. Patient with advance parkinson's disease, non-verbal and for the last week having trouble with eating, drinking or taking PO meds. Hemodynamically stable, non-verbal but was able to partially squeeze my fingers and wiggle his toes on commands. Na level remains high (154 currently); unable to take any PO meds. Please refer to H7P written by Dr. Darrick Meigs for further info/details on admission.  Plan: -continue IVF's -follow electrolytes trend  -Continue IV antibiotics for suspected UTI and follow urine cx results.  -speech therapy evaluation -Palliative care consult.   Barton Dubois MD [phone]

## 2018-06-25 NOTE — ED Notes (Signed)
Dr Dyann Kief in ER to see patiet. Family away at this time to eat. Dr Dyann Kief assessed patient and will return to talk to family.

## 2018-06-25 NOTE — H&P (Signed)
TRH H&P    Patient Demographics:    Mark [name], is a 72 y.o. male  MRN: [id number]  DOB - 25-Dec-1945  Admit Date - 06/25/2018  Referring MD/NP/PA: Dr Tomi Bamberger  Outpatient Primary MD for the patient is Caprice Renshaw, MD  Patient coming from: SNF  Chief complaint-lethargy, poor p.o. intake   HPI:    Mark [name]  is a 72 y.o. male, with history of Parkinson disease, atrial fibrillation on chronic anticoagulation with Eliquis, seizure disorder, CAD status post stenting 2012, hypertension, stroke, hyperlipidemia who was brought to the hospital for poor p.o. intake and lethargy going on for past 3 weeks.  As per patient's wife patient has gradually become weak over the past 3 weeks usually he would sit and shower chair for shower and they put him in shower bed.  Patient has not been able to sit in shower chair and has been having bed baths.  Patient also had decreased appetite over the past few days refused his medications.  And has been sleepy. In the ED patient is nonverbal and unable to provide any history though he is alert. Lab work showed dehydration with creatinine 2.13, sodium 151.  Patient started on 0.45% normal saline at 125 mL/h. Also found to have abnormal UA started on IV Azactam for UTI. As per patient's wife There has been no nausea vomiting or diarrhea. Patient is on continuous pain regimen with Vicodin     Review of systems:    As in the HPI Rest of review of systems unobtainable   With Past History of the following :    Past Medical History:  Diagnosis Date  . Arthritis   . Bacteremia due to Escherichia coli 06/21/2012  . Cervical vertebral fracture ([place])   . Coronary artery disease    SEVERE 3-VESSEL DX WITH NORMAL EF  . Fall   . Fracture of right olecranon process 10/23/2011  . Gout   . Hypertension   . Kidney stones   . MI (myocardial infarction) ([place]) 08/2011   /H&P  (05/12/2012)  . Non-ST elevated myocardial infarction (non-STEMI) ([place])   . PAF (paroxysmal atrial fibrillation) ([place])   . Parkinson's disease   . 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 Adenoma.  . Pyelonephritis, acute 06/23/2012  . Seizures ([place]) 12/10/2011   HAD SEIZURE  . Skin cancer    left shoulder  . Sleep apnea       Past Surgical History:  Procedure Laterality Date  . CARDIAC CATHETERIZATION  06/26/2011   NORMAL LV SYSTOLIC FUNCTION. RCA IS LARGE AND DOMINANT  . CORONARY ANGIOPLASTY WITH STENT PLACEMENT  ~ 2000   "one that I know of"  . HARDWARE REMOVAL  12/29/2011   Procedure: HARDWARE REMOVAL;  Surgeon: Johnny Bridge, MD;  Location: [facility];  Service: Orthopedics;  Laterality: Right;  . ORIF ELBOW FRACTURE  12/29/2011   Procedure: OPEN REDUCTION INTERNAL FIXATION (ORIF) ELBOW/OLECRANON FRACTURE;  Surgeon: Johnny Bridge, MD;  Location: [facility];  Service: Orthopedics;  Laterality: Right;  . PAROTIDECTOMY  05/12/2012  right  . PAROTIDECTOMY  05/12/2012   Procedure: PAROTIDECTOMY;  Surgeon: Rozetta Nunnery, MD;  Location: [facility];  Service: ENT;  Laterality: Right;  PAROTIDECTOMY MASS  . SKIN CANCER EXCISION  2013   left shoulder  . SKIN CANCER EXCISION     right ear  . TRANSTHORACIC ECHOCARDIOGRAM  06/26/2011   EF 55-60%      Social History:      Social History   Tobacco Use  . Smoking status: Never Smoker  . Smokeless tobacco: Never Used  Substance Use Topics  . Alcohol use: No    Alcohol/week: 0.0 standard drinks       Family History :     Family History  Problem Relation Age of Onset  . Heart attack Father   . Heart disease Unknown   . Arthritis Unknown   . Colon cancer Neg Hx   . Liver disease Neg Hx       Home Medications:   Prior to Admission medications   Medication Sig Start Date End Date Taking? Authorizing Provider  apixaban (ELIQUIS) 5 MG TABS tablet Take 5 mg by mouth 2 (two) times daily.    [provider]  atorvastatin (LIPITOR) 40 MG  tablet Take 40 mg by mouth daily.    [provider]  bisacodyl (DULCOLAX) 10 MG suppository Place 10 mg rectally as needed for moderate constipation.    [provider]  carbidopa-levodopa (SINEMET IR) 25-100 MG tablet Take by mouth 3 (three) times daily.    [provider]  carvedilol (COREG) 3.125 MG tablet Take 1 tablet (3.125 mg total) by mouth 2 (two) times daily with a meal. 04/05/12   Nahser, Wonda Cheng, MD  Cholecalciferol (VITAMIN D) 2000 UNITS tablet Take 2,000 Units by mouth daily.    [provider]  cloNIDine (CATAPRES - DOSED IN MG/24 HR) 0.1 mg/24hr patch Place 0.1 mg onto the skin once a week.    [provider]  cloNIDine (CATAPRES) 0.1 MG tablet Take 0.1 mg by mouth as needed. Give if BP is greater than 160/90.    [provider]  colchicine 0.6 MG tablet Take 0.6 mg by mouth daily.    [provider]  diphenhydrAMINE (BENADRYL) 25 MG tablet Take 25 mg by mouth every 8 (eight) hours as needed.    [provider]  divalproex (DEPAKOTE) 500 MG DR tablet Take 1 tablet (500 mg total) by mouth 2 (two) times daily. 10/11/17   Marcial Pacas, MD  donepezil (ARICEPT) 5 MG tablet Take 5 mg by mouth at bedtime.    [provider]  DULoxetine (CYMBALTA) 20 MG capsule Take 20 mg by mouth 2 (two) times daily.    [provider]  famotidine (PEPCID) 20 MG tablet Take 20 mg by mouth as needed for heartburn or indigestion.    [provider]  ferrous sulfate 325 (65 FE) MG tablet Take 325 mg by mouth daily.    [provider]  furosemide (LASIX) 20 MG tablet Take 20 mg by mouth daily.     [provider]  furosemide (LASIX) 40 MG tablet Take 40 mg by mouth as needed. Every 8 hr for swelling prn    [provider]  gabapentin (NEURONTIN) 100 MG capsule Take 100 mg by mouth 3 (three) times daily.    [provider]  guaiFENesin (MUCINEX) 600 MG 12 hr tablet Take 600 mg by  mouth every 12 (twelve) hours as needed (congestion).  [provider]  guaifenesin (ROBITUSSIN) 100 MG/5ML syrup Take 200 mg by mouth as needed for cough. Take 55m q6h as needed.    [provider]  ipratropium-albuterol (DUONEB) 0.5-2.5 (3) MG/3ML SOLN Take 3 mLs by nebulization every 6 (six) hours as needed (SHORTNESS OF BREATH).     [provider]  lisinopril (PRINIVIL,ZESTRIL) 5 MG tablet Take 5 mg by mouth daily.    [provider]  LORazepam (ATIVAN) 0.5 MG tablet Take 0.5 mg by mouth every 8 (eight) hours.    [provider]  Melatonin 5 MG CAPS Take by mouth as needed.    [provider]  nitroGLYCERIN (NITROSTAT) 0.4 MG SL tablet Place 0.4 mg under the tongue every 5 (five) minutes as needed for chest pain.    [provider]  ondansetron (ZOFRAN) 4 MG tablet Take 4 mg by mouth every 8 (eight) hours as needed for nausea or vomiting.    [provider]  oxyCODONE (OXY IR/ROXICODONE) 5 MG immediate release tablet Take 5 mg by mouth every 8 (eight) hours.    [provider]  oxymetazoline (AFRIN) 0.05 % nasal spray Place 2 sprays into both nostrils as needed (nosebleeds).     [provider]  polyethylene glycol (MIRALAX / GLYCOLAX) packet Take 17 g by mouth daily.    [provider]  Propylene Glycol (SYSTANE BALANCE) 0.6 % SOLN Apply 2 drops to eye at bedtime as needed (DRY EYES).    [provider]  QUEtiapine (SEROQUEL) 25 MG tablet Take 50 mg by mouth at bedtime.    [provider]  senna-docusate (SENOKOT-S) 8.6-50 MG per tablet Take 2 tablets by mouth 2 (two) times daily.     [provider]  sertraline (ZOLOFT) 25 MG tablet Take 100 mg by mouth daily.     [provider]  vitamin B-12 (CYANOCOBALAMIN) 100 MCG tablet Take 100 mcg by mouth daily.    [provider]  vitamin C (ASCORBIC ACID) 500 MG tablet Take 500 mg by mouth 2 (two) times  daily.    [provider]     Allergies:     Allergies  Allergen Reactions  . Keppra [Levetiracetam] Other (See Comments)    Increased agitation  . Rocephin [Ceftriaxone Sodium In Dextrose] Other (See Comments)    Unknown reaction  . Ciprofloxacin Rash     Physical Exam:   Vitals  Blood pressure (!) 131/59, pulse 72, temperature 99.7 F (37.6 C), temperature source Rectal, resp. rate 20, SpO2 98 %.  1.  General: Appears lethargic  2. Psychiatric: Not tested, patient is nonverbal  3. Neurologic: Patient is alert, not following commands.  Speech incomprehensible  4. Eyes :  anicteric sclerae, moist conjunctivae with no lid lag.   5. ENMT:  Oropharynx clear with moist mucous membranes and good dentition  6. Neck:  supple, no cervical lymphadenopathy appriciated, No thyromegaly  7. Respiratory : Normal respiratory effort, good air movement bilaterally,clear to  auscultation bilaterally  8. Cardiovascular : RRR, no gallops, rubs or murmurs, no leg edema  9. Gastrointestinal:  Positive bowel sounds, abdomen soft, non-tender to palpation,no hepatosplenomegaly, no rigidity or guarding      10. Skin:  No cyanosis, normal texture and turgor, no rash, lesions or ulcers  11.Musculoskeletal:  Good muscle tone,  joints appear normal , no effusions,  normal range of motion    Data Review:    CBC Recent Labs  Lab 06/25/18 0206  WBC 14.3*  HGB 12.4*  HCT 41.1  PLT 355  MCV 100.5*  MCH 30.3  MCHC 30.2  RDW 14.2  LYMPHSABS 2.1  MONOABS 1.8*  EOSABS 0.0  BASOSABS 0.1   ------------------------------------------------------------------------------------------------------------------  Results for orders placed or performed during the hospital encounter of 06/25/18 (from the past 48 hour(s))  Comprehensive metabolic panel     Status: Abnormal   Collection Time: 06/25/18  2:06 AM  Result Value Ref Range   Sodium 151 (H) 135 - 145 mmol/L   Potassium  3.8 3.5 - 5.1 mmol/L   Chloride 115 (H) 98 - 111 mmol/L   CO2 27 22 - 32 mmol/L   Glucose, Bld 124 (H) 70 - 99 mg/dL   BUN 45 (H) 8 - 23 mg/dL   Creatinine, Ser 2.13 (H) 0.61 - 1.24 mg/dL   Calcium 8.8 (L) 8.9 - 10.3 mg/dL   Total Protein 8.1 6.5 - 8.1 g/dL   Albumin 3.4 (L) 3.5 - 5.0 g/dL   AST 17 15 - 41 U/L   ALT 17 0 - 44 U/L   Alkaline Phosphatase 60 38 - 126 U/L   Total Bilirubin 1.0 0.3 - 1.2 mg/dL   GFR calc non Af Amer 29 (L) >60 mL/min   GFR calc Af Amer 34 (L) >60 mL/min    Comment: (NOTE) The eGFR has been calculated using the CKD EPI equation. This calculation has not been validated in all clinical situations. eGFR's persistently <60 mL/min signify possible Chronic Kidney Disease.    Anion gap 9 5 - 15    Comment: Performed at [facility], [address], [place], [place] [address]  CBC with Differential     Status: Abnormal   Collection Time: 06/25/18  2:06 AM  Result Value Ref Range   WBC 14.3 (H) 4.0 - 10.5 K/uL   RBC 4.09 (L) 4.22 - 5.81 MIL/uL   Hemoglobin 12.4 (L) 13.0 - 17.0 g/dL   HCT 41.1 39.0 - 52.0 %   MCV 100.5 (H) 80.0 - 100.0 fL   MCH 30.3 26.0 - 34.0 pg   MCHC 30.2 30.0 - 36.0 g/dL   RDW 14.2 11.5 - 15.5 %   Platelets 355 150 - 400 K/uL   nRBC 0.0 0.0 - 0.2 %   Neutrophils Relative % 71 %   Neutro Abs 10.3 (H) 1.7 - 7.7 K/uL   Lymphocytes Relative 15 %   Lymphs Abs 2.1 0.7 - 4.0 K/uL   Monocytes Relative 13 %   Monocytes Absolute 1.8 (H) 0.1 - 1.0 K/uL   Eosinophils Relative 0 %   Eosinophils Absolute 0.0 0.0 - 0.5 K/uL   Basophils Relative 0 %   Basophils Absolute 0.1 0.0 - 0.1 K/uL   Immature Granulocytes 1 %   Abs Immature Granulocytes 0.08 (H) 0.00 - 0.07 K/uL    Comment: Performed at [facility], [address], [place], [place] [address]  Lactic acid, plasma     Status: None   Collection Time: 06/25/18  2:07 AM  Result Value Ref Range   Lactic Acid, Venous 1.6 0.5 - 1.9 mmol/L    Comment: Performed at [facility], [address], [place], [place] [address]  Urinalysis, Routine w reflex microscopic     Status: Abnormal   Collection Time: 06/25/18  2:18 AM  Result Value Ref Range   Color, Urine YELLOW YELLOW   APPearance HAZY (A) CLEAR   Specific Gravity, Urine 1.019 1.005 - 1.030   pH 7.0 5.0 - 8.0  Glucose, UA NEGATIVE NEGATIVE mg/dL   Hgb urine dipstick MODERATE (A) NEGATIVE   Bilirubin Urine NEGATIVE NEGATIVE   Ketones, ur NEGATIVE NEGATIVE mg/dL   Protein, ur 30 (A) NEGATIVE mg/dL   Nitrite NEGATIVE NEGATIVE   Leukocytes, UA LARGE (A) NEGATIVE   RBC / HPF >50 (H) 0 - 5 RBC/hpf   WBC, UA >50 (H) 0 - 5 WBC/hpf   Bacteria, UA RARE (A) NONE SEEN   Mucus PRESENT     Comment: Performed at [facility], [address], [place], [place] [address]    Chemistries  Recent Labs  Lab 06/25/18 0206  NA 151*  K 3.8  CL 115*  CO2 27  GLUCOSE 124*  BUN 45*  CREATININE 2.13*  CALCIUM 8.8*  AST 17  ALT 17  ALKPHOS 60  BILITOT 1.0   ------------------------------------------------------------------------------------------------------------------  ------------------------------------------------------------------------------------------------------------------ GFR: CrCl cannot be calculated (Unknown ideal weight.). Liver Function Tests: Recent Labs  Lab 06/25/18 0206  AST 17  ALT 17  ALKPHOS 60  BILITOT 1.0  PROT 8.1  ALBUMIN 3.4*    --------------------------------------------------------------------------------------------------------------- Urine analysis:    Component Value Date/Time   COLORURINE YELLOW 06/25/2018 0218   APPEARANCEUR HAZY (A) 06/25/2018 0218   LABSPEC 1.019 06/25/2018 0218   PHURINE 7.0 06/25/2018 0218   GLUCOSEU NEGATIVE 06/25/2018 0218   HGBUR MODERATE (A) 06/25/2018 0218   BILIRUBINUR NEGATIVE 06/25/2018 0218   KETONESUR NEGATIVE 06/25/2018 0218   PROTEINUR 30 (A) 06/25/2018 0218   UROBILINOGEN 0.2 12/26/2013 0817   NITRITE NEGATIVE 06/25/2018 0218   LEUKOCYTESUR  LARGE (A) 06/25/2018 0218      Imaging Results:    Dg Chest Port 1 View  Result Date: 06/25/2018 CLINICAL DATA:  Altered mental status EXAM: PORTABLE CHEST 1 VIEW COMPARISON:  03/12/2015 FINDINGS: Low lung volumes. Borderline cardiomegaly with aortic atherosclerosis. No focal opacity or pleural effusion. No pneumothorax. IMPRESSION: No active disease.  Borderline cardiomegaly Electronically Signed   By: Donavan Foil M.D.   On: 06/25/2018 02:10    My personal review of EKG: Rhythm NSR, QTC 563   Assessment & Plan:    Active Problems:   Hypernatremia   1. Hypernatremia-sodium 151, patient started on half-normal saline at 100 mL/h.  Follow sodium level in a.m.  2. Acute kidney injury-patient's baseline creatinine is around 1.2.  Today presented with creatinine of 2.13.  BUN 45.  Likely from poor p.o. intake.  Started on IV fluids as above.  Hold lisinopril, Lasix.  will follow renal function a.m.  3. UTI-patient has a normal UA.  Started on IV Azactam.  Urine culture has been obtained.  Will follow urine culture results.  4. Parkinson disease-continue carbidopa/levodopa 3 times daily.  5. Hypertension-continue Catapres patch, Coreg.  6. Paroxysmal atrial fibrillation-heart rate is controlled, continue Coreg, anticoagulation with Eliquis.  7. Seizure disorder-continue Depakote   DVT Prophylaxis-   Eliquis  AM Labs Ordered, also please review Full Orders  Family Communication: Admission, patients condition and plan of care including tests being ordered have been discussed with the patient and his wife at bedside who indicate understanding and agree with the plan and Code Status.  Code Status:  DNR  Admission status:Inpatient: Based on patients clinical presentation and evaluation of above clinical data, I have made determination that patient meets Inpatient criteria at this time. Requiring IV fluids for hypernatremia, IV antibiotics for UTI.  Time spent in minutes : 60  min   Oswald Hillock M.D on 06/25/2018 at 3:57 AM  Between 7am  to 7pm - Pager - [phone]. After 7pm go to [email or web address] - password [facility]  [facility] - Office  [phone]

## 2018-06-26 DIAGNOSIS — R569 Unspecified convulsions: Secondary | ICD-10-CM

## 2018-06-26 DIAGNOSIS — K219 Gastro-esophageal reflux disease without esophagitis: Secondary | ICD-10-CM

## 2018-06-26 DIAGNOSIS — F0280 Dementia in other diseases classified elsewhere without behavioral disturbance: Secondary | ICD-10-CM

## 2018-06-26 LAB — BASIC METABOLIC PANEL
Anion gap: 4 — ABNORMAL LOW (ref 5–15)
BUN: 40 mg/dL — AB (ref 8–23)
CALCIUM: 8.4 mg/dL — AB (ref 8.9–10.3)
CO2: 24 mmol/L (ref 22–32)
CREATININE: 1.49 mg/dL — AB (ref 0.61–1.24)
Chloride: 123 mmol/L — ABNORMAL HIGH (ref 98–111)
GFR calc Af Amer: 52 mL/min — ABNORMAL LOW (ref >60)
GFR, EST NON AFRICAN AMERICAN: 45 mL/min — AB (ref >60)
GLUCOSE: 111 mg/dL — AB (ref 70–99)
Potassium: 3.5 mmol/L (ref 3.5–5.1)
SODIUM: 151 mmol/L — AB (ref 135–145)

## 2018-06-26 MED ORDER — CHLORHEXIDINE GLUCONATE 0.12 % MT SOLN
15.0000 mL | Freq: Two times a day (BID) | OROMUCOSAL | Status: DC
  Administered 2018-06-26 – 2018-07-01 (×10): 15 mL via OROMUCOSAL
  Filled 2018-06-26 (×9): qty 15

## 2018-06-26 MED ORDER — ORAL CARE MOUTH RINSE
15.0000 mL | Freq: Two times a day (BID) | OROMUCOSAL | Status: DC
  Administered 2018-06-26 – 2018-07-01 (×7): 15 mL via OROMUCOSAL

## 2018-06-26 MED ORDER — FAMOTIDINE IN NACL 20-0.9 MG/50ML-% IV SOLN
20.0000 mg | INTRAVENOUS | Status: DC
  Administered 2018-06-26 – 2018-06-30 (×5): 20 mg via INTRAVENOUS
  Filled 2018-06-26 (×5): qty 50

## 2018-06-26 NOTE — Progress Notes (Signed)
PROGRESS NOTE    Mark [name]  ZLD:[id number] DOB: 1945-10-22 DOA: 06/25/2018 PCP: Caprice Renshaw, MD     Brief Narrative:  72 y.o. male, with history of Parkinson disease, atrial fibrillation on chronic anticoagulation with Eliquis, seizure disorder, CAD status post stenting 2012, hypertension, stroke, hyperlipidemia who was brought to the hospital for poor p.o. intake and lethargy going on for past 3 weeks.  As per patient's wife patient has gradually become weak over the past 3 weeks usually he would sit and shower chair for shower and they put him in shower bed.  Patient has not been able to sit in shower chair and has been having bed baths.  Patient also had decreased appetite over the past few days refused his medications.  And has been sleepy. In the ED patient is nonverbal and unable to provide any history though he is alert. Lab work showed dehydration with creatinine 2.13, sodium 151.    Patient also found to have a normal urinalysis and has been admitted for further evaluation and management.   Assessment & Plan: 1-gram-negative rods UTI -100,000 colonies has been isolated from his urine -Final culture information sensitivity pending -Continue current antibiotics -Continue supportive care. -Patient experienced low-grade temperatures yesterday  2-dehydration/hyponatremia -In the setting of poor p.o. intake -Continue IV fluids -Sodium appropriately trending down (151 at this time).  3-severe and advance Parkinson disease -Family will like PEG tube placement to further support hydration, nutrition and provide medications. -They understand that a PEG tube will no change the poor prognosis of his severe Parkinson disease or change at risk for aspiration. -General surgery has been consulted for PEG tube placement.  4-hypertension -Continue IV Lopressor and clonidine patch.  5-hx of seizure -Will use IV depacon   6-GERD -will use IV Pepcid   7-chronic pain syndrome -Has  been chronically using Vicodin at the nursing home -while unable to take pills will use as needed IV morphine.  DVT prophylaxis: SCDs Code Status: DNR/DNI. Family Communication: Wife and son at bedside. Disposition Plan: Remains inpatient, continue IV fluids, follow final results from urine culture, continue antibiotics.  General surgery consultation for PEG tube placement.  Consultants:   General surgery: Consulted for PEG tube placement.  Procedures:   See below for x-ray reports.  Antimicrobials:  Anti-infectives (From admission, onward)   Start     Dose/Rate Route Frequency Ordered Stop   06/25/18 1400  aztreonam (AZACTAM) 1 g in sodium chloride 0.9 % 100 mL IVPB     1 g 200 mL/hr over 30 Minutes Intravenous Every 8 hours 06/25/18 1107     06/25/18 0315  aztreonam (AZACTAM) 2 g in sodium chloride 0.9 % 100 mL IVPB     2 g 200 mL/hr over 30 Minutes Intravenous  Once 06/25/18 0306 06/25/18 0404       Subjective: Low-grade temperature overnight, no chest pain, no shortness of breath, good oxygen saturation on room air.  Patient is more alert, awake, oriented and able to follow simple commands  Objective: Vitals:   06/25/18 2202 06/26/18 0029 06/26/18 0644 06/26/18 1047  BP: (!) 144/60  135/61   Pulse: 71  65   Resp: 19  20   Temp: 99.7 F (37.6 C) 98.9 F (37.2 C) 98.7 F (37.1 C)   TempSrc: Oral Oral Oral   SpO2: 98%  95% 95%  Weight:        Intake/Output Summary (Last 24 hours) at 06/26/2018 1338 Last data filed at 06/26/2018 0700 Gross per  24 hour  Intake 1910.93 ml  Output 600 ml  Net 1310.93 ml   Filed Weights   06/25/18 1100  Weight: 124.7 kg    Examination: General exam: Alert, awake, following commands appropriately and answering simple questions.  Low-grade temperature overnight.  Still having difficulty taking oral meds. Respiratory system: Good air movement bilaterally, no wheezing.Marland Kitchen Respiratory effort normal.  Positive for scattered  rhonchi. Cardiovascular system:RRR. No murmurs, rubs, gallops. Gastrointestinal system: Abdomen is nondistended, soft and nontender. No organomegaly or masses felt. Normal bowel sounds heard. Central nervous system: Alert and oriented.  No new focal deficits.  Chronic changes from advanced Parkinson's appreciated. Extremities: No cyanosis or clubbing. Skin: No rashes, no petechiae.  Stage I-II pressure injury appreciated in his buttocks. Psychiatry: Judgement and insight appear normal. Mood & affect appropriate.     Data Reviewed: I have personally reviewed following labs and imaging studies  CBC: Recent Labs  Lab 06/25/18 0206 06/25/18 0433  WBC 14.3* 15.6*  NEUTROABS 10.3*  --   HGB 12.4* 12.2*  HCT 41.1 39.6  MCV 100.5* 102.6*  PLT 355 381   Basic Metabolic Panel: Recent Labs  Lab 06/25/18 0206 06/25/18 0433 06/26/18 0841  NA 151* 154* 151*  K 3.8 3.6 3.5  CL 115* 117* 123*  CO2 27 26 24   GLUCOSE 124* 133* 111*  BUN 45* 45* 40*  CREATININE 2.13* 1.98* 1.49*  CALCIUM 8.8* 8.6* 8.4*   GFR: Estimated Creatinine Clearance: 62.9 mL/min (A) (by C-G formula based on SCr of 1.49 mg/dL (H)).   Liver Function Tests: Recent Labs  Lab 06/25/18 0206 06/25/18 0433  AST 17 14*  ALT 17 17  ALKPHOS 60 57  BILITOT 1.0 1.0  PROT 8.1 7.5  ALBUMIN 3.4* 3.3*   Urine analysis:    Component Value Date/Time   COLORURINE YELLOW 06/25/2018 0218   APPEARANCEUR HAZY (A) 06/25/2018 0218   LABSPEC 1.019 06/25/2018 0218   PHURINE 7.0 06/25/2018 0218   GLUCOSEU NEGATIVE 06/25/2018 0218   HGBUR MODERATE (A) 06/25/2018 0218   BILIRUBINUR NEGATIVE 06/25/2018 [place] 06/25/2018 0218   PROTEINUR 30 (A) 06/25/2018 0218   UROBILINOGEN 0.2 12/26/2013 0817   NITRITE NEGATIVE 06/25/2018 0218   LEUKOCYTESUR LARGE (A) 06/25/2018 0218    Recent Results (from the past 240 hour(s))  Urine culture     Status: Abnormal (Preliminary result)   Collection Time: 06/25/18  2:33  AM  Result Value Ref Range Status   Specimen Description   Final    URINE, CATHETERIZED Performed at [facility], [address], [place], [place] [address]    Special Requests   Final    Normal Performed at [facility], [address] [place], [place] [address]    Culture >=100,000 COLONIES/mL PROTEUS MIRABILIS (A)  Final   Report Status PENDING  Incomplete  MRSA PCR Screening     Status: None   Collection Time: 06/25/18  4:33 PM  Result Value Ref Range Status   MRSA by PCR NEGATIVE NEGATIVE Final    Comment:        The GeneXpert MRSA Assay (FDA approved for NASAL specimens only), is one component of a comprehensive MRSA colonization surveillance program. It is not intended to diagnose MRSA infection nor to guide or monitor treatment for MRSA infections. Performed at [facility], [address], [place], [place] [address]     Radiology Studies: Dg Chest [facility] 1 View  Result Date: 06/25/2018 CLINICAL DATA:  Altered mental status EXAM:  PORTABLE CHEST 1 VIEW COMPARISON:  03/12/2015 FINDINGS: Low lung volumes. Borderline cardiomegaly with aortic atherosclerosis. No focal opacity or pleural effusion. No pneumothorax. IMPRESSION: No active disease.  Borderline cardiomegaly Electronically Signed   By: Donavan Foil M.D.   On: 06/25/2018 02:10   Scheduled Meds: . apixaban  5 mg Oral BID  . carbidopa-levodopa  1 tablet Oral TID  . chlorhexidine  15 mL Mouth Rinse BID  . cloNIDine  0.1 mg Transdermal Weekly  . donepezil  5 mg Oral QHS  . DULoxetine  20 mg Oral BID  . gabapentin  100 mg Oral TID  . mouth rinse  15 mL Mouth Rinse q12n4p  . metoprolol tartrate  2.5 mg Intravenous Q12H  . QUEtiapine  50 mg Oral QHS  . sertraline  100 mg Oral Daily   Continuous Infusions: . sodium chloride 100 mL/hr at 06/25/18 2302  . aztreonam 1 g (06/26/18 0506)  . valproate sodium 500 mg (06/26/18 0916)     LOS: 1 day    Time spent: 30 minutes  Barton Dubois, MD [facility] Pager [phone]  If 7PM-7AM, please contact night-coverage [email or web address] Password [email or web address] 06/26/2018, 1:38 PM

## 2018-06-27 DIAGNOSIS — R1319 Other dysphagia: Secondary | ICD-10-CM

## 2018-06-27 LAB — BASIC METABOLIC PANEL
ANION GAP: 9 (ref 5–15)
BUN: 37 mg/dL — AB (ref 8–23)
CHLORIDE: 123 mmol/L — AB (ref 98–111)
CO2: 21 mmol/L — ABNORMAL LOW (ref 22–32)
Calcium: 8.6 mg/dL — ABNORMAL LOW (ref 8.9–10.3)
Creatinine, Ser: 1.36 mg/dL — ABNORMAL HIGH (ref 0.61–1.24)
GFR calc Af Amer: 58 mL/min — ABNORMAL LOW (ref >60)
GFR calc non Af Amer: 50 mL/min — ABNORMAL LOW (ref >60)
GLUCOSE: 90 mg/dL (ref 70–99)
POTASSIUM: 3.2 mmol/L — AB (ref 3.5–5.1)
Sodium: 153 mmol/L — ABNORMAL HIGH (ref 135–145)

## 2018-06-27 MED ORDER — POTASSIUM CL IN DEXTROSE 5% 20 MEQ/L IV SOLN
20.0000 meq | INTRAVENOUS | Status: AC
  Administered 2018-06-27 – 2018-06-28 (×2): 20 meq via INTRAVENOUS
  Filled 2018-06-27 (×6): qty 1000

## 2018-06-27 MED ORDER — CHLORHEXIDINE GLUCONATE CLOTH 2 % EX PADS: 6.0000 | MEDICATED_PAD | Freq: Once | CUTANEOUS | Status: DC

## 2018-06-27 MED ORDER — SODIUM CHLORIDE 0.9 % IV SOLN
1.0000 g | INTRAVENOUS | Status: DC
  Administered 2018-06-27 – 2018-06-30 (×3): 1 g via INTRAVENOUS
  Filled 2018-06-27: qty 10
  Filled 2018-06-27: qty 1
  Filled 2018-06-27: qty 10
  Filled 2018-06-27: qty 1
  Filled 2018-06-27: qty 10
  Filled 2018-06-27: qty 1

## 2018-06-27 NOTE — NC FL2 (Signed)
[place] LEVEL OF CARE SCREENING TOOL     IDENTIFICATION  Patient Name: Mark [name] Birthdate: 1946/08/14 Sex: male Admission Date (Current Location): 06/25/2018  [facility] and [place] Number:  [employer] and Address:  [facility] [address], [place]      Provider Number: [id number]  Attending Physician Name and Address:  Barton Dubois, MD  Relative Name and Phone Number:       Current Level of Care: Hospital Recommended Level of Care: [place] Prior Approval Number:    Date Approved/Denied:   PASRR Number:    Discharge Plan: SNF    Current Diagnoses: Patient Active Problem List   Diagnosis Date Noted  . Other dysphagia   . Hypernatremia 06/25/2018  . Gait abnormality 05/26/2018  . Left arm swelling 06/15/2016  . 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 Psychosis ([place]) 01/24/2016  . Hallucinations   . Chest pain 08/22/2014  . Coronary artery disease involving native coronary artery of native heart with other form of angina pectoris ([place])   . Essential hypertension   . Hyperlipidemia   . Paroxysmal atrial fibrillation (HCC)   . Colon cancer screening 01/03/2014  . Elevated LFTs 10/05/2013  . Liver lesion 06/23/2012  . Pyelonephritis, acute 06/23/2012  . Hypotension 06/21/2012  . Bacteremia due to Escherichia coli 06/21/2012  . Sepsis ([place]) 06/20/2012  . FTT (failure to thrive) in adult 06/19/2012  . ARF (acute renal failure) ([place]) 06/19/2012  . Urinary tract infection with hematuria 06/19/2012  . Dehydration 06/19/2012  . Frequent falls 06/19/2012  . Hypokalemia 06/19/2012  . Anemia 06/19/2012  . Seizure disorder ([employer]) 06/19/2012  . Fracture of right olecranon process 10/23/2011  . Olecranon fracture, recurrent 10/19/2011  . Fall 08/13/2011  . CAD (coronary artery disease) 07/14/2011  . HTN (hypertension) 07/14/2011  . Parkinson disease ([place]) 07/14/2011    Orientation  RESPIRATION BLADDER Height & Weight     Self  Normal Continent Weight: 275 lb (124.7 kg) Height:     BEHAVIORAL SYMPTOMS/MOOD NEUROLOGICAL BOWEL NUTRITION STATUS      Continent Diet(see discharge summary )  AMBULATORY STATUS COMMUNICATION OF NEEDS Skin   Total Care Verbally Normal                       Personal Care Assistance Level of Assistance  Bathing, Feeding, Dressing Bathing Assistance: Maximum assistance Feeding assistance: Limited assistance Dressing Assistance: Maximum assistance     Functional Limitations Info  Sight, Hearing, Speech Sight Info: Adequate Hearing Info: Adequate Speech Info: Adequate    SPECIAL CARE FACTORS FREQUENCY                       Contractures Contractures Info: Not present    Additional Factors Info  Code Status, Allergies, Psychotropic Code Status Info: DNR Allergies Info: Keppra, Ciprofloxacin Psychotropic Info: seroquel, zoloft         Current Medications (06/27/2018):  This is the current hospital active medication list Current Facility-Administered Medications  Medication Dose Route Frequency Provider Last Rate Last Dose  . acetaminophen (TYLENOL) tablet 650 mg  650 mg Oral Q6H PRN Oswald Hillock, MD       Or  . acetaminophen (TYLENOL) suppository 650 mg  650 mg Rectal Q6H PRN Oswald Hillock, MD   650 mg at 06/26/18 1507  . bisacodyl (DULCOLAX) suppository 10 mg  10 mg Rectal PRN Oswald Hillock, MD      .  carbidopa-levodopa (SINEMET IR) 25-100 MG per tablet immediate release 1 tablet  1 tablet Oral TID Oswald Hillock, MD      . cefTRIAXone (ROCEPHIN) 1 g in sodium chloride 0.9 % 100 mL IVPB  1 g Intravenous Q24H Barton Dubois, MD 200 mL/hr at 06/27/18 1151 1 g at 06/27/18 1151  . chlorhexidine (PERIDEX) 0.12 % solution 15 mL  15 mL Mouth Rinse BID Barton Dubois, MD   15 mL at 06/27/18 0957  . Chlorhexidine Gluconate Cloth 2 % PADS 6 each  6 each Topical Once Aviva Signs, MD       And  . Chlorhexidine Gluconate  Cloth 2 % PADS 6 each  6 each Topical Once Aviva Signs, MD      . cloNIDine (CATAPRES - Dosed in mg/24 hr) patch 0.1 mg  0.1 mg Transdermal Weekly Oswald Hillock, MD   Stopped at 06/25/18 1020  . cloNIDine (CATAPRES) tablet 0.1 mg  0.1 mg Oral PRN Oswald Hillock, MD      . dextrose 5 % with KCl 20 mEq / L  infusion  20 mEq Intravenous Continuous Barton Dubois, MD 100 mL/hr at 06/27/18 1146 20 mEq at 06/27/18 1146  . donepezil (ARICEPT) tablet 5 mg  5 mg Oral QHS [place], Marge Duncans, MD      . DULoxetine (CYMBALTA) DR capsule 20 mg  20 mg Oral BID Oswald Hillock, MD      . famotidine (PEPCID) IVPB 20 mg premix  20 mg Intravenous Q24H Barton Dubois, MD 100 mL/hr at 06/27/18 1319 20 mg at 06/27/18 1319  . gabapentin (NEURONTIN) capsule 100 mg  100 mg Oral TID Oswald Hillock, MD      . ipratropium-albuterol (DUONEB) 0.5-2.5 (3) MG/3ML nebulizer solution 3 mL  3 mL Nebulization Q6H PRN Oswald Hillock, MD      . MEDLINE mouth rinse  15 mL Mouth Rinse q12n4p Barton Dubois, MD   15 mL at 06/26/18 1644  . metoprolol tartrate (LOPRESSOR) injection 2.5 mg  2.5 mg Intravenous Q12H Barton Dubois, MD   2.5 mg at 06/27/18 0958  . morphine 2 MG/ML injection 0.5-1 mg  0.5-1 mg Intravenous Q6H PRN Barton Dubois, MD   0.5 mg at 06/25/18 1824  . oxymetazoline (AFRIN) 0.05 % nasal spray 2 spray  2 spray Each Nare PRN Oswald Hillock, MD      . QUEtiapine (SEROQUEL) tablet 50 mg  50 mg Oral QHS Darrick Meigs, Marge Duncans, MD      . sertraline (ZOLOFT) tablet 100 mg  100 mg Oral Daily Darrick Meigs, Marge Duncans, MD      . valproate (DEPACON) 500 mg in dextrose 5 % 50 mL IVPB  500 mg Intravenous Q12H Barton Dubois, MD 55 mL/hr at 06/27/18 0958 500 mg at 06/27/18 1962     Discharge Medications: Please see discharge summary for a list of discharge medications.  Relevant Imaging Results:  Relevant Lab Results:   Additional Information SSN # [id number]  Ihor Gully, LCSW

## 2018-06-27 NOTE — Care Management Important Message (Signed)
Important Message  Patient Details  Name: Mark [name] MRN: [id number] Date of Birth: [birth year]   Medicare Important Message Given:  Yes    [name] 06/27/2018, 12:57 PM

## 2018-06-27 NOTE — Progress Notes (Signed)
PROGRESS NOTE    Mark [name]  ZYS:[id number] DOB: [birth year] DOA: 06/25/2018 PCP: Caprice Renshaw, MD     Brief Narrative:  72 y.o. male, with history of Parkinson disease, atrial fibrillation on chronic anticoagulation with Eliquis, seizure disorder, CAD status post stenting 2012, hypertension, stroke, hyperlipidemia who was brought to the hospital for poor p.o. intake and lethargy going on for past 3 weeks.  As per patient's wife patient has gradually become weak over the past 3 weeks usually he would sit and shower chair for shower and they put him in shower bed.  Patient has not been able to sit in shower chair and has been having bed baths.  Patient also had decreased appetite over the past few days refused his medications.  And has been sleepy. In the ED patient is nonverbal and unable to provide any history though he is alert. Lab work showed dehydration with creatinine 2.13, sodium 151.    Patient also found to have a normal urinalysis and has been admitted for further evaluation and management.   Assessment & Plan: 1-Proteus Mirabilis UTI -100,000 colonies has been isolated from his urine -Following culture results will narrow antibiotics to Rocephin -Microorganism is resistant to fluoroquinolones and nitrofurantoin. -Continue supportive care. -Patient afebrile at this moment and without complaints of dysuria.  2-dehydration/hypernatremia/hypokalemia -In the setting of poor p.o. intake and lack of adequate hydration -Continue IV fluids; but change to D5W with potassium supplementation -Sodium is 153 today.  3-severe and advance Parkinson disease -Family will like PEG tube placement to further support hydration, nutrition and provide medications. -They understand that a PEG tube will no change the poor prognosis of his severe Parkinson disease or change risk for aspiration. -General surgery has been consulted for PEG tube placement.  Anticipated plan is for PEG placement on  06/2029/19.  4-hypertension -Continue IV Lopressor and clonidine patch. -Vital signs stable overall.  5-hx of seizure -Will continue the use of IV depacon  -No seizure activity appreciated.  6-GERD -will use IV Pepcid   7-chronic pain syndrome -Has been chronically using Vicodin at the nursing home -while unable to take pills will use as needed IV morphine.  8-acute kidney injury -Prerenal in nature. -Continue fluid resuscitation -Creatinine trending down appropriately -Follow trend intermittently.  9-pressure injury -Affecting lower sacrum and buttocks -stage I mainly -Continue preventive measures and constant repositioning.  DVT prophylaxis: SCDs Code Status: DNR/DNI. Family Communication: Wife and son at bedside. Disposition Plan: Remains inpatient, continue IV fluids (transition to D5W with potassium supplementation); following urine culture antibiotics has been narrow to just Rocephin.  Plan is for PEG tube placement on 10/20 third and possibly discharge back to skilled nursing facility after that.  Follow electrolytes trend as his sodium is a still elevated.  Consultants:   General surgery: Consulted for PEG tube placement.  Procedures:   See below for x-ray reports.  Antimicrobials:  Anti-infectives (From admission, onward)   Start     Dose/Rate Route Frequency Ordered Stop   06/27/18 1030  cefTRIAXone (ROCEPHIN) 1 g in sodium chloride 0.9 % 100 mL IVPB     1 g 200 mL/hr over 30 Minutes Intravenous Every 24 hours 06/27/18 1016     06/25/18 1400  aztreonam (AZACTAM) 1 g in sodium chloride 0.9 % 100 mL IVPB  Status:  Discontinued     1 g 200 mL/hr over 30 Minutes Intravenous Every 8 hours 06/25/18 1107 06/27/18 1016   06/25/18 0315  aztreonam (AZACTAM) 2 g in sodium  chloride 0.9 % 100 mL IVPB     2 g 200 mL/hr over 30 Minutes Intravenous  Once 06/25/18 0306 06/25/18 0404       Subjective: Afebrile, in no acute distress.  Overall more alert and able to  follow simple commands.  Objective: Vitals:   06/26/18 1604 06/26/18 2127 06/27/18 0549 06/27/18 1527  BP: (!) 126/58 (!) 148/67 140/82 (!) 153/76  Pulse: 67 68 72 69  Resp: (!) 22 17 18 16   Temp:  98.7 F (37.1 C) 98.5 F (36.9 C) 99.4 F (37.4 C)  TempSrc:  Oral Oral Axillary  SpO2: 96% 96% 96% 97%  Weight:        Intake/Output Summary (Last 24 hours) at 06/27/2018 1736 Last data filed at 06/27/2018 1411 Gross per 24 hour  Intake 450.94 ml  Output 1500 ml  Net -1049.06 ml   Filed Weights   06/25/18 1100  Weight: 124.7 kg    Examination: General exam: Alert, awake and oriented.  Able to answer simple questions and follow simple commands.  No fever.  Has failed evaluation with the speech therapist and after discussion with family and patient decision to pursued feeding tube placement for hydration, nutrition and alternate route for medication has been decided. Respiratory system: No using accessory muscles, positive for scattered rhonchi, no wheezing.  Good O2 sat on room air. Cardiovascular system:RRR. No murmurs, rubs, gallops. Gastrointestinal system: Abdomen is nondistended, soft and nontender. No organomegaly or masses felt. Normal bowel sounds heard. Central nervous system: Alert and oriented. No focal neurological deficits.  Patient with chronic changes from advanced Parkinson's disease on exam (increases spasticity, decreased range of motion; positive dyskinesia). Extremities: No cyanosis or clubbing on exam. Skin: No rashes, no petechiae.  Stage I-II pressure injury appreciating his buttocks; I assumed This Injury Has Been Present Prior to Admission. Psychiatry: Judgement and insight appear normal. Mood & affect appropriate.   Data Reviewed: I have personally reviewed following labs and imaging studies  CBC: Recent Labs  Lab 06/25/18 0206 06/25/18 0433  WBC 14.3* 15.6*  NEUTROABS 10.3*  --   HGB 12.4* 12.2*  HCT 41.1 39.6  MCV 100.5* 102.6*  PLT 355 400    Basic Metabolic Panel: Recent Labs  Lab 06/25/18 0206 06/25/18 0433 06/26/18 0841 06/27/18 0911  NA 151* 154* 151* 153*  K 3.8 3.6 3.5 3.2*  CL 115* 117* 123* 123*  CO2 27 26 24  21*  GLUCOSE 124* 133* 111* 90  BUN 45* 45* 40* 37*  CREATININE 2.13* 1.98* 1.49* 1.36*  CALCIUM 8.8* 8.6* 8.4* 8.6*   GFR: Estimated Creatinine Clearance: 68.9 mL/min (A) (by C-G formula based on SCr of 1.36 mg/dL (H)).   Liver Function Tests: Recent Labs  Lab 06/25/18 0206 06/25/18 0433  AST 17 14*  ALT 17 17  ALKPHOS 60 57  BILITOT 1.0 1.0  PROT 8.1 7.5  ALBUMIN 3.4* 3.3*   Urine analysis:    Component Value Date/Time   COLORURINE YELLOW 06/25/2018 0218   APPEARANCEUR HAZY (A) 06/25/2018 0218   LABSPEC 1.019 06/25/2018 0218   PHURINE 7.0 06/25/2018 0218   GLUCOSEU NEGATIVE 06/25/2018 0218   HGBUR MODERATE (A) 06/25/2018 0218   BILIRUBINUR NEGATIVE 06/25/2018 [place] 06/25/2018 0218   PROTEINUR 30 (A) 06/25/2018 0218   UROBILINOGEN 0.2 12/26/2013 0817   NITRITE NEGATIVE 06/25/2018 0218   LEUKOCYTESUR LARGE (A) 06/25/2018 0218    Recent Results (from the past 240 hour(s))  Urine culture  Status: Abnormal (Preliminary result)   Collection Time: 06/25/18  2:33 AM  Result Value Ref Range Status   Specimen Description URINE, CATHETERIZED  Final   Special Requests Normal  Final   Culture >=100,000 COLONIES/mL PROTEUS MIRABILIS (A)  Final   Report Status PENDING  Incomplete   Organism ID, Bacteria PROTEUS MIRABILIS (A)  Final      Susceptibility   Proteus mirabilis - MIC*    AMPICILLIN <=2 SENSITIVE Sensitive     CEFAZOLIN <=4 SENSITIVE Sensitive     CEFTRIAXONE <=1 SENSITIVE Sensitive     CIPROFLOXACIN >=4 RESISTANT Resistant     GENTAMICIN <=1 SENSITIVE Sensitive     IMIPENEM 1 SENSITIVE Sensitive     NITROFURANTOIN 128 RESISTANT Resistant     TRIMETH/SULFA <=20 SENSITIVE Sensitive     AMPICILLIN/SULBACTAM <=2 SENSITIVE Sensitive     PIP/TAZO Value in  next row Sensitive      <=4 SENSITIVEPerformed at [place] [address] [place], [place] [address]    * >=100,000 COLONIES/mL PROTEUS MIRABILIS  MRSA PCR Screening     Status: None   Collection Time: 06/25/18  4:33 PM  Result Value Ref Range Status   MRSA by PCR NEGATIVE NEGATIVE Final    Comment:        The GeneXpert MRSA Assay (FDA approved for NASAL specimens only), is one component of a comprehensive MRSA colonization surveillance program. It is not intended to diagnose MRSA infection nor to guide or monitor treatment for MRSA infections. Performed at [facility], [address] [place], [place] [address]     Radiology Studies: No results found. Scheduled Meds: . carbidopa-levodopa  1 tablet Oral TID  . chlorhexidine  15 mL Mouth Rinse BID  . Chlorhexidine Gluconate Cloth  6 each Topical Once   And  . Chlorhexidine Gluconate Cloth  6 each Topical Once  . cloNIDine  0.1 mg Transdermal Weekly  . donepezil  5 mg Oral QHS  . DULoxetine  20 mg Oral BID  . gabapentin  100 mg Oral TID  . mouth rinse  15 mL Mouth Rinse q12n4p  . metoprolol tartrate  2.5 mg Intravenous Q12H  . QUEtiapine  50 mg Oral QHS  . sertraline  100 mg Oral Daily   Continuous Infusions: . cefTRIAXone (ROCEPHIN)  IV 1 g (06/27/18 1151)  . dextrose 5 % with KCl 20 mEq / L 20 mEq (06/27/18 1146)  . famotidine (PEPCID) IV 20 mg (06/27/18 1319)  . valproate sodium 500 mg (06/27/18 0958)     LOS: 2 days    Time spent: 30 minutes  Barton Dubois, MD [facility] Pager [phone]  If 7PM-7AM, please contact night-coverage [email or web address] Password [facility] 06/27/2018, 5:36 PM

## 2018-06-27 NOTE — Evaluation (Signed)
Clinical/Bedside Swallow Evaluation Patient Details  Name: Mark [name] MRN: [id number] Date of Birth: 02-03-1946  Today's Date: 06/27/2018 Time: SLP Start Time (ACUTE ONLY): 1015 SLP Stop Time (ACUTE ONLY): 1047 SLP Time Calculation (min) (ACUTE ONLY): 32 min  Past Medical History:  Past Medical History:  Diagnosis Date  . Arthritis   . Bacteremia due to Escherichia coli 06/21/2012  . Cervical vertebral fracture ([place])   . Coronary artery disease    SEVERE 3-VESSEL DX WITH NORMAL EF  . Fall   . Fracture of right olecranon process 10/23/2011  . Gout   . Hypertension   . Kidney stones   . MI (myocardial infarction) ([place]) 08/2011   /H&P (05/12/2012)  . Non-ST elevated myocardial infarction (non-STEMI) ([place])   . PAF (paroxysmal atrial fibrillation) ([place])   . Parkinson's disease   . 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 Adenoma.  . Pyelonephritis, acute 06/23/2012  . Seizures ([place]) 12/10/2011   HAD SEIZURE  . Skin cancer    left shoulder  . Sleep apnea    Past Surgical History:  Past Surgical History:  Procedure Laterality Date  . CARDIAC CATHETERIZATION  06/26/2011   NORMAL LV SYSTOLIC FUNCTION. RCA IS LARGE AND DOMINANT  . CORONARY ANGIOPLASTY WITH STENT PLACEMENT  ~ 2000   "one that I know of"  . HARDWARE REMOVAL  12/29/2011   Procedure: HARDWARE REMOVAL;  Surgeon: Johnny Bridge, MD;  Location: [facility];  Service: Orthopedics;  Laterality: Right;  . ORIF ELBOW FRACTURE  12/29/2011   Procedure: OPEN REDUCTION INTERNAL FIXATION (ORIF) ELBOW/OLECRANON FRACTURE;  Surgeon: Johnny Bridge, MD;  Location: [facility];  Service: Orthopedics;  Laterality: Right;  . PAROTIDECTOMY  05/12/2012   right  . PAROTIDECTOMY  05/12/2012   Procedure: PAROTIDECTOMY;  Surgeon: Rozetta Nunnery, MD;  Location: [facility];  Service: ENT;  Laterality: Right;  PAROTIDECTOMY MASS  . SKIN CANCER EXCISION  2013   left shoulder  . SKIN CANCER EXCISION     right ear  . TRANSTHORACIC ECHOCARDIOGRAM  06/26/2011   EF 55-60%    HPI:  Mark [name]  is a 72 y.o. male, with history of Parkinson disease, atrial fibrillation on chronic anticoagulation with Eliquis, seizure disorder, CAD status post stenting 2012, hypertension, stroke, hyperlipidemia who was brought to the hospital for poor p.o. intake and lethargy going on for past 3 weeks. PT was admitted secondary to dehydration and hypernatremia. Advanced Parkinson's disease with reported difficulty with eating, drinking or taking PO meds. Secondary to difficulty swallowing family has opted for PEG placement to support hydration, nutrition and provide medications. ST to evaluate swallowing   Assessment / Plan / Recommendation Clinical Impression  Pt was seated upright in bed with wife present for clinical swallowing evaluation. Pt did say 3-4 phrases over the course of evaluation 80% of verbalizations were unintelligible secondary to hypokinetic dysarthria. SLP provided oral care clearing notable lingual and labial dried secretions by means of toothettes and mouthwash. SLP provided education to the wife and nursing staff of the importance of frequent oral care while Pt is NPO. Pt was unable to trigger a swallow or cough on command during oral mechanism exam. One ice chip was administered to Pt with no oral reflex to manipulate bolus and no initiation of swallow or swallowing reflex as ice melted. SLP eventually cleared water from oral cavity. Pt's wife requested to speak with SLP in the hallway and asked "In your opinion can he swallow and did I do the right  thing about the tube?" SLP provided education of the progressive nature of Parkinson's and that he likely will not be able to obtain adequate nutrition by mouth any more unless he experiences a significant change (which is not likely - Parkinson's is very advanced). Wife reports that she wants Pt to have nourishment and that she has requested a PEG. Recommend ST f/u at SNF/ discharge location once Parkinson's medications (Pt has  been unable to swallow for >1week) have been resumed via alternative means to determine stimulability for pleasure feeds. There are no further ST needs while in acute setting. SLP Visit Diagnosis: Dysphagia, unspecified (R13.10)    Aspiration Risk  Severe aspiration risk    Diet Recommendation NPO   Medication Administration: Via alternative means    Other  Recommendations Oral Care Recommendations: Oral care QID;Staff/trained caregiver to provide oral care Other Recommendations: Have oral suction available   Follow up Recommendations   ST f/u at d/c location            Prognosis Prognosis for Safe Diet Advancement: Guarded Barriers to Reach Goals: Cognitive deficits;Severity of deficits      Swallow Study   General Date of Onset: 06/25/18 HPI: Mark [name]  is a 72 y.o. male, with history of Parkinson disease, atrial fibrillation on chronic anticoagulation with Eliquis, seizure disorder, CAD status post stenting 2012, hypertension, stroke, hyperlipidemia who was brought to the hospital for poor p.o. intake and lethargy going on for past 3 weeks. PT was admitted secondary to dehydration and hypernatremia. Advanced Parkinson's disease with reported difficulty with eating, drinking or taking PO meds. Secondary to difficulty swallowing family has opted for PEG placement to support hydration, nutrition and provide medications. ST to evaluate swallowing Type of Study: Bedside Swallow Evaluation Previous Swallow Assessment: BSE 2013 Diet Prior to this Study: NPO Temperature Spikes Noted: No Respiratory Status: Room air History of Recent Intubation: No Behavior/Cognition: Alert;Cooperative Oral Cavity Assessment: Dry;Dried secretions Oral Care Completed by SLP: Yes Oral Cavity - Dentition: Adequate natural dentition Patient Positioning: Upright in bed Baseline Vocal Quality: Low vocal intensity Volitional Cough: Cognitively unable to elicit Volitional Swallow: Unable to elicit     Oral/Motor/Sensory Function     Ice Chips Ice chips: Impaired Presentation: Spoon Oral Phase Impairments: Poor awareness of bolus Oral Phase Functional Implications: Oral residue;Right lateral sulci pocketing;Left lateral sulci pocketing Pharyngeal Phase Impairments: Unable to trigger swallow   Thin Liquid Thin Liquid: Not tested    Nectar Thick Nectar Thick Liquid: Not tested   Honey Thick Honey Thick Liquid: Not tested   Puree Puree: Not tested   Solid    Kanyah Matsushima H. Roddie Mc, CCC-SLP Speech Language Pathologist  Solid: Not tested      Wende Bushy 06/27/2018,1:23 PM

## 2018-06-27 NOTE — Consult Note (Signed)
Reason for Consult: Consultation for PEG Referring Physician: Dr. Thurston Pounds Mark [name] is an 72 y.o. male.  HPI: Patient is a 72 year old white male with multiple medical problems who was transferred from the nursing home for failure to thrive.  Patient has multiple medical problems.  Patient seems to have both dysphasia and decreased mental capability due to a history of CVA, seizures, and advanced Parkinson's disease.  Wife would like a gastrostomy tube placed to provide hydration, nutrition, and medication.  Patient has been on Eliquis, though it is difficult to know when he last took it.  Past Medical History:  Diagnosis Date  . Arthritis   . Bacteremia due to Escherichia coli 06/21/2012  . Cervical vertebral fracture ([place])   . Coronary artery disease    SEVERE 3-VESSEL DX WITH NORMAL EF  . Fall   . Fracture of right olecranon process 10/23/2011  . Gout   . Hypertension   . Kidney stones   . MI (myocardial infarction) ([place]) 08/2011   /H&P (05/12/2012)  . Non-ST elevated myocardial infarction (non-STEMI) ([place])   . PAF (paroxysmal atrial fibrillation) ([place])   . Parkinson's disease   . 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 Adenoma.  . Pyelonephritis, acute 06/23/2012  . Seizures ([place]) 12/10/2011   HAD SEIZURE  . Skin cancer    left shoulder  . Sleep apnea     Past Surgical History:  Procedure Laterality Date  . CARDIAC CATHETERIZATION  06/26/2011   NORMAL LV SYSTOLIC FUNCTION. RCA IS LARGE AND DOMINANT  . CORONARY ANGIOPLASTY WITH STENT PLACEMENT  ~ 2000   "one that I know of"  . HARDWARE REMOVAL  12/29/2011   Procedure: HARDWARE REMOVAL;  Surgeon: Johnny Bridge, MD;  Location: [facility];  Service: Orthopedics;  Laterality: Right;  . ORIF ELBOW FRACTURE  12/29/2011   Procedure: OPEN REDUCTION INTERNAL FIXATION (ORIF) ELBOW/OLECRANON FRACTURE;  Surgeon: Johnny Bridge, MD;  Location: [facility];  Service: Orthopedics;  Laterality: Right;  . PAROTIDECTOMY  05/12/2012   right  . PAROTIDECTOMY   05/12/2012   Procedure: PAROTIDECTOMY;  Surgeon: Rozetta Nunnery, MD;  Location: [facility];  Service: ENT;  Laterality: Right;  PAROTIDECTOMY MASS  . SKIN CANCER EXCISION  2013   left shoulder  . SKIN CANCER EXCISION     right ear  . TRANSTHORACIC ECHOCARDIOGRAM  06/26/2011   EF 55-60%    Family History  Problem Relation Age of Onset  . Heart attack Father   . Heart disease Unknown   . Arthritis Unknown   . Colon cancer Neg Hx   . Liver disease Neg Hx     Social History:  reports that he has never smoked. He has never used smokeless tobacco. He reports that he does not drink alcohol or use drugs.  Allergies:  Allergies  Allergen Reactions  . Keppra [Levetiracetam] Other (See Comments)    Increased agitation  . Rocephin [Ceftriaxone Sodium In Dextrose] Other (See Comments)    Unknown reaction  . Ciprofloxacin Rash    Medications: I have reviewed the patient's current medications.  Results for orders placed or performed during the hospital encounter of 06/25/18 (from the past 48 hour(s))  MRSA PCR Screening     Status: None   Collection Time: 06/25/18  4:33 PM  Result Value Ref Range   MRSA by PCR NEGATIVE NEGATIVE    Comment:        The GeneXpert MRSA Assay (FDA approved for NASAL specimens only), is one  component of a comprehensive MRSA colonization surveillance program. It is not intended to diagnose MRSA infection nor to guide or monitor treatment for MRSA infections. Performed at [facility], [address] [place], [place] [address]   Basic metabolic panel     Status: Abnormal   Collection Time: 06/26/18  8:41 AM  Result Value Ref Range   Sodium 151 (H) 135 - 145 mmol/L   Potassium 3.5 3.5 - 5.1 mmol/L   Chloride 123 (H) 98 - 111 mmol/L   CO2 24 22 - 32 mmol/L   Glucose, Bld 111 (H) 70 - 99 mg/dL   BUN 40 (H) 8 - 23 mg/dL   Creatinine, Ser 1.49 (H) 0.61 - 1.24 mg/dL   Calcium 8.4 (L) 8.9 - 10.3 mg/dL   GFR calc non Af Amer 45 (L) >60 mL/min   GFR calc  Af Amer 52 (L) >60 mL/min    Comment: (NOTE) The eGFR has been calculated using the CKD EPI equation. This calculation has not been validated in all clinical situations. eGFR's persistently <60 mL/min signify possible Chronic Kidney Disease.    Anion gap 4 (L) 5 - 15    Comment: Performed at [facility], [address], [place], [place] [address]  Basic metabolic panel     Status: Abnormal   Collection Time: 06/27/18  9:11 AM  Result Value Ref Range   Sodium 153 (H) 135 - 145 mmol/L   Potassium 3.2 (L) 3.5 - 5.1 mmol/L   Chloride 123 (H) 98 - 111 mmol/L   CO2 21 (L) 22 - 32 mmol/L   Glucose, Bld 90 70 - 99 mg/dL   BUN 37 (H) 8 - 23 mg/dL   Creatinine, Ser 1.36 (H) 0.61 - 1.24 mg/dL   Calcium 8.6 (L) 8.9 - 10.3 mg/dL   GFR calc non Af Amer 50 (L) >60 mL/min   GFR calc Af Amer 58 (L) >60 mL/min    Comment: (NOTE) The eGFR has been calculated using the CKD EPI equation. This calculation has not been validated in all clinical situations. eGFR's persistently <60 mL/min signify possible Chronic Kidney Disease.    Anion gap 9 5 - 15    Comment: Performed at [facility], [address], [place], [place] [address]    No results found.  ROS:  Review of systems not obtained due to patient factors.  Blood pressure 140/82, pulse 72, temperature 98.5 F (36.9 C), temperature source Oral, resp. rate 18, weight 124.7 kg, SpO2 96 %. Physical Exam: Pleasant white male no acute distress Head is normocephalic, atraumatic Lungs clear to auscultation with equal breath sounds bilaterally Heart examination reveals regular rate and rhythm without S3, S4, murmurs Abdomen soft, nontender, nondistended.  No surgical scars present. Dr. Anson Fret notes reviewed  Assessment/Plan: Impression: Failure to thrive due to dysphasia, Parkinson's disease, seizure disorder, mental deterioration Plan: We will proceed with PEG on 06/29/2018.  Would hold Eliquis.  The risks and benefits of the procedure  including bleeding, infection, and bowel injury were fully explained to the patient's wife, who gives informed consent for the patient.  Aviva Signs 06/27/2018, 12:48 PM

## 2018-06-27 NOTE — Clinical Social Work Note (Signed)
Patient's wife, Mrs. Igarashi, was at bedside. Patient has been a resident at [facility] for 6 years. He is total care. Mrs. Crotteau plans for patient to discharge back to the facility.   LCSW will follow through discharge.    Kavina Cantave, [name], LCSW

## 2018-06-28 DIAGNOSIS — G20 Parkinson's disease: Secondary | ICD-10-CM

## 2018-06-28 DIAGNOSIS — Z7189 Other specified counseling: Secondary | ICD-10-CM

## 2018-06-28 DIAGNOSIS — R1319 Other dysphagia: Secondary | ICD-10-CM

## 2018-06-28 DIAGNOSIS — Z515 Encounter for palliative care: Secondary | ICD-10-CM

## 2018-06-28 LAB — BASIC METABOLIC PANEL
Anion gap: 6 (ref 5–15)
BUN: 29 mg/dL — ABNORMAL HIGH (ref 8–23)
CALCIUM: 8.6 mg/dL — AB (ref 8.9–10.3)
CO2: 23 mmol/L (ref 22–32)
CREATININE: 1.36 mg/dL — AB (ref 0.61–1.24)
Chloride: 122 mmol/L — ABNORMAL HIGH (ref 98–111)
GFR calc Af Amer: 58 mL/min — ABNORMAL LOW (ref >60)
GFR, EST NON AFRICAN AMERICAN: 50 mL/min — AB (ref >60)
Glucose, Bld: 116 mg/dL — ABNORMAL HIGH (ref 70–99)
POTASSIUM: 3 mmol/L — AB (ref 3.5–5.1)
SODIUM: 151 mmol/L — AB (ref 135–145)

## 2018-06-28 MED ORDER — CHLORHEXIDINE GLUCONATE CLOTH 2 % EX PADS
6.0000 | MEDICATED_PAD | Freq: Once | CUTANEOUS | Status: AC
  Administered 2018-06-29: 6 via TOPICAL

## 2018-06-28 MED ORDER — POTASSIUM CHLORIDE 2 MEQ/ML IV SOLN
INTRAVENOUS | Status: DC
  Administered 2018-06-28 – 2018-06-30 (×5): via INTRAVENOUS
  Filled 2018-06-28 (×8): qty 1000

## 2018-06-28 NOTE — H&P (View-Only) (Signed)
  Subjective: No change.  Objective: Vital signs in last 24 hours: Temp:  [98.1 F (36.7 C)-99.4 F (37.4 C)] 98.1 F (36.7 C) (10/22 0555) Pulse Rate:  [60-69] 60 (10/22 0555) Resp:  [16-19] 18 (10/22 0555) BP: (137-159)/(68-80) 137/74 (10/22 0555) SpO2:  [95 %-98 %] 97 % (10/22 0555) Last BM Date: 06/27/18  Intake/Output from previous day: 10/21 0701 - 10/22 0700 In: 242.2 [I.V.:2.7; IV Piggyback:239.6] Out: 1000 [Urine:1000] Intake/Output this shift: No intake/output data recorded.  General appearance: no distress GI: soft, non-tender; bowel sounds normal; no masses,  no organomegaly  Lab Results:  No results for input(s): WBC, HGB, HCT, PLT in the last 72 hours. BMET Recent Labs    06/26/18 0841 06/27/18 0911  NA 151* 153*  K 3.5 3.2*  CL 123* 123*  CO2 24 21*  GLUCOSE 111* 90  BUN 40* 37*  CREATININE 1.49* 1.36*  CALCIUM 8.4* 8.6*   PT/INR No results for input(s): LABPROT, INR in the last 72 hours.  Studies/Results: No results found.  Anti-infectives: Anti-infectives (From admission, onward)   Start     Dose/Rate Route Frequency Ordered Stop   06/27/18 1030  cefTRIAXone (ROCEPHIN) 1 g in sodium chloride 0.9 % 100 mL IVPB     1 g 200 mL/hr over 30 Minutes Intravenous Every 24 hours 06/27/18 1016     06/25/18 1400  aztreonam (AZACTAM) 1 g in sodium chloride 0.9 % 100 mL IVPB  Status:  Discontinued     1 g 200 mL/hr over 30 Minutes Intravenous Every 8 hours 06/25/18 1107 06/27/18 1016   06/25/18 0315  aztreonam (AZACTAM) 2 g in sodium chloride 0.9 % 100 mL IVPB     2 g 200 mL/hr over 30 Minutes Intravenous  Once 06/25/18 0306 06/25/18 0404      Assessment/Plan: Impression: Dysphasia, Parkinson's disease, seizure disorder, failure to thrive Plan: Patient is scheduled for a PEG tomorrow.  The risks and benefits of the procedure including bleeding, perforation, and the possibility of infection were fully explained to the patient's wife, who gave  informed consent for the patient.  LOS: 3 days    Aviva Signs 06/28/2018

## 2018-06-28 NOTE — Progress Notes (Signed)
PROGRESS NOTE    Mark [name]  ELF:[id number] DOB: Jan 30, 1946 DOA: 06/25/2018 PCP: Caprice Renshaw, MD     Brief Narrative:  72 y.o. male, with history of Parkinson disease, atrial fibrillation on chronic anticoagulation with Eliquis, seizure disorder, CAD status post stenting 2012, hypertension, stroke, hyperlipidemia who was brought to the hospital for poor p.o. intake and lethargy going on for past 3 weeks.  As per patient's wife patient has gradually become weak over the past 3 weeks usually he would sit and shower chair for shower and they put him in shower bed.  Patient has not been able to sit in shower chair and has been having bed baths.  Patient also had decreased appetite over the past few days refused his medications.  And has been sleepy. In the ED patient is nonverbal and unable to provide any history though he is alert. Lab work showed dehydration with creatinine 2.13, sodium 151.    Patient also found to have a normal urinalysis and has been admitted for further evaluation and management.   Assessment & Plan: 1-Proteus Mirabilis UTI -100,000 colonies has been isolated from his urine -Following culture results antibiotics has been narrowed to Rocephin -Microorganism is resistant to fluoroquinolones and nitrofurantoin. -Continue supportive care. -Patient afebrile at this moment and without complaints of dysuria.  2-dehydration/hypernatremia/hypokalemia -In the setting of poor p.o. intake and lack of adequate hydration -Continue IV fluids; D5W with potassium supplementation (increasing potassium to 40 mEq) -Sodium is 151 today. -K 3.0  3-severe and advance Parkinson disease -Family will like PEG tube placement to further support hydration, nutrition and provide medications. -They understand that a PEG tube will no change the poor prognosis of his severe Parkinson disease or change risk for aspiration. -General surgery has been consulted for PEG tube placement.  Anticipated  plan is for PEG placement on 06/29/18.  4-hypertension -Continue IV Lopressor and clonidine patch. -Vital signs stable overall.  5-hx of seizure -Will continue the use of IV depacon  -No seizure activity appreciated.  6-GERD -will use IV Pepcid   7-chronic pain syndrome -Has been chronically using Vicodin at the nursing home -while unable to take pills will use as needed IV morphine.  8-acute kidney injury -Prerenal in nature. -Continue fluid resuscitation -Creatinine trending down appropriately -Follow trend intermittently.  9-pressure injury -Affecting lower sacrum and buttocks -stage I mainly -Continue preventive measures and constant repositioning.  DVT prophylaxis: SCDs Code Status: DNR/DNI. Family Communication: Wife and son at bedside. Disposition Plan: Remains inpatient, continue IV fluids (transition to D5W with potassium supplementation); following urine culture antibiotics has been narrow to just Rocephin.  Plan is for PEG tube placement on 10/23 and and then discharged back to skilled nursing facility. Follow electrolytes trend as his sodium is a still elevated.  Consultants:   General surgery: Consulted for PEG tube placement.  Procedures:   See below for x-ray reports.  Antimicrobials:  Anti-infectives (From admission, onward)   Start     Dose/Rate Route Frequency Ordered Stop   06/27/18 1030  cefTRIAXone (ROCEPHIN) 1 g in sodium chloride 0.9 % 100 mL IVPB     1 g 200 mL/hr over 30 Minutes Intravenous Every 24 hours 06/27/18 1016     06/25/18 1400  aztreonam (AZACTAM) 1 g in sodium chloride 0.9 % 100 mL IVPB  Status:  Discontinued     1 g 200 mL/hr over 30 Minutes Intravenous Every 8 hours 06/25/18 1107 06/27/18 1016   06/25/18 0315  aztreonam (AZACTAM) 2 g  in sodium chloride 0.9 % 100 mL IVPB     2 g 200 mL/hr over 30 Minutes Intravenous  Once 06/25/18 0306 06/25/18 0404     Subjective: Afebrile, in no acute distress.  Patient continued to be  more alert, well oriented and answering questions appropriately.  Objective: Vitals:   06/27/18 2100 06/28/18 0555 06/28/18 1017 06/28/18 1411  BP: (!) 159/68 137/74 (!) 151/71 139/74  Pulse: 67 60 (!) 58 61  Resp: 19 18    Temp: 98.7 F (37.1 C) 98.1 F (36.7 C)  98.9 F (37.2 C)  TempSrc: Oral Oral  Oral  SpO2: 95% 97%  98%  Weight:        Intake/Output Summary (Last 24 hours) at 06/28/2018 1743 Last data filed at 06/28/2018 1522 Gross per 24 hour  Intake 1908.07 ml  Output 900 ml  Net 1008.07 ml   Filed Weights   06/25/18 1100  Weight: 124.7 kg    Examination: General exam: Alert, awake, oriented and answering questions/following simple commands appropriately.  In no acute distress. Respiratory system: Scattered rhonchi, otherwise clear to auscultation. Respiratory effort normal.  No using accessory muscles. Cardiovascular system:RRR. No murmurs, rubs, gallops. Gastrointestinal system: Abdomen is nondistended, soft and nontender. No organomegaly or masses felt. Normal bowel sounds heard. Central nervous system: Alert and oriented. No new focal deficits. Extremities: No cyanosis or clubbing on exam. Skin: No rashes, no petechiae; stage I-II pressure injury appreciated in his buttocks; this injury has most likely be present prior to admission.  Seen by me since my first examination. Psychiatry: Judgement and insight appear normal. Mood & affect appropriate.   Data Reviewed: I have personally reviewed following labs and imaging studies  CBC: Recent Labs  Lab 06/25/18 0206 06/25/18 0433  WBC 14.3* 15.6*  NEUTROABS 10.3*  --   HGB 12.4* 12.2*  HCT 41.1 39.6  MCV 100.5* 102.6*  PLT 355 196   Basic Metabolic Panel: Recent Labs  Lab 06/25/18 0206 06/25/18 0433 06/26/18 0841 06/27/18 0911 06/28/18 0818  NA 151* 154* 151* 153* 151*  K 3.8 3.6 3.5 3.2* 3.0*  CL 115* 117* 123* 123* 122*  CO2 27 26 24  21* 23  GLUCOSE 124* 133* 111* 90 116*  BUN 45* 45* 40* 37*  29*  CREATININE 2.13* 1.98* 1.49* 1.36* 1.36*  CALCIUM 8.8* 8.6* 8.4* 8.6* 8.6*   GFR: Estimated Creatinine Clearance: 68.9 mL/min (A) (by C-G formula based on SCr of 1.36 mg/dL (H)).   Liver Function Tests: Recent Labs  Lab 06/25/18 0206 06/25/18 0433  AST 17 14*  ALT 17 17  ALKPHOS 60 57  BILITOT 1.0 1.0  PROT 8.1 7.5  ALBUMIN 3.4* 3.3*   Urine analysis:    Component Value Date/Time   COLORURINE YELLOW 06/25/2018 0218   APPEARANCEUR HAZY (A) 06/25/2018 0218   LABSPEC 1.019 06/25/2018 0218   PHURINE 7.0 06/25/2018 0218   GLUCOSEU NEGATIVE 06/25/2018 0218   HGBUR MODERATE (A) 06/25/2018 0218   BILIRUBINUR NEGATIVE 06/25/2018 [place] 06/25/2018 0218   PROTEINUR 30 (A) 06/25/2018 0218   UROBILINOGEN 0.2 12/26/2013 0817   NITRITE NEGATIVE 06/25/2018 0218   LEUKOCYTESUR LARGE (A) 06/25/2018 0218    Recent Results (from the past 240 hour(s))  Urine culture     Status: Abnormal (Preliminary result)   Collection Time: 06/25/18  2:33 AM  Result Value Ref Range Status   Specimen Description   Final    URINE, CATHETERIZED Performed at [facility], [place]  [address] [place], [place] [address]    Special Requests   Final    Normal Performed at [facility], [address] [place], [place] [address]    Culture (A)  Final    >=100,000 COLONIES/mL PROTEUS MIRABILIS CULTURE REINCUBATED FOR BETTER GROWTH Performed at [facility], [place] [address] [place], [place] [address]    Report Status PENDING  Incomplete   Organism ID, Bacteria PROTEUS MIRABILIS (A)  Final      Susceptibility   Proteus mirabilis - MIC*    AMPICILLIN <=2 SENSITIVE Sensitive     CEFAZOLIN <=4 SENSITIVE Sensitive     CEFTRIAXONE <=1 SENSITIVE Sensitive     CIPROFLOXACIN >=4 RESISTANT Resistant     GENTAMICIN <=1 SENSITIVE Sensitive     IMIPENEM 1 SENSITIVE Sensitive     NITROFURANTOIN 128 RESISTANT Resistant     TRIMETH/SULFA <=20 SENSITIVE Sensitive     AMPICILLIN/SULBACTAM <=2  SENSITIVE Sensitive     PIP/TAZO <=4 SENSITIVE Sensitive     * >=100,000 COLONIES/mL PROTEUS MIRABILIS  MRSA PCR Screening     Status: None   Collection Time: 06/25/18  4:33 PM  Result Value Ref Range Status   MRSA by PCR NEGATIVE NEGATIVE Final    Comment:        The GeneXpert MRSA Assay (FDA approved for NASAL specimens only), is one component of a comprehensive MRSA colonization surveillance program. It is not intended to diagnose MRSA infection nor to guide or monitor treatment for MRSA infections. Performed at [facility], [address] [place], [place] [address]     Radiology Studies: No results found. Scheduled Meds: . carbidopa-levodopa  1 tablet Oral TID  . chlorhexidine  15 mL Mouth Rinse BID  . Chlorhexidine Gluconate Cloth  6 each Topical Once   And  . Chlorhexidine Gluconate Cloth  6 each Topical Once  . cloNIDine  0.1 mg Transdermal Weekly  . donepezil  5 mg Oral QHS  . DULoxetine  20 mg Oral BID  . gabapentin  100 mg Oral TID  . mouth rinse  15 mL Mouth Rinse q12n4p  . metoprolol tartrate  2.5 mg Intravenous Q12H  . QUEtiapine  50 mg Oral QHS  . sertraline  100 mg Oral Daily   Continuous Infusions: . cefTRIAXone (ROCEPHIN)  IV 1 g (06/28/18 1030)  . dextrose 5 % with kcl 100 mL/hr at 06/28/18 1026  . famotidine (PEPCID) IV 20 mg (06/28/18 1505)  . valproate sodium 500 mg (06/28/18 1126)     LOS: 3 days   Time spent: 30 minutes  Barton Dubois, MD [facility] Pager [phone]  If 7PM-7AM, please contact night-coverage [email or web address] Password [facility] 06/28/2018, 5:43 PM

## 2018-06-28 NOTE — Consult Note (Signed)
                                                                                 Consultation Note Date: 06/28/2018   Patient Name: Mark [name]  DOB: 12/07/1945  MRN: [id number]  Age / Sex: 72 y.o., male  PCP: Blass, Joel, MD Referring Physician: Madera, Carlos, MD  Reason for Consultation: Establishing goals of care  HPI/Patient Profile: 72 y.o. male  with past medical history of CVA, Parkinson's, living in SNF for 2 years, CAD, PAF admitted on 06/25/2018 with progressing lethargy and inability to ambulate for 3 weeks. Workup revealed dehydration with hypernatremia, UTI. Spouse has been in discussion with patient's providers regarding placement of PEG tube- palliative medicine consulted for further GOC.   Clinical Assessment and Goals of Care: Met first with patient's spouse separately at request of spouse.  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 He always valued his personal hygiene. They were high school sweethearts, but initially married other spouses. They reconnected after mutual divorces. Patient's wife tells me she has settled on decision to place PEG.  Prior to admission patient experienced rapid 3 week decline where he stopped eating and drinking, was sleeping more than he was awake, and became unable to get out of bed.  Before that 3 week period, he was able to ambulate minimally, go to the shower.  We discussed GOC, continued aggressive medical care, comfort care, EOL expectations. Sarah states she knows that patient is in end stage of Parkinson's illness, however, as patient is still able to communicate, she feels he would want to continue to prolong his life- she has asked him his preference regarding PEG and she notes that he has has agreed to it.  I evaluated patient- he answered questions appropriately. Tells me about his former job. Discussed with him his advancing illness. He understands his Parkinson's will not reverse, however, he also wishes to place PEG  in efforts to continue to prolong his life. Options of hospice and comfort were also presented to patient. Discussed with him that at any time he is ready for Hospice this is an option for him.  Discussed anticipatory care- encouraged patient and spouse to imagine "bottom line"- and note there may come a time when discontinuing artificial feeding and hydration is appropriate.  Patient complains of "pain all over". He has history of chronic pain and has not been able to receive pain medication. Recommend frequent assessment of pain.   Primary Decision Maker NEXT OF KIN- patient's spouse- Sarah    SUMMARY OF RECOMMENDATIONS -Continue full scope care  -Frequent assessment for pain   Code Status/Advance Care Planning:  DNR   Additional Recommendations (Limitations, Scope, Preferences):  Full Scope Treatment  Prognosis:    Unable to determine  Discharge Planning: SNF  Primary Diagnoses: Present on Admission: . Hypernatremia   I have reviewed the medical record, interviewed the patient and family, and examined the patient. The following aspects are pertinent.  Past Medical History:  Diagnosis Date  . Arthritis   . Bacteremia due to Escherichia coli 06/21/2012  . Cervical vertebral fracture (HCC)   . Coronary artery disease    SEVERE   3-VESSEL DX WITH NORMAL EF  . Fall   . Fracture of right olecranon process 10/23/2011  . Gout   . Hypertension   . Kidney stones   . MI (myocardial infarction) (HCC) 08/2011   /H&P (05/12/2012)  . Non-ST elevated myocardial infarction (non-STEMI) (HCC)   . PAF (paroxysmal atrial fibrillation) (HCC)   . Parkinson's disease   . 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 Adenoma.  . Pyelonephritis, acute 06/23/2012  . Seizures (HCC) 12/10/2011   HAD SEIZURE  . Skin cancer    left shoulder  . Sleep apnea    Social History   Socioeconomic History  . Marital status: Married    Spouse name: Not on file  . Number of children: 1  . Years of education:  Bachelors  . Highest education level: Not on file  Occupational History  . Occupation: Disabled  Social Needs  . Financial resource strain: Not on file  . Food insecurity:    Worry: Not on file    Inability: Not on file  . Transportation needs:    Medical: Not on file    Non-medical: Not on file  Tobacco Use  . Smoking status: Never Smoker  . Smokeless tobacco: Never Used  Substance and Sexual Activity  . Alcohol use: No    Alcohol/week: 0.0 standard drinks  . Drug use: No  . Sexual activity: Yes  Lifestyle  . Physical activity:    Days per week: Not on file    Minutes per session: Not on file  . Stress: Not on file  Relationships  . Social connections:    Talks on phone: Not on file    Gets together: Not on file    Attends religious service: Not on file    Active member of club or organization: Not on file    Attends meetings of clubs or organizations: Not on file    Relationship status: Not on file  Other Topics Concern  . Not on file  Social History Narrative   Lives at [facility] in [place].   Right-handed.   No caffeine use.   Family History  Problem Relation Age of Onset  . Heart attack Father   . Heart disease Unknown   . Arthritis Unknown   . Colon cancer Neg Hx   . Liver disease Neg Hx    Scheduled Meds: . carbidopa-levodopa  1 tablet Oral TID  . chlorhexidine  15 mL Mouth Rinse BID  . Chlorhexidine Gluconate Cloth  6 each Topical Once   And  . Chlorhexidine Gluconate Cloth  6 each Topical Once  . cloNIDine  0.1 mg Transdermal Weekly  . donepezil  5 mg Oral QHS  . DULoxetine  20 mg Oral BID  . gabapentin  100 mg Oral TID  . mouth rinse  15 mL Mouth Rinse q12n4p  . metoprolol tartrate  2.5 mg Intravenous Q12H  . QUEtiapine  50 mg Oral QHS  . sertraline  100 mg Oral Daily   Continuous Infusions: . cefTRIAXone (ROCEPHIN)  IV 1 g (06/28/18 1030)  . dextrose 5 % with kcl 100 mL/hr at 06/28/18 1026  . famotidine (PEPCID) IV Stopped  (06/27/18 2152)  . valproate sodium 500 mg (06/28/18 1126)   PRN Meds:.acetaminophen **OR** acetaminophen, bisacodyl, cloNIDine, ipratropium-albuterol, morphine injection, oxymetazoline Medications Prior to Admission:  Prior to Admission medications   Medication Sig Start Date End Date Taking? Authorizing Provider  apixaban (ELIQUIS) 5 MG TABS tablet Take 5 mg by mouth 2 (  two) times daily.   Yes [provider]  atorvastatin (LIPITOR) 40 MG tablet Take 40 mg by mouth daily.   Yes [provider]  bisacodyl (DULCOLAX) 10 MG suppository Place 10 mg rectally as needed for moderate constipation.   Yes [provider]  carbidopa-levodopa (SINEMET IR) 25-100 MG tablet Take 2 tablets by mouth 3 (three) times daily.    Yes [provider]  carvedilol (COREG) 3.125 MG tablet Take 1 tablet (3.125 mg total) by mouth 2 (two) times daily with a meal. 04/05/12  Yes Nahser, Wonda Cheng, MD  Cholecalciferol (VITAMIN D) 2000 UNITS tablet Take 2,000 Units by mouth daily.   Yes [provider]  cloNIDine (CATAPRES - DOSED IN MG/24 HR) 0.1 mg/24hr patch Place 0.1 mg onto the skin once a week.   Yes [provider]  cloNIDine (CATAPRES) 0.1 MG tablet Take 0.1 mg by mouth as needed. Give if BP is greater than 160/90.   Yes [provider]  colchicine 0.6 MG tablet Take 0.6 mg by mouth daily.   Yes [provider]  diphenhydrAMINE (BENADRYL) 25 MG tablet Take 25 mg by mouth every 8 (eight) hours as needed.   Yes [provider]  donepezil (ARICEPT) 5 MG tablet Take 5 mg by mouth at bedtime.   Yes [provider]  doxycycline (VIBRA-TABS) 100 MG tablet Take 100 mg by mouth 2 (two) times daily.   Yes [provider]  DULoxetine (CYMBALTA) 20 MG capsule Take 20 mg by mouth 2 (two) times daily.   Yes [provider]  famotidine (PEPCID) 20 MG tablet Take 20 mg by mouth as needed for heartburn or indigestion.   Yes  [provider]  ferrous sulfate 325 (65 FE) MG tablet Take 325 mg by mouth 2 (two) times daily with a meal.    Yes [provider]  furosemide (LASIX) 20 MG tablet Take 20 mg by mouth 3 (three) times daily as needed (Leg Swelling).    Yes [provider]  furosemide (LASIX) 40 MG tablet Take 40 mg by mouth 2 (two) times daily. Every 8 hr for swelling prn    Yes [provider]  gabapentin (NEURONTIN) 100 MG capsule Take 100 mg by mouth 3 (three) times daily.   Yes [provider]  guaiFENesin (MUCINEX) 600 MG 12 hr tablet Take 600 mg by mouth every 12 (twelve) hours as needed (congestion).   Yes [provider]  guaifenesin (ROBITUSSIN) 100 MG/5ML syrup Take 200 mg by mouth as needed for cough. Take 96m q6h as needed.   Yes [provider]  ipratropium-albuterol (DUONEB) 0.5-2.5 (3) MG/3ML SOLN Take 3 mLs by nebulization every 6 (six) hours as needed (SHORTNESS OF BREATH).    Yes [provider]  lisinopril (PRINIVIL,ZESTRIL) 5 MG tablet Take 5 mg by mouth daily.   Yes [provider]  LORazepam (ATIVAN) 0.5 MG tablet Take 0.5 mg by mouth every 8 (eight) hours.   Yes [provider]  Melatonin 5 MG CAPS Take by mouth as needed.   Yes [provider]  nitroGLYCERIN (NITROSTAT) 0.4 MG SL tablet Place 0.4 mg under the tongue every 5 (five) minutes as needed for chest pain.   Yes [provider]  ondansetron (ZOFRAN) 4 MG tablet Take 4 mg by mouth every 8 (eight) hours as needed for nausea or vomiting.   Yes [provider]  oxyCODONE (OXY IR/ROXICODONE) 5 MG immediate release tablet Take 5 mg by  mouth every 8 (eight) hours.   Yes [provider]  oxymetazoline (AFRIN) 0.05 % nasal spray Place 2 sprays into both nostrils as needed (nosebleeds).    Yes [provider]  polyethylene glycol (MIRALAX / GLYCOLAX) packet Take 17 g by mouth 2 (two) times daily.    Yes [provider]  Propylene Glycol (SYSTANE BALANCE) 0.6 % SOLN Apply 2 drops to eye at bedtime as needed (DRY EYES).   Yes [provider]  QUEtiapine (SEROQUEL) 25 MG tablet Take 50 mg by mouth at bedtime.   Yes [provider]  senna-docusate (SENOKOT-S) 8.6-50 MG per tablet Take 2 tablets by mouth 2 (two) times daily.    Yes [provider]  sertraline (ZOLOFT) 25 MG tablet Take 100 mg by mouth daily.    Yes [provider]  vitamin B-12 (CYANOCOBALAMIN) 100 MCG tablet Take 100 mcg by mouth daily.   Yes [provider]  vitamin C (ASCORBIC ACID) 500 MG tablet Take 500 mg by mouth 2 (two) times daily.   Yes [provider]   Allergies  Allergen Reactions  . Keppra [Levetiracetam] Other (See Comments)    Increased agitation  . Ciprofloxacin Rash   Review of Systems  Physical Exam  Cardiovascular: Normal rate.  Pulmonary/Chest: Effort normal.  Neurological:  Parkinson's mask  Skin: Skin is warm and dry.  Nursing note and vitals reviewed.   Vital Signs: BP 139/74   Pulse 61   Temp 98.9 F (37.2 C) (Oral)   Resp 18   Wt 124.7 kg   SpO2 98%   BMI 35.31 kg/m  Pain Scale: PAINAD   Pain Score: 0-No pain   SpO2: SpO2: 98 % O2 Device:SpO2: 98 % O2 Flow Rate: .   IO: Intake/output summary:   Intake/Output Summary (Last 24 hours) at 06/28/2018 1507 Last data filed at 06/28/2018 1400 Gross per 24 hour  Intake -  Output 900 ml  Net -900 ml    LBM: Last BM Date: 06/27/18 Baseline Weight: Weight: 124.7 kg Most recent weight: Weight: 124.7 kg     Palliative Assessment/Data: PPS: 10%     Thank you for this consult. Palliative medicine will continue to follow and assist as needed.   Time In: 1415 Time Out: 1530 Time Total: 75 minutes Greater than 50%  of this time was spent counseling and coordinating care related to the above assessment and plan.  Signed by:  , AGNP-C Palliative Medicine    Please  contact Palliative Medicine Team phone at [phone] for questions and concerns.  For individual provider: See Amion                       

## 2018-06-28 NOTE — Progress Notes (Signed)
  Subjective: No change.  Objective: Vital signs in last 24 hours: Temp:  [98.1 F (36.7 C)-99.4 F (37.4 C)] 98.1 F (36.7 C) (10/22 0555) Pulse Rate:  [60-69] 60 (10/22 0555) Resp:  [16-19] 18 (10/22 0555) BP: (137-159)/(68-80) 137/74 (10/22 0555) SpO2:  [95 %-98 %] 97 % (10/22 0555) Last BM Date: 06/27/18  Intake/Output from previous day: 10/21 0701 - 10/22 0700 In: 242.2 [I.V.:2.7; IV Piggyback:239.6] Out: 1000 [Urine:1000] Intake/Output this shift: No intake/output data recorded.  General appearance: no distress GI: soft, non-tender; bowel sounds normal; no masses,  no organomegaly  Lab Results:  No results for input(s): WBC, HGB, HCT, PLT in the last 72 hours. BMET Recent Labs    06/26/18 0841 06/27/18 0911  NA 151* 153*  K 3.5 3.2*  CL 123* 123*  CO2 24 21*  GLUCOSE 111* 90  BUN 40* 37*  CREATININE 1.49* 1.36*  CALCIUM 8.4* 8.6*   PT/INR No results for input(s): LABPROT, INR in the last 72 hours.  Studies/Results: No results found.  Anti-infectives: Anti-infectives (From admission, onward)   Start     Dose/Rate Route Frequency Ordered Stop   06/27/18 1030  cefTRIAXone (ROCEPHIN) 1 g in sodium chloride 0.9 % 100 mL IVPB     1 g 200 mL/hr over 30 Minutes Intravenous Every 24 hours 06/27/18 1016     06/25/18 1400  aztreonam (AZACTAM) 1 g in sodium chloride 0.9 % 100 mL IVPB  Status:  Discontinued     1 g 200 mL/hr over 30 Minutes Intravenous Every 8 hours 06/25/18 1107 06/27/18 1016   06/25/18 0315  aztreonam (AZACTAM) 2 g in sodium chloride 0.9 % 100 mL IVPB     2 g 200 mL/hr over 30 Minutes Intravenous  Once 06/25/18 0306 06/25/18 0404      Assessment/Plan: Impression: Dysphasia, Parkinson's disease, seizure disorder, failure to thrive Plan: Patient is scheduled for a PEG tomorrow.  The risks and benefits of the procedure including bleeding, perforation, and the possibility of infection were fully explained to the patient's wife, who gave  informed consent for the patient.  LOS: 3 days    Aviva Signs 06/28/2018

## 2018-06-29 ENCOUNTER — Encounter (HOSPITAL_COMMUNITY)
Admission: EM | Disposition: A | Discharge status: Skilled Nursing Facility | Payer: Self-pay | Source: Skilled Nursing Facility | Attending: Internal Medicine

## 2018-06-29 ENCOUNTER — Inpatient Hospital Stay (HOSPITAL_COMMUNITY): Payer: Medicare HMO | Admitting: Anesthesiology

## 2018-06-29 ENCOUNTER — Encounter (HOSPITAL_COMMUNITY): Payer: Self-pay | Admitting: *Deleted

## 2018-06-29 DIAGNOSIS — R627 Adult failure to thrive: Secondary | ICD-10-CM

## 2018-06-29 DIAGNOSIS — N183 Chronic kidney disease, stage 3 unspecified: Secondary | ICD-10-CM

## 2018-06-29 DIAGNOSIS — N179 Acute kidney failure, unspecified: Secondary | ICD-10-CM

## 2018-06-29 DIAGNOSIS — E876 Hypokalemia: Secondary | ICD-10-CM

## 2018-06-29 DIAGNOSIS — N390 Urinary tract infection, site not specified: Secondary | ICD-10-CM

## 2018-06-29 DIAGNOSIS — I10 Essential (primary) hypertension: Secondary | ICD-10-CM

## 2018-06-29 DIAGNOSIS — E860 Dehydration: Secondary | ICD-10-CM

## 2018-06-29 HISTORY — PX: PEG PLACEMENT: SHX5437

## 2018-06-29 HISTORY — PX: ESOPHAGOGASTRODUODENOSCOPY (EGD) WITH PROPOFOL: SHX5813

## 2018-06-29 LAB — BASIC METABOLIC PANEL
ANION GAP: 6 (ref 5–15)
BUN: 16 mg/dL (ref 8–23)
CHLORIDE: 121 mmol/L — AB (ref 98–111)
CO2: 22 mmol/L (ref 22–32)
Calcium: 8.3 mg/dL — ABNORMAL LOW (ref 8.9–10.3)
Creatinine, Ser: 1.12 mg/dL (ref 0.61–1.24)
GFR calc Af Amer: >60 mL/min (ref >60)
GLUCOSE: 104 mg/dL — AB (ref 70–99)
POTASSIUM: 3.4 mmol/L — AB (ref 3.5–5.1)
Sodium: 149 mmol/L — ABNORMAL HIGH (ref 135–145)

## 2018-06-29 LAB — CBC
HEMATOCRIT: 35.3 % — AB (ref 39.0–52.0)
HEMOGLOBIN: 11.3 g/dL — AB (ref 13.0–17.0)
MCH: 31.7 pg (ref 26.0–34.0)
MCHC: 32 g/dL (ref 30.0–36.0)
MCV: 98.9 fL (ref 80.0–100.0)
Platelets: 321 10*3/uL (ref 150–400)
RBC: 3.57 MIL/uL — AB (ref 4.22–5.81)
RDW: 14 % (ref 11.5–15.5)
WBC: 10 10*3/uL (ref 4.0–10.5)
nRBC: 0 % (ref 0.0–0.2)

## 2018-06-29 LAB — URINE CULTURE
Culture: >=100000 — AB
SPECIAL REQUESTS: NORMAL

## 2018-06-29 LAB — SURGICAL PCR SCREEN
MRSA, PCR: NEGATIVE
STAPHYLOCOCCUS AUREUS: NEGATIVE

## 2018-06-29 SURGERY — INSERTION, PEG TUBE
Anesthesia: General

## 2018-06-29 MED ORDER — PRO-STAT SUGAR FREE PO LIQD
30.0000 mL | Freq: Every day | ORAL | Status: DC
  Administered 2018-06-29 – 2018-07-01 (×3): 30 mL
  Filled 2018-06-29 (×3): qty 30

## 2018-06-29 MED ORDER — PROPOFOL 10 MG/ML IV BOLUS
INTRAVENOUS | Status: AC
  Filled 2018-06-29: qty 20

## 2018-06-29 MED ORDER — PROPOFOL 500 MG/50ML IV EMUL
INTRAVENOUS | Status: DC | PRN
  Administered 2018-06-29: 35 ug/kg/min via INTRAVENOUS

## 2018-06-29 MED ORDER — DONEPEZIL HCL 5 MG PO TABS
5.0000 mg | ORAL_TABLET | Freq: Every day | ORAL | Status: DC
  Administered 2018-06-29 – 2018-06-30 (×2): 5 mg
  Filled 2018-06-29 (×2): qty 1

## 2018-06-29 MED ORDER — PROMETHAZINE HCL 25 MG/ML IJ SOLN: 6.2500 mg | INTRAMUSCULAR | Status: DC | PRN

## 2018-06-29 MED ORDER — CARBIDOPA-LEVODOPA 25-100 MG PO TABS
1.0000 | ORAL_TABLET | Freq: Three times a day (TID) | ORAL | Status: DC
  Administered 2018-06-29 – 2018-07-01 (×5): 1
  Filled 2018-06-29 (×5): qty 1

## 2018-06-29 MED ORDER — HYDROCODONE-ACETAMINOPHEN 7.5-325 MG PO TABS: 1.0000 | ORAL_TABLET | Freq: Once | ORAL | Status: DC | PRN

## 2018-06-29 MED ORDER — LACTATED RINGERS IV SOLN
INTRAVENOUS | Status: DC | PRN
  Administered 2018-06-29: 11:00:00 via INTRAVENOUS

## 2018-06-29 MED ORDER — OSMOLITE 1.5 CAL PO LIQD
1000.0000 mL | ORAL | Status: DC
  Administered 2018-06-29: 1000 mL
  Filled 2018-06-29 (×3): qty 1000

## 2018-06-29 MED ORDER — HYDROMORPHONE HCL 1 MG/ML IJ SOLN: 0.2500 mg | INTRAMUSCULAR | Status: DC | PRN

## 2018-06-29 MED ORDER — DEXTROSE 5 % IV SOLN
INTRAVENOUS | Status: DC | PRN
  Administered 2018-06-29: 1 g via INTRAVENOUS

## 2018-06-29 MED ORDER — FREE WATER
200.0000 mL | Status: DC
  Administered 2018-06-29 – 2018-07-01 (×12): 200 mL

## 2018-06-29 MED ORDER — OSMOLITE 1.5 CAL PO LIQD
1000.0000 mL | ORAL | Status: DC
  Filled 2018-06-29 (×2): qty 1000

## 2018-06-29 MED ORDER — LIDOCAINE 1 % OPTIME INJ - NO CHARGE
INTRAMUSCULAR | Status: DC | PRN
  Administered 2018-06-29: 5 mL via INTRADERMAL

## 2018-06-29 MED ORDER — MIDAZOLAM HCL 2 MG/2ML IJ SOLN: 0.5000 mg | Freq: Once | INTRAMUSCULAR | Status: DC | PRN

## 2018-06-29 MED ORDER — QUETIAPINE FUMARATE 25 MG PO TABS
50.0000 mg | ORAL_TABLET | Freq: Every day | ORAL | Status: DC
  Administered 2018-06-29 – 2018-06-30 (×2): 50 mg
  Filled 2018-06-29 (×2): qty 2

## 2018-06-29 MED ORDER — KETAMINE HCL 10 MG/ML IJ SOLN
INTRAMUSCULAR | Status: DC | PRN
  Administered 2018-06-29: 15 mg via INTRAVENOUS

## 2018-06-29 MED ORDER — GABAPENTIN 250 MG/5ML PO SOLN
100.0000 mg | Freq: Three times a day (TID) | ORAL | Status: DC
  Administered 2018-06-29 – 2018-07-01 (×5): 100 mg
  Filled 2018-06-29 (×15): qty 2

## 2018-06-29 MED ORDER — LACTATED RINGERS IV SOLN
INTRAVENOUS | Status: DC
  Administered 2018-06-29: 1000 mL via INTRAVENOUS

## 2018-06-29 NOTE — Anesthesia Preprocedure Evaluation (Signed)
Anesthesia Evaluation  Patient identified by MRN, date of birth, ID band Patient awake    Reviewed: Allergy & Precautions, NPO status , Patient's Chart, lab work & pertinent test results  Airway Mallampati: II  TM Distance: >3 FB Neck ROM: Full   Comment: Unable to cooperate for airway exam Dental no notable dental hx.    Pulmonary neg pulmonary ROS,    Pulmonary exam normal breath sounds clear to auscultation       Cardiovascular Exercise Tolerance: Poor hypertension, + angina + CAD and + Past MI  Normal cardiovascular examII Rhythm:Regular Rate:Normal  Wife states MI ~ 15 years ago Stented in 2012 per chart    Neuro/Psych Seizures -, Well Controlled,  Schizophrenia Wife states very minimally responsive for years  States hasnt walked in over 6 years  ? H/o OSA - wife denies any CPAP use  [facility]ies o[facility] Wife states non verbal for about 6 years  Opens eyes -o/w non purposeful CVA, Residual Symptoms negative psych ROS   GI/Hepatic negative GI ROS, Neg liver ROS,   Endo/Other  negative endocrine ROS  Renal/GU Renal InsufficiencyRenal disease  negative genitourinary   Musculoskeletal  (+) Arthritis ,   Abdominal   Peds negative pediatric ROS (+)  Hematology negative hematology ROS (+) anemia ,   Anesthesia Other Findings   Reproductive/Obstetrics negative OB ROS                             Anesthesia Physical Anesthesia Plan  ASA: IV  Anesthesia Plan: General   Post-op Pain Management:    Induction: Intravenous  PONV Risk Score and Plan:   Airway Management Planned: Nasal Cannula and Simple Face Mask  Additional Equipment:   Intra-op Plan:   Post-operative Plan:   Informed Consent:   Plan Discussed with:   Anesthesia Plan Comments: (D/w wife rescinding of DNR during procedure, wife WTP with same  )        Anesthesia Quick Evaluation

## 2018-06-29 NOTE — Progress Notes (Signed)
Initial Nutrition Assessment  DOCUMENTATION CODES:  Obesity unspecified  INTERVENTION:  Pt has not had intake x12 days. Has existing hypokalemia despite D5 ivf. Recommend drawing Mag/phos and closely monitoring electrolytes with initiation of TF given potential shifts.   Recommend thiamin injection to help facilitate the abrumpt change to anabolism/carbohydrate metabolism.   Begin Osmolite 1.5 @ 30 ml/hr via PEG. (50% goal) Hold at rate x12 hrs until new electrolytes levels are drawn.   Goal will be Osmolite 1.5 @60cc /hr, continuous w/ 30 ml Prostat q 24 hrs.   Tube feeding regimen provides 2260 kcal (100% of needs), 105 grams of protein, and 1097 ml of H2O.   To meet fluid needs, add 200 ml fluid bolus q 4 hrs.   NUTRITION DIAGNOSIS:  Inadequate oral intake related to inability to eat, chronic illness, dysphagia(Advanced Parkinsons) as evidenced by meal completion < 50%.  GOAL:  Patient will meet greater than or equal to 90% of their needs  MONITOR:  Labs, Weight trends, I & O's  REASON FOR ASSESSMENT:  Consult Enteral/tube feeding initiation and management, Assessment of nutrition requirement/status  ASSESSMENT:  72 y/o male PMHx Parkinson's, PAF, NSTEMI, CAD, HTN/HLD, CVA, Seizure Disorder. Presented from SNF w/ lethargy and worsening PO intake over past 3 weeks. Workup revealed hypernatremia, presumably d/t poor intake in setting of advanced parkinson's. Family elected for PEG, which was placed today. RD consulted for TF.  Per spouse at bedside, pt had essentially no intake the week PTA. He also has been NPO his entire hospitalization, equating to total of 12 days w/o nutrition. TF will be started gradually due to possible occurrence of electrolyte abnormalities. Though, he has been receiving Dextrose containing IVF which should lessen risk of drastic abnormalities.  Prior to loss of swallow function, she says the patient was on a regular texture diet. She notes patient has  understandbly been constipated recently, but typically does not have a hx of any n/v/c/d.   Spoke w/ ST. She noted patient was unable to elicit cough or swallow when cued, but did have a reflexive cough. She reports patient at moderate-severe aspiration risk-Patient will be most appropriate for continuous feeds.   Weight wise, the spouse says pt last weighed at the facility at 246 lbs. RD took bed weight today-was 257 lbs. No recent weights in chart to compare this to. Was ~275 lbs in 2017  RD reviewed TF plan. Explained rationale behind formula, rate, delivery method. Will initiate at low rate over 24 hrs initially to assess tolerance. As tolerates and electrolytes stabilize, can be cycled to improve QOL and reduce time on pump. This will likely occur after pt d/c from hospital. Reviewed how they can assess if patient is tolerating his feeds. Advised them to make sure patient head at >30 degrees during feeds.   Labs: Na: 151, K: 3.0, Bun/creat: 29/1.36, Bg:90-120, Albumin: 3.3, Albumin:15.6 Meds: Sinemet, Aricept, Seroquel, IV abx, IVF, IV H2RA,   Recent Labs  Lab 06/26/18 0841 06/27/18 0911 06/28/18 0818  NA 151* 153* 151*  K 3.5 3.2* 3.0*  CL 123* 123* 122*  CO2 24 21* 23  BUN 40* 37* 29*  CREATININE 1.49* 1.36* 1.36*  CALCIUM 8.4* 8.6* 8.6*  GLUCOSE 111* 90 116*     NUTRITION - FOCUSED PHYSICAL EXAM:   Most Recent Value  Orbital Region  No depletion  Upper Arm Region  No depletion  Thoracic and Lumbar Region  No depletion  Buccal Region  No depletion  Temple Region  No depletion  Clavicle Bone Region  No depletion  Clavicle and Acromion Bone Region  No depletion  Scapular Bone Region  No depletion  Dorsal Hand  No depletion  Patellar Region  No depletion  Anterior Thigh Region  No depletion  Posterior Calf Region  No depletion  Edema (RD Assessment)  None  Hair  Reviewed  Eyes  Reviewed  Mouth  Reviewed  Skin  Reviewed  Nails  Reviewed     Diet Order:   Diet  Order    None     EDUCATION NEEDS:  No education needs have been identified at this time  Skin:  S/P peg  Last BM:  10/21  Height:  Ht Readings from Last 1 Encounters:  06/29/18 6\' 2"  (1.88 m)   Weight:  Wt Readings from Last 1 Encounters:  06/29/18 116.6 kg   Wt Readings from Last 10 Encounters:  06/29/18 116.6 kg  06/15/16 124.7 kg  05/29/16 124.7 kg  01/27/16 124.9 kg  11/13/15 123.4 kg  03/12/15 117.9 kg  08/22/14 113.9 kg  01/03/14 115.7 kg  12/26/13 115.7 kg  10/02/13 115.3 kg   Ideal Body Weight:  86.36 kg  BMI:  Body mass index is 33 kg/m.  Estimated Nutritional Needs:  Kcal:  2000-2200 (17-19 kcal/kg bw) Protein:  95-112g Pro (1.1-1.3g/kg ibw) Fluid:  2-2.2 L (1 ml/kcal)  Burtis Junes RD, LDN, CNSC Clinical Nutrition Available Tues-Sat via Pager: [phone] 06/29/2018 2:02 PM  Burtis Junes RD, LDN, CNSC Clinical Nutrition Available Tues-Sat via Pager: [phone] 06/29/2018 2:02 PM

## 2018-06-29 NOTE — Interval H&P Note (Signed)
History and Physical Interval Note:  06/29/2018 9:53 AM  Mark [name]  has presented today for surgery, with the diagnosis of dysphagia  The various methods of treatment have been discussed with the patient and family. After consideration of risks, benefits and other options for treatment, the patient has consented to  Procedure(s): PERCUTANEOUS ENDOSCOPIC GASTROSTOMY (PEG) PLACEMENT (N/A) ESOPHAGOGASTRODUODENOSCOPY (EGD) WITH PROPOFOL (N/A) as a surgical intervention .  The patient's history has been reviewed, patient examined, no change in status, stable for surgery.  I have reviewed the patient's chart and labs.  Questions were answered to the patient's satisfaction.     Aviva Signs

## 2018-06-29 NOTE — Transfer of Care (Signed)
Immediate Anesthesia Transfer of Care Note  Patient: Mark [name]  Procedure(s) Performed: PERCUTANEOUS ENDOSCOPIC GASTROSTOMY (PEG) PLACEMENT (N/A ) ESOPHAGOGASTRODUODENOSCOPY (EGD) WITH PROPOFOL (N/A )  Patient Location: PACU  Anesthesia Type:MAC  Level of Consciousness: lethargic  Airway & Oxygen Therapy: Patient Spontanous Breathing and Patient connected to face mask oxygen  Post-op Assessment: Report given to RN  Post vital signs: Reviewed and stable  Last Vitals:  Vitals Value Taken Time  BP    Temp    Pulse    Resp    SpO2      Last Pain:  Vitals:   06/29/18 0830  TempSrc: Oral  PainSc:          Complications: No apparent anesthesia complications

## 2018-06-29 NOTE — Op Note (Signed)
Patient:  Mark [name]  DOB:  Jun 21, 1946  MRN:  [id number]   Preop Diagnosis: Dysphasia  Postop Diagnosis: Same  Procedure: EGD with PEG  Surgeon: Aviva Signs, MD  Assistant: Blake Divine, MD  Anes: MAC  Indications: Patient is a 72 year old white male with multiple medical problems including Parkinson's disease, dementia, and severe dysphasia who is not been able to maintain p.o. intake.  He is also unable to take his oral medications.  The patient now presents for a gastrostomy tube placement.  The risks and benefits of the procedure including bleeding, infection, and organ perforation were fully explained to the patient's wife, who gave informed consent for the patient as she has power of attorney.  Procedure note: The patient was placed in the supine position.  After monitored anesthesia care was given, the upper abdomen was prepped and draped using usual sterile technique with ChloraPrep.  Surgical site confirmation was performed.  An endoscope was advanced into the second portion of the duodenum without difficulty.  No ulcerations were noted in the duodenum.  The pylorus was noted to be widely patent.  No frank ulcerations were noted in the stomach.  An area in the left upper quadrant was then palpated and I was able to see the palpation freely in the antrum.  1% Xylocaine was used for local anesthesia.  An incision was made in the left upper quadrant and a needle catheter was advanced into the stomach under direct visualization without difficulty.  Guidewire was then passed and this was grasped using the snare and brought out through the mouth upon the removal of the endoscope.  A 20 French gastrostomy tube was then attached and using the pull technique, the gastrostomy tube was pulled through the mouth, stomach, and abdominal wall.  It was positioned at the skin level at the 4 cm Jeff Mccallum.  The endoscope was placed back into the stomach and confirmation of the bulb in the stomach was  confirmed.  All air was then evacuated from the stomach prior to removal of the endoscope.  A bolster was placed at the 5 cm Dona Walby.  Betadine ointment and a dry sterile dressing were applied.  Patient tolerated the procedure well was transferred to PACU in stable condition.  Complications: None  EBL: Minimal  Specimen: None

## 2018-06-29 NOTE — Anesthesia Postprocedure Evaluation (Signed)
Anesthesia Post Note  Patient: Mark [name]  Procedure(s) Performed: PERCUTANEOUS ENDOSCOPIC GASTROSTOMY (PEG) PLACEMENT (N/A ) ESOPHAGOGASTRODUODENOSCOPY (EGD) WITH PROPOFOL (N/A )  Patient location during evaluation: PACU Anesthesia Type: MAC Level of consciousness: lethargic and responds to stimulation Pain management: pain level controlled Vital Signs Assessment: post-procedure vital signs reviewed and stable Respiratory status: spontaneous breathing, nonlabored ventilation, respiratory function stable and patient connected to nasal cannula oxygen Cardiovascular status: blood pressure returned to baseline Postop Assessment: no apparent nausea or vomiting Anesthetic complications: no     Last Vitals:  Vitals:   06/29/18 1130 06/29/18 1203  BP: 134/64 131/68  Pulse: 63 66  Resp: 14   Temp:  36.8 C  SpO2: 95% 100%    Last Pain:  Vitals:   06/29/18 1203  TempSrc: Oral  PainSc:                  [name] J

## 2018-06-29 NOTE — Addendum Note (Signed)
Addendum  created 06/29/18 1522 by Ollen Bowl, CRNA   Intraprocedure Event edited

## 2018-06-29 NOTE — Progress Notes (Signed)
PROGRESS NOTE  Mark [name] HQI:[id number] DOB: 1946-02-20 DOA: 06/25/2018 PCP: Caprice Renshaw, MD  Brief History:  72 year old male with a history of Parkinson's disease, hypertension, coronary artery disease, seizure disorder, presented with lethargy and decreased oral intake.  The patient has resided in a skilled nursing facility for the past 6 years.  Patient has had increasing generalized weakness to the point of being bedridden for the past 3 weeks prior to admission.  The patient was previously able to sit in a shower chair for shower, but has been too weak to sit up for the past 3 weeks.  At the time presentation, the patient was noted to have acute on chronic renal failure with a serum sodium 154.  The patient was started on IV fluids.  Urinalysis showed greater than 50 WBCs.  The patient was started on ceftriaxone.  Assessment/Plan: Failure to thrive/dehydration/hypernatremia -After discussion with palliative medicine--patient and family agreed to gastrostomy tube placement -06/19/18--PEG placed -Start enteral feeding -Continue hypotonic IV fluid  UTI -Urine culture shows Proteus mirabilis -Continue ceftriaxone  Acute on chronic renal failure--CKD stage 3 -Baseline creatinine 1.1-1.3 -Serum creatinine peaked at 2.13 -Continue IV fluids -holding lisinopril  Paroxysmal Atrial Fibrilliation -continue apixaban when tolerating enteral feedings -presently in sinus -personally reviewed EKG--sinus, nonspecific T wave changes  Essential hypertension -Continue IV metoprolol and clonidine TTS -convert back to po meds when tolerating enteral feedings  Advanced Parkinson's disease -Follow-up with Dr. Krista Blue -Family wanted PEG tube placement to further support hydration, nutrition and provide medications. -They understand that a PEG tube will no change the poor prognosis of his severe Parkinson disease or change risk for aspiration -restart sinemet when PEG  functional  Seizure disorder -Continue IV Depacon  pressure injury -Affecting lower sacrum and buttocks -stage I mainly -Continue preventive measures and constant repositioning  Hypokalemia -replete -IVF with KCl -check mag     Disposition Plan:   SNF 1-2 days Family Communication:   Spouse updated at bedside 10/23  Consultants:  General surgery  Code Status:  DNR  DVT Prophylaxis:  apixaban--on hold for surgery   Procedures: As Listed in Progress Note Above  Antibiotics: Aztreonam 10/19>>>10/21 Ceftriaxone 10/21>>>    Subjective: Pt awake and alert. Denies cp, sob, n/v/d.  No abd pain.  No fever or chills.  No dysuria or hematuria.    Objective: Vitals:   06/29/18 1303 06/29/18 1344 06/29/18 1417 06/29/18 1533  BP: 122/66  129/67 (!) 150/75  Pulse: 70  72 68  Resp:      Temp: 98.6 F (37 C)  98.6 F (37 C) 98.8 F (37.1 C)  TempSrc: Oral  Oral Oral  SpO2: 97%  97% 97%  Weight:  116.6 kg    Height:        Intake/Output Summary (Last 24 hours) at 06/29/2018 1552 Last data filed at 06/29/2018 1519 Gross per 24 hour  Intake 1863.46 ml  Output 200 ml  Net 1663.46 ml   Weight change:  Exam:   General:  Pt is alert, follows commands appropriately, not in acute distress  HEENT: No icterus, No thrush, No neck mass, [place]/AT  Cardiovascular: RRR, S1/S2, no rubs, no gallops  Respiratory: bibasilar crackles, no wheeze  Abdomen: Soft/+BS, non tender, non distended, no guarding  Extremities: 1+LE edema, No lymphangitis, No petechiae, No rashes, no synovitis   Data Reviewed: I have personally reviewed following labs and imaging studies Basic Metabolic Panel: Recent Labs  Lab  06/25/18 0206 06/25/18 0433 06/26/18 0841 06/27/18 0911 06/28/18 0818  NA 151* 154* 151* 153* 151*  K 3.8 3.6 3.5 3.2* 3.0*  CL 115* 117* 123* 123* 122*  CO2 27 26 24  21* 23  GLUCOSE 124* 133* 111* 90 116*  BUN 45* 45* 40* 37* 29*  CREATININE 2.13* 1.98* 1.49* 1.36*  1.36*  CALCIUM 8.8* 8.6* 8.4* 8.6* 8.6*   Liver Function Tests: Recent Labs  Lab 06/25/18 0206 06/25/18 0433  AST 17 14*  ALT 17 17  ALKPHOS 60 57  BILITOT 1.0 1.0  PROT 8.1 7.5  ALBUMIN 3.4* 3.3*   No results for input(s): LIPASE, AMYLASE in the last 168 hours. No results for input(s): AMMONIA in the last 168 hours. Coagulation Profile: No results for input(s): INR, PROTIME in the last 168 hours. CBC: Recent Labs  Lab 06/25/18 0206 06/25/18 0433 06/29/18 1538  WBC 14.3* 15.6* 10.0  NEUTROABS 10.3*  --   --   HGB 12.4* 12.2* 11.3*  HCT 41.1 39.6 35.3*  MCV 100.5* 102.6* 98.9  PLT 355 331 321   Cardiac Enzymes: No results for input(s): CKTOTAL, CKMB, CKMBINDEX, TROPONINI in the last 168 hours. BNP: Invalid input(s): POCBNP CBG: No results for input(s): GLUCAP in the last 168 hours. HbA1C: No results for input(s): HGBA1C in the last 72 hours. Urine analysis:    Component Value Date/Time   COLORURINE YELLOW 06/25/2018 0218   APPEARANCEUR HAZY (A) 06/25/2018 0218   LABSPEC 1.019 06/25/2018 0218   PHURINE 7.0 06/25/2018 0218   GLUCOSEU NEGATIVE 06/25/2018 0218   HGBUR MODERATE (A) 06/25/2018 0218   BILIRUBINUR NEGATIVE 06/25/2018 [place] 06/25/2018 0218   PROTEINUR 30 (A) 06/25/2018 0218   UROBILINOGEN 0.2 12/26/2013 0817   NITRITE NEGATIVE 06/25/2018 0218   LEUKOCYTESUR LARGE (A) 06/25/2018 0218   Sepsis Labs: @LABRCNTIP (procalcitonin:4,lacticidven:4) ) Recent Results (from the past 240 hour(s))  Urine culture     Status: Abnormal   Collection Time: 06/25/18  2:33 AM  Result Value Ref Range Status   Specimen Description   Final    URINE, CATHETERIZED Performed at [facility], [address] [place], [place] [address]    Special Requests   Final    Normal Performed at [facility], [address] [place], [place] [address]    Culture >=100,000 COLONIES/mL PROTEUS MIRABILIS (A)  Final   Report Status 06/29/2018 FINAL  Final    Organism ID, Bacteria PROTEUS MIRABILIS (A)  Final      Susceptibility   Proteus mirabilis - MIC*    AMPICILLIN <=2 SENSITIVE Sensitive     CEFAZOLIN <=4 SENSITIVE Sensitive     CEFTRIAXONE <=1 SENSITIVE Sensitive     CIPROFLOXACIN >=4 RESISTANT Resistant     GENTAMICIN <=1 SENSITIVE Sensitive     IMIPENEM 1 SENSITIVE Sensitive     NITROFURANTOIN 128 RESISTANT Resistant     TRIMETH/SULFA <=20 SENSITIVE Sensitive     AMPICILLIN/SULBACTAM <=2 SENSITIVE Sensitive     PIP/TAZO <=4 SENSITIVE Sensitive     * >=100,000 COLONIES/mL PROTEUS MIRABILIS  MRSA PCR Screening     Status: None   Collection Time: 06/25/18  4:33 PM  Result Value Ref Range Status   MRSA by PCR NEGATIVE NEGATIVE Final    Comment:        The GeneXpert MRSA Assay (FDA approved for NASAL specimens only), is one component of a comprehensive MRSA colonization surveillance program. It is not intended to diagnose MRSA infection nor to guide or monitor  treatment for MRSA infections. Performed at [facility], [address], [place], [place] [address]   Surgical pcr screen     Status: None   Collection Time: 06/29/18  8:30 AM  Result Value Ref Range Status   MRSA, PCR NEGATIVE NEGATIVE Final   Staphylococcus aureus NEGATIVE NEGATIVE Final    Comment: (NOTE) The Xpert SA Assay (FDA approved for NASAL specimens in patients 50 years of age and older), is one component of a comprehensive surveillance program. It is not intended to diagnose infection nor to guide or monitor treatment. Performed at [facility], [address], [place], [place] [address]      Scheduled Meds: . carbidopa-levodopa  1 tablet Oral TID  . chlorhexidine  15 mL Mouth Rinse BID  . cloNIDine  0.1 mg Transdermal Weekly  . donepezil  5 mg Oral QHS  . DULoxetine  20 mg Oral BID  . feeding supplement (OSMOLITE 1.5 CAL)  1,000 mL Per Tube Q24H  . feeding supplement (PRO-STAT SUGAR FREE 64)  30 mL Per Tube Daily  . free water  200 mL Per Tube Q4H   . gabapentin  100 mg Oral TID  . mouth rinse  15 mL Mouth Rinse q12n4p  . metoprolol tartrate  2.5 mg Intravenous Q12H  . QUEtiapine  50 mg Oral QHS  . sertraline  100 mg Oral Daily   Continuous Infusions: . cefTRIAXone (ROCEPHIN)  IV Stopped (06/28/18 1059)  . dextrose 5 % with kcl 100 mL/hr at 06/29/18 1241  . famotidine (PEPCID) IV 20 mg (06/29/18 1427)  . valproate sodium 500 mg (06/28/18 2116)    Procedures/Studies: Dg Chest Port 1 View  Result Date: 06/25/2018 CLINICAL DATA:  Altered mental status EXAM: PORTABLE CHEST 1 VIEW COMPARISON:  03/12/2015 FINDINGS: Low lung volumes. Borderline cardiomegaly with aortic atherosclerosis. No focal opacity or pleural effusion. No pneumothorax. IMPRESSION: No active disease.  Borderline cardiomegaly Electronically Signed   By: Donavan Foil M.D.   On: 06/25/2018 02:10    Orson Eva, DO  [facility] Pager [phone]  If 7PM-7AM, please contact night-coverage [email or web address] Password [email or web address] 06/29/2018, 3:52 PM   LOS: 4 days

## 2018-06-30 ENCOUNTER — Encounter (HOSPITAL_COMMUNITY): Payer: Self-pay

## 2018-06-30 LAB — BASIC METABOLIC PANEL
Anion gap: 7 (ref 5–15)
BUN: 14 mg/dL (ref 8–23)
CHLORIDE: 117 mmol/L — AB (ref 98–111)
CO2: 21 mmol/L — ABNORMAL LOW (ref 22–32)
Calcium: 8.1 mg/dL — ABNORMAL LOW (ref 8.9–10.3)
Creatinine, Ser: 1.03 mg/dL (ref 0.61–1.24)
GFR calc non Af Amer: >60 mL/min (ref >60)
Glucose, Bld: 118 mg/dL — ABNORMAL HIGH (ref 70–99)
POTASSIUM: 3.4 mmol/L — AB (ref 3.5–5.1)
SODIUM: 145 mmol/L (ref 135–145)

## 2018-06-30 LAB — PHOSPHORUS: Phosphorus: 2.3 mg/dL — ABNORMAL LOW (ref 2.5–4.6)

## 2018-06-30 LAB — MAGNESIUM: Magnesium: 2.3 mg/dL (ref 1.7–2.4)

## 2018-06-30 MED ORDER — FREE WATER: 200.0000 mL | Status: DC

## 2018-06-30 MED ORDER — CARVEDILOL 3.125 MG PO TABS
3.1250 mg | ORAL_TABLET | Freq: Two times a day (BID) | ORAL | Status: DC
  Administered 2018-06-30 – 2018-07-01 (×2): 3.125 mg
  Filled 2018-06-30 (×2): qty 1

## 2018-06-30 MED ORDER — CEFDINIR 125 MG/5ML PO SUSR: 300.0000 mg | Freq: Two times a day (BID) | ORAL | Status: DC

## 2018-06-30 MED ORDER — CEFDINIR 125 MG/5ML PO SUSR
300.0000 mg | Freq: Two times a day (BID) | ORAL | Status: DC
  Filled 2018-06-30 (×2): qty 15

## 2018-06-30 MED ORDER — OSMOLITE 1.5 CAL PO LIQD: ORAL | 99 refills | Status: DC

## 2018-06-30 MED ORDER — FAMOTIDINE 40 MG/5ML PO SUSR
20.0000 mg | Freq: Every day | ORAL | Status: DC
  Filled 2018-06-30 (×4): qty 2.5

## 2018-06-30 MED ORDER — OSMOLITE 1.5 CAL PO LIQD: 1000.0000 mL | ORAL | Status: DC

## 2018-06-30 MED ORDER — CEFIXIME 100 MG/5ML PO SUSR
200.0000 mg | Freq: Two times a day (BID) | ORAL | Status: DC
  Administered 2018-06-30 – 2018-07-01 (×2): 200 mg
  Filled 2018-06-30 (×8): qty 10

## 2018-06-30 MED ORDER — APIXABAN 5 MG PO TABS
5.0000 mg | ORAL_TABLET | Freq: Two times a day (BID) | ORAL | Status: DC
  Administered 2018-06-30 – 2018-07-01 (×2): 5 mg
  Filled 2018-06-30 (×2): qty 1

## 2018-06-30 MED ORDER — PRO-STAT SUGAR FREE PO LIQD: 30.0000 mL | Freq: Every day | ORAL | 0 refills | Status: DC

## 2018-06-30 MED ORDER — POTASSIUM CHLORIDE 2 MEQ/ML IV SOLN: INTRAVENOUS | Status: DC

## 2018-06-30 MED ORDER — K PHOS MONO-SOD PHOS DI & MONO 155-852-130 MG PO TABS
500.0000 mg | ORAL_TABLET | Freq: Three times a day (TID) | ORAL | Status: AC
  Administered 2018-06-30 – 2018-07-01 (×3): 500 mg
  Filled 2018-06-30 (×3): qty 2

## 2018-06-30 MED ORDER — OSMOLITE 1.5 CAL PO LIQD
1000.0000 mL | ORAL | Status: DC
  Administered 2018-06-30: 1000 mL
  Filled 2018-06-30 (×7): qty 1000

## 2018-06-30 NOTE — Progress Notes (Signed)
Nutrition Follow-up  DOCUMENTATION CODES:  Obesity unspecified  INTERVENTION:  Increase Osmolite 1.5 to 60 ml/hr via PEG. (50% goal) Hold at rate x12 hrs until new electrolytes levels are drawn.   Phos slightly low, would recommend checking again tomorrow.   Planned D/C regimen Osmolite 1.5 '@60cc'$ /hr, continuous w/ 30 ml Prostat q 24 hrs. +200 ml Free water q4 hrs.   Regimen provides 2260 kcal (100% of needs), 105 grams of protein, and 1097 ml of H2O. +1200 from fluid boluses  NUTRITION DIAGNOSIS:  Inadequate oral intake related to inability to eat, dysphagia, chronic illness as evidenced by meal completion < 50%.  Ongoing; chronic   GOAL:  Patient will meet greater than or equal to 90% of their needs   Not met; TF meeting <50% needs  MONITOR:  Labs, Weight trends, I & O's  ASSESSMENT:  72 y/o male PMHx Parkinson's, PAF, NSTEMI, CAD, HTN/HLD, CVA, Seizure Disorder. Presented from SNF w/ lethargy and worsening PO intake over past 3 weeks. Workup revealed hypernatremia, presumably d/t poor intake in setting of advanced parkinson's. Family elected for PEG, which was placed today. RD consulted for TF.  Patient started on TF yesterday afternoon, rate was 50% goal. On follow up this morning, he seems to have tolerated the feeding well, though somewhat difficult to determine given his limited ability to communicate. RN did not report any episodes of diarrhea/vomiting, though he did have a BM yesterday. Spouse says the patient intermittently sounded "gurgly" last night. She says he hadnt sounded like this any nights before. However, when asking if patient has felt he is having regurgitation or able to taste the feedings, he says "no".    Hypernatremia improved. Potassium stable. Mag came back wdl and phos just below normal limits.   With PEG, patient now able to received medications.   Will increase to goal of 60 cc/hr and follow up again tomorrow. RD noted to spouse that if patient  tolerates well, he could likely be cycled to 16 hrs at 90 to give pt some time off pump. This could be done by MD/PA at [facility] Would be hesitant to increase much further d/t severe aspiration risk.   Labs: Na: 145, K: 3.4, Bun/creat: 14/1.03, Bg:90-120, Albumin: 3.3,  Meds: Sinemet, Aricept, Seroquel, IV abx, IVF, IV H2RA,   Recent Labs  Lab 06/28/18 0818 06/29/18 1538 06/30/18 0530  NA 151* 149* 145  K 3.0* 3.4* 3.4*  CL 122* 121* 117*  CO2 23 22 21*  BUN 29* 16 14  CREATININE 1.36* 1.12 1.03  CALCIUM 8.6* 8.3* 8.1*  MG  --   --  2.3  PHOS  --   --  2.3*  GLUCOSE 116* 104* 118*   Diet Order:   Diet Order    None     EDUCATION NEEDS:  No education needs have been identified at this time  Skin:  S/P peg  Last BM:  10/23  Height:  Ht Readings from Last 1 Encounters:  06/29/18 '6\' 2"'$  (1.88 m)   Weight:  Wt Readings from Last 1 Encounters:  06/29/18 116.6 kg   Wt Readings from Last 10 Encounters:  06/29/18 116.6 kg  06/15/16 124.7 kg  05/29/16 124.7 kg  01/27/16 124.9 kg  11/13/15 123.4 kg  03/12/15 117.9 kg  08/22/14 113.9 kg  01/03/14 115.7 kg  12/26/13 115.7 kg  10/02/13 115.3 kg   Ideal Body Weight:  86.36 kg  BMI:  Body mass index is 33 kg/m.  Estimated Nutritional Needs:  Kcal:  2000-2200 (17-19 kcal/kg bw) Protein:  95-112g Pro (1.1-1.3g/kg ibw) Fluid:  2-2.2 L (1 ml/kcal)  Burtis Junes RD, LDN, CNSC Clinical Nutrition Available Tues-Sat via Pager: [phone] 06/30/2018 12:07 PM  Burtis Junes RD, LDN, CNSC Clinical Nutrition Available Tues-Sat via Pager: [phone] 06/30/2018 12:07 PM

## 2018-06-30 NOTE — Progress Notes (Signed)
PROGRESS NOTE  MERIL DRAY ERX:[id number] DOB: [birth year] DOA: 06/25/2018 PCP: Caprice Renshaw, MD  Brief History:  72 year old male with a history of Parkinson's disease, hypertension, coronary artery disease, seizure disorder, presented with lethargy and decreased oral intake.  The patient has resided in a skilled nursing facility for the past 6 years.  Patient has had increasing generalized weakness to the point of being bedridden for the past 3 weeks prior to admission.  The patient was previously able to sit in a shower chair for shower, but has been too weak to sit up for the past 3 weeks.  At the time presentation, the patient was noted to have acute on chronic renal failure with a serum sodium 154.  The patient was started on IV fluids.  Urinalysis showed greater than 50 WBCs.  The patient was started on ceftriaxone.  Assessment/Plan: Failure to thrive/dehydration/hypernatremia -After discussion with palliative medicine--patient and family agreed to gastrostomy tube placement -06/19/18--PEG placed -Start enteral feeding--Osmolite 1.5@60  cc/hr -continue free water 200 cc/ hr -Continue hypotonic IV fluid  UTI -Urine culture shows Proteus mirabilis -Continue ceftriaxone>>>cefixime per PEG  Acute on chronic renal failure--CKD stage 3 -Baseline creatinine 1.1-1.3 -Serum creatinine peaked at 2.13 -Continue IV fluids -holding lisinopril  Paroxysmal Atrial Fibrilliation -continue apixaban when tolerating enteral feedings -presently in sinus -personally reviewed EKG--sinus, nonspecific T wave changes  Essential hypertension -Continue IV metoprolol>>>restart coreg -converted back to po meds when tolerating enteral feedings -d/c clonidine TTS due to soft BP  Advanced Parkinson's disease -Follow-up with Dr. Krista Blue -Family wanted PEG tube placement to further support hydration, nutrition and provide medications. -They understand that a PEG tube will no change the poor  prognosis of his severe Parkinson disease or change risk for aspiration -restarted sinemet   -continue osmolite 1.5@60  cc/hr  Seizure disorder -Continue IV Depacon>>>depakote sprinkles  pressure injury -Affecting lower sacrum and buttocks -stage I mainly -Continue preventive measures and constant repositioning  Hypokalemia/Hypophosphatemia -replete -IVF with KCl -check mag--2.3     Disposition Plan:   SNF 10/25 if stable Family Communication:   Spouse updated at bedside 10/23  Consultants:  General surgery  Code Status:  DNR  DVT Prophylaxis:  apixaban--on hold for surgery   Procedures: As Listed in Progress Note Above  Antibiotics: Aztreonam 10/19>>>10/21 Ceftriaxone 10/21>>>    Subjective: Pt tolerating enteral feeding.  Denies cp, sob, n/v, abd pain.  No fever chills, no dysuria  Objective: Vitals:   06/30/18 0558 06/30/18 0850 06/30/18 0900 06/30/18 1414  BP: 103/64  (!) 114/54 103/75  Pulse: 71  66 78  Resp: 19  18 18   Temp: 99.3 F (37.4 C)  (!) 97.5 F (36.4 C) 99.7 F (37.6 C)  TempSrc: Oral  Axillary Axillary  SpO2: 100% 96% 99% 97%  Weight:      Height:        Intake/Output Summary (Last 24 hours) at 06/30/2018 1722 Last data filed at 06/30/2018 1500 Gross per 24 hour  Intake 2292.97 ml  Output 750 ml  Net 1542.97 ml   Weight change:  Exam:   General:  Pt is alert, follows commands appropriately, not in acute distress  HEENT: No icterus, No thrush, No neck mass, [place]/AT  Cardiovascular: RRR, S1/S2, no rubs, no gallops  Respiratory: diminished BS at bases.  No wheeze  Abdomen: Soft/+BS, non tender, non distended, no guarding  Extremities: 1+LE edema, No lymphangitis, No petechiae, No rashes, no synovitis   Data Reviewed:  I have personally reviewed following labs and imaging studies Basic Metabolic Panel: Recent Labs  Lab 06/26/18 0841 06/27/18 0911 06/28/18 0818 06/29/18 1538 06/30/18 0530  NA 151* 153*  151* 149* 145  K 3.5 3.2* 3.0* 3.4* 3.4*  CL 123* 123* 122* 121* 117*  CO2 24 21* 23 22 21*  GLUCOSE 111* 90 116* 104* 118*  BUN 40* 37* 29* 16 14  CREATININE 1.49* 1.36* 1.36* 1.12 1.03  CALCIUM 8.4* 8.6* 8.6* 8.3* 8.1*  MG  --   --   --   --  2.3  PHOS  --   --   --   --  2.3*   Liver Function Tests: Recent Labs  Lab 06/25/18 0206 06/25/18 0433  AST 17 14*  ALT 17 17  ALKPHOS 60 57  BILITOT 1.0 1.0  PROT 8.1 7.5  ALBUMIN 3.4* 3.3*   No results for input(s): LIPASE, AMYLASE in the last 168 hours. No results for input(s): AMMONIA in the last 168 hours. Coagulation Profile: No results for input(s): INR, PROTIME in the last 168 hours. CBC: Recent Labs  Lab 06/25/18 0206 06/25/18 0433 06/29/18 1538  WBC 14.3* 15.6* 10.0  NEUTROABS 10.3*  --   --   HGB 12.4* 12.2* 11.3*  HCT 41.1 39.6 35.3*  MCV 100.5* 102.6* 98.9  PLT 355 331 321   Cardiac Enzymes: No results for input(s): CKTOTAL, CKMB, CKMBINDEX, TROPONINI in the last 168 hours. BNP: Invalid input(s): POCBNP CBG: No results for input(s): GLUCAP in the last 168 hours. HbA1C: No results for input(s): HGBA1C in the last 72 hours. Urine analysis:    Component Value Date/Time   COLORURINE YELLOW 06/25/2018 0218   APPEARANCEUR HAZY (A) 06/25/2018 0218   LABSPEC 1.019 06/25/2018 0218   PHURINE 7.0 06/25/2018 0218   GLUCOSEU NEGATIVE 06/25/2018 0218   HGBUR MODERATE (A) 06/25/2018 0218   BILIRUBINUR NEGATIVE 06/25/2018 0218   KETONESUR NEGATIVE 06/25/2018 0218   PROTEINUR 30 (A) 06/25/2018 0218   UROBILINOGEN 0.2 12/26/2013 0817   NITRITE NEGATIVE 06/25/2018 0218   LEUKOCYTESUR LARGE (A) 06/25/2018 0218   Sepsis Labs: @LABRCNTIP (procalcitonin:4,lacticidven:4) ) Recent Results (from the past 240 hour(s))  Urine culture     Status: Abnormal   Collection Time: 06/25/18  2:33 AM  Result Value Ref Range Status   Specimen Description   Final    URINE, CATHETERIZED Performed at [facility], [address], [place], [place] [address]    Special Requests   Final    Normal Performed at [facility], [address], [place], [place] [address]    Culture >=100,000 COLONIES/mL PROTEUS MIRABILIS (A)  Final   Report Status 06/29/2018 FINAL  Final   Organism ID, Bacteria PROTEUS MIRABILIS (A)  Final      Susceptibility   Proteus mirabilis - MIC*    AMPICILLIN <=2 SENSITIVE Sensitive     CEFAZOLIN <=4 SENSITIVE Sensitive     CEFTRIAXONE <=1 SENSITIVE Sensitive     CIPROFLOXACIN >=4 RESISTANT Resistant     GENTAMICIN <=1 SENSITIVE Sensitive     IMIPENEM 1 SENSITIVE Sensitive     NITROFURANTOIN 128 RESISTANT Resistant     TRIMETH/SULFA <=20 SENSITIVE Sensitive     AMPICILLIN/SULBACTAM <=2 SENSITIVE Sensitive     PIP/TAZO <=4 SENSITIVE Sensitive     * >=100,000 COLONIES/mL PROTEUS MIRABILIS  MRSA PCR Screening     Status: None   Collection Time: 06/25/18  4:33 PM  Result Value Ref Range Status   MRSA by PCR NEGATIVE  NEGATIVE Final    Comment:        The GeneXpert MRSA Assay (FDA approved for NASAL specimens only), is one component of a comprehensive MRSA colonization surveillance program. It is not intended to diagnose MRSA infection nor to guide or monitor treatment for MRSA infections. Performed at [facility], [address] [place], [place] [address]   Surgical pcr screen     Status: None   Collection Time: 06/29/18  8:30 AM  Result Value Ref Range Status   MRSA, PCR NEGATIVE NEGATIVE Final   Staphylococcus aureus NEGATIVE NEGATIVE Final    Comment: (NOTE) The Xpert SA Assay (FDA approved for NASAL specimens in patients 49 years of age and older), is one component of a comprehensive surveillance program. It is not intended to diagnose infection nor to guide or monitor treatment. Performed at [facility], [address] [place], [place] [address]      Scheduled Meds: . carbidopa-levodopa  1 tablet Per Tube TID  . carvedilol  3.125 mg Per Tube BID WC  . cefdinir  300 mg Per  Tube BID  . chlorhexidine  15 mL Mouth Rinse BID  . cloNIDine  0.1 mg Transdermal Weekly  . donepezil  5 mg Per Tube QHS  . DULoxetine  20 mg Oral BID  . [START ON 07/01/2018] famotidine  20 mg Per Tube Daily  . feeding supplement (PRO-STAT SUGAR FREE 64)  30 mL Per Tube Daily  . free water  200 mL Per Tube Q4H  . gabapentin  100 mg Per Tube Q8H  . mouth rinse  15 mL Mouth Rinse q12n4p  . phosphorus  500 mg Per Tube TID  . QUEtiapine  50 mg Per Tube QHS  . sertraline  100 mg Oral Daily   Continuous Infusions: . feeding supplement (OSMOLITE 1.5 CAL) 1,000 mL (06/30/18 1023)  . valproate sodium 500 mg (06/30/18 0931)    Procedures/Studies: Dg Chest Port 1 View  Result Date: 06/25/2018 CLINICAL DATA:  Altered mental status EXAM: PORTABLE CHEST 1 VIEW COMPARISON:  03/12/2015 FINDINGS: Low lung volumes. Borderline cardiomegaly with aortic atherosclerosis. No focal opacity or pleural effusion. No pneumothorax. IMPRESSION: No active disease.  Borderline cardiomegaly Electronically Signed   By: Donavan Foil M.D.   On: 06/25/2018 02:10    Orson Eva, DO  [facility] Pager [phone]  If 7PM-7AM, please contact night-coverage [email or web address] Password [email or web address] 06/30/2018, 5:22 PM   LOS: 5 days

## 2018-06-30 NOTE — Progress Notes (Signed)
Peg tube infusing and flushing well.  Has had several loose stools today. HOB 30 degrees.  Wife or sister in law have been at bedside today.

## 2018-06-30 NOTE — Discharge Summary (Signed)
Physician Discharge Summary  Mark [name] QIO:[id number] DOB: Mar 03, 1946 DOA: 06/25/2018  PCP: Caprice Renshaw, MD  Admit date: 06/25/2018 Discharge date: 07/01/2018  Admitted From: SNF Disposition:  SNF  Recommendations for Outpatient Follow-up:  1. Follow up with PCP in 1-2 weeks 2. Please obtain BMP/CBC, mag and phos in one week    Discharge Condition: Stable CODE STATUS: DNR Diet recommendation: Osmolite 1.5 @ 60 cc/hr with pleasure feedings Free water flushes  Brief/Interim Summary: 72 year old male with a history of Parkinson's disease, hypertension, coronary artery disease, seizure disorder, presented with lethargy and decreased oral intake. The patient has resided in a skilled nursing facility for the past 6 years. Patient has had increasing generalized weakness to the point of being bedridden for the past 3 weeks prior to admission. The patient was previously able to sit in a shower chair for shower, but has been too weak to sit up for the past 3 weeks. At the time presentation, the patient was noted to have acute on chronic renal failure with a serum sodium 154. The patient was started on IV fluids. Urinalysis showed greater than 50 WBCs. The patient was started on ceftriaxone initially.  Discharge Diagnoses:  Failure to thrive/dehydration/hypernatremia -After discussion with palliative medicine--patient and family agreed to gastrostomy tube placement -06/19/18--PEG placed -Started enteral feeding--Osmolite 1.5@60  cc/hr-->pt tolerated -continue free water 200 cc q 4 hours -Continue hypotonic IV fluid  UTI -Urine culture shows Proteus mirabilis -Continue ceftriaxone>>>cefixime per PEG x 3 more days  Acute on chronic renal failure--CKD stage 3 -Baseline creatinine 1.1-1.3 -Serum creatinine peaked at 2.13 -Continue IV fluids -holding lisinopril  Paroxysmal Atrial Fibrilliation -continue apixabanwhen tolerating enteral feedings -presently in  sinus -personally reviewed EKG--sinus, nonspecific T wave changes  Essential hypertension -Continue IV metoprolol>>>restart coreg -converted back to po meds when tolerating enteral feedings -d/c clonidine TTS due to soft BP  Advanced Parkinson's disease -Follow-up withDr. Krista Blue -Family wantedPEG tube placement to further support hydration, nutrition and provide medications. -They understand that a PEG tube will no change the poor prognosis of his severe Parkinson disease or change risk for aspiration -restarted sinemet   -continue osmolite 1.5@60  cc/hr  Seizure disorder history -pt was not any antiepileptic medication according to [facility] from [place]  pressure injury -Affecting lower sacrum and buttocks -stage I mainly -Continue preventive measures and constant repositioning  Hypokalemia/Hypophosphatemia -replete -IVF with KCl -check mag--2.3 -Kphos bid--recheck K and Phos in one week   Discharge Instructions   Allergies as of 07/01/2018      Reactions   Keppra [levetiracetam] Other (See Comments)   Increased agitation   Ciprofloxacin Rash      Medication List    STOP taking these medications   cloNIDine 0.1 MG tablet Commonly known as:  CATAPRES   cloNIDine 0.1 mg/24hr patch Commonly known as:  CATAPRES - Dosed in mg/24 hr   diphenhydrAMINE 25 MG tablet Commonly known as:  BENADRYL   lisinopril 5 MG tablet Commonly known as:  PRINIVIL,ZESTRIL   LORazepam 0.5 MG tablet Commonly known as:  ATIVAN   Melatonin 5 MG Caps   oxymetazoline 0.05 % nasal spray Commonly known as:  AFRIN     TAKE these medications   atorvastatin 40 MG tablet Commonly known as:  LIPITOR Take 40 mg by mouth daily.   bisacodyl 10 MG suppository Commonly known as:  DULCOLAX Place 10 mg rectally as needed for moderate constipation.   carbidopa-levodopa 25-100 MG tablet Commonly known as:  SINEMET IR Take 2 tablets by  mouth 3 (three) times daily.   carvedilol 3.125  MG tablet Commonly known as:  COREG Take 1 tablet (3.125 mg total) by mouth 2 (two) times daily with a meal.   cefixime 100 MG/5ML suspension Commonly known as:  SUPRAX Place 10 mLs (200 mg total) into feeding tube every 12 (twelve) hours. X 3 days   colchicine 0.6 MG tablet Take 0.6 mg by mouth daily.   donepezil 5 MG tablet Commonly known as:  ARICEPT Take 5 mg by mouth at bedtime.   DULoxetine 20 MG capsule Commonly known as:  CYMBALTA Take 20 mg by mouth 2 (two) times daily.   ELIQUIS 5 MG Tabs tablet Generic drug:  apixaban Take 5 mg by mouth 2 (two) times daily.   famotidine 20 MG tablet Commonly known as:  PEPCID Take 20 mg by mouth as needed for heartburn or indigestion.   feeding supplement (OSMOLITE 1.5 CAL) Liqd Infuse through PEG @ 60 cc/hr   feeding supplement (PRO-STAT SUGAR FREE 64) Liqd Place 30 mLs into feeding tube daily.   ferrous sulfate 325 (65 FE) MG tablet Take 325 mg by mouth 2 (two) times daily with a meal.   free water Soln Place 200 mLs into feeding tube every 4 (four) hours.   furosemide 40 MG tablet Commonly known as:  LASIX Take 40 mg by mouth 2 (two) times daily. Every 8 hr for swelling prn What changed:  Another medication with the same name was removed. Continue taking this medication, and follow the directions you see here.   gabapentin 100 MG capsule Commonly known as:  NEURONTIN Take 100 mg by mouth 3 (three) times daily.   guaifenesin 100 MG/5ML syrup Commonly known as:  ROBITUSSIN Take 200 mg by mouth as needed for cough. Take 20ml q6h as needed. What changed:  Another medication with the same name was removed. Continue taking this medication, and follow the directions you see here.   ipratropium-albuterol 0.5-2.5 (3) MG/3ML Soln Commonly known as:  DUONEB Take 3 mLs by nebulization every 6 (six) hours as needed (SHORTNESS OF BREATH).   nitroGLYCERIN 0.4 MG SL tablet Commonly known as:  NITROSTAT Place 0.4 mg under the  tongue every 5 (five) minutes as needed for chest pain.   ondansetron 4 MG tablet Commonly known as:  ZOFRAN Take 4 mg by mouth every 8 (eight) hours as needed for nausea or vomiting.   oxyCODONE 5 MG immediate release tablet Commonly known as:  Oxy IR/ROXICODONE Place 1 tablet (5 mg total) into feeding tube every 8 (eight) hours. What changed:  how to take this   phosphorus [id number] MG tablet Commonly known as:  K PHOS NEUTRAL Place 2 tablets (500 mg total) into feeding tube 2 (two) times daily.   polyethylene glycol packet Commonly known as:  MIRALAX / GLYCOLAX Take 17 g by mouth 2 (two) times daily.   QUEtiapine 25 MG tablet Commonly known as:  SEROQUEL Take 50 mg by mouth at bedtime.   senna-docusate 8.6-50 MG tablet Commonly known as:  Senokot-S Take 2 tablets by mouth 2 (two) times daily.   sertraline 25 MG tablet Commonly known as:  ZOLOFT Take 100 mg by mouth daily.   SYSTANE BALANCE 0.6 % Soln Generic drug:  Propylene Glycol Apply 2 drops to eye at bedtime as needed (DRY EYES).   vitamin B-12 100 MCG tablet Commonly known as:  CYANOCOBALAMIN Take 100 mcg by mouth daily.   vitamin C 500 MG tablet Commonly known as:  ASCORBIC ACID Take 500 mg by mouth 2 (two) times daily.   Vitamin D 2000 units tablet Take 2,000 Units by mouth daily.      Contact information for after-discharge care    Destination    [facility] .   Service:  Skilled Nursing Contact information: [place] [phone]             Allergies  Allergen Reactions  . Keppra [Levetiracetam] Other (See Comments)    Increased agitation  . Ciprofloxacin Rash    Consultations:  General surgery     Procedures/Studies: Dg Chest Port 1 View  Result Date: 06/25/2018 CLINICAL DATA:  Altered mental status EXAM: PORTABLE CHEST 1 VIEW COMPARISON:  03/12/2015 FINDINGS: Low lung volumes. Borderline cardiomegaly with aortic  atherosclerosis. No focal opacity or pleural effusion. No pneumothorax. IMPRESSION: No active disease.  Borderline cardiomegaly Electronically Signed   By: Donavan Foil M.D.   On: 06/25/2018 02:10        Discharge Exam: Vitals:   06/30/18 2123 07/01/18 0559  BP: 135/67 (!) 128/45  Pulse: 81 87  Resp:    Temp: 98.7 F (37.1 C) 99.7 F (37.6 C)  SpO2: 98% 99%   Vitals:   06/30/18 1414 06/30/18 1808 06/30/18 2123 07/01/18 0559  BP: 103/75 129/68 135/67 (!) 128/45  Pulse: 78 81 81 87  Resp: 18     Temp: 99.7 F (37.6 C) 99.3 F (37.4 C) 98.7 F (37.1 C) 99.7 F (37.6 C)  TempSrc: Axillary Oral Oral Oral  SpO2: 97% 98% 98% 99%  Weight:      Height:        General: Pt is alert, awake, not in acute distress Cardiovascular: RRR, S1/S2 +, no rubs, no gallops Respiratory: CTA bilaterally, no wheezing, no rhonchi Abdominal: Soft, NT, ND, bowel sounds + Extremities: no edema, no cyanosis   The results of significant diagnostics from this hospitalization (including imaging, microbiology, ancillary and laboratory) are listed below for reference.    Significant Diagnostic Studies: Dg Chest Port 1 View  Result Date: 06/25/2018 CLINICAL DATA:  Altered mental status EXAM: PORTABLE CHEST 1 VIEW COMPARISON:  03/12/2015 FINDINGS: Low lung volumes. Borderline cardiomegaly with aortic atherosclerosis. No focal opacity or pleural effusion. No pneumothorax. IMPRESSION: No active disease.  Borderline cardiomegaly Electronically Signed   By: Donavan Foil M.D.   On: 06/25/2018 02:10     Microbiology: Recent Results (from the past 240 hour(s))  Urine culture     Status: Abnormal   Collection Time: 06/25/18  2:33 AM  Result Value Ref Range Status   Specimen Description   Final    URINE, CATHETERIZED Performed at [facility], [address], [place], [place] [address]    Special Requests   Final    Normal Performed at [facility], [address] [place], [place] [address]     Culture >=100,000 COLONIES/mL PROTEUS MIRABILIS (A)  Final   Report Status 06/29/2018 FINAL  Final   Organism ID, Bacteria PROTEUS MIRABILIS (A)  Final      Susceptibility   Proteus mirabilis - MIC*    AMPICILLIN <=2 SENSITIVE Sensitive     CEFAZOLIN <=4 SENSITIVE Sensitive     CEFTRIAXONE <=1 SENSITIVE Sensitive     CIPROFLOXACIN >=4 RESISTANT Resistant     GENTAMICIN <=1 SENSITIVE Sensitive     IMIPENEM 1 SENSITIVE Sensitive     NITROFURANTOIN 128 RESISTANT Resistant     TRIMETH/SULFA <=20 SENSITIVE Sensitive  AMPICILLIN/SULBACTAM <=2 SENSITIVE Sensitive     PIP/TAZO <=4 SENSITIVE Sensitive     * >=100,000 COLONIES/mL PROTEUS MIRABILIS  MRSA PCR Screening     Status: None   Collection Time: 06/25/18  4:33 PM  Result Value Ref Range Status   MRSA by PCR NEGATIVE NEGATIVE Final    Comment:        The GeneXpert MRSA Assay (FDA approved for NASAL specimens only), is one component of a comprehensive MRSA colonization surveillance program. It is not intended to diagnose MRSA infection nor to guide or monitor treatment for MRSA infections. Performed at [facility], [address], [place], [place] [address]   Surgical pcr screen     Status: None   Collection Time: 06/29/18  8:30 AM  Result Value Ref Range Status   MRSA, PCR NEGATIVE NEGATIVE Final   Staphylococcus aureus NEGATIVE NEGATIVE Final    Comment: (NOTE) The Xpert SA Assay (FDA approved for NASAL specimens in patients 52 years of age and older), is one component of a comprehensive surveillance program. It is not intended to diagnose infection nor to guide or monitor treatment. Performed at [facility], [address], [place], [place] [address]      Labs: Basic Metabolic Panel: Recent Labs  Lab 06/27/18 0911 06/28/18 0818 06/29/18 1538 06/30/18 0530 07/01/18 0503  NA 153* 151* 149* 145 146*  K 3.2* 3.0* 3.4* 3.4* 3.2*  CL 123* 122* 121* 117* 117*  CO2 21* 23 22 21* 21*  GLUCOSE 90 116* 104* 118* 121*    BUN 37* 29* 16 14 12   CREATININE 1.36* 1.36* 1.12 1.03 1.03  CALCIUM 8.6* 8.6* 8.3* 8.1* 8.1*  MG  --   --   --  2.3 2.2  PHOS  --   --   --  2.3* 2.5   Liver Function Tests: Recent Labs  Lab 06/25/18 0206 06/25/18 0433  AST 17 14*  ALT 17 17  ALKPHOS 60 57  BILITOT 1.0 1.0  PROT 8.1 7.5  ALBUMIN 3.4* 3.3*   No results for input(s): LIPASE, AMYLASE in the last 168 hours. No results for input(s): AMMONIA in the last 168 hours. CBC: Recent Labs  Lab 06/25/18 0206 06/25/18 0433 06/29/18 1538  WBC 14.3* 15.6* 10.0  NEUTROABS 10.3*  --   --   HGB 12.4* 12.2* 11.3*  HCT 41.1 39.6 35.3*  MCV 100.5* 102.6* 98.9  PLT 355 331 321   Cardiac Enzymes: No results for input(s): CKTOTAL, CKMB, CKMBINDEX, TROPONINI in the last 168 hours. BNP: Invalid input(s): POCBNP CBG: No results for input(s): GLUCAP in the last 168 hours.  Time coordinating discharge:  36 minutes  Signed:  Orson Eva, DO [facility] Pager: [phone] 07/01/2018, 11:19 AM

## 2018-06-30 NOTE — Clinical Social Work Note (Signed)
[name] at [facility] to start insurance authorization today. Advised that he now has a PEG tube.     [name], [name], LCSW

## 2018-06-30 NOTE — Progress Notes (Signed)
1 Day Post-Op  Subjective: No apparent pain.  Objective: Vital signs in last 24 hours: Temp:  [98.2 F (36.8 C)-99.3 F (37.4 C)] 99.3 F (37.4 C) (10/24 0558) Pulse Rate:  [62-72] 71 (10/24 0558) Resp:  [0-19] 19 (10/24 0558) BP: (103-150)/(58-75) 103/64 (10/24 0558) SpO2:  [88 %-100 %] 100 % (10/24 0558) Weight:  [116.6 kg-124.7 kg] 116.6 kg (10/23 1344) Last BM Date: 06/29/18  Intake/Output from previous day: 10/23 0701 - 10/24 0700 In: 2597.6 [I.V.:2097.9; NG/GT:14; IV Piggyback:155.7] Out: 0  Intake/Output this shift: No intake/output data recorded.  General appearance: alert, cooperative and no distress GI: Soft, nontender.  PEG site normal.  Lab Results:  Recent Labs    06/29/18 1538  WBC 10.0  HGB 11.3*  HCT 35.3*  PLT 321   BMET Recent Labs    06/29/18 1538 06/30/18 0530  NA 149* 145  K 3.4* 3.4*  CL 121* 117*  CO2 22 21*  GLUCOSE 104* 118*  BUN 16 14  CREATININE 1.12 1.03  CALCIUM 8.3* 8.1*   PT/INR No results for input(s): LABPROT, INR in the last 72 hours.  Studies/Results: No results found.  Anti-infectives: Anti-infectives (From admission, onward)   Start     Dose/Rate Route Frequency Ordered Stop   06/27/18 1030  cefTRIAXone (ROCEPHIN) 1 g in sodium chloride 0.9 % 100 mL IVPB     1 g 200 mL/hr over 30 Minutes Intravenous Every 24 hours 06/27/18 1016     06/25/18 1400  aztreonam (AZACTAM) 1 g in sodium chloride 0.9 % 100 mL IVPB  Status:  Discontinued     1 g 200 mL/hr over 30 Minutes Intravenous Every 8 hours 06/25/18 1107 06/27/18 1016   06/25/18 0315  aztreonam (AZACTAM) 2 g in sodium chloride 0.9 % 100 mL IVPB     2 g 200 mL/hr over 30 Minutes Intravenous  Once 06/25/18 0306 06/25/18 0404      Assessment/Plan: s/p Procedure(s): PERCUTANEOUS ENDOSCOPIC GASTROSTOMY (PEG) PLACEMENT ESOPHAGOGASTRODUODENOSCOPY (EGD) WITH PROPOFOL Impression: Stable status post PEG.  Seems to be tolerating tube feeds well.  Will sign off.  LOS: 5  days    Aviva Signs 06/30/2018

## 2018-06-30 NOTE — Addendum Note (Signed)
Addendum  created 06/30/18 1431 by Vista Deck, CRNA   Intraprocedure Staff edited

## 2018-07-01 ENCOUNTER — Encounter (HOSPITAL_COMMUNITY): Payer: Self-pay | Admitting: General Surgery

## 2018-07-01 LAB — BASIC METABOLIC PANEL
Anion gap: 8 (ref 5–15)
BUN: 12 mg/dL (ref 8–23)
CALCIUM: 8.1 mg/dL — AB (ref 8.9–10.3)
CO2: 21 mmol/L — AB (ref 22–32)
CREATININE: 1.03 mg/dL (ref 0.61–1.24)
Chloride: 117 mmol/L — ABNORMAL HIGH (ref 98–111)
GFR calc Af Amer: >60 mL/min (ref >60)
GFR calc non Af Amer: >60 mL/min (ref >60)
GLUCOSE: 121 mg/dL — AB (ref 70–99)
Potassium: 3.2 mmol/L — ABNORMAL LOW (ref 3.5–5.1)
Sodium: 146 mmol/L — ABNORMAL HIGH (ref 135–145)

## 2018-07-01 LAB — PHOSPHORUS: Phosphorus: 2.5 mg/dL (ref 2.5–4.6)

## 2018-07-01 LAB — MAGNESIUM: Magnesium: 2.2 mg/dL (ref 1.7–2.4)

## 2018-07-01 MED ORDER — K PHOS MONO-SOD PHOS DI & MONO 155-852-130 MG PO TABS
500.0000 mg | ORAL_TABLET | Freq: Two times a day (BID) | ORAL | Status: DC
  Administered 2018-07-01: 500 mg
  Filled 2018-07-01: qty 2

## 2018-07-01 MED ORDER — DIVALPROEX SODIUM 125 MG PO CSDR: 500.0000 mg | DELAYED_RELEASE_CAPSULE | Freq: Two times a day (BID) | ORAL | 0 refills | Status: DC

## 2018-07-01 MED ORDER — OXYCODONE HCL 5 MG PO TABS: 5.0000 mg | ORAL_TABLET | Freq: Three times a day (TID) | ORAL | 0 refills | Status: DC

## 2018-07-01 MED ORDER — DIVALPROEX SODIUM 125 MG PO CSDR
500.0000 mg | DELAYED_RELEASE_CAPSULE | Freq: Two times a day (BID) | ORAL | Status: DC
  Filled 2018-07-01 (×6): qty 4

## 2018-07-01 MED ORDER — K PHOS MONO-SOD PHOS DI & MONO 155-852-130 MG PO TABS: 500.0000 mg | ORAL_TABLET | Freq: Two times a day (BID) | ORAL | 0 refills | Status: DC

## 2018-07-01 MED ORDER — CEFIXIME 100 MG/5ML PO SUSR: 200.0000 mg | Freq: Two times a day (BID) | ORAL | 0 refills | Status: DC

## 2018-07-01 NOTE — Care Management Important Message (Signed)
Important Message  Patient Details  Name: Mark [name] MRN: [id number] Date of Birth: February 26, 1946   Medicare Important Message Given:  Yes    [name] 07/01/2018, 10:13 AM

## 2018-07-01 NOTE — Progress Notes (Signed)
IV and foley removed.  Report called to April, staff nurse at [place], [place].

## 2018-07-01 NOTE — Discharge Instructions (Signed)
To improve patients QOL at facility, could consider trial of cycled feeds:  Osmolite 1.5 @ 60 x24hrs ->>> @ 90cc/hr x 16 hrs +Prostat 30 ml Q 24 hrs and 200 cc free water q 4 If loose stools worsen, would consider antidiarrheal as indicated.

## 2018-07-01 NOTE — NC FL2 (Signed)
[place] LEVEL OF CARE SCREENING TOOL     IDENTIFICATION  Patient Name: Mark [name] Birthdate: 01-Oct-1945 Sex: male Admission Date (Current Location): 06/25/2018  [facility] and [place] Number:  [employer] and Address:  [facility] [address] [place]      Provider Number: [id number]  Attending Physician Name and Address:  Orson Eva, MD  Relative Name and Phone Number:       Current Level of Care: Hospital Recommended Level of Care: [place] Prior Approval Number:    Date Approved/Denied:   PASRR Number:    Discharge Plan: SNF    Current Diagnoses: Patient Active Problem List   Diagnosis Date Noted  . Acute renal failure superimposed on stage 3 chronic kidney disease ([place]) 06/29/2018  . Acute lower UTI 06/29/2018  . Parkinson's disease ([place])   . Advanced care planning/counseling discussion   . Goals of care, counseling/discussion   . Palliative care by specialist   . Other dysphagia   . Hypernatremia 06/25/2018  . Gait abnormality 05/26/2018  . Left arm swelling 06/15/2016  . 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 Psychosis ([place]) 01/24/2016  . Hallucinations   . Chest pain 08/22/2014  . Coronary artery disease involving native coronary artery of native heart with other form of angina pectoris ([place])   . Essential hypertension   . Hyperlipidemia   . Paroxysmal atrial fibrillation ([facility])   . Colon cancer screening 01/03/2014  . Elevated LFTs 10/05/2013  . Liver lesion 06/23/2012  . Pyelonephritis, acute 06/23/2012  . Hypotension 06/21/2012  . Bacteremia due to Escherichia coli 06/21/2012  . Sepsis ([place]) 06/20/2012  . FTT (failure to thrive) in adult 06/19/2012  . ARF (acute renal failure) ([place]) 06/19/2012  . Urinary tract infection with hematuria 06/19/2012  . Dehydration 06/19/2012  . Frequent falls 06/19/2012  . Hypokalemia 06/19/2012  . Anemia 06/19/2012  . Seizure disorder ([place])  06/19/2012  . Fracture of right olecranon process 10/23/2011  . Olecranon fracture, recurrent 10/19/2011  . Fall 08/13/2011  . CAD (coronary artery disease) 07/14/2011  . HTN (hypertension) 07/14/2011  . Parkinson disease ([place]) 07/14/2011    Orientation RESPIRATION BLADDER Height & Weight     Self  Normal Continent Weight: 257 lb (116.6 kg)(bed weight) Height:  6\' 2"  (188 cm)  BEHAVIORAL SYMPTOMS/MOOD NEUROLOGICAL BOWEL NUTRITION STATUS      Continent Feeding tube(PEG tube)  AMBULATORY STATUS COMMUNICATION OF NEEDS Skin   Total Care Verbally Normal                       Personal Care Assistance Level of Assistance  Bathing, Feeding, Dressing Bathing Assistance: Maximum assistance Feeding assistance: Limited assistance Dressing Assistance: Maximum assistance     Functional Limitations Info  Sight, Hearing, Speech Sight Info: Adequate Hearing Info: Adequate Speech Info: Adequate    SPECIAL CARE FACTORS FREQUENCY  Blood pressure                    Contractures Contractures Info: Not present    Additional Factors Info  Code Status, Allergies, Psychotropic Code Status Info: DNR Allergies Info: Keppra, Ciprofloxacin Psychotropic Info: seroquel, zoloft         Current Medications (07/01/2018):  This is the current hospital active medication list Current Facility-Administered Medications  Medication Dose Route Frequency Provider Last Rate Last Dose  . acetaminophen (TYLENOL) tablet 650 mg  650 mg Oral Q6H PRN Aviva Signs, MD  Or  . acetaminophen (TYLENOL) suppository 650 mg  650 mg Rectal Q6H PRN Aviva Signs, MD   650 mg at 06/26/18 1507  . apixaban (ELIQUIS) tablet 5 mg  5 mg Per Tube BID Orson Eva, MD   5 mg at 07/01/18 0813  . bisacodyl (DULCOLAX) suppository 10 mg  10 mg Rectal PRN Aviva Signs, MD      . carbidopa-levodopa (SINEMET IR) 25-100 MG per tablet immediate release 1 tablet  1 tablet Per Tube TID Schorr, Rhetta Mura, NP   1 tablet  at 07/01/18 [phone]  . carvedilol (COREG) tablet 3.125 mg  3.125 mg Per Tube BID WC Tat, Shanon Brow, MD   3.125 mg at 07/01/18 0813  . cefixime (SUPRAX) 100 MG/5ML suspension 200 mg  200 mg Per Tube Therisa Doyne, MD   200 mg at 07/01/18 0814  . chlorhexidine (PERIDEX) 0.12 % solution 15 mL  15 mL Mouth Rinse BID Aviva Signs, MD   15 mL at 07/01/18 0815  . cloNIDine (CATAPRES) tablet 0.1 mg  0.1 mg Oral PRN Aviva Signs, MD      . donepezil (ARICEPT) tablet 5 mg  5 mg Per Tube QHS Schorr, Rhetta Mura, NP   5 mg at 06/30/18 2235  . DULoxetine (CYMBALTA) DR capsule 20 mg  20 mg Oral BID Aviva Signs, MD   20 mg at 07/01/18 0813  . famotidine (PEPCID) 40 MG/5ML suspension 20 mg  20 mg Per Tube Daily Tat, David, MD      . feeding supplement (OSMOLITE 1.5 CAL) liquid 1,000 mL  1,000 mL Per Tube Continuous Tat, David, MD 60 mL/hr at 06/30/18 1023 1,000 mL at 06/30/18 1023  . feeding supplement (PRO-STAT SUGAR FREE 64) liquid 30 mL  30 mL Per Tube Daily Aviva Signs, MD   30 mL at 07/01/18 0814  . free water 200 mL  200 mL Per Tube Q4H Aviva Signs, MD   200 mL at 07/01/18 0430  . gabapentin (NEURONTIN) 250 MG/5ML solution 100 mg  100 mg Per Tube Q8H Schorr, Rhetta Mura, NP   100 mg at 07/01/18 0540  . ipratropium-albuterol (DUONEB) 0.5-2.5 (3) MG/3ML nebulizer solution 3 mL  3 mL Nebulization Q6H PRN Aviva Signs, MD      . MEDLINE mouth rinse  15 mL Mouth Rinse q12n4p Aviva Signs, MD   15 mL at 06/30/18 1608  . morphine 2 MG/ML injection 0.5-1 mg  0.5-1 mg Intravenous Q6H PRN Aviva Signs, MD   0.5 mg at 06/28/18 1617  . oxymetazoline (AFRIN) 0.05 % nasal spray 2 spray  2 spray Each Nare PRN Aviva Signs, MD      . QUEtiapine (SEROQUEL) tablet 50 mg  50 mg Per Tube QHS Schorr, Rhetta Mura, NP   50 mg at 06/30/18 2235  . sertraline (ZOLOFT) tablet 100 mg  100 mg Oral Daily Aviva Signs, MD   100 mg at 07/01/18 0813  . valproate (DEPACON) 500 mg in dextrose 5 % 50 mL IVPB  500 mg Intravenous Q12H  Aviva Signs, MD 55 mL/hr at 07/01/18 0813 500 mg at 07/01/18 0813     Discharge Medications: Please see discharge summary for a list of discharge medications.  Relevant Imaging Results:  Relevant Lab Results:   Additional Information SSN # [id number]  Ihor Gully, LCSW

## 2018-07-01 NOTE — Progress Notes (Signed)
Brief follow up note.   Following up to see how patient did at goal rate of 60 cc/hr.   Pt reportedly had some loose stools overnight, though wife downplays this and would not classify them as runny or as diarrhea. Patient denies nausea. Abdomen nondistended.   Plan is to discharge patient patient on current regimen. To improve his QOL, he can be cycled at facility as tolerates. He may need a bowel stopper if cycled further, such as imodium  Will place in D/C notes  Burtis Junes RD, LDN, CNSC Clinical Nutrition Available Tues-Sat via Pager: [phone] 07/01/2018 11:39 AM

## 2018-11-13 ENCOUNTER — Other Ambulatory Visit: Payer: Self-pay

## 2018-11-13 ENCOUNTER — Encounter (HOSPITAL_COMMUNITY): Payer: Self-pay | Admitting: Emergency Medicine

## 2018-11-13 ENCOUNTER — Emergency Department (HOSPITAL_COMMUNITY)
Admission: EM | Admit: 2018-11-13 | Discharge: 2018-11-13 | Disposition: A | Discharge status: Skilled Nursing Facility | Payer: Medicare HMO | Attending: Emergency Medicine | Admitting: Emergency Medicine

## 2018-11-13 ENCOUNTER — Emergency Department (HOSPITAL_COMMUNITY): Payer: Medicare HMO

## 2018-11-13 DIAGNOSIS — Y69 Unspecified misadventure during surgical and medical care: Secondary | ICD-10-CM | POA: Diagnosis not present

## 2018-11-13 DIAGNOSIS — I2510 Atherosclerotic heart disease of native coronary artery without angina pectoris: Secondary | ICD-10-CM | POA: Diagnosis not present

## 2018-11-13 DIAGNOSIS — T85598A Other mechanical complication of other gastrointestinal prosthetic devices, implants and grafts, initial encounter: Secondary | ICD-10-CM | POA: Diagnosis not present

## 2018-11-13 DIAGNOSIS — I10 Essential (primary) hypertension: Secondary | ICD-10-CM | POA: Insufficient documentation

## 2018-11-13 DIAGNOSIS — Z85828 Personal history of other malignant neoplasm of skin: Secondary | ICD-10-CM | POA: Diagnosis not present

## 2018-11-13 DIAGNOSIS — I252 Old myocardial infarction: Secondary | ICD-10-CM | POA: Diagnosis not present

## 2018-11-13 DIAGNOSIS — G20 Parkinson's disease: Secondary | ICD-10-CM | POA: Insufficient documentation

## 2018-11-13 DIAGNOSIS — R1013 Epigastric pain: Secondary | ICD-10-CM | POA: Insufficient documentation

## 2018-11-13 DIAGNOSIS — K920 Hematemesis: Secondary | ICD-10-CM | POA: Diagnosis present

## 2018-11-13 LAB — COMPREHENSIVE METABOLIC PANEL
ALBUMIN: 3.4 g/dL — AB (ref 3.5–5.0)
ALT: 8 U/L (ref 0–44)
ANION GAP: 10 (ref 5–15)
AST: 20 U/L (ref 15–41)
Alkaline Phosphatase: 81 U/L (ref 38–126)
BILIRUBIN TOTAL: 0.5 mg/dL (ref 0.3–1.2)
BUN: 21 mg/dL (ref 8–23)
CHLORIDE: 102 mmol/L (ref 98–111)
CO2: 29 mmol/L (ref 22–32)
Calcium: 8.6 mg/dL — ABNORMAL LOW (ref 8.9–10.3)
Creatinine, Ser: 1.54 mg/dL — ABNORMAL HIGH (ref 0.61–1.24)
GFR calc Af Amer: 51 mL/min — ABNORMAL LOW (ref >60)
GFR calc non Af Amer: 44 mL/min — ABNORMAL LOW (ref >60)
GLUCOSE: 153 mg/dL — AB (ref 70–99)
POTASSIUM: 3.8 mmol/L (ref 3.5–5.1)
SODIUM: 141 mmol/L (ref 135–145)
TOTAL PROTEIN: 7.5 g/dL (ref 6.5–8.1)

## 2018-11-13 LAB — CBC
HCT: 38.5 % — ABNORMAL LOW (ref 39.0–52.0)
HEMOGLOBIN: 11.9 g/dL — AB (ref 13.0–17.0)
MCH: 29.5 pg (ref 26.0–34.0)
MCHC: 30.9 g/dL (ref 30.0–36.0)
MCV: 95.5 fL (ref 80.0–100.0)
PLATELETS: 275 10*3/uL (ref 150–400)
RBC: 4.03 MIL/uL — AB (ref 4.22–5.81)
RDW: 14 % (ref 11.5–15.5)
WBC: 12.2 10*3/uL — ABNORMAL HIGH (ref 4.0–10.5)
nRBC: 0 % (ref 0.0–0.2)

## 2018-11-13 LAB — BRAIN NATRIURETIC PEPTIDE: B Natriuretic Peptide: 93 pg/mL (ref 0.0–100.0)

## 2018-11-13 LAB — TYPE AND SCREEN
ABO/RH(D): O POS
ANTIBODY SCREEN: NEGATIVE

## 2018-11-13 LAB — TROPONIN I: Troponin I: <0.03 ng/mL (ref <0.03)

## 2018-11-13 LAB — LIPASE, BLOOD: Lipase: 30 U/L (ref 11–51)

## 2018-11-13 LAB — PROTIME-INR
INR: 1.5 — ABNORMAL HIGH (ref 0.8–1.2)
PROTHROMBIN TIME: 17.7 s — AB (ref 11.4–15.2)

## 2018-11-13 LAB — APTT: APTT: 43 s — AB (ref 24–36)

## 2018-11-13 LAB — LACTIC ACID, PLASMA: Lactic Acid, Venous: 1.7 mmol/L (ref 0.5–1.9)

## 2018-11-13 MED ORDER — ACETAMINOPHEN 500 MG PO TABS
1000.0000 mg | ORAL_TABLET | Freq: Once | ORAL | Status: AC
  Administered 2018-11-13: 1000 mg via ORAL
  Filled 2018-11-13: qty 2

## 2018-11-13 MED ORDER — IPRATROPIUM-ALBUTEROL 0.5-2.5 (3) MG/3ML IN SOLN
3.0000 mL | Freq: Once | RESPIRATORY_TRACT | Status: AC
  Administered 2018-11-13: 3 mL via RESPIRATORY_TRACT
  Filled 2018-11-13: qty 3

## 2018-11-13 NOTE — ED Triage Notes (Signed)
From [place].  Pt coughing up blood tinged sputum since last night.  Pt started coughing this morning and dark colored blood looking substance was coming out of g tube.

## 2018-11-13 NOTE — ED Notes (Signed)
Flushed g tube with 100 cc of water without difficultly

## 2018-11-13 NOTE — ED Provider Notes (Signed)
[facility] Emergency Department Provider Note MRN:  [id number]  Arrival date & time: 11/13/18     Chief Complaint   Hematemesis History of Present Illness   Mark [name] is a 73 y.o. year-old male with a history of Parkinson's disease, coronary artery disease, MI presenting to the ED with chief complaint of hematemesis.  According to care facility as well as patient's wife, patient has had 2 episodes where he sneezes and this causes a dark substance to come out of his feeding tube.  Facility concerned that this substance is bloody in nature, coming from his stomach.  Patient denies chest pain, denies coughing up blood, denies headache or vision change, no fever.  Endorsing mild epigastric discomfort, constant.  Denies anticoagulation.  Wife endorsing that he said increased shortness of breath for the past few days, increased lower extremity edema for the past few days.  Denies blood in stool.  Review of Systems  A complete 10 system review of systems was obtained and all systems are negative except as noted in the HPI and PMH.   Patient's Health History    Past Medical History:  Diagnosis Date  . Arthritis   . Bacteremia due to Escherichia coli 06/21/2012  . Cervical vertebral fracture ([place])   . Coronary artery disease    SEVERE 3-VESSEL DX WITH NORMAL EF  . Fall   . Fracture of right olecranon process 10/23/2011  . Gout   . Hypertension   . Kidney stones   . MI (myocardial infarction) ([place]) 08/2011   /H&P (05/12/2012)  . Non-ST elevated myocardial infarction (non-STEMI) ([place])   . PAF (paroxysmal atrial fibrillation) ([place])   . Parkinson's disease   . 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 Adenoma.  . Pyelonephritis, acute 06/23/2012  . Seizures ([place]) 12/10/2011   HAD SEIZURE  . Skin cancer    left shoulder  . Sleep apnea     Past Surgical History:  Procedure Laterality Date  . CARDIAC CATHETERIZATION  06/26/2011   NORMAL LV SYSTOLIC FUNCTION. RCA IS LARGE AND  DOMINANT  . CORONARY ANGIOPLASTY WITH STENT PLACEMENT  ~ 2000   "one that I know of"  . ESOPHAGOGASTRODUODENOSCOPY (EGD) WITH PROPOFOL N/A 06/29/2018   Procedure: ESOPHAGOGASTRODUODENOSCOPY (EGD) WITH PROPOFOL;  Surgeon: Aviva Signs, MD;  [facility];  Service: Gastroenterology;  Laterality: N/A;  . HARDWARE REMOVAL  12/29/2011   Procedure: HARDWARE REMOVAL;  Surgeon: Johnny Bridge, MD;  Location: [place];  Service: Orthopedics;  Laterality: Right;  . ORIF ELBOW FRACTURE  12/29/2011   Procedure: OPEN REDUCTION INTERNAL FIXATION (ORIF) ELBOW/OLECRANON FRACTURE;  Surgeon: Johnny Bridge, MD;  Location: [place];  Service: Orthopedics;  Laterality: Right;  . PAROTIDECTOMY  05/12/2012   right  . PAROTIDECTOMY  05/12/2012   Procedure: PAROTIDECTOMY;  Surgeon: Rozetta Nunnery, MD;  Location: [place];  Service: ENT;  Laterality: Right;  PAROTIDECTOMY MASS  . PEG PLACEMENT N/A 06/29/2018   Procedure: PERCUTANEOUS ENDOSCOPIC GASTROSTOMY (PEG) PLACEMENT;  Surgeon: Aviva Signs, MD;  [facility];  Service: Gastroenterology;  Laterality: N/A;  . SKIN CANCER EXCISION  2013   left shoulder  . SKIN CANCER EXCISION     right ear  . TRANSTHORACIC ECHOCARDIOGRAM  06/26/2011   EF 55-60%    Family History  Problem Relation Age of Onset  . Heart attack Father   . Heart disease Other   . Arthritis Other   . Colon cancer Neg Hx   . Liver  disease Neg Hx     Social History   Socioeconomic History  . Marital status: Married    Spouse name: Not on file  . Number of children: 1  . Years of education: Bachelors  . Highest education level: Not on file  Occupational History  . Occupation: Disabled  Social Needs  . Financial resource strain: Not on file  . Food insecurity:    Worry: Not on file    Inability: Not on file  . Transportation needs:    Medical: Not on file    Non-medical: Not on file  Tobacco Use  . Smoking status: Never Smoker  . Smokeless tobacco: Never Used    Substance and Sexual Activity  . Alcohol use: No    Alcohol/week: 0.0 standard drinks  . Drug use: No  . Sexual activity: Yes  Lifestyle  . Physical activity:    Days per week: Not on file    Minutes per session: Not on file  . Stress: Not on file  Relationships  . Social connections:    Talks on phone: Not on file    Gets together: Not on file    Attends religious service: Not on file    Active member of club or organization: Not on file    Attends meetings of clubs or organizations: Not on file    Relationship status: Not on file  . Intimate partner violence:    Fear of current or ex partner: Not on file    Emotionally abused: Not on file    Physically abused: Not on file    Forced sexual activity: Not on file  Other Topics Concern  . Not on file  Social History Narrative   Lives at [place] home in [place].   Right-handed.   No caffeine use.     Physical Exam  Vital Signs and Nursing Notes reviewed Vitals:   11/13/18 1115 11/13/18 1130  BP:  (!) 154/87  Pulse: 88 85  Resp:    Temp:    SpO2: 94% 92%    CONSTITUTIONAL: Chronically ill-appearing, NAD NEURO:  Alert and oriented x 3, no focal deficits EYES:  eyes equal and reactive ENT/NECK:  no LAD, no JVD CARDIO: Regular rate, well-perfused, normal S1 and S2 PULM:  CTAB no wheezing or rhonchi; decreased breath sounds bilateral bases GI/GU:  normal bowel sounds, non-distended, minimal epigastric tenderness palpation, feeding tube in place MSK/SPINE:  No gross deformities, 2+ pitting edema bilateral lower extremities SKIN:  no rash, atraumatic PSYCH:  Appropriate speech and behavior  Diagnostic and Interventional Summary    EKG Interpretation  Date/Time:  Sunday November 13 2018 10:11:04 EDT Ventricular Rate:  89 PR Interval:    QRS Duration: 107 QT Interval:  383 QTC Calculation: 466 R Axis:   -2 Text Interpretation:  Sinus rhythm Low voltage, precordial leads Confirmed by Gerlene Fee [phone]) on  11/13/2018 10:49:12 AM      Labs Reviewed  CBC - Abnormal; Notable for the following components:      Result Value   WBC 12.2 (*)    RBC 4.03 (*)    Hemoglobin 11.9 (*)    HCT 38.5 (*)    All other components within normal limits  COMPREHENSIVE METABOLIC PANEL - Abnormal; Notable for the following components:   Glucose, Bld 153 (*)    Creatinine, Ser 1.54 (*)    Calcium 8.6 (*)    Albumin 3.4 (*)    GFR calc non Af Amer 44 (*)  GFR calc Af Amer 51 (*)    All other components within normal limits  PROTIME-INR - Abnormal; Notable for the following components:   Prothrombin Time 17.7 (*)    INR 1.5 (*)    All other components within normal limits  APTT - Abnormal; Notable for the following components:   aPTT 43 (*)    All other components within normal limits  LIPASE, BLOOD  LACTIC ACID, PLASMA  BRAIN NATRIURETIC PEPTIDE  TROPONIN I  TYPE AND SCREEN    DG ABD ACUTE 2+V W 1V CHEST  Final Result      Medications  ipratropium-albuterol (DUONEB) 0.5-2.5 (3) MG/3ML nebulizer solution 3 mL (3 mLs Nebulization Given 11/13/18 1055)     Procedures Critical Care  ED Course and Medical Decision Making  I have reviewed the triage vital signs and the nursing notes.  Pertinent labs & imaging results that were available during my care of the patient were reviewed by me and considered in my medical decision making (see below for details).  Question of hematemesis according to care facility, however the substance could just as easily be gastric contents, and does not appear to be obvious blood or coffee-ground's.  Vital signs are normal.  Of slightly greater concern would be patient's slightly increased work of breathing as well as lower extremity edema, considering CHF exacerbation, metabolic disarray, work-up pending.  Labs are largely reassuring, very mild AKI best explained by the fact that the nursing staff at his care facility stopped providing him with overnight feeds for the past  2 nights due to the substance coming out of the tube when he sneezes.  Patient relies on these overnight feeds for hydration according to wife.  H&H is improved from prior, no evidence of hematemesis here in the ED.  Much more likely to be gastric contents.  Labs and x-ray also did not reveal signs of CHF.  It is possible that the feeding tube has a one-way valve that is malfunctioning.  We did some troubleshooting of the tube today, easily injected with normal saline, clamped, asked patient to cough with no ejection of contents.  Strict return precautions for any other episodes of possible bleeding, advised to follow-up with the doctor the normally manages this tube for evaluation.  After the discussed management above, the patient was determined to be safe for discharge.  The patient was in agreement with this plan and all questions regarding their care were answered.  ED return precautions were discussed and the patient will return to the ED with any significant worsening of condition.  Barth Kirks. Sedonia Small, [place] [email or web address]  Final Clinical Impressions(s) / ED Diagnoses     ICD-10-CM   1. Feeding tube dysfunction, initial encounter T85.598A   2. Epigastric pain R10.13 DG ABD ACUTE 2+V W 1V CHEST    DG ABD ACUTE 2+V W 1V CHEST    ED Discharge Orders    None         Maudie Flakes, MD 11/13/18 1231

## 2018-11-13 NOTE — Discharge Instructions (Addendum)
You were evaluated in the Emergency Department and after careful evaluation, we did not find any emergent condition requiring admission or further testing in the hospital.  Your blood testing today was reassuring with no signs of serious bleeding.  We tested your feeding tube and it is functional, please restart your normal feeding.  It is possible that there is an issue with the internal valve, please discuss this with the doctor that regularly manages the tube.  Please return to the Emergency Department if you experience any worsening of your condition.  We encourage you to follow up with a primary care provider.  Thank you for allowing [place] to be a part of your care.

## 2019-01-24 ENCOUNTER — Ambulatory Visit: Payer: Medicare HMO | Admitting: Neurology

## 2019-03-02 ENCOUNTER — Ambulatory Visit: Payer: Medicare HMO | Admitting: Neurology

## 2019-03-20 IMAGING — CR DG CHEST 1V PORT
1 series · 1 of 1 positions shown · non-contrast
Comparison: 03/12/2015

CLINICAL DATA: Altered mental status

EXAM:
PORTABLE CHEST 1 VIEW

[portable]
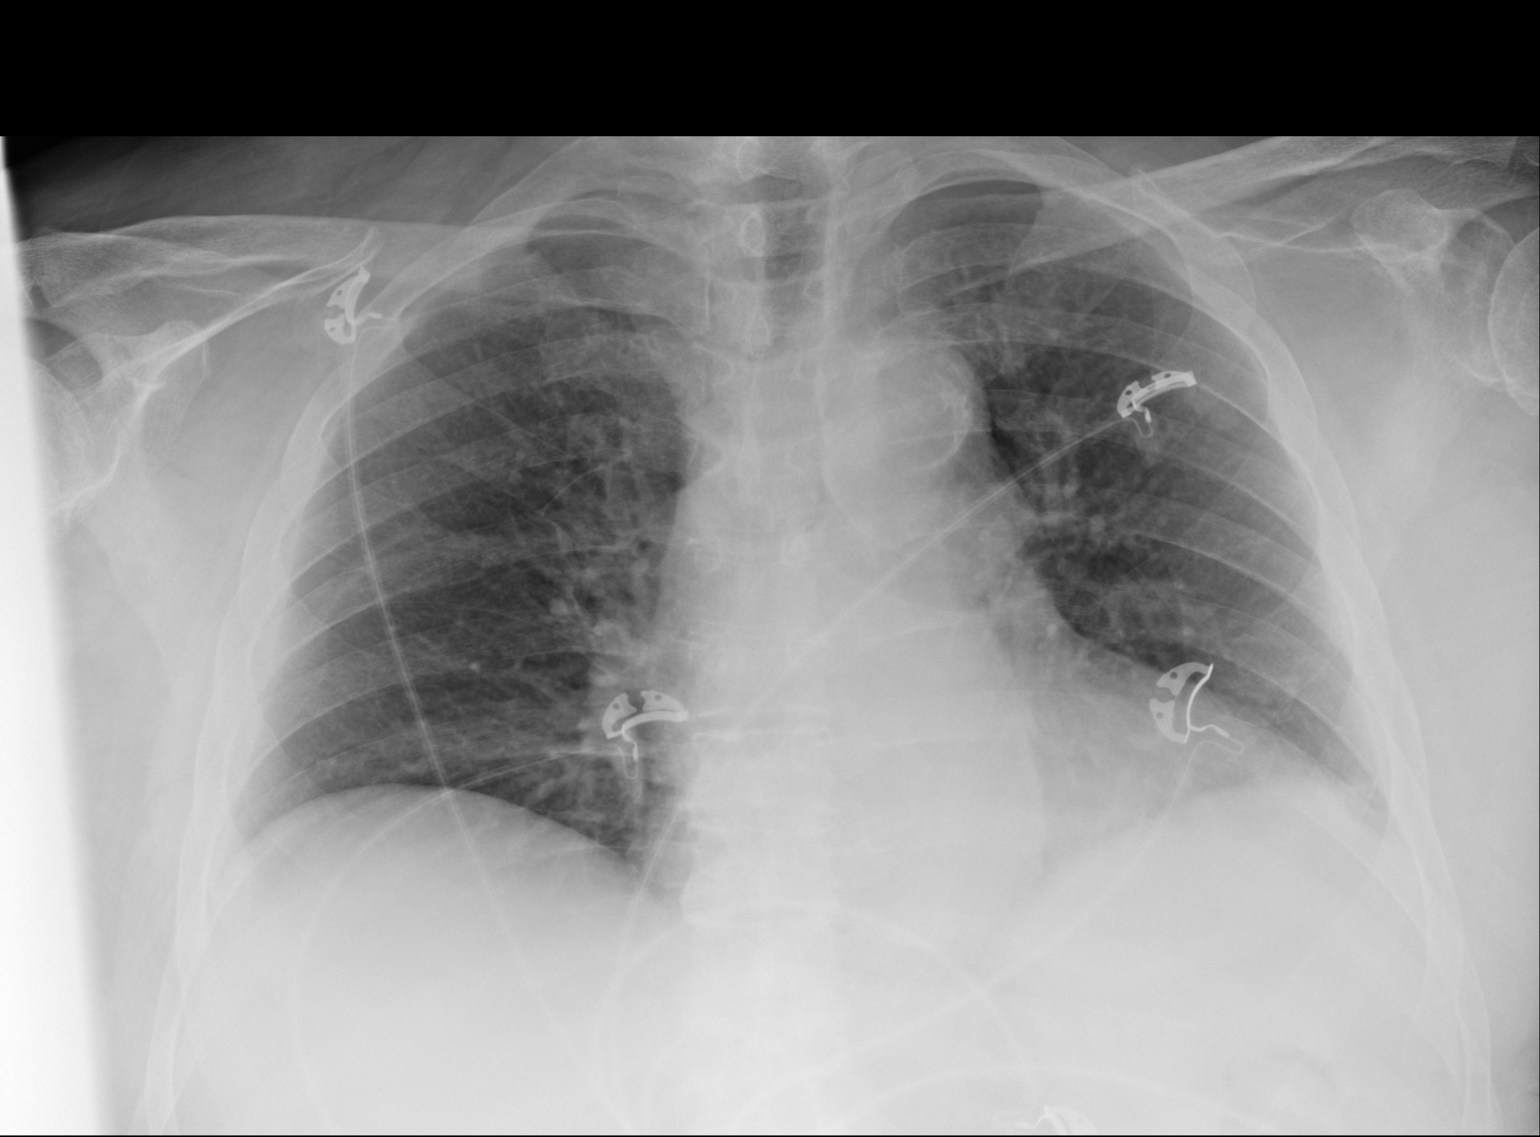

[1 of 1 positions shown; findings below may reference images not displayed]

FINDINGS: Low lung volumes. Borderline cardiomegaly with aortic
atherosclerosis. No focal opacity or pleural effusion. No
pneumothorax.
IMPRESSION: No active disease.  Borderline cardiomegaly

## 2019-04-20 ENCOUNTER — Ambulatory Visit: Payer: Medicare HMO | Admitting: Neurology

## 2019-05-16 ENCOUNTER — Encounter: Payer: Self-pay | Admitting: Neurology

## 2019-05-16 ENCOUNTER — Ambulatory Visit (INDEPENDENT_AMBULATORY_CARE_PROVIDER_SITE_OTHER): Payer: Medicare HMO | Admitting: Neurology

## 2019-05-16 ENCOUNTER — Other Ambulatory Visit: Payer: Self-pay

## 2019-05-16 VITALS — BP 131/79 | HR 78 | Temp 98.6°F

## 2019-05-16 DIAGNOSIS — F0390 Unspecified dementia without behavioral disturbance: Secondary | ICD-10-CM

## 2019-05-16 DIAGNOSIS — G20 Parkinson's disease: Secondary | ICD-10-CM

## 2019-05-16 DIAGNOSIS — G40909 Epilepsy, unspecified, not intractable, without status epilepticus: Secondary | ICD-10-CM

## 2019-05-16 NOTE — Progress Notes (Signed)
[facility]  PATIENT: Mark [name] DOB: [birth year]   HISTORY OF PRESENT ILLNESS: Mark [name] is a 73 years old right-handed male, accompanied by his wife [name], seen in refer by his primary care doctor Hilbert Corrigan for evaluation of Parkinson's disease, initial evaluation was on June 08 2016.  He came in with wheelchair today, he is currently a resident of [facility] since 2013 Reviewed and summarized the referring note, he had a history of atrial fibrillation on chronic anticoagulation Eliquis, seizure, coronary artery disease, status post stent in 2012, hypertension, history of stroke, memory loss, sleep apnea, hyperlipidemia,  He was diagnosed with Parkinson's disease since 2007, initial symptoms was bradykinesia, difficulty to get in and out of shower, later noted shuffling gait, was treated by Dr. Jannifer Franklin before.  Then he was treated by Dr. Janann Colonel over the past few years.  He was treated with sinemet for many years, now taking Sinemet 25/100 2 tabs tid,  for a while he was taking high dose of Sinemet 25/100 mg up to 2 tablets 5 times a day, even at that dosage, he could not walk, could not stand up even with that dose.  He had sepsis from UTI in 2013, was discharged to nursing home since then, at that time he become wheelchair dependent, also totally dependent on his daily activity, shower, bathing, toileting, feeding, brushing his teeth. He remained in that level of care since.  He has frequent UTI, chronic constipation.  He has no incontinence.  He also had worsening memory loss, he is a retired Tax adviser at age 87 years old, he also had chronic low back pain, he still play cards, bingo, he is easily agitated, he sleeps well, he has delusional idea sometimes, very sensitive to sound,  He is on polypharmacy treatment, zoloft '25mg'$  qday, seroquel '25mg'$  2 tabs qhs, aricept '5mg'$  qhs, sinemet 25/100 2 tabs tid.   Overall, he had slowly decline  over the past few years, but no dramatic changes, he needs more help to get in and out of his wheelchair, needing lift to transfer. He has recent dysphagia, was seen by speech pathologist, improved with neck flexion technique  Laboratory evaluation in September 2017, elevated creatinine 1.38, glucose 105, hemoglobin 13 point 5, ESR 18,  He had history of seizure since 2007, he is now taking keppra '250mg'$  bid, last seizure was around 2013.  UPDATE December 10 2016: He has no recurrent seizure, still taking Keppra 250 mg twice a day, no significant change compared to last visit in October 2017,  Is not on polypharmacy treatment, seroquel 25 mg 2 tablets every night, Ativan 0.5 milligrams every 8 hours,   I reviewed his record, he has chronic pain, bilateral lower extremity and bilateral hand swelling, renal insufficiency, hypertension, she is no longer ambulatory, has not been walked for few years, paroxysmal atrial fibrillation Eliquis,    He recently had right ear area skin cancer surgery, he wears diaper, need assistance on all daily activities including feeding,  UPDATE Oct 11 2017: He is accompanied by his wife at today's clinical visit, he sits in wheelchair, continued to decline gradually, tends to lean towards the left side, not talking much anymore, worsening memory loss, confused easily, can no longer walk, he is currently a resident at [facility].  UPDATE Sept 8 2020: I reviewed his hospital discharge in October 2019, he was admitted to failure to thrive dehydration, hypernatremia, UTI, acute on chronic renal  failure, paroxysmal atrial fibrillation, hypertension, advanced Parkinson's disease, seizure, decubitus ulcer, hypokalemia,  He is currently on polypharmacy treatment, quazepam 25 mg 2 tablets every night, Eliquis, Lipitor, Coreg, Cymbalta, carbidopa levodopa 25/100 mg 2 tablets 3 times a day, He was switched from Keppra to Depakote due  to at agitation.  he is on Eliquis for his atrial fibrillation.  He is on Aricept for his dementia.  EEG 11/17/2017 showed moderate diffuse slowing of the background.  He is with his wife at today's clinical visit, he really bounced back from previous hospital admission in October, able to carry on simple conversations, but no longer ambulatory, I personally reviewed MRI of the brain 11/19/2017: Moderate chronic small vessel ischemic disease and cerebral atrophy. There was no acute abnormalities.  Laboratory evaluation March 2020 showed normal BNP, lactic acid, CBC showed elevated WBC hemoglobin of 11.9, CMP showed creatinine of 1.54, normal lipase, INR 1.5   REVIEW OF SYSTEMS: Full 14 system review of systems performed and notable only for those listed, all others are neg:  As above.   ALLERGIES: Allergies  Allergen Reactions  . Keppra [Levetiracetam] Other (See Comments)    Increased agitation  . Ciprofloxacin Rash    HOME MEDICATIONS: Outpatient Medications Prior to Visit  Medication Sig Dispense Refill  . Amino Acids-Protein Hydrolys (FEEDING SUPPLEMENT, PRO-STAT SUGAR FREE 64,) LIQD Place 30 mLs into feeding tube daily. 900 mL 0  . apixaban (ELIQUIS) 5 MG TABS tablet Take 5 mg by mouth 2 (two) times daily.    Marland Kitchen atorvastatin (LIPITOR) 40 MG tablet Take 40 mg by mouth daily.    . bisacodyl (DULCOLAX) 10 MG suppository Place 10 mg rectally as needed for moderate constipation.    . carbidopa-levodopa (SINEMET IR) 25-100 MG tablet Take 2 tablets by mouth 3 (three) times daily.     . carvedilol (COREG) 3.125 MG tablet Take 1 tablet (3.125 mg total) by mouth 2 (two) times daily with a meal. 60 tablet 6  . cefixime (SUPRAX) 100 MG/5ML suspension Place 10 mLs (200 mg total) into feeding tube every 12 (twelve) hours. X 3 days 60 mL 0  . Cholecalciferol (VITAMIN D) 2000 UNITS tablet Take 2,000 Units by mouth daily.    . colchicine 0.6 MG tablet Take 0.6 mg by mouth daily.    Marland Kitchen donepezil  (ARICEPT) 5 MG tablet Take 5 mg by mouth at bedtime.    . DULoxetine (CYMBALTA) 20 MG capsule Take 20 mg by mouth 2 (two) times daily.    . famotidine (PEPCID) 20 MG tablet Take 20 mg by mouth as needed for heartburn or indigestion.    . ferrous sulfate 325 (65 FE) MG tablet Take 325 mg by mouth 2 (two) times daily with a meal.     . furosemide (LASIX) 40 MG tablet Take 40 mg by mouth 2 (two) times daily. Every 8 hr for swelling prn     . gabapentin (NEURONTIN) 100 MG capsule Take 100 mg by mouth 3 (three) times daily.    Marland Kitchen guaifenesin (ROBITUSSIN) 100 MG/5ML syrup Take 200 mg by mouth as needed for cough. Take 4m q6h as needed.    .Marland Kitchenipratropium-albuterol (DUONEB) 0.5-2.5 (3) MG/3ML SOLN Take 3 mLs by nebulization every 6 (six) hours as needed (SHORTNESS OF BREATH).     .Marland KitchennitroGLYCERIN (NITROSTAT) 0.4 MG SL tablet Place 0.4 mg under the tongue every 5 (five) minutes as needed for chest pain.    . Nutritional Supplements (FEEDING SUPPLEMENT, OSMOLITE 1.5 CAL,) LIQD  Infuse through PEG @ 60 cc/hr 1000 mL 99  . ondansetron (ZOFRAN) 4 MG tablet Take 4 mg by mouth every 8 (eight) hours as needed for nausea or vomiting.    Marland Kitchen oxyCODONE (OXY IR/ROXICODONE) 5 MG immediate release tablet Place 1 tablet (5 mg total) into feeding tube every 8 (eight) hours. 10 tablet 0  . phosphorus (K PHOS NEUTRAL) 155-852-130 MG tablet Place 2 tablets (500 mg total) into feeding tube 2 (two) times daily. 30 tablet 0  . polyethylene glycol (MIRALAX / GLYCOLAX) packet Take 17 g by mouth 2 (two) times daily.     Marland Kitchen Propylene Glycol (SYSTANE BALANCE) 0.6 % SOLN Apply 2 drops to eye at bedtime as needed (DRY EYES).    . QUEtiapine (SEROQUEL) 25 MG tablet Take 50 mg by mouth at bedtime.    . senna-docusate (SENOKOT-S) 8.6-50 MG per tablet Take 2 tablets by mouth 2 (two) times daily.     . sertraline (ZOLOFT) 25 MG tablet Take 100 mg by mouth daily.     . vitamin B-12 (CYANOCOBALAMIN) 100 MCG tablet Take 100 mcg by mouth daily.     . vitamin C (ASCORBIC ACID) 500 MG tablet Take 500 mg by mouth 2 (two) times daily.    . Water For Irrigation, Sterile (FREE WATER) SOLN Place 200 mLs into feeding tube every 4 (four) hours. 1000 mL    No facility-administered medications prior to visit.     PAST MEDICAL HISTORY: Past Medical History:  Diagnosis Date  . Arthritis   . Bacteremia due to Escherichia coli 06/21/2012  . Cervical vertebral fracture ([place])   . Coronary artery disease    SEVERE 3-VESSEL DX WITH NORMAL EF  . Fall   . Fracture of right olecranon process 10/23/2011  . Gout   . Hypertension   . Kidney stones   . MI (myocardial infarction) ([place]) 08/2011   /H&P (05/12/2012)  . Non-ST elevated myocardial infarction (non-STEMI) ([place])   . PAF (paroxysmal atrial fibrillation) ([place])   . Parkinson's disease   . 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 Adenoma.  . Pyelonephritis, acute 06/23/2012  . Seizures ([place]) 12/10/2011   HAD SEIZURE  . Skin cancer    left shoulder  . Sleep apnea     PAST SURGICAL HISTORY: Past Surgical History:  Procedure Laterality Date  . CARDIAC CATHETERIZATION  06/26/2011   NORMAL LV SYSTOLIC FUNCTION. RCA IS LARGE AND DOMINANT  . CORONARY ANGIOPLASTY WITH STENT PLACEMENT  ~ 2000   "one that I know of"  . ESOPHAGOGASTRODUODENOSCOPY (EGD) WITH PROPOFOL N/A 06/29/2018   Procedure: ESOPHAGOGASTRODUODENOSCOPY (EGD) WITH PROPOFOL;  Surgeon: Aviva Signs, MD;  [facility];  Service: Gastroenterology;  Laterality: N/A;  . HARDWARE REMOVAL  12/29/2011   Procedure: HARDWARE REMOVAL;  Surgeon: Johnny Bridge, MD;  Location: [facility];  Service: Orthopedics;  Laterality: Right;  . ORIF ELBOW FRACTURE  12/29/2011   Procedure: OPEN REDUCTION INTERNAL FIXATION (ORIF) ELBOW/OLECRANON FRACTURE;  Surgeon: Johnny Bridge, MD;  Location: [facility];  Service: Orthopedics;  Laterality: Right;  . PAROTIDECTOMY  05/12/2012   right  . PAROTIDECTOMY  05/12/2012   Procedure: PAROTIDECTOMY;  Surgeon: Rozetta Nunnery,  MD;  Location: [facility];  Service: ENT;  Laterality: Right;  PAROTIDECTOMY MASS  . PEG PLACEMENT N/A 06/29/2018   Procedure: PERCUTANEOUS ENDOSCOPIC GASTROSTOMY (PEG) PLACEMENT;  Surgeon: Aviva Signs, MD;  [facility];  Service: Gastroenterology;  Laterality: N/A;  . SKIN CANCER EXCISION  2013  left shoulder  . SKIN CANCER EXCISION     right ear  . TRANSTHORACIC ECHOCARDIOGRAM  06/26/2011   EF 55-60%    FAMILY HISTORY: Family History  Problem Relation Age of Onset  . Heart attack Father   . Heart disease Other   . Arthritis Other   . Colon cancer Neg Hx   . Liver disease Neg Hx     SOCIAL HISTORY: Social History   Socioeconomic History  . Marital status: Married    Spouse name: Not on file  . Number of children: 1  . Years of education: Bachelors  . Highest education level: Not on file  Occupational History  . Occupation: Disabled  Social Needs  . Financial resource strain: Not on file  . Food insecurity    Worry: Not on file    Inability: Not on file  . Transportation needs    Medical: Not on file    Non-medical: Not on file  Tobacco Use  . Smoking status: Never Smoker  . Smokeless tobacco: Never Used  Substance and Sexual Activity  . Alcohol use: No    Alcohol/week: 0.0 standard drinks  . Drug use: No  . Sexual activity: Yes  Lifestyle  . Physical activity    Days per week: Not on file    Minutes per session: Not on file  . Stress: Not on file  Relationships  . Social Herbalist on phone: Not on file    Gets together: Not on file    Attends religious service: Not on file    Active member of club or organization: Not on file    Attends meetings of clubs or organizations: Not on file    Relationship status: Not on file  . Intimate partner violence    Fear of current or ex partner: Not on file    Emotionally abused: Not on file    Physically abused: Not on file    Forced sexual activity: Not on file  Other Topics Concern  . Not  on file  Social History Narrative   Lives at [facility] home in [place].   Right-handed.   No caffeine use.     PHYSICAL EXAM  There were no vitals filed for this visit. There is no height or weight on file to calculate BMI.  Generalized: Well developed, obese male in no acute distress , well-groomed sitting in wheelchair, soft voice, Head: normocephalic and atraumatic,. Oropharynx benign  Neck: Supple, no carotid bruits  Cardiac: Regular rate rhythm, no murmur  Musculoskeletal: No deformity   Neurological examination   Mentation: Alert dependent on wife to answer questions follow commands,  Cranial nerve II-XII: Pupils were equal round reactive to light extraocular movements were full, visual field were full on confrontational test. Facial sensation and strength were normal. hearing was intact to casual conversation,. Uvula tongue midline. head turning and shoulder shrug were normal and symmetric.Tongue protrusion into cheek strength was normal. Motor: Significant limb and nuchal rigidity, bradykinesia, limited range of motion of bilateral shoulder, barely moves bilateral lower extremities against gravity Sensory: Withdraws to pain   Coordination: finger-nose-finger,  no dysmetria Reflexes: Hypoactive throughout  plantar responses were flexor bilaterally. Gait and Station: In wheelchair not ambulated   DIAGNOSTIC DATA (LABS, IMAGING, TESTING) - I reviewed patient records, labs, notes, testing and imaging myself where available.  Lab Results  Component Value Date   WBC 12.2 (H) 11/13/2018   HGB 11.9 (L) 11/13/2018   HCT 38.5 (L)  11/13/2018   MCV 95.5 11/13/2018   PLT 275 11/13/2018      Component Value Date/Time   NA 141 11/13/2018 1029   K 3.8 11/13/2018 1029   CL 102 11/13/2018 1029   CO2 29 11/13/2018 1029   GLUCOSE 153 (H) 11/13/2018 1029   BUN 21 11/13/2018 1029   BUN 19 08/15/2013   CREATININE 1.54 (H) 11/13/2018 1029   CREATININE 1.28 08/15/2013    CALCIUM 8.6 (L) 11/13/2018 1029   PROT 7.5 11/13/2018 1029   PROT 6.2 10/03/2013   ALBUMIN 3.4 (L) 11/13/2018 1029   ALBUMIN 3.4 10/03/2013   AST 20 11/13/2018 1029   AST 13 10/03/2013   ALT 8 11/13/2018 1029   ALKPHOS 81 11/13/2018 1029   ALKPHOS 75 10/03/2013   BILITOT 0.5 11/13/2018 1029   BILITOT 0.3 10/03/2013   GFRNONAA 44 (L) 11/13/2018 1029   GFRAA 51 (L) 11/13/2018 1029   Lab Results  Component Value Date   CHOL 92 08/22/2014   HDL 28 (L) 08/22/2014   LDLCALC 40 08/22/2014   TRIG 121 08/22/2014   CHOLHDL 3.3 08/22/2014   Lab Results  Component Value Date   HGBA1C 5.5 08/22/2014   Lab Results  Component Value Date   YHCWCBJS28 315 06/20/2012   Lab Results  Component Value Date   TSH 5.370 (H) 08/22/2014      ASSESSMENT AND PLAN ELDER DAVIDIAN is a 73 y.o.  Dementia Parkinsonism Nonambulatory,  He continues to progress slowly, lost ability to ambulate  Long-term nursing home resident  Keep current medications including Seroquel 25 mg 2 tablets at bedtime, carbidopa levodopa 25/100 mg 2 tablets 3 times a day  Will only return to clinic for new issues  Marcial Pacas, M.D. Ph.D.  [facility] [place], [place] [address] Phone: [phone] Fax:      [phone]

## 2019-07-24 ENCOUNTER — Telehealth: Payer: Self-pay

## 2019-07-24 ENCOUNTER — Ambulatory Visit: Payer: Medicare HMO | Admitting: Neurology

## 2019-07-24 ENCOUNTER — Encounter: Payer: Self-pay | Admitting: Neurology

## 2019-07-24 ENCOUNTER — Other Ambulatory Visit: Payer: Self-pay

## 2019-07-24 ENCOUNTER — Ambulatory Visit (INDEPENDENT_AMBULATORY_CARE_PROVIDER_SITE_OTHER): Payer: Medicare HMO | Admitting: Neurology

## 2019-07-24 VITALS — BP 145/86 | HR 72 | Temp 97.8°F

## 2019-07-24 DIAGNOSIS — R269 Unspecified abnormalities of gait and mobility: Secondary | ICD-10-CM

## 2019-07-24 DIAGNOSIS — G20 Parkinson's disease: Secondary | ICD-10-CM | POA: Diagnosis not present

## 2019-07-24 DIAGNOSIS — F0390 Unspecified dementia without behavioral disturbance: Secondary | ICD-10-CM | POA: Diagnosis not present

## 2019-07-24 DIAGNOSIS — G40909 Epilepsy, unspecified, not intractable, without status epilepticus: Secondary | ICD-10-CM | POA: Diagnosis not present

## 2019-07-24 NOTE — Progress Notes (Signed)
[facility]  PATIENT: Mark [name] DOB: Feb 01, 1946   HISTORY OF PRESENT ILLNESS: Mark [name] is a 73 years old right-handed male, accompanied by his wife Mark [name], seen in refer by his primary care doctor Hilbert Corrigan for evaluation of Parkinson's disease, initial evaluation was on June 08 2016.  He came in with wheelchair today, he is currently a resident of [facility] since 2013 Reviewed and summarized the referring note, he had a history of atrial fibrillation on chronic anticoagulation Eliquis, seizure, coronary artery disease, status post stent in 2012, hypertension, history of stroke, memory loss, sleep apnea, hyperlipidemia,  He was diagnosed with Parkinson's disease since 2007, initial symptoms was bradykinesia, difficulty to get in and out of shower, later noted shuffling gait, was treated by Dr. Jannifer Franklin before.  Then he was treated by Dr. Janann Colonel over the past few years.  He was treated with sinemet for many years, now taking Sinemet 25/100 2 tabs tid,  for a while he was taking high dose of Sinemet 25/100 mg up to 2 tablets 5 times a day, even at that dosage, he could not walk, could not stand up even with that dose.  He had sepsis from UTI in 2013, was discharged to nursing home since then, at that time he become wheelchair dependent, also totally dependent on his daily activity, shower, bathing, toileting, feeding, brushing his teeth. He remained in that level of care since.  He has frequent UTI, chronic constipation.  He has no incontinence.  He also had worsening memory loss, he is a retired Tax adviser at age 89 years old, he also had chronic low back pain, he still play cards, bingo, he is easily agitated, he sleeps well, he has delusional idea sometimes, very sensitive to sound,  He is on polypharmacy treatment, zoloft 373m qday, seroquel 269m2 tabs qhs, aricept 73m30mhs, sinemet 25/100 2 tabs tid.   Overall, he had slowly decline  over the past few years, but no dramatic changes, he needs more help to get in and out of his wheelchair, needing lift to transfer. He has recent dysphagia, was seen by speech pathologist, improved with neck flexion technique  Laboratory evaluation in September 2017, elevated creatinine 1.38, glucose 105, hemoglobin 13 point 5, ESR 18,  He had history of seizure since 2007, he is now taking keppra 250m773md, last seizure was around 2013.  UPDATE December 10 2016: He has no recurrent seizure, still taking Keppra 250 mg twice a day, no significant change compared to last visit in October 2017,  He is now on polypharmacy treatment, seroquel 25 mg 2 tablets every night, Ativan 0.5 milligrams every 8 hours,   I reviewed his record, he has chronic pain, bilateral lower extremity and bilateral hand swelling, renal insufficiency, hypertension, she is no longer ambulatory, has not been walked for few years, paroxysmal atrial fibrillation Eliquis,    He recently had right ear area skin cancer surgery, he wears diaper, need assistance on all daily activities including feeding,  UPDATE Oct 11 2017: He is accompanied by his wife at today's clinical visit, he sits in wheelchair, continued to decline gradually, tends to lean towards the left side, not talking much anymore, worsening memory loss, confused easily, can no longer walk, he is currently a resident at [facility].  UPDATE Sept 8 2020: I reviewed his hospital discharge in October 2019, he was admitted to failure to thrive dehydration, hypernatremia, UTI, acute on chronic  renal failure, paroxysmal atrial fibrillation, hypertension, advanced Parkinson's disease, seizure, decubitus ulcer, hypokalemia,  He is currently on polypharmacy treatment, quazepam 25 mg 2 tablets every night, Eliquis, Lipitor, Coreg, Cymbalta, carbidopa levodopa 25/100 mg 2 tablets 3 times a day, He was switched from Keppra to Depakote  due to at agitation.  he is on Eliquis for his atrial fibrillation.  He is on Aricept for his dementia.  EEG 11/17/2017 showed moderate diffuse slowing of the background.  He is with his wife at today's clinical visit, he really bounced back from previous hospital admission in October, able to carry on simple conversations, but no longer ambulatory, I personally reviewed MRI of the brain 11/19/2017: Moderate chronic small vessel ischemic disease and cerebral atrophy. There was no acute abnormalities.  Laboratory evaluation March 2020 showed normal BNP, lactic acid, CBC showed elevated WBC hemoglobin of 11.9, CMP showed creatinine of 1.54, normal lipase, INR 1.5  UPDATE Jul 24 2019: He is accompanied by his wife at today's clinical visit, he had recurrent seizure twice since October 2020, is put on Keppra 500 mg twice daily, he tolerating well, is no longer ambulatory, still taking Sinemet 25/100 mg 2 tablets 3 times a day   REVIEW OF SYSTEMS: Full 14 system review of systems performed and notable only for those listed, all others are neg:  As above.   ALLERGIES: Allergies  Allergen Reactions  . Keppra [Levetiracetam] Other (See Comments)    Increased agitation  . Ciprofloxacin Rash    HOME MEDICATIONS: Outpatient Medications Prior to Visit  Medication Sig Dispense Refill  . Acetaminophen (TYLENOL) 325 MG CAPS Take 650 mg by mouth every 6 (six) hours as needed.    . Amino Acids-Protein Hydrolys (FEEDING SUPPLEMENT, PRO-STAT SUGAR FREE 64,) LIQD Place 30 mLs into feeding tube daily. 900 mL 0  . apixaban (ELIQUIS) 5 MG TABS tablet Take 5 mg by mouth 2 (two) times daily.    Marland Kitchen atorvastatin (LIPITOR) 40 MG tablet Take 40 mg by mouth daily.    . bisacodyl (DULCOLAX) 10 MG suppository Place 10 mg rectally as needed for moderate constipation.    . carbidopa-levodopa (SINEMET IR) 25-100 MG tablet Take 2 tablets by mouth 3 (three) times daily.     . carvedilol (COREG) 3.125 MG tablet Take 1 tablet  (3.125 mg total) by mouth 2 (two) times daily with a meal. 60 tablet 6  . cefixime (SUPRAX) 100 MG/5ML suspension Place 10 mLs (200 mg total) into feeding tube every 12 (twelve) hours. X 3 days 60 mL 0  . Cholecalciferol (VITAMIN D) 2000 UNITS tablet Take 2,000 Units by mouth daily.    . colchicine 0.6 MG tablet Take 0.6 mg by mouth daily.    Marland Kitchen donepezil (ARICEPT) 5 MG tablet Take 5 mg by mouth at bedtime.    . DULoxetine (CYMBALTA) 20 MG capsule Take 20 mg by mouth 2 (two) times daily.    . famotidine (PEPCID) 20 MG tablet Take 20 mg by mouth as needed for heartburn or indigestion.    . ferrous sulfate 325 (65 FE) MG tablet Take 325 mg by mouth 2 (two) times daily with a meal.     . fluocinonide (LIDEX) 0.05 % external solution as needed.    . furosemide (LASIX) 40 MG tablet Take 40 mg by mouth 2 (two) times daily. Every 8 hr for swelling prn     . gabapentin (NEURONTIN) 100 MG capsule Take 100 mg by mouth 3 (three) times daily.    Marland Kitchen  guaifenesin (ROBITUSSIN) 100 MG/5ML syrup Take 200 mg by mouth as needed for cough. Take 50m q6h as needed.    . hydrochlorothiazide (HYDRODIURIL) 12.5 MG tablet Take 12.5 mg by mouth daily.    . hydrocortisone 2.5 % cream Apply 1 application topically as needed (rash).    .Marland Kitchenipratropium-albuterol (DUONEB) 0.5-2.5 (3) MG/3ML SOLN Take 3 mLs by nebulization every 6 (six) hours as needed (SHORTNESS OF BREATH).     .Marland Kitchenipratropium-albuterol (DUONEB) 0.5-2.5 (3) MG/3ML SOLN every 6 (six) hours as needed.    .Marland Kitchenketoconazole (NIZORAL) 2 % shampoo as needed.    .Marland KitchenLINZESS 145 MCG CAPS capsule Take 1 capsule by mouth daily.    .Marland Kitchenloratadine (CLARITIN) 10 MG tablet Take 10 mg by mouth daily.    . nitroGLYCERIN (NITROSTAT) 0.4 MG SL tablet Place 0.4 mg under the tongue every 5 (five) minutes as needed for chest pain.    . Nutritional Supplements (FEEDING SUPPLEMENT, OSMOLITE 1.5 CAL,) LIQD Infuse through PEG @ 60 cc/hr 1000 mL 99  . NYSTATIN powder as needed. Arm pits, groin     . ondansetron (ZOFRAN) 4 MG tablet Take 4 mg by mouth every 8 (eight) hours as needed for nausea or vomiting.    .Marland KitchenoxyCODONE (OXY IR/ROXICODONE) 5 MG immediate release tablet Place 1 tablet (5 mg total) into feeding tube every 8 (eight) hours. 10 tablet 0  . phosphorus (K PHOS NEUTRAL) 155-852-130 MG tablet Place 2 tablets (500 mg total) into feeding tube 2 (two) times daily. 30 tablet 0  . polyethylene glycol (MIRALAX / GLYCOLAX) packet Take 17 g by mouth 2 (two) times daily.     . potassium chloride (KLOR-CON) 20 MEQ packet Take 20 mEq by mouth 2 (two) times daily.    .Marland KitchenPropylene Glycol (SYSTANE BALANCE) 0.6 % SOLN Apply 2 drops to eye at bedtime as needed (DRY EYES).    . QUEtiapine (SEROQUEL) 25 MG tablet Take 50 mg by mouth at bedtime.    . senna-docusate (SENOKOT-S) 8.6-50 MG per tablet Take 2 tablets by mouth 2 (two) times daily.     . sertraline (ZOLOFT) 25 MG tablet Take 100 mg by mouth daily.     . vitamin B-12 (CYANOCOBALAMIN) 100 MCG tablet Take 100 mcg by mouth daily.    . vitamin C (ASCORBIC ACID) 500 MG tablet Take 500 mg by mouth 2 (two) times daily.    . Water For Irrigation, Sterile (FREE WATER) SOLN Place 200 mLs into feeding tube every 4 (four) hours. 1000 mL    No facility-administered medications prior to visit.     PAST MEDICAL HISTORY: Past Medical History:  Diagnosis Date  . Arthritis   . Bacteremia due to Escherichia coli 06/21/2012  . Cervical vertebral fracture ([place]   . Coronary artery disease    SEVERE 3-VESSEL DX WITH NORMAL EF  . Fall   . Fracture of right olecranon process 10/23/2011  . Gout   . Hypertension   . Kidney stones   . Melanoma ([facility]   . [place] ([place] infarction) ([place] 08/2011   /H&P (05/12/2012)  . Non-ST elevated myocardial infarction (non-STEMI) ([facility] [place]   . PAF (paroxysmal atrial fibrillation) ([place]   . Parkinson's disease   . 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 Adenoma.  . Pyelonephritis, acute 06/23/2012  . Seizures ([facility] 12/10/2011   HAD  SEIZURE  . Skin cancer    left shoulder  . Sleep apnea     PAST SURGICAL HISTORY: Past Surgical History:  Procedure Laterality Date  . CARDIAC CATHETERIZATION  06/26/2011   NORMAL LV SYSTOLIC FUNCTION. RCA IS LARGE AND DOMINANT  . CORONARY ANGIOPLASTY WITH STENT PLACEMENT  ~ 2000   "one that I know of"  . ESOPHAGOGASTRODUODENOSCOPY (EGD) WITH PROPOFOL N/A 06/29/2018   Procedure: ESOPHAGOGASTRODUODENOSCOPY (EGD) WITH PROPOFOL;  Surgeon: Aviva Signs, MD;  [facility];  Service: Gastroenterology;  Laterality: N/A;  . HARDWARE REMOVAL  12/29/2011   Procedure: HARDWARE REMOVAL;  Surgeon: Johnny Bridge, MD;  Location: [facility];  Service: Orthopedics;  Laterality: Right;  . ORIF ELBOW FRACTURE  12/29/2011   Procedure: OPEN REDUCTION INTERNAL FIXATION (ORIF) ELBOW/OLECRANON FRACTURE;  Surgeon: Johnny Bridge, MD;  Location: [facility];  Service: Orthopedics;  Laterality: Right;  . PAROTIDECTOMY  05/12/2012   right  . PAROTIDECTOMY  05/12/2012   Procedure: PAROTIDECTOMY;  Surgeon: Rozetta Nunnery, MD;  Location: [facility];  Service: ENT;  Laterality: Right;  PAROTIDECTOMY MASS  . PEG PLACEMENT N/A 06/29/2018   Procedure: PERCUTANEOUS ENDOSCOPIC GASTROSTOMY (PEG) PLACEMENT;  Surgeon: Aviva Signs, MD;  [facility];  Service: Gastroenterology;  Laterality: N/A;  . SKIN CANCER EXCISION  2013   left shoulder  . SKIN CANCER EXCISION     right ear  . TRANSTHORACIC ECHOCARDIOGRAM  06/26/2011   EF 55-60%    FAMILY HISTORY: Family History  Problem Relation Age of Onset  . Heart attack Father   . Heart disease Other   . Arthritis Other   . Colon cancer Neg Hx   . Liver disease Neg Hx     SOCIAL HISTORY: Social History   Socioeconomic History  . Marital status: Married    Spouse name: Not on file  . Number of children: 1  . Years of education: Bachelors  . Highest education level: Not on file  Occupational History  . Occupation: Disabled  Social Needs  . Financial  resource strain: Not on file  . Food insecurity    Worry: Not on file    Inability: Not on file  . Transportation needs    Medical: Not on file    Non-medical: Not on file  Tobacco Use  . Smoking status: Never Smoker  . Smokeless tobacco: Never Used  Substance and Sexual Activity  . Alcohol use: No    Alcohol/week: 0.0 standard drinks  . Drug use: No  . Sexual activity: Yes  Lifestyle  . Physical activity    Days per week: Not on file    Minutes per session: Not on file  . Stress: Not on file  Relationships  . Social Herbalist on phone: Not on file    Gets together: Not on file    Attends religious service: Not on file    Active member of club or organization: Not on file    Attends meetings of clubs or organizations: Not on file    Relationship status: Not on file  . Intimate partner violence    Fear of current or ex partner: Not on file    Emotionally abused: Not on file    Physically abused: Not on file    Forced sexual activity: Not on file  Other Topics Concern  . Not on file  Social History Narrative   Lives at [facility] in [place].   Right-handed.   No caffeine use.     PHYSICAL EXAM  Vitals:   07/24/19 0732  BP: (!) 145/86  Pulse: 72  Temp: 97.8 F (  36.6 C)   There is no height or weight on file to calculate BMI.  Generalized: Well developed, obese male in no acute distress , well-groomed sitting in wheelchair, soft voice, Head: normocephalic and atraumatic,. Oropharynx benign  Neck: Supple, no carotid bruits  Cardiac: Regular rate rhythm, no murmur  Musculoskeletal: No deformity   Neurological examination   Mentation: Alert dependent on wife to answer questions follow commands,  Cranial nerve II-XII: Pupils were equal round reactive to light extraocular movements were full, visual field were full on confrontational test. Facial sensation and strength were normal. hearing was intact to casual conversation,. Uvula tongue  midline. head turning and shoulder shrug were normal and symmetric.Tongue protrusion into cheek strength was normal. Motor: Significant limb and nuchal rigidity, bradykinesia, limited range of motion of bilateral shoulder, barely moves bilateral lower extremities against gravity Sensory: Withdraws to pain   Coordination: finger-nose-finger,  no dysmetria Reflexes: Hypoactive throughout  plantar responses were flexor bilaterally. Gait and Station: In wheelchair not ambulated   DIAGNOSTIC DATA (LABS, IMAGING, TESTING) - I reviewed patient records, labs, notes, testing and imaging myself where available.  Lab Results  Component Value Date   WBC 12.2 (H) 11/13/2018   HGB 11.9 (L) 11/13/2018   HCT 38.5 (L) 11/13/2018   MCV 95.5 11/13/2018   PLT 275 11/13/2018      Component Value Date/Time   NA 141 11/13/2018 1029   K 3.8 11/13/2018 1029   CL 102 11/13/2018 1029   CO2 29 11/13/2018 1029   GLUCOSE 153 (H) 11/13/2018 1029   BUN 21 11/13/2018 1029   BUN 19 08/15/2013   CREATININE 1.54 (H) 11/13/2018 1029   CREATININE 1.28 08/15/2013   CALCIUM 8.6 (L) 11/13/2018 1029   PROT 7.5 11/13/2018 1029   PROT 6.2 10/03/2013   ALBUMIN 3.4 (L) 11/13/2018 1029   ALBUMIN 3.4 10/03/2013   AST 20 11/13/2018 1029   AST 13 10/03/2013   ALT 8 11/13/2018 1029   ALKPHOS 81 11/13/2018 1029   ALKPHOS 75 10/03/2013   BILITOT 0.5 11/13/2018 1029   BILITOT 0.3 10/03/2013   GFRNONAA 44 (L) 11/13/2018 1029   GFRAA 51 (L) 11/13/2018 1029   Lab Results  Component Value Date   CHOL 92 08/22/2014   HDL 28 (L) 08/22/2014   LDLCALC 40 08/22/2014   TRIG 121 08/22/2014   CHOLHDL 3.3 08/22/2014   Lab Results  Component Value Date   HGBA1C 5.5 08/22/2014   Lab Results  Component Value Date   IOXBDZHG99 242 06/20/2012   Lab Results  Component Value Date   TSH 5.370 (H) 08/22/2014      ASSESSMENT AND PLAN RAMESES OU is a 73 y.o.  Dementia Parkinsonism Nonambulatory, Seizure  He  continues to progress slowly, lost ability to ambulate  Long-term nursing home resident  Keep current medications including Seroquel 25 mg 2 tablets at bedtime, decrease carbidopa levodopa 25/100 mg to 1 tablet  3 times a day  Keep Keppra 500 mg twice daily  Will only return to clinic for new issues  Marcial Pacas, M.D. Ph.D.  [facility] [place], [place] [address] Phone: [phone] Fax:      [phone]

## 2019-07-24 NOTE — Telephone Encounter (Signed)
Unable to get in contact with patient. LVM letting him know that his appt with [name] has been cancelled and he will need to call the office to r/s. Office number was provided.    If patient calls back please r/s his appt with [name].

## 2019-08-18 ENCOUNTER — Telehealth: Payer: Self-pay

## 2019-08-18 NOTE — Telephone Encounter (Signed)
Ms Mark [name] called yesterday(12/10) asking for an immediate appointment for the patient. I told her we didn't have a doctor in the office and if she will send [place] some information so that Dr. Aline Brochure could review as soon as he came in this morning I would get back in touch with her with his answer. I received a paper asking for a ortho consult for further imaging of right second finger s/p reported injury. I don't see any x-rays in Epic so I called her back and left message that we need more information (x-ray report at least) so Dr. Aline Brochure could review, as of this morning I have not receive anything else.

## 2019-08-22 ENCOUNTER — Ambulatory Visit (HOSPITAL_COMMUNITY)
Admission: RE | Admit: 2019-08-22 | Discharge: 2019-08-22 | Disposition: A | Discharge status: Home / Self Care | Payer: Medicare HMO | Source: Ambulatory Visit | Attending: Internal Medicine | Admitting: Internal Medicine

## 2019-08-22 ENCOUNTER — Other Ambulatory Visit: Payer: Self-pay

## 2019-08-22 ENCOUNTER — Other Ambulatory Visit (HOSPITAL_COMMUNITY): Payer: Self-pay | Admitting: Internal Medicine

## 2019-08-22 DIAGNOSIS — M79644 Pain in right finger(s): Secondary | ICD-10-CM | POA: Diagnosis not present

## 2019-08-22 NOTE — Telephone Encounter (Signed)
Call received at approximately 8:30am from [facility] radiology department per Traci-states that patient arrived there with facility transportation contact Lynita Lombard from [facility] asking for an Xray to be done.  Relayed, as per previous note, patient has not yet been seen (or scheduled); we understood that mobile Xray may be done, and if so, that report and facility provider notes would be faxed for Dr's review. Patient nor facility was not advised to proceed to Radiology at [facility]. No orders. I callled back to facility transportation at ph#[phone]; reached - stated she will call us back.

## 2019-08-22 NOTE — Telephone Encounter (Signed)
Received call back from [place] [facility] facility; states patient did have an Xray at [facility], and has an appointment scheduled elsewhere, said per resident's wife.

## 2019-08-23 NOTE — Telephone Encounter (Signed)
Facility contact at [facility], relays that patient has had an Xray at [facility], and that facility provider requests orthopaedic appointment.  Please review reports and please advise.

## 2019-08-23 NOTE — Telephone Encounter (Signed)
Facility and patient requests appointment with our clinic as initially noted. Aware of appointment as noted by Dr Aline Brochure.

## 2019-08-23 NOTE — Telephone Encounter (Signed)
I m confused??  He has an appointment   Does he want to come here instead???  If so fu next week 22nd   Looks like we will need a pm clinic on that day

## 2019-08-29 ENCOUNTER — Ambulatory Visit (INDEPENDENT_AMBULATORY_CARE_PROVIDER_SITE_OTHER): Payer: Medicare HMO | Admitting: Orthopedic Surgery

## 2019-08-29 ENCOUNTER — Telehealth: Payer: Self-pay | Admitting: Orthopedic Surgery

## 2019-08-29 ENCOUNTER — Ambulatory Visit: Payer: Medicare HMO

## 2019-08-29 ENCOUNTER — Other Ambulatory Visit: Payer: Self-pay

## 2019-08-29 ENCOUNTER — Encounter: Payer: Self-pay | Admitting: Orthopedic Surgery

## 2019-08-29 VITALS — BP 132/83 | HR 80 | Temp 96.9°F

## 2019-08-29 DIAGNOSIS — S62628A Displaced fracture of medial phalanx of other finger, initial encounter for closed fracture: Secondary | ICD-10-CM

## 2019-08-29 DIAGNOSIS — S62640A Nondisplaced fracture of proximal phalanx of right index finger, initial encounter for closed fracture: Secondary | ICD-10-CM | POA: Diagnosis not present

## 2019-08-29 DIAGNOSIS — S62610A Displaced fracture of proximal phalanx of right index finger, initial encounter for closed fracture: Secondary | ICD-10-CM

## 2019-08-29 NOTE — Progress Notes (Addendum)
Mark [name]  08/29/2019  There is no height or weight on file to calculate BMI.   HISTORY SECTION :  Chief Complaint  Patient presents with  . Finger Injury    right    HPI The patient presents for evaluation of  (mild/moderate/severe/ ) ongoing pain in the PIP joint and MP joint of the right index finger status post injury 2 weeks ago approximately December 8 at a nursing home.  Initial films with mobility over inconclusive patient was brought to the hospital for regular films and initial diagnosis was proximal phalanx fracture however upon my evaluation patient indicated that his finger was straight prior to the injury this was confirmed by history and physical exam  Imaging studies also confirmed both injuries   Review of Systems  Skin: Negative for rash.  Neurological: Negative for tingling.     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   Past Surgical History:  Procedure Laterality Date  . CARDIAC CATHETERIZATION  06/26/2011   NORMAL LV SYSTOLIC FUNCTION. RCA IS LARGE AND DOMINANT  . CORONARY ANGIOPLASTY WITH STENT PLACEMENT  ~ 2000   "one that I know of"  . ESOPHAGOGASTRODUODENOSCOPY (EGD) WITH PROPOFOL N/A 06/29/2018   Procedure: ESOPHAGOGASTRODUODENOSCOPY (EGD) WITH PROPOFOL;  Surgeon: Aviva Signs, MD;  [facility];  Service: Gastroenterology;  Laterality: N/A;  . HARDWARE REMOVAL  12/29/2011   Procedure: HARDWARE REMOVAL;  Surgeon: Johnny Bridge, MD;  Location: [place];  Service: Orthopedics;  Laterality: Right;  . ORIF ELBOW FRACTURE  12/29/2011   Procedure: OPEN REDUCTION INTERNAL FIXATION (ORIF) ELBOW/OLECRANON FRACTURE;  Surgeon: Johnny Bridge, MD;  Location: [facility] [place];  Service: Orthopedics;  Laterality: Right;  . PAROTIDECTOMY  05/12/2012   right  . PAROTIDECTOMY  05/12/2012   Procedure: PAROTIDECTOMY;  Surgeon: Rozetta Nunnery, MD;  Location: [place];  Service: ENT;  Laterality: Right;  PAROTIDECTOMY MASS  . PEG PLACEMENT N/A 06/29/2018   Procedure: PERCUTANEOUS ENDOSCOPIC GASTROSTOMY (PEG) PLACEMENT;  Surgeon: Aviva Signs, MD;  [facility];  Service: Gastroenterology;  Laterality: N/A;  . SKIN CANCER EXCISION  2013   left shoulder  . SKIN CANCER EXCISION     right ear  . TRANSTHORACIC ECHOCARDIOGRAM  06/26/2011   EF 55-60%    There is no height or weight on file to calculate BMI.   Allergies  Allergen Reactions  . Keppra [Levetiracetam] Other (See Comments)    Increased agitation  . Ciprofloxacin Rash     Current Outpatient Medications:  .  apixaban (ELIQUIS) 5 MG TABS tablet, Take 5 mg by mouth 2 (two) times daily., Disp: , Rfl:  .  atorvastatin (LIPITOR) 40 MG tablet, Take 40 mg by mouth daily., Disp: , Rfl:  .  carbidopa-levodopa (SINEMET IR) 25-100 MG tablet, Take 1 tablet by mouth 3 (three) times daily. , Disp: , Rfl:  .  carvedilol (COREG) 3.125 MG tablet, Take 1 tablet (3.125 mg total) by mouth 2 (two) times daily with a meal., Disp: 60 tablet, Rfl: 6 .  Cholecalciferol (VITAMIN D) 2000 UNITS tablet, Take 2,000 Units by mouth daily., Disp: , Rfl:  .  colchicine 0.6  MG tablet, Take 0.6 mg by mouth daily., Disp: , Rfl:  .  donepezil (ARICEPT) 5 MG tablet, Take 5 mg by mouth at bedtime., Disp: , Rfl:  .  DULoxetine (CYMBALTA) 20 MG capsule, Take 20 mg by mouth 2 (two) times daily., Disp: , Rfl:  .  famotidine (PEPCID) 20 MG tablet, Take 20 mg by mouth as needed for heartburn or indigestion., Disp: , Rfl:  .   fluocinonide (LIDEX) 0.05 % external solution, as needed., Disp: , Rfl:  .  furosemide (LASIX) 40 MG tablet, Take 40 mg by mouth 2 (two) times daily. Every 8 hr for swelling prn , Disp: , Rfl:  .  gabapentin (NEURONTIN) 100 MG capsule, Take 100 mg by mouth 3 (three) times daily., Disp: , Rfl:  .  guaifenesin (ROBITUSSIN) 100 MG/5ML syrup, Take 200 mg by mouth as needed for cough. Take 29ml q6h as needed., Disp: , Rfl:  .  hydrochlorothiazide (HYDRODIURIL) 12.5 MG tablet, Take 12.5 mg by mouth daily., Disp: , Rfl:  .  hydrocortisone 2.5 % cream, Apply 1 application topically as needed (rash)., Disp: , Rfl:  .  ipratropium-albuterol (DUONEB) 0.5-2.5 (3) MG/3ML SOLN, Take 3 mLs by nebulization every 6 (six) hours as needed (SHORTNESS OF BREATH). , Disp: , Rfl:  .  ketoconazole (NIZORAL) 2 % shampoo, as needed., Disp: , Rfl:  .  LINZESS 145 MCG CAPS capsule, Take 1 capsule by mouth daily., Disp: , Rfl:  .  nitroGLYCERIN (NITROSTAT) 0.4 MG SL tablet, Place 0.4 mg under the tongue every 5 (five) minutes as needed for chest pain., Disp: , Rfl:  .  ondansetron (ZOFRAN) 4 MG tablet, Take 4 mg by mouth every 8 (eight) hours as needed for nausea or vomiting., Disp: , Rfl:  .  oxyCODONE (OXY IR/ROXICODONE) 5 MG immediate release tablet, Place 1 tablet (5 mg total) into feeding tube every 8 (eight) hours., Disp: 10 tablet, Rfl: 0 .  potassium chloride (KLOR-CON) 20 MEQ packet, Take 20 mEq by mouth 2 (two) times daily., Disp: , Rfl:  .  Propylene Glycol (SYSTANE BALANCE) 0.6 % SOLN, Apply 2 drops to eye at bedtime as needed (DRY EYES)., Disp: , Rfl:  .  QUEtiapine (SEROQUEL) 25 MG tablet, Take 50 mg by mouth at bedtime., Disp: , Rfl:  .  vitamin B-12 (CYANOCOBALAMIN) 100 MCG tablet, Take 100 mcg by mouth daily., Disp: , Rfl:  .  vitamin C (ASCORBIC ACID) 500 MG tablet, Take 500 mg by mouth 2 (two) times daily., Disp: , Rfl:  .  Acetaminophen (TYLENOL) 325 MG CAPS, Take 650 mg by mouth every 6 (six) hours as  needed., Disp: , Rfl:  .  Amino Acids-Protein Hydrolys (FEEDING SUPPLEMENT, PRO-STAT SUGAR FREE 64,) LIQD, Place 30 mLs into feeding tube daily. (Patient not taking: Reported on 08/29/2019), Disp: 900 mL, Rfl: 0 .  bisacodyl (DULCOLAX) 10 MG suppository, Place 10 mg rectally as needed for moderate constipation., Disp: , Rfl:  .  cefixime (SUPRAX) 100 MG/5ML suspension, Place 10 mLs (200 mg total) into feeding tube every 12 (twelve) hours. X 3 days (Patient not taking: Reported on 08/29/2019), Disp: 60 mL, Rfl: 0 .  diphenhydrAMINE (BENADRYL) 25 mg capsule, Take 25 mg by mouth every 4 (four) hours as needed for allergies., Disp: , Rfl:  .  ferrous sulfate 325 (65 FE) MG tablet, Take 325 mg by mouth 2 (two) times daily with a meal. , Disp: , Rfl:  .  levETIRAcetam (KEPPRA) 500 MG tablet, Take 500  mg by mouth 2 (two) times daily., Disp: , Rfl:  .  loratadine (CLARITIN) 10 MG tablet, Take 10 mg by mouth daily., Disp: , Rfl:  .  Nutritional Supplements (FEEDING SUPPLEMENT, OSMOLITE 1.5 CAL,) LIQD, Infuse through PEG @ 60 cc/hr (Patient not taking: Reported on 08/29/2019), Disp: 1000 mL, Rfl: 99 .  NYSTATIN powder, as needed. Arm pits, groin, Disp: , Rfl:  .  phosphorus (K PHOS NEUTRAL) 155-852-130 MG tablet, Place 2 tablets (500 mg total) into feeding tube 2 (two) times daily. (Patient not taking: Reported on 08/29/2019), Disp: 30 tablet, Rfl: 0 .  polyethylene glycol (MIRALAX / GLYCOLAX) packet, Take 17 g by mouth 2 (two) times daily. , Disp: , Rfl:  .  senna-docusate (SENOKOT-S) 8.6-50 MG per tablet, Take 2 tablets by mouth 2 (two) times daily. , Disp: , Rfl:  .  Water For Irrigation, Sterile (FREE WATER) SOLN, Place 200 mLs into feeding tube every 4 (four) hours. (Patient not taking: Reported on 08/29/2019), Disp: 1000 mL, Rfl:    PHYSICAL EXAM SECTION: 1) BP 132/83   Pulse 80   Temp (!) 96.9 F (36.1 C)   There is no height or weight on file to calculate BMI. General appearance: Well-developed  well-nourished no gross deformities  2) Cardiovascular normal pulse and perfusion in the upper extremities normal color without edema  3) Neurologically sensation normal in both upper extremities  4) Psychological: Awake alert and oriented person place mood normal  5) Skin no lacerations or ulcerations no nodularity no palpable masses, no erythema or nodularity  6) Musculoskeletal: Right index finger Deformity of the right index finger with ulnar deviation of the digit at the PIP joint there is a crush injury best seen actually on the lateral x-ray  There is also a nondisplaced intra-articular fracture of the proximal aspect of the phalanx  We see bruising and ecchymosis and tenderness is noted over the PIP joint as well as the MP joint with normal sensation   Left index finger Although there are degenerative changes in the small joints of the fingers in both hands the left hand shows no such deviation of the digits  MEDICAL DECISION SECTION:  Encounter Diagnoses  Name Primary?  . Displaced fracture of middle phalanx of other finger, initial encounter for closed fracture Yes  . Closed nondisplaced fracture of proximal phalanx of right index finger, initial encounter     Imaging Outside image noted.  My interpretation of this image is that the PIP joint has a intra-articular crush injury with ulnar deviation of the proximal and middle phalanx and then a proximal phalanx fracture intra-articular with no angulation  New films were obtained  New films shows that the proximal phalanx portion of the fracture is healing appropriately and the PIP joint shows severe depression of the middle phalanx articular surface continued ulnar deviation of the digit  Plan:  (Rx., Inj., surg., Frx, MRI/CT, XR:2)  The patient would like surgical correction of his deformity and will be sent for consultation with hand surgeon  3:08 PM Arther Abbott, MD  08/29/2019   9:51  AM 09/06/2019  Addendum made to the medical record  In my expert orthopedic surgical opinion based on a reasonable degree of medical certainty based on history physical exam and imaging this is an acute injury caused by trauma.

## 2019-08-29 NOTE — Patient Instructions (Signed)
Referral to HAND SURGERY

## 2019-08-29 NOTE — Telephone Encounter (Signed)
Patient's spouse and power of attorney, Anir Rosener, relays that patient's facility administrator, Michell Heinrich, [facility] [place], facility phone [phone], is requesting a statement from Dr Aline Brochure, indicating as discussed at today's visit, that his finger fractures did not occur because of his arthritis. (We have not received a formal request directly from the facility administrator.) Please advise.

## 2019-08-30 ENCOUNTER — Telehealth: Payer: Self-pay | Admitting: Orthopedic Surgery

## 2019-08-30 NOTE — Telephone Encounter (Signed)
Contacted patient's spouse regarding her signed request for copy of office notes, received at time of yesterday's visit 08/29/19; relayed it will require copy of the power of attorney document to be attached.  States they are at the hand specialist appointment in [place] - said had been there since 8:30am this morning. Will come by on our next business day, 09/04/19, to bring a copy and to pick up the records, since our clinic is closing shortly* *In addition, relayed, as per discussing with our clinic supervisor Abigail Butts, to patient's spouse/POA that [facility] facility also requested a copy of the office notes of 08/29/19, "Stat".  She said that is fine; aware we will be faxing today, per their 'stat' request. ([place], attention Ursula Beath, medical records director, [phone] / fax# [phone])

## 2019-08-30 NOTE — Telephone Encounter (Signed)
Also received a request via fax for office notes from facility, [facility], from [employer], [phone]; [phone]. Asked our [employer] representative to view the request, as it is marked 'Stat."

## 2019-09-06 ENCOUNTER — Telehealth: Payer: Self-pay | Admitting: Orthopedic Surgery

## 2019-09-06 NOTE — Telephone Encounter (Signed)
On 08/30/19, per clinic supervisor, Brand Males, entered and faxed request to facility per contact information noted, at 1:50pm / also called facility to notify at phone extension provided, however, Meagan Bradford's voice mail was full * *Upon return to clinic/re-open date 09/04/19, facility re-sent the request - said they did not receive.  RE-Faxed; confirmed received by Ursula Beath at [facility] (said facility had been giving wrong phone extension.)

## 2019-09-06 NOTE — Telephone Encounter (Signed)
-----   Message from Carole Civil, MD sent at 09/06/2019  9:52 AM EST ----- Please send addendum and the entire office visit note to the patient as requested.  Patient has a power of attorney that requested this information

## 2019-09-06 NOTE — Telephone Encounter (Signed)
Per Dr Ruthe Mannan message, I called patient's spouse, Rayburn Depasquale, designated party contact / power of attorney. States she forgot about coming by with copy of POA forms to be attached to the authorization request, as our form requires. Michela Pitcher will come in the morning (09/07/19).

## 2019-09-07 NOTE — Telephone Encounter (Signed)
Patient's spouse, POA, picked up copy of medical records as per signed authorization request + copy of Power of attorney.

## 2019-09-11 ENCOUNTER — Ambulatory Visit: Payer: Medicare HMO | Admitting: Neurology

## 2019-09-14 ENCOUNTER — Encounter (HOSPITAL_COMMUNITY): Payer: Self-pay

## 2019-09-14 ENCOUNTER — Emergency Department (HOSPITAL_COMMUNITY): Payer: Medicare HMO

## 2019-09-14 ENCOUNTER — Inpatient Hospital Stay (HOSPITAL_COMMUNITY)
Admission: EM | Admit: 2019-09-14 | Discharge: 2019-09-21 | DRG: 871 | Disposition: A | Discharge status: Skilled Nursing Facility | Payer: Medicare HMO | Attending: Family Medicine | Admitting: Family Medicine

## 2019-09-14 ENCOUNTER — Other Ambulatory Visit: Payer: Self-pay

## 2019-09-14 DIAGNOSIS — I252 Old myocardial infarction: Secondary | ICD-10-CM

## 2019-09-14 DIAGNOSIS — E669 Obesity, unspecified: Secondary | ICD-10-CM | POA: Diagnosis present

## 2019-09-14 DIAGNOSIS — Z6831 Body mass index (BMI) 31.0-31.9, adult: Secondary | ICD-10-CM

## 2019-09-14 DIAGNOSIS — N179 Acute kidney failure, unspecified: Secondary | ICD-10-CM | POA: Diagnosis present

## 2019-09-14 DIAGNOSIS — Z79891 Long term (current) use of opiate analgesic: Secondary | ICD-10-CM

## 2019-09-14 DIAGNOSIS — Z7901 Long term (current) use of anticoagulants: Secondary | ICD-10-CM

## 2019-09-14 DIAGNOSIS — R627 Adult failure to thrive: Secondary | ICD-10-CM | POA: Diagnosis present

## 2019-09-14 DIAGNOSIS — M109 Gout, unspecified: Secondary | ICD-10-CM | POA: Diagnosis present

## 2019-09-14 DIAGNOSIS — Z881 Allergy status to other antibiotic agents status: Secondary | ICD-10-CM

## 2019-09-14 DIAGNOSIS — R6521 Severe sepsis with septic shock: Secondary | ICD-10-CM | POA: Diagnosis present

## 2019-09-14 DIAGNOSIS — R4182 Altered mental status, unspecified: Secondary | ICD-10-CM | POA: Diagnosis not present

## 2019-09-14 DIAGNOSIS — U071 COVID-19: Secondary | ICD-10-CM | POA: Diagnosis present

## 2019-09-14 DIAGNOSIS — A419 Sepsis, unspecified organism: Secondary | ICD-10-CM | POA: Diagnosis not present

## 2019-09-14 DIAGNOSIS — I129 Hypertensive chronic kidney disease with stage 1 through stage 4 chronic kidney disease, or unspecified chronic kidney disease: Secondary | ICD-10-CM | POA: Diagnosis present

## 2019-09-14 DIAGNOSIS — Z8249 Family history of ischemic heart disease and other diseases of the circulatory system: Secondary | ICD-10-CM

## 2019-09-14 DIAGNOSIS — Z85820 Personal history of malignant melanoma of skin: Secondary | ICD-10-CM

## 2019-09-14 DIAGNOSIS — D6959 Other secondary thrombocytopenia: Secondary | ICD-10-CM | POA: Diagnosis not present

## 2019-09-14 DIAGNOSIS — G4730 Sleep apnea, unspecified: Secondary | ICD-10-CM | POA: Diagnosis present

## 2019-09-14 DIAGNOSIS — I480 Paroxysmal atrial fibrillation: Secondary | ICD-10-CM | POA: Diagnosis present

## 2019-09-14 DIAGNOSIS — Z931 Gastrostomy status: Secondary | ICD-10-CM

## 2019-09-14 DIAGNOSIS — G40909 Epilepsy, unspecified, not intractable, without status epilepticus: Secondary | ICD-10-CM | POA: Diagnosis present

## 2019-09-14 DIAGNOSIS — E876 Hypokalemia: Secondary | ICD-10-CM | POA: Diagnosis present

## 2019-09-14 DIAGNOSIS — N390 Urinary tract infection, site not specified: Secondary | ICD-10-CM | POA: Diagnosis present

## 2019-09-14 DIAGNOSIS — Z87440 Personal history of urinary (tract) infections: Secondary | ICD-10-CM

## 2019-09-14 DIAGNOSIS — G20 Parkinson's disease: Secondary | ICD-10-CM | POA: Diagnosis present

## 2019-09-14 DIAGNOSIS — L89153 Pressure ulcer of sacral region, stage 3: Secondary | ICD-10-CM | POA: Diagnosis present

## 2019-09-14 DIAGNOSIS — Z66 Do not resuscitate: Secondary | ICD-10-CM | POA: Diagnosis present

## 2019-09-14 DIAGNOSIS — Z7401 Bed confinement status: Secondary | ICD-10-CM

## 2019-09-14 DIAGNOSIS — F0280 Dementia in other diseases classified elsewhere without behavioral disturbance: Secondary | ICD-10-CM | POA: Diagnosis present

## 2019-09-14 DIAGNOSIS — N183 Chronic kidney disease, stage 3 unspecified: Secondary | ICD-10-CM | POA: Diagnosis present

## 2019-09-14 DIAGNOSIS — Z955 Presence of coronary angioplasty implant and graft: Secondary | ICD-10-CM

## 2019-09-14 DIAGNOSIS — J069 Acute upper respiratory infection, unspecified: Secondary | ICD-10-CM | POA: Diagnosis present

## 2019-09-14 DIAGNOSIS — B9620 Unspecified Escherichia coli [E. coli] as the cause of diseases classified elsewhere: Secondary | ICD-10-CM | POA: Diagnosis present

## 2019-09-14 DIAGNOSIS — Z888 Allergy status to other drugs, medicaments and biological substances status: Secondary | ICD-10-CM

## 2019-09-14 DIAGNOSIS — R197 Diarrhea, unspecified: Secondary | ICD-10-CM | POA: Diagnosis not present

## 2019-09-14 DIAGNOSIS — J9601 Acute respiratory failure with hypoxia: Secondary | ICD-10-CM | POA: Diagnosis present

## 2019-09-14 DIAGNOSIS — G9341 Metabolic encephalopathy: Secondary | ICD-10-CM | POA: Diagnosis present

## 2019-09-14 DIAGNOSIS — J1282 Pneumonia due to coronavirus disease 2019: Secondary | ICD-10-CM | POA: Diagnosis present

## 2019-09-14 DIAGNOSIS — E871 Hypo-osmolality and hyponatremia: Secondary | ICD-10-CM | POA: Diagnosis present

## 2019-09-14 DIAGNOSIS — E872 Acidosis: Secondary | ICD-10-CM | POA: Diagnosis present

## 2019-09-14 DIAGNOSIS — E785 Hyperlipidemia, unspecified: Secondary | ICD-10-CM | POA: Diagnosis present

## 2019-09-14 DIAGNOSIS — I2510 Atherosclerotic heart disease of native coronary artery without angina pectoris: Secondary | ICD-10-CM | POA: Diagnosis present

## 2019-09-14 DIAGNOSIS — Z79899 Other long term (current) drug therapy: Secondary | ICD-10-CM

## 2019-09-14 LAB — CBC WITH DIFFERENTIAL/PLATELET
Abs Immature Granulocytes: 0.02 10*3/uL (ref 0.00–0.07)
Basophils Absolute: 0 10*3/uL (ref 0.0–0.1)
Basophils Relative: 0 %
Eosinophils Absolute: 0 10*3/uL (ref 0.0–0.5)
Eosinophils Relative: 1 %
HCT: 43.4 % (ref 39.0–52.0)
Hemoglobin: 13.6 g/dL (ref 13.0–17.0)
Immature Granulocytes: 1 %
Lymphocytes Relative: 37 %
Lymphs Abs: 1.3 10*3/uL (ref 0.7–4.0)
MCH: 30.6 pg (ref 26.0–34.0)
MCHC: 31.3 g/dL (ref 30.0–36.0)
MCV: 97.5 fL (ref 80.0–100.0)
Monocytes Absolute: 0.4 10*3/uL (ref 0.1–1.0)
Monocytes Relative: 12 %
Neutro Abs: 1.7 10*3/uL (ref 1.7–7.7)
Neutrophils Relative %: 49 %
Platelets: 263 10*3/uL (ref 150–400)
RBC: 4.45 MIL/uL (ref 4.22–5.81)
RDW: 14.6 % (ref 11.5–15.5)
WBC: 3.5 10*3/uL — ABNORMAL LOW (ref 4.0–10.5)
nRBC: 0 % (ref 0.0–0.2)

## 2019-09-14 LAB — URINALYSIS, ROUTINE W REFLEX MICROSCOPIC
Bilirubin Urine: NEGATIVE
Glucose, UA: NEGATIVE mg/dL
Ketones, ur: NEGATIVE mg/dL
Nitrite: POSITIVE — AB
Protein, ur: NEGATIVE mg/dL
RBC / HPF: >50 RBC/hpf — ABNORMAL HIGH (ref 0–5)
Specific Gravity, Urine: 1.011 (ref 1.005–1.030)
pH: 6 (ref 5.0–8.0)

## 2019-09-14 LAB — COMPREHENSIVE METABOLIC PANEL
ALT: 5 U/L (ref 0–44)
AST: 21 U/L (ref 15–41)
Albumin: 3.5 g/dL (ref 3.5–5.0)
Alkaline Phosphatase: 88 U/L (ref 38–126)
Anion gap: 11 (ref 5–15)
BUN: 24 mg/dL — ABNORMAL HIGH (ref 8–23)
CO2: 31 mmol/L (ref 22–32)
Calcium: 8.7 mg/dL — ABNORMAL LOW (ref 8.9–10.3)
Chloride: 105 mmol/L (ref 98–111)
Creatinine, Ser: 1.57 mg/dL — ABNORMAL HIGH (ref 0.61–1.24)
GFR calc Af Amer: 50 mL/min — ABNORMAL LOW (ref >60)
GFR calc non Af Amer: 43 mL/min — ABNORMAL LOW (ref >60)
Glucose, Bld: 99 mg/dL (ref 70–99)
Potassium: 3.4 mmol/L — ABNORMAL LOW (ref 3.5–5.1)
Sodium: 147 mmol/L — ABNORMAL HIGH (ref 135–145)
Total Bilirubin: 0.6 mg/dL (ref 0.3–1.2)
Total Protein: 7.8 g/dL (ref 6.5–8.1)

## 2019-09-14 MED ORDER — SODIUM CHLORIDE 0.9 % IV BOLUS
1000.0000 mL | Freq: Once | INTRAVENOUS | Status: AC
  Administered 2019-09-14: 22:00:00 1000 mL via INTRAVENOUS

## 2019-09-14 MED ORDER — SODIUM CHLORIDE 0.9 % IV BOLUS
1000.0000 mL | Freq: Once | INTRAVENOUS | Status: AC
  Administered 2019-09-14: 21:00:00 1000 mL via INTRAVENOUS

## 2019-09-14 MED ORDER — ACETAMINOPHEN 650 MG RE SUPP
650.0000 mg | Freq: Once | RECTAL | Status: AC
  Administered 2019-09-14: 21:00:00 650 mg via RECTAL
  Filled 2019-09-14: qty 1

## 2019-09-14 MED ORDER — ACETAMINOPHEN 325 MG PO TABS
650.0000 mg | ORAL_TABLET | Freq: Once | ORAL | Status: AC
  Administered 2019-09-14: 19:00:00 650 mg via ORAL
  Filled 2019-09-14: qty 2

## 2019-09-14 MED ORDER — IBUPROFEN 100 MG/5ML PO SUSP
600.0000 mg | Freq: Once | ORAL | Status: AC
  Administered 2019-09-14: 23:00:00 600 mg via ORAL
  Filled 2019-09-14: qty 30

## 2019-09-14 MED ORDER — SODIUM CHLORIDE 0.9 % IV BOLUS
1000.0000 mL | Freq: Once | INTRAVENOUS | Status: AC
  Administered 2019-09-14: 1000 mL via INTRAVENOUS

## 2019-09-14 MED ORDER — SODIUM CHLORIDE 0.9 % IV SOLN
1.0000 g | Freq: Once | INTRAVENOUS | Status: AC
  Administered 2019-09-14: 21:00:00 1 g via INTRAVENOUS
  Filled 2019-09-14: qty 10

## 2019-09-14 NOTE — ED Provider Notes (Signed)
[facility] Provider Note   CSN: [id number] Arrival date & time: 09/14/19  1425     History Chief Complaint  Patient presents with  . Failure To Thrive    Mark [name] is a 74 y.o. male with a history significant for HTN, CAD, paroxysmal a fib, advanced Parkinsons disease with subsequent dementia who was diagnosed with Covid 19 on 09/10/19 presenting from his nursing home with a 2 day history of reduced responsiveness and unwillingness to eat or drink anything PO.  Wife at the bedside reports he gets most of his nutrition and fluids through his feeding tube so is not concerned for dehydration.  To her knowledge, he has had no nausea, vomiting, diarrhea. He does get frequent uti's.  She acknowledges that he has been responding to her with one word answers which is reduced from his normal response to her, but does not carry a normal conversation at baseline.   The history is provided by the nursing home and the spouse.       Past Medical History:  Diagnosis Date  . Arthritis   . Bacteremia due to Escherichia coli 06/21/2012  . Cervical vertebral fracture ([facility])   . Coronary artery disease    SEVERE 3-VESSEL DX WITH NORMAL EF  . Fall   . Fracture of right olecranon process 10/23/2011  . Gout   . Hypertension   . Kidney stones   . Melanoma ([place])   . MI (myocardial infarction) ([place]) 08/2011   /H&P (05/12/2012)  . Non-ST elevated myocardial infarction (non-STEMI) ([place])   . PAF (paroxysmal atrial fibrillation) ([place])   . Parkinson's disease   . 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 Adenoma.  . Pyelonephritis, acute 06/23/2012  . Seizures ([place]) 12/10/2011   HAD SEIZURE  . Skin cancer    left shoulder  . Sleep apnea     Patient Active Problem List   Diagnosis Date Noted  . Dementia without behavioral disturbance ([place]) 05/16/2019  . Acute renal failure superimposed on stage 3 chronic kidney disease ([place]) 06/29/2018  . Acute lower UTI 06/29/2018  . Parkinson's disease  ([place])   . Advanced care planning/counseling discussion   . Goals of care, counseling/discussion   . Palliative care by specialist   . Other dysphagia   . Hypernatremia 06/25/2018  . Gait abnormality 05/26/2018  . Left arm swelling 06/15/2016  . 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 Psychosis ([place]) 01/24/2016  . Hallucinations   . Chest pain 08/22/2014  . Coronary artery disease involving native coronary artery of native heart with other form of angina pectoris ([place])   . Essential hypertension   . Hyperlipidemia   . Paroxysmal atrial fibrillation ([facility])   . Colon cancer screening 01/03/2014  . Elevated LFTs 10/05/2013  . Liver lesion 06/23/2012  . Pyelonephritis, acute 06/23/2012  . Hypotension 06/21/2012  . Bacteremia due to Escherichia coli 06/21/2012  . Sepsis ([place]) 06/20/2012  . FTT (failure to thrive) in adult 06/19/2012  . ARF (acute renal failure) ([place]) 06/19/2012  . Urinary tract infection with hematuria 06/19/2012  . Dehydration 06/19/2012  . Frequent falls 06/19/2012  . Hypokalemia 06/19/2012  . Anemia 06/19/2012  . Seizure disorder ([place]) 06/19/2012  . Fracture of right olecranon process 10/23/2011  . Olecranon fracture, recurrent 10/19/2011  . Fall 08/13/2011  . CAD (coronary artery disease) 07/14/2011  . HTN (hypertension) 07/14/2011  . Parkinson disease ([place]) 07/14/2011    Past Surgical History:  Procedure Laterality Date  . CARDIAC CATHETERIZATION  06/26/2011   NORMAL LV SYSTOLIC FUNCTION. RCA IS LARGE AND DOMINANT  . CORONARY ANGIOPLASTY WITH STENT PLACEMENT  ~ 2000   "one that I know of"  . ESOPHAGOGASTRODUODENOSCOPY (EGD) WITH PROPOFOL N/A 06/29/2018   Procedure: ESOPHAGOGASTRODUODENOSCOPY (EGD) WITH PROPOFOL;  Surgeon: Aviva Signs, MD;  [facility];  Service: Gastroenterology;  Laterality: N/A;  . HARDWARE REMOVAL  12/29/2011   Procedure: HARDWARE REMOVAL;  Surgeon: Johnny Bridge, MD;  Location: [facility];  Service: Orthopedics;   Laterality: Right;  . ORIF ELBOW FRACTURE  12/29/2011   Procedure: OPEN REDUCTION INTERNAL FIXATION (ORIF) ELBOW/OLECRANON FRACTURE;  Surgeon: Johnny Bridge, MD;  Location: [facility];  Service: Orthopedics;  Laterality: Right;  . PAROTIDECTOMY  05/12/2012   right  . PAROTIDECTOMY  05/12/2012   Procedure: PAROTIDECTOMY;  Surgeon: Rozetta Nunnery, MD;  Location: [facility];  Service: ENT;  Laterality: Right;  PAROTIDECTOMY MASS  . PEG PLACEMENT N/A 06/29/2018   Procedure: PERCUTANEOUS ENDOSCOPIC GASTROSTOMY (PEG) PLACEMENT;  Surgeon: Aviva Signs, MD;  [facility];  Service: Gastroenterology;  Laterality: N/A;  . SKIN CANCER EXCISION  2013   left shoulder  . SKIN CANCER EXCISION     right ear  . TRANSTHORACIC ECHOCARDIOGRAM  06/26/2011   EF 55-60%       Family History  Problem Relation Age of Onset  . Heart attack Father   . Heart disease Other   . Arthritis Other   . Colon cancer Neg Hx   . Liver disease Neg Hx     Social History   Tobacco Use  . Smoking status: Never Smoker  . Smokeless tobacco: Never Used  Substance Use Topics  . Alcohol use: No    Alcohol/week: 0.0 standard drinks  . Drug use: No    Home Medications Prior to Admission medications   Medication Sig Start Date End Date Taking? Authorizing Provider  Acetaminophen (TYLENOL) 325 MG CAPS Take 650 mg by mouth every 6 (six) hours as needed.    [provider]  Amino Acids-Protein Hydrolys (FEEDING SUPPLEMENT, PRO-STAT SUGAR FREE 64,) LIQD Place 30 mLs into feeding tube daily. Patient not taking: Reported on 08/29/2019 07/01/18   Orson Eva, MD  apixaban (ELIQUIS) 5 MG TABS tablet Take 5 mg by mouth 2 (two) times daily.    [provider]  atorvastatin (LIPITOR) 40 MG tablet Take 40 mg by mouth daily.    [provider]  bisacodyl (DULCOLAX) 10 MG suppository Place 10 mg rectally as needed for moderate constipation.    [provider]  carbidopa-levodopa (SINEMET IR)  25-100 MG tablet Take 1 tablet by mouth 3 (three) times daily.     [provider]  carvedilol (COREG) 3.125 MG tablet Take 1 tablet (3.125 mg total) by mouth 2 (two) times daily with a meal. 04/05/12   Nahser, Wonda Cheng, MD  cefixime (SUPRAX) 100 MG/5ML suspension Place 10 mLs (200 mg total) into feeding tube every 12 (twelve) hours. X 3 days Patient not taking: Reported on 08/29/2019 07/01/18   Orson Eva, MD  Cholecalciferol (VITAMIN D) 2000 UNITS tablet Take 2,000 Units by mouth daily.    [provider]  colchicine 0.6 MG tablet Take 0.6 mg by mouth daily.    [provider]  diphenhydrAMINE (BENADRYL) 25 mg capsule Take 25 mg by mouth every 4 (four) hours as needed for allergies.    [provider]  donepezil (ARICEPT) 5 MG tablet Take 5 mg by mouth at bedtime.  [provider]  DULoxetine (CYMBALTA) 20 MG capsule Take 20 mg by mouth 2 (two) times daily.    [provider]  famotidine (PEPCID) 20 MG tablet Take 20 mg by mouth as needed for heartburn or indigestion.    [provider]  ferrous sulfate 325 (65 FE) MG tablet Take 325 mg by mouth 2 (two) times daily with a meal.     [provider]  fluocinonide (LIDEX) 0.05 % external solution as needed. 02/16/19   [provider]  furosemide (LASIX) 40 MG tablet Take 40 mg by mouth 2 (two) times daily. Every 8 hr for swelling prn     [provider]  gabapentin (NEURONTIN) 100 MG capsule Take 100 mg by mouth 3 (three) times daily.    [provider]  guaifenesin (ROBITUSSIN) 100 MG/5ML syrup Take 200 mg by mouth as needed for cough. Take 35ml q6h as needed.    [provider]  hydrochlorothiazide (HYDRODIURIL) 12.5 MG tablet Take 12.5 mg by mouth daily.    [provider]  hydrocortisone 2.5 % cream Apply 1 application topically as needed (rash).    [provider]  ipratropium-albuterol (DUONEB) 0.5-2.5 (3) MG/3ML SOLN  Take 3 mLs by nebulization every 6 (six) hours as needed (SHORTNESS OF BREATH).     [provider]  ketoconazole (NIZORAL) 2 % shampoo as needed. 04/26/19   [provider]  levETIRAcetam (KEPPRA) 500 MG tablet Take 500 mg by mouth 2 (two) times daily. 07/05/19   [provider]  LINZESS 145 MCG CAPS capsule Take 1 capsule by mouth daily. 02/06/19   [provider]  loratadine (CLARITIN) 10 MG tablet Take 10 mg by mouth daily.    [provider]  nitroGLYCERIN (NITROSTAT) 0.4 MG SL tablet Place 0.4 mg under the tongue every 5 (five) minutes as needed for chest pain.    [provider]  Nutritional Supplements (FEEDING SUPPLEMENT, OSMOLITE 1.5 CAL,) LIQD Infuse through PEG @ 60 cc/hr Patient not taking: Reported on 08/29/2019 06/30/18   Orson Eva, MD  NYSTATIN powder as needed. Arm pits, groin 05/09/19   [provider]  ondansetron (ZOFRAN) 4 MG tablet Take 4 mg by mouth every 8 (eight) hours as needed for nausea or vomiting.    [provider]  oxyCODONE (OXY IR/ROXICODONE) 5 MG immediate release tablet Place 1 tablet (5 mg total) into feeding tube every 8 (eight) hours. 07/01/18   Orson Eva, MD  phosphorus (K PHOS NEUTRAL) [id number] MG tablet Place 2 tablets (500 mg total) into feeding tube 2 (two) times daily. Patient not taking: Reported on 08/29/2019 07/01/18   TatShanon Brow, MD  polyethylene glycol [facility] / Floria Raveling) packet Take 17 g by mouth 2 (two) times daily.     [provider]  potassium chloride (KLOR-CON) 20 MEQ packet Take 20 mEq by mouth 2 (two) times daily.    [provider]  Propylene Glycol (SYSTANE BALANCE) 0.6 % SOLN Apply 2 drops to eye at bedtime as needed (DRY EYES).    [provider]  QUEtiapine (SEROQUEL) 25 MG tablet Take 50 mg by mouth at bedtime.    [provider]  senna-docusate (SENOKOT-S) 8.6-50 MG per tablet Take 2 tablets by mouth 2 (two) times daily.      [provider]  vitamin B-12 (CYANOCOBALAMIN) 100 MCG tablet Take 100 mcg by mouth daily.    [provider]  vitamin C (ASCORBIC ACID) 500 MG tablet Take 500 mg  by mouth 2 (two) times daily.    [provider]  Water For Irrigation, Sterile (FREE WATER) SOLN Place 200 mLs into feeding tube every 4 (four) hours. Patient not taking: Reported on 08/29/2019 06/30/18   Orson Eva, MD    Allergies    Keppra [levetiracetam] and Ciprofloxacin  Review of Systems   Review of Systems  Unable to perform ROS: Dementia  Constitutional: Positive for activity change.    Physical Exam Updated Vital Signs BP 114/76   Pulse 67   Temp 98.9 F (37.2 C) (Rectal)   Resp 18   Ht 6\' 2"  (1.88 m)   Wt 102.8 kg   SpO2 95%   BMI 29.11 kg/m   Physical Exam Vitals and nursing note reviewed.  Constitutional:      Appearance: He is obese. He is not toxic-appearing.  HENT:     Head: Normocephalic and atraumatic.     Mouth/Throat:     Mouth: Mucous membranes are dry.  Eyes:     Conjunctiva/sclera: Conjunctivae normal.  Cardiovascular:     Rate and Rhythm: Normal rate and regular rhythm.     Heart sounds: Normal heart sounds.  Pulmonary:     Effort: Pulmonary effort is normal.     Breath sounds: Normal breath sounds. No wheezing.  Abdominal:     General: Abdomen is protuberant. Bowel sounds are normal.     Palpations: Abdomen is soft.     Tenderness: There is no abdominal tenderness. There is no guarding.  Musculoskeletal:     Comments: velcro splint right hand (wife reports current fractured finger)  Skin:    General: Skin is warm and dry.  Neurological:     Mental Status: He is lethargic.     Comments: Unable to assess secondary to AMS and drowsiness.     ED Results / Procedures / Treatments   Labs (all labs ordered are listed, but only abnormal results are displayed) Labs Reviewed  CBC WITH DIFFERENTIAL/PLATELET - Abnormal; Notable for the following  components:      Result Value   WBC 3.5 (*)    All other components within normal limits  COMPREHENSIVE METABOLIC PANEL - Abnormal; Notable for the following components:   Sodium 147 (*)    Potassium 3.4 (*)    BUN 24 (*)    Creatinine, Ser 1.57 (*)    Calcium 8.7 (*)    GFR calc non Af Amer 43 (*)    GFR calc Af Amer 50 (*)    All other components within normal limits  URINALYSIS, ROUTINE W REFLEX MICROSCOPIC    EKG None  Radiology DG Chest Portable 1 View  Result Date: 09/14/2019 CLINICAL DATA:  Altered mental status history of COVID positive EXAM: PORTABLE CHEST 1 VIEW COMPARISON:  06/25/2018 FINDINGS: Low lung volumes. Small foci of airspace disease in the right mid lung and left base. Mild cardiomegaly with aortic atherosclerosis. No pneumothorax. IMPRESSION: Low lung volumes. Small foci of airspace disease at the left base and right mid lung, could reflect mild pneumonia. Electronically Signed   By: Donavan Foil M.D.   On: 09/14/2019 16:29    Procedures Procedures (including critical care time)  Medications Ordered in ED Medications  sodium chloride 0.9 % bolus 1,000 mL (has no administration in time range)    ED Course  I have reviewed the triage vital signs and the nursing notes.  Pertinent labs & imaging results that were available during my care of the patient were reviewed  by me and considered in my medical decision making (see chart for details).    MDM Rules/Calculators/A&P                      Pt from local nursing facility, dementia at baseline, known covid positive, with increased AMS and reduced oral intake.  UA pending.  CXR suggesting possible early pneumonia.  IV fluids ordered.  Discussed with Tammy Triplett PA-C who will follow pt.  Final Clinical Impression(s) / ED Diagnoses Final diagnoses:  None    Rx / DC Orders ED Discharge Orders    None       Landis Martins 09/14/19 1649    Nat Christen, MD 09/15/19 2235

## 2019-09-14 NOTE — ED Provider Notes (Addendum)
   1650 patient signed out to me by Evalee Jefferson, PA-C at end of shift pending completion of work-up.  Patient is a 74 year old male that comes from [facility] with decreased p.o. intake and altered mental status.  He has a history of [name]'s and dementia.  Patient was diagnosed with Covid on 09/10/2019.  I was notified by nursing that patient having shaking chills, rectal temp of 99.  Tylenol ordered.    1930  U/A shows infection.  Pt resting comfortably.  Urine culture ordered as well as IV Rocephin.  Overall, he appears non-toxic and maintaining O2 saturation above 95% on RA.  I have discussed care plan with Dr. Lacinda Axon who has also seen the patient.  I feel that he is appropriate for d/c back to the SNF and rx for Keflex written.     Labs Reviewed  CBC WITH DIFFERENTIAL/PLATELET - Abnormal; Notable for the following components:      Result Value   WBC 3.5 (*)    All other components within normal limits  COMPREHENSIVE METABOLIC PANEL - Abnormal; Notable for the following components:   Sodium 147 (*)    Potassium 3.4 (*)    BUN 24 (*)    Creatinine, Ser 1.57 (*)    Calcium 8.7 (*)    GFR calc non Af Amer 43 (*)    GFR calc Af Amer 50 (*)    All other components within normal limits  URINALYSIS, ROUTINE W REFLEX MICROSCOPIC - Abnormal; Notable for the following components:   APPearance HAZY (*)    Hgb urine dipstick LARGE (*)    Nitrite POSITIVE (*)    Leukocytes,Ua SMALL (*)    RBC / HPF >50 (*)    Bacteria, UA FEW (*)    All other components within normal limits  CULTURE, BLOOD (ROUTINE X 2)  URINE CULTURE  CULTURE, BLOOD (ROUTINE X 2)  LACTIC ACID, PLASMA  LACTIC ACID, PLASMA   1950  I was called into the room, pt now appears altered, spitting up yellow fluid.  Rectal temp now 102.5.  Spoke with patient's wife, he is DNR.  Pt will be given additional IVF's, tylenol suppository, and blood cultures and lactic acid ordered.  Sepsis was not evident in initial work  up.  Dr. Lacinda Axon also at bedside.    Repeat rectal temp still 102, ibuprofen susp ordered per feeding tube.  BP improving with IVF's   CRITICAL CARE Performed by: Mark [name] Total critical care time: 60 minutes Critical care time was exclusive of separately billable procedures and treating other patients. Critical care was necessary to treat or prevent imminent or life-threatening deterioration. Critical care was time spent personally by me on the following activities: development of treatment plan with patient and/or surrogate as well as nursing, discussions with consultants, evaluation of patient's response to treatment, examination of patient, obtaining history from patient or surrogate, ordering and performing treatments and interventions, ordering and review of laboratory studies, ordering and review of radiographic studies, pulse oximetry and re-evaluation of patient's condition.   [facility] hospitalist, Dr. Darrick Meigs who agrees to admit      Mark Parkinson, PA-C 09/14/19 [place], Mark Tipler, PA-C 09/14/19 2302    Nat Christen, MD 09/15/19 2206    Nat Christen, MD 09/15/19 2210

## 2019-09-14 NOTE — ED Triage Notes (Signed)
Pt brought to ED via [facility] for AMS. Pt covid + on 1/3. Pt has not been eating or drinking per staff x 1.5 days.

## 2019-09-14 NOTE — ED Notes (Signed)
Attempted to in and out cath patient, unable to get urine. Condom cath placed.

## 2019-09-15 ENCOUNTER — Other Ambulatory Visit: Payer: Self-pay

## 2019-09-15 DIAGNOSIS — Z515 Encounter for palliative care: Secondary | ICD-10-CM | POA: Diagnosis not present

## 2019-09-15 DIAGNOSIS — U071 COVID-19: Secondary | ICD-10-CM | POA: Diagnosis present

## 2019-09-15 DIAGNOSIS — I480 Paroxysmal atrial fibrillation: Secondary | ICD-10-CM | POA: Diagnosis present

## 2019-09-15 DIAGNOSIS — L89153 Pressure ulcer of sacral region, stage 3: Secondary | ICD-10-CM | POA: Diagnosis present

## 2019-09-15 DIAGNOSIS — J069 Acute upper respiratory infection, unspecified: Secondary | ICD-10-CM | POA: Diagnosis not present

## 2019-09-15 DIAGNOSIS — E872 Acidosis: Secondary | ICD-10-CM | POA: Diagnosis present

## 2019-09-15 DIAGNOSIS — F0280 Dementia in other diseases classified elsewhere without behavioral disturbance: Secondary | ICD-10-CM | POA: Diagnosis present

## 2019-09-15 DIAGNOSIS — J9601 Acute respiratory failure with hypoxia: Secondary | ICD-10-CM | POA: Diagnosis present

## 2019-09-15 DIAGNOSIS — R4182 Altered mental status, unspecified: Secondary | ICD-10-CM

## 2019-09-15 DIAGNOSIS — N390 Urinary tract infection, site not specified: Secondary | ICD-10-CM | POA: Diagnosis present

## 2019-09-15 DIAGNOSIS — R6521 Severe sepsis with septic shock: Secondary | ICD-10-CM

## 2019-09-15 DIAGNOSIS — Z66 Do not resuscitate: Secondary | ICD-10-CM | POA: Diagnosis present

## 2019-09-15 DIAGNOSIS — I129 Hypertensive chronic kidney disease with stage 1 through stage 4 chronic kidney disease, or unspecified chronic kidney disease: Secondary | ICD-10-CM | POA: Diagnosis present

## 2019-09-15 DIAGNOSIS — R197 Diarrhea, unspecified: Secondary | ICD-10-CM | POA: Diagnosis not present

## 2019-09-15 DIAGNOSIS — Z7189 Other specified counseling: Secondary | ICD-10-CM | POA: Diagnosis not present

## 2019-09-15 DIAGNOSIS — M109 Gout, unspecified: Secondary | ICD-10-CM | POA: Diagnosis present

## 2019-09-15 DIAGNOSIS — E871 Hypo-osmolality and hyponatremia: Secondary | ICD-10-CM | POA: Diagnosis present

## 2019-09-15 DIAGNOSIS — B9620 Unspecified Escherichia coli [E. coli] as the cause of diseases classified elsewhere: Secondary | ICD-10-CM | POA: Diagnosis present

## 2019-09-15 DIAGNOSIS — R7881 Bacteremia: Secondary | ICD-10-CM | POA: Diagnosis not present

## 2019-09-15 DIAGNOSIS — J1282 Pneumonia due to coronavirus disease 2019: Secondary | ICD-10-CM | POA: Diagnosis present

## 2019-09-15 DIAGNOSIS — I252 Old myocardial infarction: Secondary | ICD-10-CM | POA: Diagnosis not present

## 2019-09-15 DIAGNOSIS — G20 Parkinson's disease: Secondary | ICD-10-CM | POA: Diagnosis present

## 2019-09-15 DIAGNOSIS — A419 Sepsis, unspecified organism: Principal | ICD-10-CM

## 2019-09-15 DIAGNOSIS — D6959 Other secondary thrombocytopenia: Secondary | ICD-10-CM | POA: Diagnosis not present

## 2019-09-15 DIAGNOSIS — R627 Adult failure to thrive: Secondary | ICD-10-CM | POA: Diagnosis present

## 2019-09-15 DIAGNOSIS — G9341 Metabolic encephalopathy: Secondary | ICD-10-CM | POA: Diagnosis present

## 2019-09-15 DIAGNOSIS — E876 Hypokalemia: Secondary | ICD-10-CM | POA: Diagnosis present

## 2019-09-15 DIAGNOSIS — N179 Acute kidney failure, unspecified: Secondary | ICD-10-CM | POA: Diagnosis present

## 2019-09-15 DIAGNOSIS — I2510 Atherosclerotic heart disease of native coronary artery without angina pectoris: Secondary | ICD-10-CM | POA: Diagnosis present

## 2019-09-15 LAB — BLOOD CULTURE ID PANEL (REFLEXED)

## 2019-09-15 LAB — RESPIRATORY PANEL BY RT PCR (FLU A&B, COVID)
Influenza A by PCR: NEGATIVE
Influenza B by PCR: NEGATIVE
SARS Coronavirus 2 by RT PCR: POSITIVE — AB

## 2019-09-15 LAB — CBC WITH DIFFERENTIAL/PLATELET
Abs Immature Granulocytes: 0.24 10*3/uL — ABNORMAL HIGH (ref 0.00–0.07)
Basophils Absolute: 0 10*3/uL (ref 0.0–0.1)
Basophils Relative: 0 %
Eosinophils Absolute: 0 10*3/uL (ref 0.0–0.5)
Eosinophils Relative: 0 %
HCT: 39 % (ref 39.0–52.0)
Hemoglobin: 11.9 g/dL — ABNORMAL LOW (ref 13.0–17.0)
Immature Granulocytes: 5 %
Lymphocytes Relative: 5 %
Lymphs Abs: 0.3 10*3/uL — ABNORMAL LOW (ref 0.7–4.0)
MCH: 29.8 pg (ref 26.0–34.0)
MCHC: 30.5 g/dL (ref 30.0–36.0)
MCV: 97.5 fL (ref 80.0–100.0)
Monocytes Absolute: 0 10*3/uL — ABNORMAL LOW (ref 0.1–1.0)
Monocytes Relative: 1 %
Neutro Abs: 4.2 10*3/uL (ref 1.7–7.7)
Neutrophils Relative %: 89 %
Platelets: 170 10*3/uL (ref 150–400)
RBC: 4 MIL/uL — ABNORMAL LOW (ref 4.22–5.81)
RDW: 14.9 % (ref 11.5–15.5)
WBC: 4.7 10*3/uL (ref 4.0–10.5)
nRBC: 0 % (ref 0.0–0.2)

## 2019-09-15 LAB — COMPREHENSIVE METABOLIC PANEL
ALT: 21 U/L (ref 0–44)
AST: 48 U/L — ABNORMAL HIGH (ref 15–41)
Albumin: 2.8 g/dL — ABNORMAL LOW (ref 3.5–5.0)
Alkaline Phosphatase: 97 U/L (ref 38–126)
Anion gap: 14 (ref 5–15)
BUN: 27 mg/dL — ABNORMAL HIGH (ref 8–23)
CO2: 19 mmol/L — ABNORMAL LOW (ref 22–32)
Calcium: 7.4 mg/dL — ABNORMAL LOW (ref 8.9–10.3)
Chloride: 112 mmol/L — ABNORMAL HIGH (ref 98–111)
Creatinine, Ser: 2.29 mg/dL — ABNORMAL HIGH (ref 0.61–1.24)
GFR calc Af Amer: 32 mL/min — ABNORMAL LOW (ref >60)
GFR calc non Af Amer: 27 mL/min — ABNORMAL LOW (ref >60)
Glucose, Bld: 115 mg/dL — ABNORMAL HIGH (ref 70–99)
Potassium: 2.6 mmol/L — CL (ref 3.5–5.1)
Sodium: 145 mmol/L (ref 135–145)
Total Bilirubin: 1.7 mg/dL — ABNORMAL HIGH (ref 0.3–1.2)
Total Protein: 6.1 g/dL — ABNORMAL LOW (ref 6.5–8.1)

## 2019-09-15 LAB — MAGNESIUM: Magnesium: 1.5 mg/dL — ABNORMAL LOW (ref 1.7–2.4)

## 2019-09-15 LAB — LACTIC ACID, PLASMA
Lactic Acid, Venous: 3.5 mmol/L (ref 0.5–1.9)
Lactic Acid, Venous: 3.6 mmol/L (ref 0.5–1.9)

## 2019-09-15 LAB — C-REACTIVE PROTEIN
CRP: 1.1 mg/dL — ABNORMAL HIGH (ref <1.0)
CRP: 4.7 mg/dL — ABNORMAL HIGH (ref <1.0)

## 2019-09-15 LAB — FERRITIN
Ferritin: 386 ng/mL — ABNORMAL HIGH (ref 24–336)
Ferritin: 1780 ng/mL — ABNORMAL HIGH (ref 24–336)

## 2019-09-15 LAB — D-DIMER, QUANTITATIVE: D-Dimer, Quant: 3.64 ug/mL-FEU — ABNORMAL HIGH (ref 0.00–0.50)

## 2019-09-15 LAB — ABO/RH: ABO/RH(D): O POS

## 2019-09-15 MED ORDER — PRO-STAT SUGAR FREE PO LIQD
30.0000 mL | Freq: Every day | ORAL | Status: DC
  Administered 2019-09-16 – 2019-09-21 (×6): 30 mL
  Filled 2019-09-15 (×8): qty 30

## 2019-09-15 MED ORDER — APIXABAN 5 MG PO TABS
5.0000 mg | ORAL_TABLET | Freq: Two times a day (BID) | ORAL | Status: DC
  Administered 2019-09-15 – 2019-09-16 (×5): 5 mg via ORAL
  Filled 2019-09-15 (×5): qty 1

## 2019-09-15 MED ORDER — SODIUM CHLORIDE 0.9 % IV SOLN: 100.0000 mg | Freq: Every day | INTRAVENOUS | Status: DC

## 2019-09-15 MED ORDER — PRO-STAT SUGAR FREE PO LIQD: 30.0000 mL | Freq: Every day | ORAL | Status: DC

## 2019-09-15 MED ORDER — DONEPEZIL HCL 5 MG PO TABS
5.0000 mg | ORAL_TABLET | Freq: Every day | ORAL | Status: DC
  Administered 2019-09-15 – 2019-09-16 (×3): 5 mg via ORAL
  Filled 2019-09-15 (×7): qty 1

## 2019-09-15 MED ORDER — MAGNESIUM SULFATE IN D5W 1-5 GM/100ML-% IV SOLN
1.0000 g | Freq: Once | INTRAVENOUS | Status: AC
  Administered 2019-09-15: 11:00:00 1 g via INTRAVENOUS
  Filled 2019-09-15: qty 100

## 2019-09-15 MED ORDER — ACETAMINOPHEN 650 MG RE SUPP
650.0000 mg | RECTAL | Status: DC | PRN
  Administered 2019-09-15: 650 mg via RECTAL
  Filled 2019-09-15: qty 1

## 2019-09-15 MED ORDER — NOREPINEPHRINE 4 MG/250ML-% IV SOLN
0.0000 ug/min | INTRAVENOUS | Status: DC
  Administered 2019-09-15: 16:00:00 8 ug/min via INTRAVENOUS
  Administered 2019-09-15 (×2): 9.333 ug/min via INTRAVENOUS
  Administered 2019-09-15: 2 ug/min via INTRAVENOUS
  Administered 2019-09-16: 10 ug/min via INTRAVENOUS
  Administered 2019-09-16: 16:00:00 7 ug/min via INTRAVENOUS
  Administered 2019-09-16: 06:00:00 8 ug/min via INTRAVENOUS
  Administered 2019-09-17: 01:00:00 5 ug/min via INTRAVENOUS
  Filled 2019-09-15 (×8): qty 250

## 2019-09-15 MED ORDER — DULOXETINE HCL 20 MG PO CPEP
20.0000 mg | ORAL_CAPSULE | Freq: Two times a day (BID) | ORAL | Status: DC
  Administered 2019-09-15 – 2019-09-19 (×5): 20 mg via ORAL
  Filled 2019-09-15 (×18): qty 1

## 2019-09-15 MED ORDER — SODIUM CHLORIDE 0.9 % IV SOLN
1.0000 g | INTRAVENOUS | Status: DC
  Filled 2019-09-15: qty 10

## 2019-09-15 MED ORDER — NOREPINEPHRINE BITARTRATE 1 MG/ML IV SOLN: 8.0000 ug/kg/min | Freq: Once | INTRAVENOUS | Status: DC

## 2019-09-15 MED ORDER — FREE WATER
200.0000 mL | Status: DC
  Administered 2019-09-15 – 2019-09-17 (×13): 200 mL

## 2019-09-15 MED ORDER — CARBIDOPA-LEVODOPA 25-100 MG PO TABS
1.0000 | ORAL_TABLET | Freq: Three times a day (TID) | ORAL | Status: DC
  Administered 2019-09-15 – 2019-09-16 (×4): 1 via ORAL
  Filled 2019-09-15 (×13): qty 1

## 2019-09-15 MED ORDER — SODIUM CHLORIDE 0.9 % IV SOLN: 200.0000 mg | Freq: Once | INTRAVENOUS | Status: DC

## 2019-09-15 MED ORDER — SODIUM CHLORIDE 0.9 % IV SOLN
1.0000 g | Freq: Two times a day (BID) | INTRAVENOUS | Status: DC
  Administered 2019-09-15 – 2019-09-17 (×6): 1 g via INTRAVENOUS
  Filled 2019-09-15 (×6): qty 1

## 2019-09-15 MED ORDER — GABAPENTIN 100 MG PO CAPS
100.0000 mg | ORAL_CAPSULE | Freq: Three times a day (TID) | ORAL | Status: DC
  Administered 2019-09-15 – 2019-09-16 (×5): 100 mg via ORAL
  Filled 2019-09-15 (×6): qty 1

## 2019-09-15 MED ORDER — ONDANSETRON HCL 4 MG PO TABS: 4.0000 mg | ORAL_TABLET | Freq: Four times a day (QID) | ORAL | Status: DC | PRN

## 2019-09-15 MED ORDER — OSMOLITE 1.5 CAL PO LIQD
1000.0000 mL | ORAL | Status: DC
  Administered 2019-09-16 – 2019-09-20 (×6): 1000 mL
  Filled 2019-09-15 (×5): qty 1000

## 2019-09-15 MED ORDER — CHLORHEXIDINE GLUCONATE CLOTH 2 % EX PADS
6.0000 | MEDICATED_PAD | Freq: Every day | CUTANEOUS | Status: DC
  Administered 2019-09-15 – 2019-09-21 (×7): 6 via TOPICAL

## 2019-09-15 MED ORDER — DEXAMETHASONE SODIUM PHOSPHATE 10 MG/ML IJ SOLN
6.0000 mg | INTRAMUSCULAR | Status: DC
  Administered 2019-09-15 – 2019-09-21 (×7): 6 mg via INTRAVENOUS
  Filled 2019-09-15 (×7): qty 1

## 2019-09-15 MED ORDER — ONDANSETRON HCL 4 MG/2ML IJ SOLN: 4.0000 mg | Freq: Four times a day (QID) | INTRAMUSCULAR | Status: DC | PRN

## 2019-09-15 MED ORDER — SODIUM CHLORIDE 0.9 % IV BOLUS
500.0000 mL | Freq: Once | INTRAVENOUS | Status: AC
  Administered 2019-09-15: 02:00:00 500 mL via INTRAVENOUS

## 2019-09-15 MED ORDER — SODIUM CHLORIDE 0.9 % IV SOLN
100.0000 mg | Freq: Every day | INTRAVENOUS | Status: AC
  Administered 2019-09-16 – 2019-09-19 (×4): 100 mg via INTRAVENOUS
  Filled 2019-09-15: qty 100
  Filled 2019-09-15: qty 20
  Filled 2019-09-15 (×2): qty 100
  Filled 2019-09-15: qty 20

## 2019-09-15 MED ORDER — SODIUM CHLORIDE 0.9 % IV SOLN
200.0000 mg | Freq: Once | INTRAVENOUS | Status: AC
  Administered 2019-09-15: 02:00:00 200 mg via INTRAVENOUS
  Filled 2019-09-15: qty 40

## 2019-09-15 MED ORDER — ATORVASTATIN CALCIUM 40 MG PO TABS
40.0000 mg | ORAL_TABLET | Freq: Every day | ORAL | Status: DC
  Administered 2019-09-15 – 2019-09-16 (×2): 40 mg via ORAL
  Filled 2019-09-15 (×3): qty 1

## 2019-09-15 MED ORDER — QUETIAPINE FUMARATE 25 MG PO TABS
50.0000 mg | ORAL_TABLET | Freq: Every day | ORAL | Status: DC
  Administered 2019-09-15 – 2019-09-16 (×3): 50 mg via ORAL
  Filled 2019-09-15 (×3): qty 2

## 2019-09-15 MED ORDER — POTASSIUM CHLORIDE 10 MEQ/100ML IV SOLN
10.0000 meq | INTRAVENOUS | Status: AC
  Administered 2019-09-15 (×3): 10 meq via INTRAVENOUS
  Filled 2019-09-15 (×3): qty 100

## 2019-09-15 MED ORDER — LEVETIRACETAM 500 MG PO TABS
500.0000 mg | ORAL_TABLET | Freq: Two times a day (BID) | ORAL | Status: DC
  Administered 2019-09-15 – 2019-09-16 (×5): 500 mg via ORAL
  Filled 2019-09-15 (×5): qty 1

## 2019-09-15 MED ORDER — SODIUM CHLORIDE 0.9 % IV SOLN: Freq: Once | INTRAVENOUS | Status: AC

## 2019-09-15 MED ORDER — SCOPOLAMINE 1 MG/3DAYS TD PT72
1.0000 | MEDICATED_PATCH | TRANSDERMAL | Status: DC
  Administered 2019-09-18: 16:00:00 1.5 mg via TRANSDERMAL
  Filled 2019-09-15 (×5): qty 1

## 2019-09-15 NOTE — ED Notes (Signed)
Spouse and daughter in room

## 2019-09-15 NOTE — ED Provider Notes (Signed)
Called to room as o2 sats low, and bp low. Pt has been admitted by our hospitalist team.   Patient receiving ns bolus, and respiratory therapy consult re high flow o2. Pt is mouth breather - will transition to mask.   o2 sats improved.   On discussion with family/wife, patient at baseline has advanced Parkinson's/dementia, is bed bound and not verbally communicative. Discussed care wishes - patient/family would like to continue medical care, iv fluids, medications, non-invasive o2, etc, and to focus on patient comfort and avoidance of suffering/discomfort. however, no CPR, on intubation/mechanical ventilation.  Patient is critically ill, with multi-system failure, and has multiple co-morbidities, and has a very limited/grave prognosis. Support offered to family by staff.      Lajean Saver, MD 09/15/19 [phone]

## 2019-09-15 NOTE — ED Notes (Signed)
Rectal tylenol given for fever

## 2019-09-15 NOTE — H&P (Addendum)
TRH H&P    Patient Demographics:    Mark [name], is a 74 y.o. male  MRN: [id number]  DOB - [birth year]  Admit Date - 09/14/2019  Referring MD/NP/PA: Kem Parkinson  Outpatient Primary MD for the patient is Caprice Renshaw, MD  Patient coming from: Home  Chief complaint-generalized weakness, poor p.o. intake   HPI:    Mark [name]  is a 74 y.o. male, with history of hypertension, CAD, paroxysmal atrial fibrillation, advanced Parkinson disease, dementia, paroxysmal atrial fibrillation on apixaban, hypertension, CKD stage III, history of seizures who was diagnosed with COVID-19 infection on 09/10/2019.  Presented from nursing home with 2-day history of reduced responsiveness and poor p.o. intake.  Patient gets most of his nutrition and fluid through the feeding tube.  There is no history of nausea vomiting or diarrhea.  He does have history of frequent UTIs.  Patient has been less verbal as per patient's wife.  Patient usually not able to carry conversation at baseline. In the ED patient was found to have UTI, and was going to be discharged home.  Patient became hypotensive in the ED, he was given Rocephin and IV fluid boluses.  Currently he is requiring 4 L/min of oxygen.  He is unresponsive and unable to provide any history.     Review of systems:    In addition to the HPI above,   All other systems reviewed and are negative.    Past History of the following :    Past Medical History:  Diagnosis Date  . Arthritis   . Bacteremia due to Escherichia coli 06/21/2012  . Cervical vertebral fracture ([place])   . Coronary artery disease    SEVERE 3-VESSEL DX WITH NORMAL EF  . Fall   . Fracture of right olecranon process 10/23/2011  . Gout   . Hypertension   . Kidney stones   . Melanoma ([place])   . MI (myocardial infarction) ([place]) 08/2011   /H&P (05/12/2012)  . Non-ST elevated myocardial infarction (non-STEMI) ([place])    . PAF (paroxysmal atrial fibrillation) ([place])   . Parkinson's disease   . 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 Adenoma.  . Pyelonephritis, acute 06/23/2012  . Seizures ([place]) 12/10/2011   HAD SEIZURE  . Skin cancer    left shoulder  . Sleep apnea       Past Surgical History:  Procedure Laterality Date  . CARDIAC CATHETERIZATION  06/26/2011   NORMAL LV SYSTOLIC FUNCTION. RCA IS LARGE AND DOMINANT  . CORONARY ANGIOPLASTY WITH STENT PLACEMENT  ~ 2000   "one that I know of"  . ESOPHAGOGASTRODUODENOSCOPY (EGD) WITH PROPOFOL N/A 06/29/2018   Procedure: ESOPHAGOGASTRODUODENOSCOPY (EGD) WITH PROPOFOL;  Surgeon: Aviva Signs, MD;  [facility];  Service: Gastroenterology;  Laterality: N/A;  . HARDWARE REMOVAL  12/29/2011   Procedure: HARDWARE REMOVAL;  Surgeon: Johnny Bridge, MD;  Location: [place];  Service: Orthopedics;  Laterality: Right;  . ORIF ELBOW FRACTURE  12/29/2011   Procedure: OPEN REDUCTION INTERNAL FIXATION (ORIF) ELBOW/OLECRANON FRACTURE;  Surgeon: Johnny Bridge, MD;  Location: [place];  Service: Orthopedics;  Laterality: Right;  . PAROTIDECTOMY  05/12/2012   right  . PAROTIDECTOMY  05/12/2012   Procedure: PAROTIDECTOMY;  Surgeon: Rozetta Nunnery, MD;  Location: [place];  Service: ENT;  Laterality: Right;  PAROTIDECTOMY MASS  . PEG PLACEMENT N/A 06/29/2018   Procedure: PERCUTANEOUS ENDOSCOPIC GASTROSTOMY (PEG) PLACEMENT;  Surgeon: Aviva Signs, MD;  [facility];  Service: Gastroenterology;  Laterality: N/A;  . SKIN CANCER EXCISION  2013   left shoulder  . SKIN CANCER EXCISION     right ear  . TRANSTHORACIC ECHOCARDIOGRAM  06/26/2011   EF 55-60%      Social History:      Social History   Tobacco Use  . Smoking status: Never Smoker  . Smokeless tobacco: Never Used  Substance Use Topics  . Alcohol use: No    Alcohol/week: 0.0 standard drinks       Family History :     Family History  Problem Relation Age of Onset  . Heart attack Father   . Heart  disease Other   . Arthritis Other   . Colon cancer Neg Hx   . Liver disease Neg Hx       Home Medications:   Prior to Admission medications   Medication Sig Start Date End Date Taking? Authorizing Provider  Acetaminophen (TYLENOL) 325 MG CAPS Take 650 mg by mouth every 6 (six) hours as needed.    [provider]  Amino Acids-Protein Hydrolys (FEEDING SUPPLEMENT, PRO-STAT SUGAR FREE 64,) LIQD Place 30 mLs into feeding tube daily. Patient not taking: Reported on 08/29/2019 07/01/18   Orson Eva, MD  apixaban (ELIQUIS) 5 MG TABS tablet Take 5 mg by mouth 2 (two) times daily.    [provider]  atorvastatin (LIPITOR) 40 MG tablet Take 40 mg by mouth daily.    [provider]  bisacodyl (DULCOLAX) 10 MG suppository Place 10 mg rectally as needed for moderate constipation.    [provider]  carbidopa-levodopa (SINEMET IR) 25-100 MG tablet Take 1 tablet by mouth 3 (three) times daily.     [provider]  carvedilol (COREG) 3.125 MG tablet Take 1 tablet (3.125 mg total) by mouth 2 (two) times daily with a meal. 04/05/12   Nahser, Wonda Cheng, MD  cefixime (SUPRAX) 100 MG/5ML suspension Place 10 mLs (200 mg total) into feeding tube every 12 (twelve) hours. X 3 days Patient not taking: Reported on 08/29/2019 07/01/18   Orson Eva, MD  Cholecalciferol (VITAMIN D) 2000 UNITS tablet Take 2,000 Units by mouth daily.    [provider]  colchicine 0.6 MG tablet Take 0.6 mg by mouth daily.    [provider]  diphenhydrAMINE (BENADRYL) 25 mg capsule Take 25 mg by mouth every 4 (four) hours as needed for allergies.    [provider]  donepezil (ARICEPT) 5 MG tablet Take 5 mg by mouth at bedtime.    [provider]  DULoxetine (CYMBALTA) 20 MG capsule Take 20 mg by mouth 2 (two) times daily.    [provider]  famotidine (PEPCID) 20 MG tablet Take 20 mg by mouth as needed for heartburn or indigestion.    [provider]  ferrous sulfate 325 (65 FE) MG tablet Take 325 mg by mouth 2 (two) times daily with a meal.     [provider]  fluocinonide (LIDEX) 0.05 % external solution as needed. 02/16/19   [provider]  furosemide (LASIX) 40  MG tablet Take 40 mg by mouth 2 (two) times daily. Every 8 hr for swelling prn     [provider]  gabapentin (NEURONTIN) 100 MG capsule Take 100 mg by mouth 3 (three) times daily.    [provider]  guaifenesin (ROBITUSSIN) 100 MG/5ML syrup Take 200 mg by mouth as needed for cough. Take 37ml q6h as needed.    [provider]  hydrochlorothiazide (HYDRODIURIL) 12.5 MG tablet Take 12.5 mg by mouth daily.    [provider]  hydrocortisone 2.5 % cream Apply 1 application topically as needed (rash).    [provider]  ipratropium-albuterol (DUONEB) 0.5-2.5 (3) MG/3ML SOLN Take 3 mLs by nebulization every 6 (six) hours as needed (SHORTNESS OF BREATH).     [provider]  ketoconazole (NIZORAL) 2 % shampoo as needed. 04/26/19   [provider]  levETIRAcetam (KEPPRA) 500 MG tablet Take 500 mg by mouth 2 (two) times daily. 07/05/19   [provider]  LINZESS 145 MCG CAPS capsule Take 1 capsule by mouth daily. 02/06/19   [provider]  loratadine (CLARITIN) 10 MG tablet Take 10 mg by mouth daily.    [provider]  nitroGLYCERIN (NITROSTAT) 0.4 MG SL tablet Place 0.4 mg under the tongue every 5 (five) minutes as needed for chest pain.    [provider]  Nutritional Supplements (FEEDING SUPPLEMENT, OSMOLITE 1.5 CAL,) LIQD Infuse through PEG @ 60 cc/hr Patient not taking: Reported on 08/29/2019 06/30/18   Orson Eva, MD  NYSTATIN powder as needed. Arm pits, groin 05/09/19   [provider]  ondansetron (ZOFRAN) 4 MG tablet Take 4 mg by mouth every 8 (eight) hours as needed for nausea or vomiting.    [provider]  oxyCODONE (OXY  IR/ROXICODONE) 5 MG immediate release tablet Place 1 tablet (5 mg total) into feeding tube every 8 (eight) hours. 07/01/18   Orson Eva, MD  phosphorus (K PHOS NEUTRAL) [id number] MG tablet Place 2 tablets (500 mg total) into feeding tube 2 (two) times daily. Patient not taking: Reported on 08/29/2019 07/01/18   TatShanon Brow, MD  polyethylene glycol [facility] / Floria Raveling) packet Take 17 g by mouth 2 (two) times daily.     [provider]  potassium chloride (KLOR-CON) 20 MEQ packet Take 20 mEq by mouth 2 (two) times daily.    [provider]  Propylene Glycol (SYSTANE BALANCE) 0.6 % SOLN Apply 2 drops to eye at bedtime as needed (DRY EYES).    [provider]  QUEtiapine (SEROQUEL) 25 MG tablet Take 50 mg by mouth at bedtime.    [provider]  senna-docusate (SENOKOT-S) 8.6-50 MG per tablet Take 2 tablets by mouth 2 (two) times daily.     [provider]  vitamin B-12 (CYANOCOBALAMIN) 100 MCG tablet Take 100 mcg by mouth daily.    [provider]  vitamin C (ASCORBIC ACID) 500 MG tablet Take 500 mg by mouth 2 (two) times daily.    [provider]  Water For Irrigation, Sterile (FREE WATER) SOLN Place 200 mLs into feeding tube every 4 (four) hours. Patient not taking: Reported on 08/29/2019 06/30/18   Orson Eva, MD     Allergies:     Allergies  Allergen Reactions  . Keppra [Levetiracetam] Other (See Comments)    Increased agitation  . Ciprofloxacin Rash     Physical Exam:   Vitals  Blood pressure 103/69, pulse (!) 135, temperature (!) 102.1 F (38.9 C), temperature  source Rectal, resp. rate (!) 24, height 6\' 2"  (1.88 m), weight 102.8 kg, SpO2 98 %.  1.  General: Stupor, not responding to verbal or tactile stimuli  2. Psychiatric: Stuporous  3. Neurologic: Stupor, not responding to verbal or painful stimuli  4. HEENMT:  Atraumatic normocephalic  5. Respiratory : Clear to auscultation bilaterally, no wheezing or  crackles auscultated  6. Cardiovascular : S1-S2, regular, no murmur auscultated  7. Gastrointestinal:  Abdominal soft, nontender, no organomegaly      Data Review:    CBC Recent Labs  Lab 09/14/19 1551  WBC 3.5*  HGB 13.6  HCT 43.4  PLT 263  MCV 97.5  MCH 30.6  MCHC 31.3  RDW 14.6  LYMPHSABS 1.3  MONOABS 0.4  EOSABS 0.0  BASOSABS 0.0   ------------------------------------------------------------------------------------------------------------------  Results for orders placed or performed during the hospital encounter of 09/14/19 (from the past 48 hour(s))  CBC with Differential     Status: Abnormal   Collection Time: 09/14/19  3:51 PM  Result Value Ref Range   WBC 3.5 (L) 4.0 - 10.5 K/uL   RBC 4.45 4.22 - 5.81 MIL/uL   Hemoglobin 13.6 13.0 - 17.0 g/dL   HCT 43.4 39.0 - 52.0 %   MCV 97.5 80.0 - 100.0 fL   MCH 30.6 26.0 - 34.0 pg   MCHC 31.3 30.0 - 36.0 g/dL   RDW 14.6 11.5 - 15.5 %   Platelets 263 150 - 400 K/uL   nRBC 0.0 0.0 - 0.2 %   Neutrophils Relative % 49 %   Neutro Abs 1.7 1.7 - 7.7 K/uL   Lymphocytes Relative 37 %   Lymphs Abs 1.3 0.7 - 4.0 K/uL   Monocytes Relative 12 %   Monocytes Absolute 0.4 0.1 - 1.0 K/uL   Eosinophils Relative 1 %   Eosinophils Absolute 0.0 0.0 - 0.5 K/uL   Basophils Relative 0 %   Basophils Absolute 0.0 0.0 - 0.1 K/uL   Immature Granulocytes 1 %   Abs Immature Granulocytes 0.02 0.00 - 0.07 K/uL    Comment: Performed at [facility], [address], [place], [place] [address]  Comprehensive metabolic panel     Status: Abnormal   Collection Time: 09/14/19  3:51 PM  Result Value Ref Range   Sodium 147 (H) 135 - 145 mmol/L   Potassium 3.4 (L) 3.5 - 5.1 mmol/L   Chloride 105 98 - 111 mmol/L   CO2 31 22 - 32 mmol/L   Glucose, Bld 99 70 - 99 mg/dL   BUN 24 (H) 8 - 23 mg/dL   Creatinine, Ser 1.57 (H) 0.61 - 1.24 mg/dL   Calcium 8.7 (L) 8.9 - 10.3 mg/dL   Total Protein 7.8 6.5 - 8.1 g/dL   Albumin 3.5 3.5 - 5.0 g/dL   AST  21 15 - 41 U/L   ALT 5 0 - 44 U/L   Alkaline Phosphatase 88 38 - 126 U/L   Total Bilirubin 0.6 0.3 - 1.2 mg/dL   GFR calc non Af Amer 43 (L) >60 mL/min   GFR calc Af Amer 50 (L) >60 mL/min   Anion gap 11 5 - 15    Comment: Performed at [facility], [address], [place], [place] [address]  Urinalysis, Routine w reflex microscopic     Status: Abnormal   Collection Time: 09/14/19  5:35 PM  Result Value Ref Range   Color, Urine YELLOW YELLOW   APPearance HAZY (A) CLEAR   Specific Gravity, Urine 1.011  1.005 - 1.030   pH 6.0 5.0 - 8.0   Glucose, UA NEGATIVE NEGATIVE mg/dL   Hgb urine dipstick LARGE (A) NEGATIVE   Bilirubin Urine NEGATIVE NEGATIVE   Ketones, ur NEGATIVE NEGATIVE mg/dL   Protein, ur NEGATIVE NEGATIVE mg/dL   Nitrite POSITIVE (A) NEGATIVE   Leukocytes,Ua SMALL (A) NEGATIVE   RBC / HPF >50 (H) 0 - 5 RBC/hpf   WBC, UA 21-50 0 - 5 WBC/hpf   Bacteria, UA FEW (A) NONE SEEN   Mucus PRESENT     Comment: Performed at [facility], [address], [place], [place] [address]  Culture, blood (routine x 2)     Status: None (Preliminary result)   Collection Time: 09/14/19  8:48 PM   Specimen: Vein; Blood  Result Value Ref Range   Specimen Description BLOOD LEFT ARM    Special Requests      BOTTLES DRAWN AEROBIC AND ANAEROBIC Blood Culture adequate volume Performed at [facility], [address] [place], [place] [address]    Culture PENDING    Report Status PENDING     Chemistries  Recent Labs  Lab 09/14/19 1551  NA 147*  K 3.4*  CL 105  CO2 31  GLUCOSE 99  BUN 24*  CREATININE 1.57*  CALCIUM 8.7*  AST 21  ALT 5  ALKPHOS 88  BILITOT 0.6   ------------------------------------------------------------------------------------------------------------------  ------------------------------------------------------------------------------------------------------------------ GFR: Estimated Creatinine Clearance: 53.6 mL/min (A) (by C-G formula based on SCr of 1.57 mg/dL  (H)). Liver Function Tests: Recent Labs  Lab 09/14/19 1551  AST 21  ALT 5  ALKPHOS 88  BILITOT 0.6  PROT 7.8  ALBUMIN 3.5   No results for input(s): LIPASE, AMYLASE in the last 168 hours. No results for input(s): AMMONIA in the last 168 hours. Coagulation Profile: No results for input(s): INR, PROTIME in the last 168 hours. Cardiac Enzymes: No results for input(s): CKTOTAL, CKMB, CKMBINDEX, TROPONINI in the last 168 hours. BNP (last 3 results) No results for input(s): PROBNP in the last 8760 hours. HbA1C: No results for input(s): HGBA1C in the last 72 hours. CBG: No results for input(s): GLUCAP in the last 168 hours. Lipid Profile: No results for input(s): CHOL, HDL, LDLCALC, TRIG, CHOLHDL, LDLDIRECT in the last 72 hours. Thyroid Function Tests: No results for input(s): TSH, T4TOTAL, FREET4, T3FREE, THYROIDAB in the last 72 hours. Anemia Panel: No results for input(s): VITAMINB12, FOLATE, FERRITIN, TIBC, IRON, RETICCTPCT in the last 72 hours.  --------------------------------------------------------------------------------------------------------------- Urine analysis:    Component Value Date/Time   COLORURINE YELLOW 09/14/2019 1735   APPEARANCEUR HAZY (A) 09/14/2019 1735   LABSPEC 1.011 09/14/2019 1735   PHURINE 6.0 09/14/2019 1735   GLUCOSEU NEGATIVE 09/14/2019 1735   HGBUR LARGE (A) 09/14/2019 1735   BILIRUBINUR NEGATIVE 09/14/2019 1735   KETONESUR NEGATIVE 09/14/2019 1735   PROTEINUR NEGATIVE 09/14/2019 1735   UROBILINOGEN 0.2 12/26/2013 0817   NITRITE POSITIVE (A) 09/14/2019 1735   LEUKOCYTESUR SMALL (A) 09/14/2019 1735      Imaging Results:    DG Chest Portable 1 View  Result Date: 09/14/2019 CLINICAL DATA:  Altered mental status history of COVID positive EXAM: PORTABLE CHEST 1 VIEW COMPARISON:  06/25/2018 FINDINGS: Low lung volumes. Small foci of airspace disease in the right mid lung and left base. Mild cardiomegaly with aortic atherosclerosis. No  pneumothorax. IMPRESSION: Low lung volumes. Small foci of airspace disease at the left base and right mid lung, could reflect mild pneumonia. Electronically Signed   By: Madie Reno.D.  On: 09/14/2019 16:29    My personal review of EKG: Rhythm NSR, prolonged QT interval 519   Assessment & Plan:    Active Problems:   Altered mental status  1. Altered mental status-patient has baseline Parkinson disease with dementia, has minimal verbal interaction at baseline.  Currently is altered mental status, likely from underlying UTI as well as COVID-19 infection.  Patient started on IV Rocephin for UTI, will start remdesivir per pharmacy consultation, Decadron 6 mg IV every 24 hours.  2. COVID-19 infection-chest x-ray shows small foci of airspace disease at left base and right midlung which could reflect mild pneumonia.  Started on remdesivir and Decadron as above.  3. Sepsis due to UTI-patient has abnormal UA, urine culture has been obtained.  Started on IV Rocephin.  Follow urine culture results.  Patient is currently hypotensive, received 3 L fluid bolus in the ED.  Blood pressure is low normal.  Will avoid giving further fluid boluses as patient has COVID-19 pneumonia as above.  We will continue to monitor closely.  4. Parkinson disease with dementia-no behavior disturbance.  Patient is altered mental status as above.  Continue Sinemet through PEG tube.  5. Nutrition-patient gets his nutrition via PEG tube.  Continue with Osmolite 1.5 Cal at 60 mL/h.  Continue free water 200 mL via feeding tube every 4 hours.  6. Paroxysmal atrial fibrillation-continue Eliquis, will hold Coreg due to hypotension.  7. History of seizures-continue Keppra via feeding tube.  8. Prolonged QTc interval-we will check serum magnesium level.     DVT Prophylaxis- Eliquis  AM Labs Ordered, also please review Full Orders  Family Communication: Admission, patients condition and plan of care including tests being  ordered have been discussed with the patients wife at bedside, who indicate understanding and agree with the plan and Code Status.  Code Status: Partial code  Admission status: Inpatient: Based on patients clinical presentation and evaluation of above clinical data, I have made determination that patient meets Inpatient criteria at this time.  Time spent in minutes : 60 minutes   Trevelle Mcgurn S Marijean Montanye M.D

## 2019-09-15 NOTE — ED Notes (Signed)
Room assigned  Awaiting room ready   Family at bedside

## 2019-09-15 NOTE — ED Notes (Signed)
Pt is not responding.  Sats 84-85% on O2@5  L/M  Increased to 6L/m.  MD paged.

## 2019-09-15 NOTE — ED Notes (Signed)
In pt room multiple times due to IV pump IV site issues,   Multiple meds late   Pt awaiting an ICU bed assignment from bed control

## 2019-09-15 NOTE — ED Notes (Signed)
Pt spouse reports she believes her husband would want chest compressions and shock if needed   Pt is a DNR  Dr Ashok Cordia in to speak with and Dr Jerilynn Mages, hospitalist contacted and will come and speak with

## 2019-09-15 NOTE — ED Notes (Signed)
Date and time results received: 09/15/19 0443  Test: Lactic acid Critical Value: 3.5  Name of Provider Notified: [place]  Orders Received? Or Actions Taken?: na

## 2019-09-15 NOTE — ED Notes (Signed)
Date and time results received: 09/15/19 0441  Test: potassium Critical Value: 2.6  Name of Provider Notified: [place]  Orders Received? Or Actions Taken?: na

## 2019-09-15 NOTE — ED Notes (Signed)
CRITICAL VALUE ALERT  Critical Value:  Lactic Acid 3.6  Date & Time Notied:  09/15/19 0806  Provider Notified: Dr. Dyann Kief  Orders Received/Actions taken: MD notified

## 2019-09-15 NOTE — ED Notes (Signed)
Unable to chart 0700 meds not given due to locked by pharm

## 2019-09-15 NOTE — Progress Notes (Signed)
Pharmacy Antibiotic Note  Mark [name] is a 74 y.o. male admitted on 09/14/2019 with COVID/UTI/ bacteremia.  Pharmacy has been consulted for Meropenem dosing.  Plan: Meropenem 1000 mg IV every 12 hours. Monitor labs, c/s, and patient improvement.  Height: 6\' 2"  (188 cm) Weight: 226 lb 11.2 oz (102.8 kg) IBW/kg (Calculated) : 82.2  Temp (24hrs), Avg:100.6 F (38.1 C), Min:98.9 F (37.2 C), Max:102.4 F (39.1 C)  Recent Labs  Lab 09/14/19 1551 09/15/19 0350 09/15/19 0711  WBC 3.5* 4.7  --   CREATININE 1.57* 2.29*  --   LATICACIDVEN  --  3.5* 3.6*    Estimated Creatinine Clearance: 36.7 mL/min (A) (by C-G formula based on SCr of 2.29 mg/dL (H)).    Allergies  Allergen Reactions  . Keppra [Levetiracetam] Other (See Comments)    Increased agitation  . Ciprofloxacin Rash    Antimicrobials this admission: Merrem 1/8 >>  CTX 1/7>>1/8  Dose adjustments this admission: [place]  Microbiology results: 1/7 BCx: gram neg rods 1/2 1/7 UCx: pending    Thank you for allowing pharmacy to be a part of this patient's care.  Margot Ables, PharmD Clinical Pharmacist 09/15/2019 10:52 AM

## 2019-09-15 NOTE — ED Notes (Signed)
CRITICAL VALUE ALERT  Critical Value:  Blood culture Anaerobic gram negative rod  Date & Time Notied:  09/15/2019 @ [id number]  Provider Notified: Dr Dyann Kief  Orders Received/Actions taken: see MD orders.

## 2019-09-15 NOTE — ED Notes (Signed)
Call to pharm

## 2019-09-15 NOTE — ED Notes (Signed)
Concha Se, RN, SWAT in room

## 2019-09-15 NOTE — ED Notes (Signed)
pts O2 91% on 4L, pt bumped up to 5L per [place], MD made aware

## 2019-09-15 NOTE — ED Notes (Signed)
In to complete admission screenings with pt and family. Blood pressure readings elevated (200s/100s) per monitor for last 4-5 readings. Levophed paused when BP verified by RN. Pt found to be hot to touch and with soiled bed pads/linens. Pt incontinent of urine and stool. Patient cleansed and full linen changed. Rectal temp obtained and elevated at 102.5 Noted large sacral skin breakdown, blanching but with multiple skin tears throughout. Foam pad applied after patient cleansed of stool/urine. Pt tolerated procedure fairly well, but unable to assist with any activity. Pt oxygen sats better on his side, able to clear secretions and have stronger cough. Pt repositioned in stretcher, repeat BP after activity in the [id number] systolic (30 post turning levophed off). Levophed restarted at 1735 per orders at 4 mcg/min, half the dose pt was on when stopped. Pt family remains at bedside and active in pt care.

## 2019-09-15 NOTE — ED Notes (Signed)
Awaiting bed assignment.

## 2019-09-15 NOTE — ED Notes (Signed)
EDP in room and Dr Dyann Kief on the way.  Increased to 15 L/M, resp on the way.

## 2019-09-15 NOTE — Progress Notes (Addendum)
Patient seen and examined. Admitted ater midnight secondary to Severe sepsis with septic shock in the setting of COVID-19 infection and UTI. IVF's bolus per sepsis protocol given and Norepinephrine started to support BP. resp status has worsen and patient requiring non-rebrather currently. Please refer to H&P written by Dr. Darrick Meigs for further info/details on admission.  Plan: -patient is DNR/DNI, code status reviewed and confirmed with wife at bedside. -gray/guarded prognosis currently, but will aim to treat what is treatable, but no invasive/heroic interventions. -continue IV steroids and IV remdesivir. -continue low dose pressor and IVF's. -lactic acid elevated in the setting of sepsis. -continue IV antibiotics (changing to meropenem in the setting of GNR bacteremia component) and follow urine/blood culture final results. -holding antihypertensive agents in the setting of septic shock. -DVT prophylaxis with Eliquis. -after reviewing and discussing case with Dr. Lake Bells and Dr. Elsworth Soho  admit at ICU [facility] when bed available.  Barton Dubois MD [phone]

## 2019-09-15 NOTE — ED Notes (Signed)
Dr Ashok Cordia in to speak with spouse

## 2019-09-15 NOTE — ED Notes (Signed)
Pt continues to await room assignment   spouse is with pt

## 2019-09-15 NOTE — Consult Note (Signed)
PULMONARY / CRITICAL CARE MEDICINE   NAME:  Mark [name], MRN:  [id number], DOB:  14-Sep-1945, LOS: 0 ADMISSION DATE:  09/14/2019, CONSULTATION DATE:  09/15/2019  REFERRING MD:  Dyann Kief ,MD, CHIEF COMPLAINT: Septic shock  BRIEF HISTORY:    74 year old man with Parkinson's admitted with GNR septic shock and acute hypoxic respiratory failure due to Covid pneumonia  HISTORY OF PRESENT ILLNESS   74 year old man with advanced Parkinson's, dementia, PEG seizure disorder, NHR admitted due to decreased responsiveness and failure to thrive.  Noted to be hypotensive in the ED, lactate of 3.5, given 3 L IV fluids, then started on Levophed. Of note he was diagnosed with Covid infection on 1/3 On arrival he only required 2 to 4 L of oxygen but subsequently was placed on nonrebreather  SIGNIFICANT PAST MEDICAL HISTORY   Advanced Parkinson's disease with dementia and PEG CKD stage III Seizure disorder Atrial fibrillation Hypertension  SIGNIFICANT EVENTS:  1/3 Covid infection diagnosed  STUDIES:   11/2017 MRI brain chronic small vessel disease and cerebral atrophy  CULTURES:  Blood 1/8 >> GNR >> Urine 1/8  ANTIBIOTICS:  ceftx 1/8 >>  LINES/TUBES:    CONSULTANTS:   SUBJECTIVE:  Hypotensive on Levophed drip On nonrebreather  CONSTITUTIONAL: BP (!) 50/35   Pulse 69   Temp (!) 100.8 F (38.2 C) (Rectal)   Resp (!) 26   Ht 6\' 2"  (1.88 m)   Wt 102.8 kg   SpO2 (!) 68%   BMI 29.11 kg/m   I/O last 3 completed shifts: In: 2100 [IV Piggyback:2100] Out: -         PHYSICAL EXAM: General: Elderly, unresponsive, on nonrebreather, no distress Neuro: Unresponsive, stiff neck, rigidity all 4 extremities HEENT: Mild pallor, no icterus, no JVD Cardiovascular: S1-S2 normal Lungs: Decreased breath sounds bilateral, upper airway series Abdomen: Soft protuberant, PEG site clean Musculoskeletal: No deformity, cogwheel rigidity Skin: No rash  Chest x-ray 1/7 personally reviewed shows  airspace disease left base and right midlung, low lung volumes  Labs show hypokalemia, increased creatinine from 1.5-2.3, lactate unchanged, no leukocytosis  RESOLVED PROBLEM LIST   ASSESSMENT AND PLAN   Hypotension appears to be due to GNR septic shock likely from UTI Hypoxia could be related to worsening Covid pneumonia Noted goals of care discussion with the hospitalist team  Septic shock-unfortunately lactate nonclearance is a poor prognostic sign Continue ceftriaxone Continue Levophed peripherally  Acute hypoxic respiratory failure due to Covid pneumonia-continue nonrebreather -Remdesivir and Decadron  AKI -continue IV fluids 125/hour Avoid nephrotoxins Apixaban should likely be changed to IV heparin if renal function worsens IV heparin   Per goals of care discussion, Levophed can be kept at 10 mics.  Do not place central line.  No intubation, no CPR/cardioversion  SUMMARY OF TODAY'S PLAN:  Continue antibiotics for urosepsis and pressors at current rate and see if he responds Updated wife at bedside PCCM will be available as needed Will need admission to ICU bed due to pressors, hopefully we can find a bed at [facility] for him rather than transfer him-this would be better for the family too.  If he comes off pressors, then he can be admitted to progressive care  Best Practice / Goals of Care / Disposition.   DVT PROPHYLAXIS: Apixaban SUP:n/a NUTRITION: N.p.o. MOBILITY: Bedrest GOALS OF CARE: Limited CODE STATUS as above FAMILY DISCUSSIONS: Per [facility]  LABS  Glucose No results for input(s): GLUCAP in the last 168 hours.  BMET Recent Labs  Lab 09/14/19  1551 09/15/19 0350  NA 147* 145  K 3.4* 2.6*  CL 105 112*  CO2 31 19*  BUN 24* 27*  CREATININE 1.57* 2.29*  GLUCOSE 99 115*    Liver Enzymes Recent Labs  Lab 09/14/19 1551 09/15/19 0350  AST 21 48*  ALT 5 21  ALKPHOS 88 97  BILITOT 0.6 1.7*  ALBUMIN 3.5 2.8*     Electrolytes Recent Labs  Lab 09/14/19 1551 09/15/19 0350  CALCIUM 8.7* 7.4*  MG  --  1.5*    CBC Recent Labs  Lab 09/14/19 1551 09/15/19 0350  WBC 3.5* 4.7  HGB 13.6 11.9*  HCT 43.4 39.0  PLT 263 170    ABG No results for input(s): PHART, PCO2ART, PO2ART in the last 168 hours.  Coag's No results for input(s): APTT, INR in the last 168 hours.  Sepsis Markers Recent Labs  Lab 09/15/19 0350 09/15/19 0711  LATICACIDVEN 3.5* 3.6*    Cardiac Enzymes No results for input(s): TROPONINI, PROBNP in the last 168 hours.  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  PAST SURGICAL HISTORY:  He  has a past surgical history that includes transthoracic echocardiogram (06/26/2011); ORIF elbow fracture (12/29/2011); Hardware Removal (12/29/2011); Parotidectomy (05/12/2012); Cardiac catheterization (06/26/2011); Coronary angioplasty with stent (~ 2000); Skin cancer excision (2013); Parotidectomy (05/12/2012); Skin cancer excision; PEG placement (N/A, 06/29/2018); and Esophagogastroduodenoscopy (egd) with propofol (N/A, 06/29/2018).  Allergies  Allergen Reactions  . Keppra [Levetiracetam] Other (See Comments)    Increased agitation  . Rocephin [Ceftriaxone]     Listed on current MAR from [place]  . Ciprofloxacin Rash    No current facility-administered medications on file prior to encounter.   Current Outpatient Medications on File Prior to Encounter  Medication Sig   . acetaminophen (TYLENOL) 325 MG tablet Take 650 mg by mouth every 6 (six) hours as needed for fever.  Marland Kitchen apixaban (ELIQUIS) 5 MG TABS tablet Take 5 mg by mouth 2 (two) times daily.  Marland Kitchen atorvastatin (LIPITOR) 40 MG tablet Take 40 mg by mouth daily.  . bisacodyl (DULCOLAX) 10 MG suppository Place 10 mg rectally daily as needed for mild constipation or moderate constipation.   . carbidopa-levodopa (SINEMET IR) 25-100 MG tablet Take 1 tablet by mouth every 8 (eight) hours.   . carvedilol (COREG) 3.125 MG tablet Take 1 tablet (3.125 mg total) by mouth 2 (two) times daily with a meal.  . Cholecalciferol (VITAMIN D) 2000 UNITS tablet Take 2,000 Units by mouth daily.  . colchicine 0.6 MG tablet Take 0.6 mg by mouth daily.  Marland Kitchen donepezil (ARICEPT) 5 MG tablet Take 5 mg by mouth at bedtime.  . famotidine (PEPCID) 20 MG tablet Take 20 mg by mouth daily.   . ferrous sulfate 325 (65 FE) MG tablet Take 325 mg by mouth 2 (two) times daily with a meal.   . fluocinonide (LIDEX) 0.05 % external solution Apply 1 application topically daily as needed (applied to scalp as needed for itching and scaling/until clear).   . furosemide (LASIX) 40 MG tablet Take 40 mg by mouth 2 (two) times daily. Every 8 hr for swelling prn   .  gabapentin (NEURONTIN) 100 MG capsule Take 100 mg by mouth every 8 (eight) hours.   Marland Kitchen guaifenesin (HUMIBID E) 400 MG TABS tablet Take 400 mg by mouth at bedtime. *May take up to twice daily as needed for cough wuth 8 hours in between doses.  . hydrochlorothiazide (HYDRODIURIL) 12.5 MG tablet Take 12.5 mg by mouth daily.  Marland Kitchen ipratropium-albuterol (DUONEB) 0.5-2.5 (3) MG/3ML SOLN Take 3 mLs by nebulization every 6 (six) hours as needed (SHORTNESS OF BREATH).   Marland Kitchen ketoconazole (NIZORAL) 2 % shampoo Apply 1 application topically daily.   Marland Kitchen levETIRAcetam (KEPPRA) 500 MG tablet Take 500 mg by mouth 2 (two) times daily.  Marland Kitchen LINZESS 145 MCG CAPS capsule Take 1 capsule by mouth daily.  Marland Kitchen loratadine (CLARITIN)  10 MG tablet Take 10 mg by mouth daily.  . nitroGLYCERIN (NITROSTAT) 0.4 MG SL tablet Place 0.4 mg under the tongue every 5 (five) minutes as needed for chest pain.  Marland Kitchen NYSTATIN powder Apply 1 application topically See admin instructions. Apply twice daily to Arm pit and groin area  . ondansetron (ZOFRAN) 4 MG tablet Take 4 mg by mouth every 8 (eight) hours as needed for nausea or vomiting.  Marland Kitchen oxyCODONE (OXY IR/ROXICODONE) 5 MG immediate release tablet Place 1 tablet (5 mg total) into feeding tube every 8 (eight) hours. (Patient taking differently: Take 5 mg by mouth 2 (two) times daily. )  . OXYGEN Inhale 2 L into the lungs continuous.  . phosphorus (K PHOS NEUTRAL) 155-852-130 MG tablet Place 2 tablets (500 mg total) into feeding tube 2 (two) times daily.  . potassium chloride (KLOR-CON) 20 MEQ packet Take 20 mEq by mouth 2 (two) times daily.  Marland Kitchen Propylene Glycol (SYSTANE BALANCE) 0.6 % SOLN Apply 2 drops to eye at bedtime.   Marland Kitchen Propylene Glycol (SYSTANE BALANCE) 0.6 % SOLN Apply 1 drop to eye 2 (two) times daily.  . QUEtiapine (SEROQUEL) 25 MG tablet Take 25 mg by mouth at bedtime.   . senna-docusate (SENOKOT-S) 8.6-50 MG per tablet Take 1 tablet by mouth 2 (two) times daily.   . vitamin B-12 (CYANOCOBALAMIN) 100 MCG tablet Take 100 mcg by mouth daily.  . vitamin C (ASCORBIC ACID) 500 MG tablet Take 500 mg by mouth 2 (two) times daily.    FAMILY HISTORY:   His family history includes Arthritis in an other family member; Heart attack in his father; Heart disease in an other family member. There is no history of Colon cancer or Liver disease.  SOCIAL HISTORY:  He  reports that he has never smoked. He has never used smokeless tobacco. He reports that he does not drink alcohol or use drugs.  REVIEW OF SYSTEMS:    Unable to obtain due to altered sensorium  Kara Mead MD. FCCP. [facility]  If no response to pager , please call [phone]   09/15/2019

## 2019-09-15 NOTE — ED Notes (Signed)
Levophed stopped after BP verification.

## 2019-09-15 NOTE — ED Notes (Signed)
Dr Dyann Kief at bedside.  Resp in to place pt on HF 15 liters.

## 2019-09-15 NOTE — ED Notes (Signed)
Report to Ashley, RN

## 2019-09-15 NOTE — ED Notes (Signed)
Spousal support  Encouraged to call daughter and ask if she would like to come as pt is now having periods of apnea   Registration called to allow pt daughter back with her mother

## 2019-09-15 NOTE — ED Notes (Signed)
Restarted Levophed at this time.

## 2019-09-15 NOTE — ED Notes (Signed)
Pt spouse at bedside  Pt from [place]  NS decreased due to vein unable to withstand pressure of meds and NS

## 2019-09-16 ENCOUNTER — Inpatient Hospital Stay: Payer: Self-pay

## 2019-09-16 LAB — CBC WITH DIFFERENTIAL/PLATELET
Abs Immature Granulocytes: 4.5 10*3/uL — ABNORMAL HIGH (ref 0.00–0.07)
Basophils Absolute: 0 10*3/uL (ref 0.0–0.1)
Basophils Relative: 0 %
Eosinophils Absolute: 0.2 10*3/uL (ref 0.0–0.5)
Eosinophils Relative: 1 %
HCT: 42.7 % (ref 39.0–52.0)
Hemoglobin: 13.8 g/dL (ref 13.0–17.0)
Immature Granulocytes: 13 %
Lymphocytes Relative: 3 %
Lymphs Abs: 1.1 10*3/uL (ref 0.7–4.0)
MCH: 30.7 pg (ref 26.0–34.0)
MCHC: 32.3 g/dL (ref 30.0–36.0)
MCV: 94.9 fL (ref 80.0–100.0)
Monocytes Absolute: 1.3 10*3/uL — ABNORMAL HIGH (ref 0.1–1.0)
Monocytes Relative: 4 %
Neutro Abs: 28.8 10*3/uL — ABNORMAL HIGH (ref 1.7–7.7)
Neutrophils Relative %: 79 %
Platelets: 108 10*3/uL — ABNORMAL LOW (ref 150–400)
RBC: 4.5 MIL/uL (ref 4.22–5.81)
RDW: 15.3 % (ref 11.5–15.5)
WBC Morphology: INCREASED
WBC: 36 10*3/uL — ABNORMAL HIGH (ref 4.0–10.5)
nRBC: 0 % (ref 0.0–0.2)

## 2019-09-16 LAB — COMPREHENSIVE METABOLIC PANEL
ALT: 24 U/L (ref 0–44)
AST: 75 U/L — ABNORMAL HIGH (ref 15–41)
Albumin: 2.7 g/dL — ABNORMAL LOW (ref 3.5–5.0)
Alkaline Phosphatase: 100 U/L (ref 38–126)
Anion gap: 12 (ref 5–15)
BUN: 53 mg/dL — ABNORMAL HIGH (ref 8–23)
CO2: 19 mmol/L — ABNORMAL LOW (ref 22–32)
Calcium: 7.3 mg/dL — ABNORMAL LOW (ref 8.9–10.3)
Chloride: 115 mmol/L — ABNORMAL HIGH (ref 98–111)
Creatinine, Ser: 2.34 mg/dL — ABNORMAL HIGH (ref 0.61–1.24)
GFR calc Af Amer: 31 mL/min — ABNORMAL LOW (ref >60)
GFR calc non Af Amer: 27 mL/min — ABNORMAL LOW (ref >60)
Glucose, Bld: 128 mg/dL — ABNORMAL HIGH (ref 70–99)
Potassium: 3.5 mmol/L (ref 3.5–5.1)
Sodium: 146 mmol/L — ABNORMAL HIGH (ref 135–145)
Total Bilirubin: 1 mg/dL (ref 0.3–1.2)
Total Protein: 6.4 g/dL — ABNORMAL LOW (ref 6.5–8.1)

## 2019-09-16 LAB — C-REACTIVE PROTEIN: CRP: 29.7 mg/dL — ABNORMAL HIGH (ref <1.0)

## 2019-09-16 LAB — MRSA PCR SCREENING: MRSA by PCR: NEGATIVE

## 2019-09-16 LAB — FERRITIN: Ferritin: 3069 ng/mL — ABNORMAL HIGH (ref 24–336)

## 2019-09-16 LAB — LACTIC ACID, PLASMA: Lactic Acid, Venous: 2.3 mmol/L (ref 0.5–1.9)

## 2019-09-16 LAB — D-DIMER, QUANTITATIVE: D-Dimer, Quant: 8.41 ug/mL-FEU — ABNORMAL HIGH (ref 0.00–0.50)

## 2019-09-16 MED ORDER — CHLORHEXIDINE GLUCONATE 0.12 % MT SOLN
15.0000 mL | Freq: Two times a day (BID) | OROMUCOSAL | Status: DC
  Administered 2019-09-16 – 2019-09-21 (×9): 15 mL via OROMUCOSAL

## 2019-09-16 MED ORDER — ORAL CARE MOUTH RINSE
15.0000 mL | Freq: Two times a day (BID) | OROMUCOSAL | Status: DC
  Administered 2019-09-16 – 2019-09-20 (×9): 15 mL via OROMUCOSAL

## 2019-09-16 MED ORDER — SODIUM CHLORIDE 0.45 % IV SOLN: INTRAVENOUS | Status: DC

## 2019-09-16 NOTE — Progress Notes (Signed)
Pt's O2 sat decreasing to 85% at time while on 4L [place], this RN went into room to assess pt. Rhonchi noted, attempted oral suction with no result. Sat rose to 94% on 4L [place] when readjusted in the bed.

## 2019-09-16 NOTE — Progress Notes (Signed)
Lab unable to obtain specimen, this RN to attempt. Upon entering room L AC IV is infiltrated. Removed and applied compression. Unable to obtain labs or another IV. Pt has one IV remaining dedicated to Levo. MD [facility] notified.

## 2019-09-16 NOTE — Progress Notes (Signed)
PICC RN at bedside at this time.

## 2019-09-16 NOTE — Progress Notes (Signed)
CRITICAL VALUE ALERT  Critical Value:  Lactic Acid 2.3  Date & Time Notied:  09/16/2019 1800  Provider Notified: [place]  Orders Received/Actions taken: paged admitting, see orders

## 2019-09-16 NOTE — Progress Notes (Signed)
[facility] called for PICC placement, notified them of COVID status. States ETA around 1400.

## 2019-09-16 NOTE — Progress Notes (Signed)
During initial assessment of patient his temp was 103.6 covers were pulled back from patient, ice packs applied, and rectal tylenol was given. At midnight temp was checked again and was still 102.8. Paged Dr. Darrick Meigs and received orders for cooling blanket. Blanket applied at [facility]. Current temp is 102.6 rectal.

## 2019-09-16 NOTE — Progress Notes (Signed)
Spoke with ICU RN re PICC- has notified [facility]

## 2019-09-16 NOTE — Progress Notes (Addendum)
PROGRESS NOTE    Mark [name]  [id number] DOB: Sep 17, 1945 DOA: 09/14/2019 PCP: Caprice Renshaw, MD     Brief Narrative:  74 y.o. male, with history of hypertension, CAD, paroxysmal atrial fibrillation, advanced Parkinson disease, dementia, paroxysmal atrial fibrillation on apixaban, hypertension, CKD stage III, history of seizures who was diagnosed with COVID-19 infection on 09/10/2019.  Presented from nursing home with 2-day history of reduced responsiveness and poor p.o. intake.  Patient gets most of his nutrition and fluid through the feeding tube.  There is no history of nausea vomiting or diarrhea.  He does have history of frequent UTIs.  Patient has been less verbal as per patient's wife.  Patient usually not able to carry conversation at baseline. In the ED patient was found to have UTI, and was going to be discharged home.  Patient became hypotensive in the ED, he was given Rocephin and IV fluid boluses.  Currently he is requiring 4 L/min of oxygen.  He is unresponsive and unable to provide any history.    Assessment & Plan: 1-acute resp failure with hypoxia due to COVID PNA -requiring non-rebreathr initially -down to 4-5L currently -will continue remdesivir and IV steroids -continue supportive care -follow inflammatory markers  2-severe sepsis with septic shock -due to E, coli UTI/bactermia -follow final culture results -continue current IV antibiotics -continue IVF's -patient with resp distress and renal failure as organ dysfunctions   3-lactic acidosis -due to sepsis -will follow level after IVF's given  4-chronic paroxysmal a. 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 -in the setting of poor perfusion/hypotension with sepsis and shock -continue holding and minimizing nephrotoxic agents -follow renal function trend    6-Altered mental status -in the setting of metabolic encephalopathy -patient with underlying hx of dementia and parkinson;s -continue constant reorientation -non verbal at baseline  7-parkinson's dementia -continue sinemet -patient meds and nutrition given through PEG  8-hx of seizure -will continue keppra  9-DNR -patient DNR/DNI prior to admission -long discussion about prognosis and limited response so far with family -will continue to treat what is treatable -Palliative care consulted for advance directives. -poor prognosis and if hospital death occurred, not a surprise.  10-stage 3 sacrum pressure injury -continue preventive measures -POA -patient bed-bound at baseline   DVT prophylaxis: on apixaban Code Status: DNR/DNI Family Communication: wife over the phone Disposition Plan: remains in ICU, continue IV sterodis, IV remdesivir and IV antibiotics. After discussion with wife and in limitation to IV access, Order for PICC line in place; once available will check CVP and repeat Labs to evaluate further response. Will also continue for now Levophed with potential further titration if needed with appropriate access. Prognosis is very poor.  Consultants:   Palliative care  intensivist curbside (Dr. Elsworth Soho) on 09/15/19; continue current management.   Procedures:   See below for x-ray reports.   Antimicrobials:  Anti-infectives (From admission, onward)   Start     Dose/Rate Route Frequency Ordered Stop   09/16/19 1000  remdesivir 100 mg in sodium chloride 0.9 % 100 mL IVPB  Status:  Discontinued     100 mg 200 mL/hr over 30 Minutes Intravenous Daily 09/15/19 0054 09/15/19 0059   09/16/19 1000  remdesivir 100 mg in sodium chloride 0.9 % 100 mL IVPB     100 mg 200 mL/hr over 30 Minutes Intravenous Daily 09/15/19 0022 09/20/19 0959  09/15/19 2200  cefTRIAXone (ROCEPHIN) 1 g in sodium chloride 0.9 % 100 mL IVPB  Status:  Discontinued     1 g 200 mL/hr over 30 Minutes  Intravenous Every 24 hours 09/15/19 0054 09/15/19 1010   09/15/19 1100  meropenem (MERREM) 1 g in sodium chloride 0.9 % 100 mL IVPB     1 g 200 mL/hr over 30 Minutes Intravenous Every 12 hours 09/15/19 1049     09/15/19 0100  remdesivir 200 mg in sodium chloride 0.9% 250 mL IVPB     200 mg 580 mL/hr over 30 Minutes Intravenous Once 09/15/19 0022 09/15/19 0245   09/15/19 0054  remdesivir 200 mg in sodium chloride 0.9% 250 mL IVPB  Status:  Discontinued     200 mg 580 mL/hr over 30 Minutes Intravenous Once 09/15/19 0054 09/15/19 0059   09/14/19 1900  cefTRIAXone (ROCEPHIN) 1 g in sodium chloride 0.9 % 100 mL IVPB     1 g 200 mL/hr over 30 Minutes Intravenous  Once 09/14/19 1858 09/14/19 2142      Subjective: Still intermittently spiking fever, non verbal, no tracking and no following commands. Patient depending on levophed to maintain BP and difficult to stick and currently with limited IV access.   Objective: Vitals:   09/16/19 0804 09/16/19 0945 09/16/19 1000 09/16/19 1115  BP: (!) 151/77 (!) 79/54 118/78 (!) 93/59  Pulse: (!) 124     Resp: (!) 25 (!) 24 (!) 45 (!) 26  Temp: (!) 97.3 F (36.3 C)     TempSrc: Rectal     SpO2: 92%     Weight:      Height:        Intake/Output Summary (Last 24 hours) at 09/16/2019 1123 Last data filed at 09/16/2019 [id number] Gross per 24 hour  Intake 1979.99 ml  Output 600 ml  Net 1379.99 ml   Filed Weights   09/14/19 1434 09/15/19 1851  Weight: 102.8 kg 107.2 kg    Examination: General exam: [place], but non-verbal and not tracking; patient with dementia, parkinson's and bed-bound at baseline. Currently on 4-5L [place]. Respiratory system: decrease BS at the bases, posiitve rhonchi, positive tachypnea nd with increase gargling on his upper airways. Cardiovascular system: positive tachycardia, irregular, no rubs or gallops. Telemetry reviewed and positive A. Fib appreciated. Gastrointestinal system: Abdomen is nondistended, soft and nontender. Positive  BS, PEG tube in place. Central nervous system: non verbal and bed-bound at baseline; not following commands on exam. Patient will blink in response to eyes touch.  Extremities: No cyanosis, no clubbing.  Skin: No petechiae, no open wounds appreciated; stage 3 pressure injury sacrum area (POA).  Psychiatry: Judgement and insight unable to assess with non-verbal/non responsive status.   Data Reviewed: I have personally reviewed following labs and imaging studies  CBC: Recent Labs  Lab 09/14/19 1551 09/15/19 0350  WBC 3.5* 4.7  NEUTROABS 1.7 4.2  HGB 13.6 11.9*  HCT 43.4 39.0  MCV 97.5 97.5  PLT 263 [id number]   Basic Metabolic Panel: Recent Labs  Lab 09/14/19 1551 09/15/19 0350  NA 147* 145  K 3.4* 2.6*  CL 105 112*  CO2 31 19*  GLUCOSE 99 115*  BUN 24* 27*  CREATININE 1.57* 2.29*  CALCIUM 8.7* 7.4*  MG  --  1.5*   GFR: Estimated Creatinine Clearance: 37.5 mL/min (A) (by C-G formula based on SCr of 2.29 mg/dL (H)).   Liver Function Tests: Recent Labs  Lab 09/14/19 1551 09/15/19 0350  AST 21 48*  ALT 5 21  ALKPHOS 88 97  BILITOT 0.6 1.7*  PROT 7.8 6.1*  ALBUMIN 3.5 2.8*     Anemia Panel: Recent Labs    09/14/19 2048 09/15/19 0350  FERRITIN 386* 1,780*   Urine analysis:    Component Value Date/Time   COLORURINE YELLOW 09/14/2019 1735   APPEARANCEUR HAZY (A) 09/14/2019 1735   LABSPEC 1.011 09/14/2019 1735   PHURINE 6.0 09/14/2019 1735   GLUCOSEU NEGATIVE 09/14/2019 1735   HGBUR LARGE (A) 09/14/2019 1735   BILIRUBINUR NEGATIVE 09/14/2019 1735   KETONESUR NEGATIVE 09/14/2019 1735   PROTEINUR NEGATIVE 09/14/2019 1735   UROBILINOGEN 0.2 12/26/2013 0817   NITRITE POSITIVE (A) 09/14/2019 1735   LEUKOCYTESUR SMALL (A) 09/14/2019 1735    Recent Results (from the past 240 hour(s))  Urine culture     Status: Abnormal (Preliminary result)   Collection Time: 09/14/19  5:35 PM   Specimen: Urine, Random  Result Value Ref Range Status   Specimen Description    Final    URINE, RANDOM Performed at [facility], [address] [place], [place] [address]    Special Requests   Final    NONE Performed at [facility], [address], [place], [place] [address]    Culture (A)  Final    >=100,000 COLONIES/mL GRAM NEGATIVE RODS IDENTIFICATION AND SUSCEPTIBILITIES TO FOLLOW Performed at [facility], [place] [address], [place], [place] [address]    Report Status PENDING  Incomplete  Culture, blood (routine x 2)     Status: None (Preliminary result)   Collection Time: 09/14/19  8:48 PM   Specimen: BLOOD LEFT ARM  Result Value Ref Range Status   Specimen Description   Final    BLOOD LEFT ARM Performed at [facility], [address] [place], [place] [address]    Special Requests   Final    BOTTLES DRAWN AEROBIC AND ANAEROBIC Blood Culture adequate volume Performed at [place] [place], [place] [address]    Culture  Setup Time   Final    GRAM NEGATIVE RODS IN BOTH AEROBIC AND ANAEROBIC BOTTLES Gram Stain Report Called to,Read Back By and Verified With: MARTIN D. AT [id number] ON [id number] BY THOMPSON S. CRITICAL RESULT CALLED TO, READ BACK BY AND VERIFIED WITH: G. Coffee PharmD 15:35 09/15/19 ([email or web address])    Culture   Final    GRAM NEGATIVE RODS TOO YOUNG TO READ Performed at [facility], [place] [address] [place],  [address]    Report Status PENDING  Incomplete  Blood Culture ID Panel (Reflexed)     Status: Abnormal   Collection Time: 09/14/19  8:48 PM  Result Value Ref Range Status   Enterococcus species NOT DETECTED NOT DETECTED Final   Listeria monocytogenes NOT DETECTED NOT DETECTED Final   Staphylococcus species NOT DETECTED NOT DETECTED Final   Staphylococcus aureus (BCID) NOT DETECTED NOT DETECTED Final   Streptococcus species NOT DETECTED NOT DETECTED Final   Streptococcus agalactiae NOT DETECTED NOT DETECTED Final   Streptococcus pneumoniae NOT DETECTED NOT DETECTED Final   Streptococcus pyogenes NOT  DETECTED NOT DETECTED Final   Acinetobacter baumannii NOT DETECTED NOT DETECTED Final   Enterobacteriaceae species DETECTED (A) NOT DETECTED Final    Comment: Enterobacteriaceae represent a large family of gram-negative bacteria, not a single organism. CRITICAL RESULT CALLED TO, READ BACK BY AND VERIFIED WITH: G. Coffee PharmD 15:35 09/15/19 ([email or web address])    Enterobacter cloacae complex NOT DETECTED NOT DETECTED Final   Escherichia  coli DETECTED (A) NOT DETECTED Final    Comment: CRITICAL RESULT CALLED TO, READ BACK BY AND VERIFIED WITH: G. Coffee PharmD 15:35 09/15/19 (wilsonm)    Klebsiella oxytoca NOT DETECTED NOT DETECTED Final   Klebsiella pneumoniae NOT DETECTED NOT DETECTED Final   Proteus species NOT DETECTED NOT DETECTED Final   Serratia marcescens NOT DETECTED NOT DETECTED Final   Carbapenem resistance NOT DETECTED NOT DETECTED Final   Haemophilus influenzae NOT DETECTED NOT DETECTED Final   Neisseria meningitidis NOT DETECTED NOT DETECTED Final   Pseudomonas aeruginosa NOT DETECTED NOT DETECTED Final   Candida albicans NOT DETECTED NOT DETECTED Final   Candida glabrata NOT DETECTED NOT DETECTED Final   Candida krusei NOT DETECTED NOT DETECTED Final   Candida parapsilosis NOT DETECTED NOT DETECTED Final   Candida tropicalis NOT DETECTED NOT DETECTED Final    Comment: Performed at [facility], [place] [address], [place], [place] [address]  Respiratory Panel by RT PCR (Flu A&B, Covid) - Nasopharyngeal Swab     Status: Abnormal   Collection Time: 09/15/19  2:30 AM   Specimen: Nasopharyngeal Swab  Result Value Ref Range Status   SARS Coronavirus 2 by RT PCR POSITIVE (A) NEGATIVE Final    Comment: CRITICAL RESULT CALLED TO, READ BACK BY AND VERIFIED WITH: L ANDREWS,RN @0437  09/15/19 MKELLY (NOTE) SARS-CoV-2 target nucleic acids are DETECTED. SARS-CoV-2 RNA is generally detectable in upper respiratory specimens  during the acute phase of infection. Positive results are  indicative of the presence of the identified virus, but do not rule out bacterial infection or co-infection with other pathogens not detected by the test. Clinical correlation with patient history and other diagnostic information is necessary to determine patient infection status. The expected result is Negative. Fact Sheet for Patients:  PinkCheek.be Fact Sheet for Healthcare Providers: GravelBags.it This test is not yet approved or cleared by the [place] FDA and  has been authorized for detection and/or diagnosis of SARS-CoV-2 by FDA under an Emergency Use Authorization (EUA).  This EUA will remain in effect (meaning this test can be  used) for the duration of  the COVID-19 declaration under Section 564(b)(1) of the Act, 21 U.S.C. section 360bbb-3(b)(1), unless the authorization is terminated or revoked sooner.    Influenza A by PCR NEGATIVE NEGATIVE Final   Influenza B by PCR NEGATIVE NEGATIVE Final    Comment: (NOTE) The Xpert Xpress SARS-CoV-2/FLU/RSV assay is intended as an aid in  the diagnosis of influenza from Nasopharyngeal swab specimens and  should not be used as a sole basis for treatment. Nasal washings and  aspirates are unacceptable for Xpert Xpress SARS-CoV-2/FLU/RSV  testing. Fact Sheet for Patients: PinkCheek.be Fact Sheet for Healthcare Providers: GravelBags.it This test is not yet approved or cleared by the [place] FDA and  has been authorized for detection and/or diagnosis of SARS-CoV-2 by  FDA under an Emergency Use Authorization (EUA). This EUA will remain  in effect (meaning this test can be used) for the duration of the  Covid-19 declaration under Section 564(b)(1) of the Act, 21  U.S.C. section 360bbb-3(b)(1), unless the authorization is  terminated or revoked. Performed at [facility], [address], [place], [place]  [address]   Culture, blood (routine x 2)     Status: None (Preliminary result)   Collection Time: 09/15/19  3:50 AM   Specimen: BLOOD  Result Value Ref Range Status   Specimen Description BLOOD RIGHT ANTECUBITAL  Final   Special Requests   Final  BOTTLES DRAWN AEROBIC AND ANAEROBIC Blood Culture adequate volume   Culture   Final    NO GROWTH 1 DAY Performed at [facility], [address], [place], [place] [address]    Report Status PENDING  Incomplete     Radiology Studies: DG Chest Portable 1 View  Result Date: 09/14/2019 CLINICAL DATA:  Altered mental status history of COVID positive EXAM: PORTABLE CHEST 1 VIEW COMPARISON:  06/25/2018 FINDINGS: Low lung volumes. Small foci of airspace disease in the right mid lung and left base. Mild cardiomegaly with aortic atherosclerosis. No pneumothorax. IMPRESSION: Low lung volumes. Small foci of airspace disease at the left base and right mid lung, could reflect mild pneumonia. Electronically Signed   By: Donavan Foil M.D.   On: 09/14/2019 16:29   [place] EKG SITE RITE  Result Date: 09/16/2019 If Site Rite image not attached, placement could not be confirmed due to current cardiac rhythm.   Scheduled Meds: . apixaban  5 mg Oral BID  . atorvastatin  40 mg Oral Daily  . carbidopa-levodopa  1 tablet Oral TID  . Chlorhexidine Gluconate Cloth  6 each Topical Daily  . dexamethasone (DECADRON) injection  6 mg Intravenous Q24H  . donepezil  5 mg Oral QHS  . DULoxetine  20 mg Oral BID  . feeding supplement (PRO-STAT SUGAR FREE 64)  30 mL Per Tube Daily  . free water  200 mL Per Tube Q4H  . gabapentin  100 mg Oral TID  . levETIRAcetam  500 mg Oral BID  . QUEtiapine  50 mg Oral QHS  . scopolamine  1 patch Transdermal Q72H   Continuous Infusions: . feeding supplement (OSMOLITE 1.5 CAL)    . meropenem (MERREM) IV 1 g (09/16/19 0836)  . norepinephrine (LEVOPHED) Adult infusion 8 mcg/min (09/16/19 0627)  . remdesivir 100 mg in NS 100 mL 100 mg (09/16/19  1009)     LOS: 1 day    Time spent: 35 minutes. Greater than 50% of this time was spent in direct contact with the patient, coordinating care and discussing relevant ongoing clinical issues, including acute resp failure with hypoxia and severe sepsis with septic shock. Patient remains dependable on peripheral IV Levophed, tachycardic, with elevated lactic acid and intermittently spiking fever. Latest blood work available demonstrating worsening renal function and hypokalemia. Patient difficult to stick and not further labs present at this moment. Family updated. Consent for PICC line and understanding not a candidate for HD if renal function further deteriorates. Will continue current treatment and follow response; but prognosis is very poor.    Barton Dubois, MD [facility] Pager [phone]   09/16/2019, 11:23 AM

## 2019-09-17 DIAGNOSIS — N1832 Chronic kidney disease, stage 3b: Secondary | ICD-10-CM

## 2019-09-17 DIAGNOSIS — B9620 Unspecified Escherichia coli [E. coli] as the cause of diseases classified elsewhere: Secondary | ICD-10-CM

## 2019-09-17 DIAGNOSIS — R7881 Bacteremia: Secondary | ICD-10-CM

## 2019-09-17 DIAGNOSIS — N179 Acute kidney failure, unspecified: Secondary | ICD-10-CM

## 2019-09-17 LAB — CBC WITH DIFFERENTIAL/PLATELET
Abs Immature Granulocytes: 0.47 10*3/uL — ABNORMAL HIGH (ref 0.00–0.07)
Basophils Absolute: 0.1 10*3/uL (ref 0.0–0.1)
Basophils Relative: 0 %
Eosinophils Absolute: 0.4 10*3/uL (ref 0.0–0.5)
Eosinophils Relative: 1 %
HCT: 38.8 % — ABNORMAL LOW (ref 39.0–52.0)
Hemoglobin: 12.4 g/dL — ABNORMAL LOW (ref 13.0–17.0)
Immature Granulocytes: 2 %
Lymphocytes Relative: 3 %
Lymphs Abs: 0.9 10*3/uL (ref 0.7–4.0)
MCH: 30.2 pg (ref 26.0–34.0)
MCHC: 32 g/dL (ref 30.0–36.0)
MCV: 94.6 fL (ref 80.0–100.0)
Monocytes Absolute: 0.8 10*3/uL (ref 0.1–1.0)
Monocytes Relative: 3 %
Neutro Abs: 28.5 10*3/uL — ABNORMAL HIGH (ref 1.7–7.7)
Neutrophils Relative %: 91 %
Platelets: 88 10*3/uL — ABNORMAL LOW (ref 150–400)
RBC: 4.1 MIL/uL — ABNORMAL LOW (ref 4.22–5.81)
RDW: 15.4 % (ref 11.5–15.5)
WBC: 31.3 10*3/uL — ABNORMAL HIGH (ref 4.0–10.5)
nRBC: 0 % (ref 0.0–0.2)

## 2019-09-17 LAB — COMPREHENSIVE METABOLIC PANEL
ALT: 22 U/L (ref 0–44)
AST: 66 U/L — ABNORMAL HIGH (ref 15–41)
Albumin: 2.4 g/dL — ABNORMAL LOW (ref 3.5–5.0)
Alkaline Phosphatase: 95 U/L (ref 38–126)
Anion gap: 14 (ref 5–15)
BUN: 57 mg/dL — ABNORMAL HIGH (ref 8–23)
CO2: 19 mmol/L — ABNORMAL LOW (ref 22–32)
Calcium: 7.6 mg/dL — ABNORMAL LOW (ref 8.9–10.3)
Chloride: 113 mmol/L — ABNORMAL HIGH (ref 98–111)
Creatinine, Ser: 1.95 mg/dL — ABNORMAL HIGH (ref 0.61–1.24)
GFR calc Af Amer: 38 mL/min — ABNORMAL LOW (ref >60)
GFR calc non Af Amer: 33 mL/min — ABNORMAL LOW (ref >60)
Glucose, Bld: 242 mg/dL — ABNORMAL HIGH (ref 70–99)
Potassium: 3 mmol/L — ABNORMAL LOW (ref 3.5–5.1)
Sodium: 146 mmol/L — ABNORMAL HIGH (ref 135–145)
Total Bilirubin: 0.7 mg/dL (ref 0.3–1.2)
Total Protein: 5.9 g/dL — ABNORMAL LOW (ref 6.5–8.1)

## 2019-09-17 LAB — D-DIMER, QUANTITATIVE: D-Dimer, Quant: 3.93 ug/mL-FEU — ABNORMAL HIGH (ref 0.00–0.50)

## 2019-09-17 LAB — URINE CULTURE: Culture: >=100000 — AB

## 2019-09-17 LAB — LACTIC ACID, PLASMA: Lactic Acid, Venous: 2.2 mmol/L (ref 0.5–1.9)

## 2019-09-17 LAB — FERRITIN: Ferritin: 2385 ng/mL — ABNORMAL HIGH (ref 24–336)

## 2019-09-17 LAB — C DIFFICILE QUICK SCREEN W PCR REFLEX
C Diff antigen: NEGATIVE
C Diff interpretation: NOT DETECTED
C Diff toxin: NEGATIVE

## 2019-09-17 LAB — C-REACTIVE PROTEIN: CRP: 33.8 mg/dL — ABNORMAL HIGH (ref <1.0)

## 2019-09-17 MED ORDER — ATORVASTATIN CALCIUM 40 MG PO TABS
40.0000 mg | ORAL_TABLET | Freq: Every day | ORAL | Status: DC
  Administered 2019-09-17 – 2019-09-21 (×5): 40 mg
  Filled 2019-09-17 (×5): qty 1

## 2019-09-17 MED ORDER — LEVETIRACETAM 100 MG/ML PO SOLN
500.0000 mg | Freq: Two times a day (BID) | ORAL | Status: DC
  Administered 2019-09-17 – 2019-09-21 (×9): 500 mg
  Filled 2019-09-17 (×12): qty 5

## 2019-09-17 MED ORDER — SACCHAROMYCES BOULARDII 250 MG PO CAPS
250.0000 mg | ORAL_CAPSULE | Freq: Two times a day (BID) | ORAL | Status: DC
  Administered 2019-09-17 – 2019-09-21 (×9): 250 mg
  Filled 2019-09-17 (×9): qty 1

## 2019-09-17 MED ORDER — METOPROLOL TARTRATE 5 MG/5ML IV SOLN
2.5000 mg | Freq: Three times a day (TID) | INTRAVENOUS | Status: DC | PRN
  Administered 2019-09-17: 22:00:00 2.5 mg via INTRAVENOUS
  Filled 2019-09-17: qty 5

## 2019-09-17 MED ORDER — FREE WATER
300.0000 mL | Freq: Four times a day (QID) | Status: DC
  Administered 2019-09-17 – 2019-09-21 (×16): 300 mL

## 2019-09-17 MED ORDER — APIXABAN 5 MG PO TABS
5.0000 mg | ORAL_TABLET | Freq: Two times a day (BID) | ORAL | Status: DC
  Administered 2019-09-17 – 2019-09-21 (×9): 5 mg
  Filled 2019-09-17 (×9): qty 1

## 2019-09-17 MED ORDER — DONEPEZIL HCL 5 MG PO TABS
5.0000 mg | ORAL_TABLET | Freq: Every day | ORAL | Status: DC
  Administered 2019-09-17 – 2019-09-20 (×4): 5 mg
  Filled 2019-09-17 (×4): qty 1

## 2019-09-17 MED ORDER — CARBIDOPA-LEVODOPA 25-100 MG PO TABS
1.0000 | ORAL_TABLET | Freq: Three times a day (TID) | ORAL | Status: DC
  Administered 2019-09-17 – 2019-09-21 (×13): 1
  Filled 2019-09-17 (×13): qty 1

## 2019-09-17 MED ORDER — SACCHAROMYCES BOULARDII 250 MG PO CAPS: 250.0000 mg | ORAL_CAPSULE | Freq: Two times a day (BID) | ORAL | Status: DC

## 2019-09-17 MED ORDER — GABAPENTIN 250 MG/5ML PO SOLN
100.0000 mg | Freq: Three times a day (TID) | ORAL | Status: DC
  Administered 2019-09-17 – 2019-09-21 (×13): 100 mg
  Filled 2019-09-17 (×17): qty 2

## 2019-09-17 MED ORDER — POTASSIUM CHLORIDE IN NACL 20-0.45 MEQ/L-% IV SOLN
INTRAVENOUS | Status: DC
  Filled 2019-09-17 (×2): qty 1000

## 2019-09-17 MED ORDER — QUETIAPINE FUMARATE 25 MG PO TABS
50.0000 mg | ORAL_TABLET | Freq: Every day | ORAL | Status: DC
  Administered 2019-09-17 – 2019-09-20 (×4): 50 mg
  Filled 2019-09-17 (×4): qty 2

## 2019-09-17 NOTE — Progress Notes (Signed)
Lab called critical result lactic of 2.2. Gave verbal report to Dr. Dyann Kief.

## 2019-09-17 NOTE — Progress Notes (Signed)
Per pharmacist, Cymbalta should not be given per tube. Hold as there is no alternative to medication administration.

## 2019-09-17 NOTE — Progress Notes (Signed)
PROGRESS NOTE    Mark [name]  [id number] DOB: November 28, 1945 DOA: 09/14/2019 PCP: Caprice Renshaw, MD     Brief Narrative:  74 y.o. male, with history of hypertension, CAD, paroxysmal atrial fibrillation, advanced Parkinson disease, dementia, paroxysmal atrial fibrillation on apixaban, hypertension, CKD stage III, history of seizures who was diagnosed with COVID-19 infection on 09/10/2019.  Presented from nursing home with 2-day history of reduced responsiveness and poor p.o. intake.  Patient gets most of his nutrition and fluid through the feeding tube.  There is no history of nausea vomiting or diarrhea.  He does have history of frequent UTIs.  Patient has been less verbal as per patient's wife.  Patient usually not able to carry conversation at baseline. In the ED patient was found to have UTI, and was going to be discharged home.  Patient became hypotensive in the ED, he was given Rocephin and IV fluid boluses.  Currently he is requiring 4 L/min of oxygen.  He is unresponsive and unable to provide any history.    Assessment & Plan: 1-acute resp failure with hypoxia due to COVID PNA -requiring non-rebreathr initially -down to 4-5L currently -will continue remdesivir and IV steroids -continue supportive care -continue to follow inflammatory markers  2-severe sepsis with septic shock -due to E, coli UTI/bactermia -follow final culture results -continue current IV antibiotics -continue IVF's -patient with resp distress and renal failure as organ dysfunctions   3-lactic acidosis -due to sepsis -trending down appropriately after IVF's -will follow trend  4-chronic paroxysmal a. Fib  -continue apixaban for now -anticoagulation might need to be discontinue and transitioned to heparin if renal function worsens further.  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  7-parkinson's dementia -continue sinemet -patient meds and nutrition given through PEG  8-hx of seizure -will continue keppra -so far well tolerated. -no seizure appreciated.   9-DNR -patient DNR/DNI prior to admission -long discussion about prognosis and limited response so far with family -will continue to treat what is treatable -Palliative care consulted for advance directives. -poor prognosis and if hospital death occurred, not a surprise.  10-stage 3 sacrum pressure injury -continue preventive measures -POA -patient bed-bound at baseline  11-diarrhea -flexiseal in place to protect skin integrity; patient with stage 3 sacrum pressure injury already. -will start florastor and check for C.diff -patient on chronic tube feeding and on antibiotics.   DVT prophylaxis: on apixaban Code Status: DNR/DNI Family Communication: wife over the phone Disposition Plan: remains in ICU, continue IV sterodis, IV remdesivir and IV antibiotics. PICC line in place now. Will attempt to wean off Levophed, continue to check CVP, provide IVF's, replete electrolytes and check for C. Diff.  Prognosis remains poor and guarded.  Consultants:   Palliative care  intensivist curbside (Dr. Elsworth Soho) on 09/15/19; continue current management.   Procedures:   See below for x-ray reports.   Antimicrobials:  Anti-infectives (From admission, onward)   Start     Dose/Rate Route Frequency Ordered Stop   09/16/19 1000  remdesivir 100 mg in sodium chloride 0.9 % 100 mL IVPB  Status:  Discontinued     100 mg 200 mL/hr over 30  Minutes Intravenous Daily 09/15/19 0054  09/15/19 0059   09/16/19 1000  remdesivir 100 mg in sodium chloride 0.9 % 100 mL IVPB     100 mg 200 mL/hr over 30 Minutes Intravenous Daily 09/15/19 0022 09/20/19 0959   09/15/19 2200  cefTRIAXone (ROCEPHIN) 1 g in sodium chloride 0.9 % 100 mL IVPB  Status:  Discontinued     1 g 200 mL/hr over 30 Minutes Intravenous Every 24 hours 09/15/19 0054 09/15/19 1010   09/15/19 1100  meropenem (MERREM) 1 g in sodium chloride 0.9 % 100 mL IVPB     1 g 200 mL/hr over 30 Minutes Intravenous Every 12 hours 09/15/19 1049     09/15/19 0100  remdesivir 200 mg in sodium chloride 0.9% 250 mL IVPB     200 mg 580 mL/hr over 30 Minutes Intravenous Once 09/15/19 0022 09/15/19 0245   09/15/19 0054  remdesivir 200 mg in sodium chloride 0.9% 250 mL IVPB  Status:  Discontinued     200 mg 580 mL/hr over 30 Minutes Intravenous Once 09/15/19 0054 09/15/19 0059   09/14/19 1900  cefTRIAXone (ROCEPHIN) 1 g in sodium chloride 0.9 % 100 mL IVPB     1 g 200 mL/hr over 30 Minutes Intravenous  Once 09/14/19 1858 09/14/19 2142      Subjective: Currently afebrile, patient was able to track me inside the room today.  Nonverbal and not following commands.  Requiring 4 L nasal cannula supplementation and albuterol with improvement in his vital signs.  Still requiring IV Levophed, receiving IV antibiotics for bacteremia and IV steroids and remdesivir for Covid infection.  Patient also now with diarrhea.  Objective: Vitals:   09/17/19 0700 09/17/19 0720 09/17/19 0740 09/17/19 0800  BP: 109/67 100/71 99/71 113/67  Pulse: 96 95 94 97  Resp: 20 20 20 19   Temp:      TempSrc:      SpO2: 98% 99% 98% 98%  Weight:      Height:        Intake/Output Summary (Last 24 hours) at 09/17/2019 0928 Last data filed at 09/16/2019 1851 Gross per 24 hour  Intake 543.7 ml  Output 350 ml  Net 193.7 ml   Filed Weights   09/14/19 1434 09/15/19 1851 09/17/19 0500  Weight: 102.8 kg 107.2 kg 107.9 kg    Examination: General exam: Awake,  tracking a little more when entering the room; nonverbal and unable to follow commands.  Stable oxygen saturation on 4 L; no febrile. Respiratory system: Improved air movement bilaterally; still with presence of rhonchi, no wheezing.  No using accessory muscles.  Stable respiratory rate.  Upper airways without increased secretion sounds today. Cardiovascular system: Irregular, no rubs, no gallops, no JVD. Gastrointestinal system: Abdomen is nondistended, soft and nontender.  PEG tube in place.  Positive bowel sounds. Central nervous system: Nonverbal, bedbound at baseline; not following commands.  Patient was tracking myself inside the room today. Extremities: No cyanosis or clubbing; no edema. Skin: No rashes, no petechiae; stage III sacral and pressure injury present on admission.  No signs of superimposed infection. Psychiatry: Mood & affect appropriate.  Unable to assess insight/judgment secondary to dementia and nonverbal status.   Data Reviewed: I have personally reviewed following labs and imaging studies  CBC: Recent Labs  Lab 09/14/19 1551 09/15/19 0350 09/16/19 0842 09/17/19 0429  WBC 3.5* 4.7 36.0* 31.3*  NEUTROABS 1.7 4.2 28.8* 28.5*  HGB 13.6 11.9* 13.8 12.4*  HCT 43.4 39.0 42.7 38.8*  MCV 97.5  97.5 94.9 94.6  PLT 263 170 108* 88*   Basic Metabolic Panel: Recent Labs  Lab 09/14/19 1551 09/15/19 0350 09/16/19 0842 09/17/19 0429  NA 147* 145 146* 146*  K 3.4* 2.6* 3.5 3.0*  CL 105 112* 115* 113*  CO2 31 19* 19* 19*  GLUCOSE 99 115* 128* 242*  BUN 24* 27* 53* 57*  CREATININE 1.57* 2.29* 2.34* 1.95*  CALCIUM 8.7* 7.4* 7.3* 7.6*  MG  --  1.5*  --   --    GFR: Estimated Creatinine Clearance: 44.1 mL/min (A) (by C-G formula based on SCr of 1.95 mg/dL (H)).   Liver Function Tests: Recent Labs  Lab 09/14/19 1551 09/15/19 0350 09/16/19 0842 09/17/19 0429  AST 21 48* 75* 66*  ALT 5 21 24 22   ALKPHOS 88 97 100 95  BILITOT 0.6 1.7* 1.0 0.7  PROT 7.8 6.1* 6.4*  5.9*  ALBUMIN 3.5 2.8* 2.7* 2.4*     Anemia Panel: Recent Labs    09/16/19 0500 09/17/19 0429  FERRITIN 3,069* 2,385*   Urine analysis:    Component Value Date/Time   COLORURINE YELLOW 09/14/2019 1735   APPEARANCEUR HAZY (A) 09/14/2019 1735   LABSPEC 1.011 09/14/2019 1735   PHURINE 6.0 09/14/2019 1735   GLUCOSEU NEGATIVE 09/14/2019 1735   HGBUR LARGE (A) 09/14/2019 1735   BILIRUBINUR NEGATIVE 09/14/2019 1735   KETONESUR NEGATIVE 09/14/2019 1735   PROTEINUR NEGATIVE 09/14/2019 1735   UROBILINOGEN 0.2 12/26/2013 0817   NITRITE POSITIVE (A) 09/14/2019 1735   LEUKOCYTESUR SMALL (A) 09/14/2019 1735    Recent Results (from the past 240 hour(s))  Urine culture     Status: Abnormal   Collection Time: 09/14/19  5:35 PM   Specimen: Urine, Random  Result Value Ref Range Status   Specimen Description   Final    URINE, RANDOM Performed at [facility], [address] [place], [place] [address]    Special Requests   Final    NONE Performed at [facility], [address] [place], [place] [address]    Culture >=100,000 COLONIES/mL ESCHERICHIA COLI (A)  Final   Report Status 09/17/2019 FINAL  Final   Organism ID, Bacteria ESCHERICHIA COLI (A)  Final      Susceptibility   Escherichia coli - MIC*    AMPICILLIN <=2 SENSITIVE Sensitive     CEFAZOLIN <=4 SENSITIVE Sensitive     CEFTRIAXONE <=0.25 SENSITIVE Sensitive     CIPROFLOXACIN <=0.25 SENSITIVE Sensitive     GENTAMICIN <=1 SENSITIVE Sensitive     IMIPENEM <=0.25 SENSITIVE Sensitive     NITROFURANTOIN <=16 SENSITIVE Sensitive     TRIMETH/SULFA <=20 SENSITIVE Sensitive     AMPICILLIN/SULBACTAM <=2 SENSITIVE Sensitive     PIP/TAZO <=4 SENSITIVE Sensitive     * >=100,000 COLONIES/mL ESCHERICHIA COLI  Culture, blood (routine x 2)     Status: Abnormal (Preliminary result)   Collection Time: 09/14/19  8:48 PM   Specimen: BLOOD RIGHT ARM  Result Value Ref Range Status   Specimen Description   Final    BLOOD RIGHT  ARM Performed at [facility], [address], [place], [place] [address]    Special Requests   Final    BOTTLES DRAWN AEROBIC AND ANAEROBIC Blood Culture adequate volume Performed at [facility], [address] [place], [place] [address]    Culture  Setup Time   Final    GRAM NEGATIVE RODS IN BOTH AEROBIC AND ANAEROBIC BOTTLES Gram Stain Report Called to,Read Back By and Verified With: Hassell Done  D. AT [id number] ON [id number] BY THOMPSON S. CRITICAL RESULT CALLED TO, READ BACK BY AND VERIFIED WITH: G. Coffee PharmD 15:35 09/15/19 ([email or web address]) Performed at [facility], [place] [address], [place], [place] [address]    Culture ESCHERICHIA COLI (A)  Final   Report Status PENDING  Incomplete  Blood Culture ID Panel (Reflexed)     Status: Abnormal   Collection Time: 09/14/19  8:48 PM  Result Value Ref Range Status   Enterococcus species NOT DETECTED NOT DETECTED Final   Listeria monocytogenes NOT DETECTED NOT DETECTED Final   Staphylococcus species NOT DETECTED NOT DETECTED Final   Staphylococcus aureus (BCID) NOT DETECTED NOT DETECTED Final   Streptococcus species NOT DETECTED NOT DETECTED Final   Streptococcus agalactiae NOT DETECTED NOT DETECTED Final   Streptococcus pneumoniae NOT DETECTED NOT DETECTED Final   Streptococcus pyogenes NOT DETECTED NOT DETECTED Final   Acinetobacter baumannii NOT DETECTED NOT DETECTED Final   Enterobacteriaceae species DETECTED (A) NOT DETECTED Final    Comment: Enterobacteriaceae represent a large family of gram-negative bacteria, not a single organism. CRITICAL RESULT CALLED TO, READ BACK BY AND VERIFIED WITH: G. Coffee PharmD 15:35 09/15/19 ([email or web address])    Enterobacter cloacae complex NOT DETECTED NOT DETECTED Final   Escherichia coli DETECTED (A) NOT DETECTED Final    Comment: CRITICAL RESULT CALLED TO, READ BACK BY AND VERIFIED WITH: G. Coffee PharmD 15:35 09/15/19 ([email or web address])    Klebsiella oxytoca NOT DETECTED NOT DETECTED Final   Klebsiella pneumoniae NOT  DETECTED NOT DETECTED Final   Proteus species NOT DETECTED NOT DETECTED Final   Serratia marcescens NOT DETECTED NOT DETECTED Final   Carbapenem resistance NOT DETECTED NOT DETECTED Final   Haemophilus influenzae NOT DETECTED NOT DETECTED Final   Neisseria meningitidis NOT DETECTED NOT DETECTED Final   Pseudomonas aeruginosa NOT DETECTED NOT DETECTED Final   Candida albicans NOT DETECTED NOT DETECTED Final   Candida glabrata NOT DETECTED NOT DETECTED Final   Candida krusei NOT DETECTED NOT DETECTED Final   Candida parapsilosis NOT DETECTED NOT DETECTED Final   Candida tropicalis NOT DETECTED NOT DETECTED Final    Comment: Performed at [facility], [place] [address] [place], [place] [address]  Respiratory Panel by RT PCR (Flu A&B, Covid) - Nasopharyngeal Swab     Status: Abnormal   Collection Time: 09/15/19  2:30 AM   Specimen: Nasopharyngeal Swab  Result Value Ref Range Status   SARS Coronavirus 2 by RT PCR POSITIVE (A) NEGATIVE Final    Comment: CRITICAL RESULT CALLED TO, READ BACK BY AND VERIFIED WITH: L ANDREWS,RN @0437  09/15/19 MKELLY (NOTE) SARS-CoV-2 target nucleic acids are DETECTED. SARS-CoV-2 RNA is generally detectable in upper respiratory specimens  during the acute phase of infection. Positive results are indicative of the presence of the identified virus, but do not rule out bacterial infection or co-infection with other pathogens not detected by the test. Clinical correlation with patient history and other diagnostic information is necessary to determine patient infection status. The expected result is Negative. Fact Sheet for Patients:  PinkCheek.be Fact Sheet for Healthcare Providers: GravelBags.it This test is not yet approved or cleared by the [place] FDA and  has been authorized for detection and/or diagnosis of SARS-CoV-2 by FDA under an Emergency Use Authorization (EUA).  This EUA will remain  in effect (meaning this test can be  used) for the duration of  the COVID-19 declaration under Section 564(b)(1) of the Act, 21 U.S.C. section 360bbb-3(b)(1), unless the authorization is terminated  or revoked sooner.    Influenza A by PCR NEGATIVE NEGATIVE Final   Influenza B by PCR NEGATIVE NEGATIVE Final    Comment: (NOTE) The Xpert Xpress SARS-CoV-2/FLU/RSV assay is intended as an aid in  the diagnosis of influenza from Nasopharyngeal swab specimens and  should not be used as a sole basis for treatment. Nasal washings and  aspirates are unacceptable for Xpert Xpress SARS-CoV-2/FLU/RSV  testing. Fact Sheet for Patients: PinkCheek.be Fact Sheet for Healthcare Providers: GravelBags.it This test is not yet approved or cleared by the [place] FDA and  has been authorized for detection and/or diagnosis of SARS-CoV-2 by  FDA under an Emergency Use Authorization (EUA). This EUA will remain  in effect (meaning this test can be used) for the duration of the  Covid-19 declaration under Section 564(b)(1) of the Act, 21  U.S.C. section 360bbb-3(b)(1), unless the authorization is  terminated or revoked. Performed at [facility], [address] [place], [place] [address]   Culture, blood (routine x 2)     Status: None (Preliminary result)   Collection Time: 09/15/19  3:50 AM   Specimen: BLOOD  Result Value Ref Range Status   Specimen Description BLOOD RIGHT ANTECUBITAL  Final   Special Requests   Final    BOTTLES DRAWN AEROBIC AND ANAEROBIC Blood Culture adequate volume   Culture   Final    NO GROWTH 1 DAY Performed at [facility], [address] [place], [place] [address]    Report Status PENDING  Incomplete  MRSA PCR Screening     Status: None   Collection Time: 09/16/19  7:03 AM   Specimen: Nasopharyngeal  Result Value Ref Range Status   MRSA by PCR NEGATIVE NEGATIVE Final    Comment:        The GeneXpert MRSA Assay  (FDA approved for NASAL specimens only), is one component of a comprehensive MRSA colonization surveillance program. It is not intended to diagnose MRSA infection nor to guide or monitor treatment for MRSA infections. Performed at [facility], [address] [place], [place] [address]      Radiology Studies: [place] EKG SITE RITE  Result Date: 09/16/2019 If Site Rite image not attached, placement could not be confirmed due to current cardiac rhythm.   Scheduled Meds: . apixaban  5 mg Per Tube BID  . atorvastatin  40 mg Per Tube Daily  . carbidopa-levodopa  1 tablet Per Tube TID  . chlorhexidine  15 mL Mouth Rinse BID  . Chlorhexidine Gluconate Cloth  6 each Topical Daily  . dexamethasone (DECADRON) injection  6 mg Intravenous Q24H  . donepezil  5 mg Per Tube QHS  . DULoxetine  20 mg Oral BID  . feeding supplement (PRO-STAT SUGAR FREE 64)  30 mL Per Tube Daily  . free water  300 mL Per Tube Q6H  . gabapentin  100 mg Per Tube TID  . levETIRAcetam  500 mg Per Tube BID  . mouth rinse  15 mL Mouth Rinse q12n4p  . QUEtiapine  50 mg Per Tube QHS  . scopolamine  1 patch Transdermal Q72H   Continuous Infusions: . 0.45 % NaCl with KCl 20 mEq / L    . feeding supplement (OSMOLITE 1.5 CAL) 1,000 mL (09/16/19 1552)  . meropenem (MERREM) IV 1 g (09/16/19 2132)  . norepinephrine (LEVOPHED) Adult infusion 5 mcg/min (09/17/19 0058)  . remdesivir 100 mg in NS 100 mL Stopped (09/16/19 1040)     LOS: 2 days  Time spent: 35 minutes.  Greater than 50% of this time spent in direct contact with the patient, coordinating care and discussing with clinical issues/updating family members.  Patient prognosis continue to be poor and guarded; but with current level of support, his condition is stabilizing. X-rays: Currently afebrile, will continue IV antibiotics, IV steroids, IV remdesivir and electrolytes repletion.  Patient will be started on Florastor and C. Diff will be checked, as he is also  having diarrhea most likely in the setting of antibiotic usage and tube feedings.  Will attempt to wean off Levophed and will continue electrolytes repletion, IV fluids for elevated lactic acid and adjusted dose of free water for hypernatremia.   Barton Dubois, MD [facility] Pager [phone]   09/17/2019, 9:28 AM

## 2019-09-18 LAB — COMPREHENSIVE METABOLIC PANEL
ALT: 19 U/L (ref 0–44)
AST: 62 U/L — ABNORMAL HIGH (ref 15–41)
Albumin: 2.1 g/dL — ABNORMAL LOW (ref 3.5–5.0)
Alkaline Phosphatase: 102 U/L (ref 38–126)
Anion gap: 10 (ref 5–15)
BUN: 62 mg/dL — ABNORMAL HIGH (ref 8–23)
CO2: 22 mmol/L (ref 22–32)
Calcium: 8 mg/dL — ABNORMAL LOW (ref 8.9–10.3)
Chloride: 116 mmol/L — ABNORMAL HIGH (ref 98–111)
Creatinine, Ser: 1.55 mg/dL — ABNORMAL HIGH (ref 0.61–1.24)
GFR calc Af Amer: 51 mL/min — ABNORMAL LOW (ref >60)
GFR calc non Af Amer: 44 mL/min — ABNORMAL LOW (ref >60)
Glucose, Bld: 202 mg/dL — ABNORMAL HIGH (ref 70–99)
Potassium: 2.8 mmol/L — ABNORMAL LOW (ref 3.5–5.1)
Sodium: 148 mmol/L — ABNORMAL HIGH (ref 135–145)
Total Bilirubin: 0.5 mg/dL (ref 0.3–1.2)
Total Protein: 5.4 g/dL — ABNORMAL LOW (ref 6.5–8.1)

## 2019-09-18 LAB — LACTIC ACID, PLASMA: Lactic Acid, Venous: 1.7 mmol/L (ref 0.5–1.9)

## 2019-09-18 LAB — CBC WITH DIFFERENTIAL/PLATELET
Abs Immature Granulocytes: 0.12 10*3/uL — ABNORMAL HIGH (ref 0.00–0.07)
Basophils Absolute: 0 10*3/uL (ref 0.0–0.1)
Basophils Relative: 0 %
Eosinophils Absolute: 0 10*3/uL (ref 0.0–0.5)
Eosinophils Relative: 0 %
HCT: 34.5 % — ABNORMAL LOW (ref 39.0–52.0)
Hemoglobin: 11.2 g/dL — ABNORMAL LOW (ref 13.0–17.0)
Immature Granulocytes: 1 %
Lymphocytes Relative: 5 %
Lymphs Abs: 1 10*3/uL (ref 0.7–4.0)
MCH: 30.8 pg (ref 26.0–34.0)
MCHC: 32.5 g/dL (ref 30.0–36.0)
MCV: 94.8 fL (ref 80.0–100.0)
Monocytes Absolute: 1 10*3/uL (ref 0.1–1.0)
Monocytes Relative: 5 %
Neutro Abs: 16.9 10*3/uL — ABNORMAL HIGH (ref 1.7–7.7)
Neutrophils Relative %: 89 %
Platelets: 64 10*3/uL — ABNORMAL LOW (ref 150–400)
RBC: 3.64 MIL/uL — ABNORMAL LOW (ref 4.22–5.81)
RDW: 15.6 % — ABNORMAL HIGH (ref 11.5–15.5)
WBC: 19 10*3/uL — ABNORMAL HIGH (ref 4.0–10.5)
nRBC: 0 % (ref 0.0–0.2)

## 2019-09-18 LAB — HEMOGLOBIN A1C
Hgb A1c MFr Bld: 5.5 % (ref 4.8–5.6)
Mean Plasma Glucose: 111.15 mg/dL

## 2019-09-18 LAB — D-DIMER, QUANTITATIVE: D-Dimer, Quant: 1.41 ug/mL-FEU — ABNORMAL HIGH (ref 0.00–0.50)

## 2019-09-18 LAB — CULTURE, BLOOD (ROUTINE X 2): Special Requests: ADEQUATE

## 2019-09-18 LAB — FERRITIN: Ferritin: 1397 ng/mL — ABNORMAL HIGH (ref 24–336)

## 2019-09-18 LAB — C-REACTIVE PROTEIN: CRP: 20.1 mg/dL — ABNORMAL HIGH (ref <1.0)

## 2019-09-18 LAB — GLUCOSE, CAPILLARY
Glucose-Capillary: 234 mg/dL — ABNORMAL HIGH (ref 70–99)
Glucose-Capillary: 218 mg/dL — ABNORMAL HIGH (ref 70–99)

## 2019-09-18 MED ORDER — INSULIN ASPART 100 UNIT/ML ~~LOC~~ SOLN
0.0000 [IU] | Freq: Three times a day (TID) | SUBCUTANEOUS | Status: DC
  Administered 2019-09-18 (×2): 3 [IU] via SUBCUTANEOUS
  Administered 2019-09-19: 08:00:00 5 [IU] via SUBCUTANEOUS

## 2019-09-18 MED ORDER — POTASSIUM CL IN DEXTROSE 5% 20 MEQ/L IV SOLN
20.0000 meq | INTRAVENOUS | Status: DC
  Administered 2019-09-18 – 2019-09-20 (×4): 20 meq via INTRAVENOUS
  Filled 2019-09-18 (×8): qty 1000

## 2019-09-18 MED ORDER — MAGNESIUM SULFATE IN D5W 1-5 GM/100ML-% IV SOLN
1.0000 g | Freq: Once | INTRAVENOUS | Status: AC
  Administered 2019-09-18: 1 g via INTRAVENOUS
  Filled 2019-09-18: qty 100

## 2019-09-18 MED ORDER — POTASSIUM CHLORIDE 20 MEQ/15ML (10%) PO SOLN
20.0000 meq | ORAL | Status: AC
  Administered 2019-09-18 (×3): 20 meq via ORAL
  Filled 2019-09-18 (×2): qty 30

## 2019-09-18 MED ORDER — INSULIN DETEMIR 100 UNIT/ML ~~LOC~~ SOLN
7.0000 [IU] | Freq: Every day | SUBCUTANEOUS | Status: DC
  Administered 2019-09-18 – 2019-09-20 (×3): 7 [IU] via SUBCUTANEOUS
  Filled 2019-09-18 (×6): qty 0.07

## 2019-09-18 MED ORDER — SODIUM CHLORIDE 0.9 % IV SOLN
2.0000 g | INTRAVENOUS | Status: DC
  Administered 2019-09-18 – 2019-09-21 (×4): 2 g via INTRAVENOUS
  Filled 2019-09-18 (×4): qty 20

## 2019-09-18 MED ORDER — SODIUM CHLORIDE 0.9 % IV SOLN
1.0000 g | Freq: Three times a day (TID) | INTRAVENOUS | Status: DC
  Filled 2019-09-18: qty 1

## 2019-09-18 NOTE — Progress Notes (Signed)
PROGRESS NOTE    Mark [name]  [id number] DOB: 01/20/1946 DOA: 09/14/2019 PCP: Caprice Renshaw, MD     Brief Narrative:  74 y.o. male, with history of hypertension, CAD, paroxysmal atrial fibrillation, advanced Parkinson disease, dementia, paroxysmal atrial fibrillation on apixaban, hypertension, CKD stage III, history of seizures who was diagnosed with COVID-19 infection on 09/10/2019.  Presented from nursing home with 2-day history of reduced responsiveness and poor p.o. intake.  Patient gets most of his nutrition and fluid through the feeding tube.  There is no history of nausea vomiting or diarrhea.  He does have history of frequent UTIs.  Patient has been less verbal as per patient's wife.  Patient usually not able to carry conversation at baseline. In the ED patient was found to have UTI, and was going to be discharged home.  Patient became hypotensive in the ED, he was given Rocephin and IV fluid boluses.  Currently he is requiring 4 L/min of oxygen.  He is unresponsive and unable to provide any history.    Assessment & Plan: 1-acute resp failure with hypoxia due to COVID PNA -requiring non-rebreathr initially -down to 3L currently -will continue remdesivir and IV steroids (Day #4) -continue supportive care -continue to follow inflammatory markers  2-severe sepsis with septic shock -due to E, coli UTI/bactermia -Per culture results microorganism is pansensitive. -continue IV antibiotics, but will transition to Rocephin) to de-escalate based on sensitivity. -Continue supportive care. -patient with resp distress and renal failure as organ dysfunctions on presentation. -WBC's trending down and patient is afebrile.  3-lactic acidosis -due to sepsis -After fluid resuscitation lactic acid is within normal limits.  4-chronic paroxysmal a. Fib  -continue apixaban for now -anticoagulation might need to be discontinue and transitioned to heparin if renal function worsens  further. -As needed IV Lopressor while overall recovering hypotension -Positive atrial fibrillation appreciated on telemetry.  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 -Continue adjusted free water dosage to assist with hyponatremia and will provide 10 hours of D5W. -Continue to follow electrolytes trend  6-Altered mental status -in the setting of metabolic encephalopathy -patient with underlying hx of dementia and parkinson's -continue constant reorientation -non verbal at baseline -appears to be tracking today and is more awake/interactive.  7-parkinson's dementia -continue sinemet -patient meds and nutrition given through PEG  8-hx of seizure -will continue keppra -so far well tolerated. -no seizure appreciated.   9-DNR -patient DNR/DNI prior to admission -long discussion about prognosis and limited response so far with family -will continue to treat what is treatable -Palliative care consulted for advance directives. -poor prognosis and if hospital death occurred, not a surprise.  10-stage 3 sacrum pressure injury -continue preventive measures -Pressure injury was present prior to admission.   -Patient is bed-bound at baseline  11-diarrhea -flexiseal in place to protect skin integrity; patient with stage 3 sacrum pressure injury already. -C. difficile negative; continue the use of Florastor. -patient on chronic tube feeding and on antibiotics.   DVT prophylaxis: on apixaban Code Status: DNR/DNI Family Communication: wife over the phone Disposition Plan: remains in ICU, continue IV sterodis, IV remdesivir and IV antibiotics.  Successfully weaned off Levophed at this moment.  Lactic acid is resolved.  Continue repleting electrolytes and  providing supportive care.  Prognosis remains guarded and long-term is poor..  Consultants:  Palliative care  intensivist curbside (Dr. Elsworth Soho) on 09/15/19; continue current management.   Procedures:   See below for x-ray reports.   Antimicrobials:  Anti-infectives (From admission, onward)   Start     Dose/Rate Route Frequency Ordered Stop   09/18/19 0900  cefTRIAXone (ROCEPHIN) 2 g in sodium chloride 0.9 % 100 mL IVPB     2 g 200 mL/hr over 30 Minutes Intravenous Every 24 hours 09/18/19 0855     09/18/19 0845  meropenem (MERREM) 1 g in sodium chloride 0.9 % 100 mL IVPB  Status:  Discontinued     1 g 200 mL/hr over 30 Minutes Intravenous Every 8 hours 09/18/19 0830 09/18/19 0846   09/16/19 1000  remdesivir 100 mg in sodium chloride 0.9 % 100 mL IVPB  Status:  Discontinued     100 mg 200 mL/hr over 30 Minutes Intravenous Daily 09/15/19 0054 09/15/19 0059   09/16/19 1000  remdesivir 100 mg in sodium chloride 0.9 % 100 mL IVPB     100 mg 200 mL/hr over 30 Minutes Intravenous Daily 09/15/19 0022 09/20/19 0959   09/15/19 2200  cefTRIAXone (ROCEPHIN) 1 g in sodium chloride 0.9 % 100 mL IVPB  Status:  Discontinued     1 g 200 mL/hr over 30 Minutes Intravenous Every 24 hours 09/15/19 0054 09/15/19 1010   09/15/19 1100  meropenem (MERREM) 1 g in sodium chloride 0.9 % 100 mL IVPB  Status:  Discontinued     1 g 200 mL/hr over 30 Minutes Intravenous Every 12 hours 09/15/19 1049 09/18/19 0830   09/15/19 0100  remdesivir 200 mg in sodium chloride 0.9% 250 mL IVPB     200 mg 580 mL/hr over 30 Minutes Intravenous Once 09/15/19 0022 09/15/19 0245   09/15/19 0054  remdesivir 200 mg in sodium chloride 0.9% 250 mL IVPB  Status:  Discontinued     200 mg 580 mL/hr over 30 Minutes Intravenous Once 09/15/19 0054 09/15/19 0059   09/14/19 1900  cefTRIAXone (ROCEPHIN) 1 g in sodium chloride 0.9 % 100 mL IVPB     1 g 200 mL/hr over 30 Minutes Intravenous  Once 09/14/19 1858 09/14/19 2142       Subjective: No fever, patient continued to be able to track me while entering the room and is trying to articulate words with his mouth.  Unknown baseline but definitely more interactive.  No nausea, no vomiting, using only 3 L nasal cannula supplementation currently.  Objective: Vitals:   09/18/19 0645 09/18/19 0700 09/18/19 0715 09/18/19 0735  BP: (!) 94/55 (!) 86/51 (!) 93/55   Pulse: 96 98 91 (!) 103  Resp: 16 19 16 17   Temp:    98.5 F (36.9 C)  TempSrc:    Axillary  SpO2: 99% 98% 99% 99%  Weight:      Height:        Intake/Output Summary (Last 24 hours) at 09/18/2019 0914 Last data filed at 09/18/2019 0400 Gross per 24 hour  Intake 6451.61 ml  Output 2400 ml  Net 4051.61 ml   Filed Weights   09/15/19 1851 09/17/19 0500 09/18/19 0400  Weight: 107.2 kg 107.9 kg 111.1 kg    Examination: General exam: Afebrile currently not demonstrating chest pain or abdominal pain.  Continue to track me when entering room and try to articulate with his mouth and responses to questions/interaction.  No nausea vomiting.  No signs of overt bleeding.  Good urine output reported. Respiratory system: Decreased breath sounds at the bases, no  using accessory muscles, no wheezing, no frank crackles and currently no using accessory muscle.  Patient is using 3 L nasal cannula supplementation with good O2 sat. Cardiovascular system: Irregular, no rubs, no gallops, no JVD appreciated on exam.   Gastrointestinal system: Abdomen is nondistended, soft and nontender. No organomegaly or masses felt.  Positive bowel sounds and PEG tube in place. Central nervous system: Nonverbal and bedbound at baseline; no new neurologic deficit appreciated. Extremities: No cyanosis/clubbing; no lower extremity edema. Skin: No rashes, no petechiae, positive prior to admission stage III sacrum pressure injury appreciated on exam. Psychiatry: Mood appears to be stable.  Data Reviewed: I have personally reviewed following  labs and imaging studies  CBC: Recent Labs  Lab 09/14/19 1551 09/15/19 0350 09/16/19 0842 09/17/19 0429 09/18/19 0420  WBC 3.5* 4.7 36.0* 31.3* 19.0*  NEUTROABS 1.7 4.2 28.8* 28.5* 16.9*  HGB 13.6 11.9* 13.8 12.4* 11.2*  HCT 43.4 39.0 42.7 38.8* 34.5*  MCV 97.5 97.5 94.9 94.6 94.8  PLT 263 170 108* 88* 64*   Basic Metabolic Panel: Recent Labs  Lab 09/14/19 1551 09/15/19 0350 09/16/19 0842 09/17/19 0429 09/18/19 0420  NA 147* 145 146* 146* 148*  K 3.4* 2.6* 3.5 3.0* 2.8*  CL 105 112* 115* 113* 116*  CO2 31 19* 19* 19* 22  GLUCOSE 99 115* 128* 242* 202*  BUN 24* 27* 53* 57* 62*  CREATININE 1.57* 2.29* 2.34* 1.95* 1.55*  CALCIUM 8.7* 7.4* 7.3* 7.6* 8.0*  MG  --  1.5*  --   --   --    GFR: Estimated Creatinine Clearance: 56.3 mL/min (A) (by C-G formula based on SCr of 1.55 mg/dL (H)).   Liver Function Tests: Recent Labs  Lab 09/14/19 1551 09/15/19 0350 09/16/19 0842 09/17/19 0429 09/18/19 0420  AST 21 48* 75* 66* 62*  ALT 5 21 24 22 19   ALKPHOS 88 97 100 95 102  BILITOT 0.6 1.7* 1.0 0.7 0.5  PROT 7.8 6.1* 6.4* 5.9* 5.4*  ALBUMIN 3.5 2.8* 2.7* 2.4* 2.1*     Anemia Panel: Recent Labs    09/17/19 0429 09/18/19 0420  FERRITIN 2,385* 1,397*   Urine analysis:    Component Value Date/Time   COLORURINE YELLOW 09/14/2019 1735   APPEARANCEUR HAZY (A) 09/14/2019 1735   LABSPEC 1.011 09/14/2019 1735   PHURINE 6.0 09/14/2019 1735   GLUCOSEU NEGATIVE 09/14/2019 1735   HGBUR LARGE (A) 09/14/2019 1735   BILIRUBINUR NEGATIVE 09/14/2019 1735   KETONESUR NEGATIVE 09/14/2019 1735   PROTEINUR NEGATIVE 09/14/2019 1735   UROBILINOGEN 0.2 12/26/2013 0817   NITRITE POSITIVE (A) 09/14/2019 1735   LEUKOCYTESUR SMALL (A) 09/14/2019 1735    Recent Results (from the past 240 hour(s))  Urine culture     Status: Abnormal   Collection Time: 09/14/19  5:35 PM   Specimen: Urine, Random  Result Value Ref Range Status   Specimen Description   Final    URINE,  RANDOM Performed at [facility], [address] [place], [place] [address]    Special Requests   Final    NONE Performed at [facility], [address], [place], [place] [address]    Culture >=100,000 COLONIES/mL ESCHERICHIA COLI (A)  Final   Report Status 09/17/2019 FINAL  Final   Organism ID, Bacteria ESCHERICHIA COLI (A)  Final      Susceptibility   Escherichia coli - MIC*    AMPICILLIN <=2 SENSITIVE Sensitive     CEFAZOLIN <=4 SENSITIVE Sensitive     CEFTRIAXONE <=0.25 SENSITIVE  Sensitive     CIPROFLOXACIN <=0.25 SENSITIVE Sensitive     GENTAMICIN <=1 SENSITIVE Sensitive     IMIPENEM <=0.25 SENSITIVE Sensitive     NITROFURANTOIN <=16 SENSITIVE Sensitive     TRIMETH/SULFA <=20 SENSITIVE Sensitive     AMPICILLIN/SULBACTAM <=2 SENSITIVE Sensitive     PIP/TAZO <=4 SENSITIVE Sensitive     * >=100,000 COLONIES/mL ESCHERICHIA COLI  Culture, blood (routine x 2)     Status: Abnormal   Collection Time: 09/14/19  8:48 PM   Specimen: BLOOD RIGHT ARM  Result Value Ref Range Status   Specimen Description   Final    BLOOD RIGHT ARM Performed at [facility], [address], [place], [place] [address]    Special Requests   Final    BOTTLES DRAWN AEROBIC AND ANAEROBIC Blood Culture adequate volume Performed at [facility], [address] [place], [place] [address]    Culture  Setup Time   Final    GRAM NEGATIVE RODS IN BOTH AEROBIC AND ANAEROBIC BOTTLES Gram Stain Report Called to,Read Back By and Verified With: MARTIN D. AT 0950A ON [id number] BY THOMPSON S. CRITICAL RESULT CALLED TO, READ BACK BY AND VERIFIED WITH: G. Coffee PharmD 15:35 09/15/19 (wilsonm) Performed at [facility], [place] [address] [place], [place] [address]    Culture ESCHERICHIA COLI (A)  Final   Report Status 09/18/2019 FINAL  Final   Organism ID, Bacteria ESCHERICHIA COLI  Final      Susceptibility   Escherichia coli - MIC*    AMPICILLIN <=2 SENSITIVE Sensitive     CEFAZOLIN <=4 SENSITIVE Sensitive      CEFEPIME <=0.12 SENSITIVE Sensitive     CEFTAZIDIME <=1 SENSITIVE Sensitive     CEFTRIAXONE <=0.25 SENSITIVE Sensitive     CIPROFLOXACIN <=0.25 SENSITIVE Sensitive     GENTAMICIN <=1 SENSITIVE Sensitive     IMIPENEM <=0.25 SENSITIVE Sensitive     TRIMETH/SULFA <=20 SENSITIVE Sensitive     AMPICILLIN/SULBACTAM <=2 SENSITIVE Sensitive     PIP/TAZO <=4 SENSITIVE Sensitive     * ESCHERICHIA COLI  Blood Culture ID Panel (Reflexed)     Status: Abnormal   Collection Time: 09/14/19  8:48 PM  Result Value Ref Range Status   Enterococcus species NOT DETECTED NOT DETECTED Final   Listeria monocytogenes NOT DETECTED NOT DETECTED Final   Staphylococcus species NOT DETECTED NOT DETECTED Final   Staphylococcus aureus (BCID) NOT DETECTED NOT DETECTED Final   Streptococcus species NOT DETECTED NOT DETECTED Final   Streptococcus agalactiae NOT DETECTED NOT DETECTED Final   Streptococcus pneumoniae NOT DETECTED NOT DETECTED Final   Streptococcus pyogenes NOT DETECTED NOT DETECTED Final   Acinetobacter baumannii NOT DETECTED NOT DETECTED Final   Enterobacteriaceae species DETECTED (A) NOT DETECTED Final    Comment: Enterobacteriaceae represent a large family of gram-negative bacteria, not a single organism. CRITICAL RESULT CALLED TO, READ BACK BY AND VERIFIED WITH: G. Coffee PharmD 15:35 09/15/19 (wilsonm)    Enterobacter cloacae complex NOT DETECTED NOT DETECTED Final   Escherichia coli DETECTED (A) NOT DETECTED Final    Comment: CRITICAL RESULT CALLED TO, READ BACK BY AND VERIFIED WITH: G. Coffee PharmD 15:35 09/15/19 (wilsonm)    Klebsiella oxytoca NOT DETECTED NOT DETECTED Final   Klebsiella pneumoniae NOT DETECTED NOT DETECTED Final   Proteus species NOT DETECTED NOT DETECTED Final   Serratia marcescens NOT DETECTED NOT DETECTED Final   Carbapenem resistance NOT DETECTED NOT DETECTED Final   Haemophilus influenzae NOT DETECTED NOT DETECTED Final  Neisseria meningitidis NOT DETECTED NOT DETECTED  Final   Pseudomonas aeruginosa NOT DETECTED NOT DETECTED Final   Candida albicans NOT DETECTED NOT DETECTED Final   Candida glabrata NOT DETECTED NOT DETECTED Final   Candida krusei NOT DETECTED NOT DETECTED Final   Candida parapsilosis NOT DETECTED NOT DETECTED Final   Candida tropicalis NOT DETECTED NOT DETECTED Final    Comment: Performed at [facility], [place] [address], [place], [place] [address]  Respiratory Panel by RT PCR (Flu A&B, Covid) - Nasopharyngeal Swab     Status: Abnormal   Collection Time: 09/15/19  2:30 AM   Specimen: Nasopharyngeal Swab  Result Value Ref Range Status   SARS Coronavirus 2 by RT PCR POSITIVE (A) NEGATIVE Final    Comment: CRITICAL RESULT CALLED TO, READ BACK BY AND VERIFIED WITH: L ANDREWS,RN @0437  09/15/19 MKELLY (NOTE) SARS-CoV-2 target nucleic acids are DETECTED. SARS-CoV-2 RNA is generally detectable in upper respiratory specimens  during the acute phase of infection. Positive results are indicative of the presence of the identified virus, but do not rule out bacterial infection or co-infection with other pathogens not detected by the test. Clinical correlation with patient history and other diagnostic information is necessary to determine patient infection status. The expected result is Negative. Fact Sheet for Patients:  PinkCheek.be Fact Sheet for Healthcare Providers: GravelBags.it This test is not yet approved or cleared by the [place] FDA and  has been authorized for detection and/or diagnosis of SARS-CoV-2 by FDA under an Emergency Use Authorization (EUA).  This EUA will remain in effect (meaning this test can be  used) for the duration of  the COVID-19 declaration under Section 564(b)(1) of the Act, 21 U.S.C. section 360bbb-3(b)(1), unless the authorization is terminated or revoked sooner.    Influenza A by PCR NEGATIVE NEGATIVE Final   Influenza B by PCR NEGATIVE  NEGATIVE Final    Comment: (NOTE) The Xpert Xpress SARS-CoV-2/FLU/RSV assay is intended as an aid in  the diagnosis of influenza from Nasopharyngeal swab specimens and  should not be used as a sole basis for treatment. Nasal washings and  aspirates are unacceptable for Xpert Xpress SARS-CoV-2/FLU/RSV  testing. Fact Sheet for Patients: PinkCheek.be Fact Sheet for Healthcare Providers: GravelBags.it This test is not yet approved or cleared by the [place] FDA and  has been authorized for detection and/or diagnosis of SARS-CoV-2 by  FDA under an Emergency Use Authorization (EUA). This EUA will remain  in effect (meaning this test can be used) for the duration of the  Covid-19 declaration under Section 564(b)(1) of the Act, 21  U.S.C. section 360bbb-3(b)(1), unless the authorization is  terminated or revoked. Performed at [facility], [address] [place], [place] [address]   Culture, blood (routine x 2)     Status: None (Preliminary result)   Collection Time: 09/15/19  3:50 AM   Specimen: BLOOD  Result Value Ref Range Status   Specimen Description BLOOD RIGHT ANTECUBITAL  Final   Special Requests   Final    BOTTLES DRAWN AEROBIC AND ANAEROBIC Blood Culture adequate volume   Culture   Final    NO GROWTH 3 DAYS Performed at [facility], [address] [place], [place] [address]    Report Status PENDING  Incomplete  MRSA PCR Screening     Status: None   Collection Time: 09/16/19  7:03 AM   Specimen: Nasopharyngeal  Result Value Ref Range Status   MRSA by PCR NEGATIVE NEGATIVE Final  Comment:        The GeneXpert MRSA Assay (FDA approved for NASAL specimens only), is one component of a comprehensive MRSA colonization surveillance program. It is not intended to diagnose MRSA infection nor to guide or monitor treatment for MRSA infections. Performed at [facility], [address], [place], [place] [address]    C difficile quick scan w PCR reflex     Status: None   Collection Time: 09/17/19 10:08 AM   Specimen: STOOL  Result Value Ref Range Status   C Diff antigen NEGATIVE NEGATIVE Final   C Diff toxin NEGATIVE NEGATIVE Final   C Diff interpretation No C. difficile detected.  Final    Comment: Performed at [facility], [address] [place], [place] [address]     Radiology Studies: [place] EKG SITE RITE  Result Date: 09/16/2019 If Site Rite image not attached, placement could not be confirmed due to current cardiac rhythm.   Scheduled Meds: . apixaban  5 mg Per Tube BID  . atorvastatin  40 mg Per Tube Daily  . carbidopa-levodopa  1 tablet Per Tube TID  . chlorhexidine  15 mL Mouth Rinse BID  . Chlorhexidine Gluconate Cloth  6 each Topical Daily  . dexamethasone (DECADRON) injection  6 mg Intravenous Q24H  . donepezil  5 mg Per Tube QHS  . DULoxetine  20 mg Oral BID  . feeding supplement (PRO-STAT SUGAR FREE 64)  30 mL Per Tube Daily  . free water  300 mL Per Tube Q6H  . gabapentin  100 mg Per Tube TID  . insulin aspart  0-9 Units Subcutaneous TID WC  . insulin detemir  7 Units Subcutaneous Daily  . levETIRAcetam  500 mg Per Tube BID  . mouth rinse  15 mL Mouth Rinse q12n4p  . potassium chloride  20 mEq Oral Q4H  . QUEtiapine  50 mg Per Tube QHS  . saccharomyces boulardii  250 mg Per Tube BID  . scopolamine  1 patch Transdermal Q72H   Continuous Infusions: . cefTRIAXone (ROCEPHIN)  IV    . dextrose 5 % with KCl 20 mEq / L    . feeding supplement (OSMOLITE 1.5 CAL) 1,000 mL (09/18/19 0351)  . magnesium sulfate bolus IVPB 1 g (09/18/19 0858)  . norepinephrine (LEVOPHED) Adult infusion Stopped (09/17/19 1215)  . remdesivir 100 mg in NS 100 mL 100 mg (09/18/19 0900)     LOS: 3 days    Time spent: 35 minutes.  Greater than 50% of this time was spent in direct contact with the patient, discussing with staff and coordinating his care.  Family has been updated over the phone.  Patient  prognosis continues to be guarded and long-term poor; overall improvement has been achieved with medical intervention.  Currently off Levophed, CT check and negative; E. coli B CAD demonstrating pansensitive microorganism and his medications will be adjusted/deescalated to the use of Rocephin 2 g daily.  Continue IV remdesivir and IV steroids.  Currently coursing day #4 of treatment for that.  Patient is afebrile and overall improved.  Will continue electrolytes repletion and follow trend closely.  His lactic acid is back to normal and his renal function is also at baseline now.   Barton Dubois, MD [facility] Pager [phone]   09/18/2019, 9:14 AM

## 2019-09-18 NOTE — Care Management Important Message (Signed)
Important Message  Patient Details  Name: Mark [name] MRN: [id number] Date of Birth: Apr 16, 1946   Medicare Important Message Given:  Yes(RN will deliver letter to patient due to contact precautions)     Tommy Medal 09/18/2019, 3:09 PM

## 2019-09-18 NOTE — Consult Note (Addendum)
Consultation Note Date: 09/19/19  Patient Name: Mark [name]  DOB: 01/05/1946  MRN: [id number]  Age / Sex: 74 y.o., male  PCP: Caprice Renshaw, MD Referring Physician: Barton Dubois, MD  Reason for Consultation: Establishing goals of care  HPI/Patient Profile: 74 y.o. male  with past medical history of advanced Parkinson's disease, dementia, CAD, HTN, paroxysmal atrial fibrillation on apixaban, CKD stage 3, seizures, diagnoses COVID 09/09/18 admitted on 09/14/2019 with altered mental status, decreased po intake (receives nutrition/hydration via PEG), and generalized weakness. Found to have UTI in ED and then became hypotensive requiring oxygen and admitted to ICU with septic shock and required transient vasopressors. Also found to have bacteremia and has sacral wound stage 3.   Clinical Assessment and Goals of Care: Discussed case with bedside RN as well as Dr. Dyann Kief. Patient is contracted and only tracking. Unable to communicate needs.   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 He and his wife were high school sweethearts but have have previous marriages that both ended in divorce and then they reconnected. PEG was placed October 2019 and appears that Mr. Madore was able to participate in discussion at that time.   I called and was able to speak with wife, Librado Cuautle. She is frustrated as she is unable to visit with her husband and otherwise is with him daily. She is frustrated as she cannot see how his is herself and feels that she receives mixed messages from staff at times. I clarified that he is progressing and stable from an infection standpoint. However our largest concern is for his overall debilitated condition that will only decline and lead to more infections and complications. We know that eventually this will lead to a terminal event when the his body will be unable to  overcome the infection. She understands. I expressed my concern for his QOL and dignity in his current state and how we move forward with his care. She confirms that he is a DNR as this is his wish but is clear that she wants aggressive care for him up to the point that he "stops breathing." She also confirms that she believes this would be what he would want.   All questions/concerns addressed. Emotional support provided.   Primary Decision Maker NEXT OF KIN wife [name]    SUMMARY OF RECOMMENDATIONS   - Continue full aggressive care outside DNR - Staff continue to reiterate to him that wife is unable to visit due to [place] visitor restrictions but still calls to check on him and misses him  Code Status/Advance Care Planning:  DNR   Symptom Management:   Per attending  Palliative Prophylaxis:   Aspiration, Bowel Regimen, Delirium Protocol, Frequent Pain Assessment, Oral Care, Palliative Wound Care and Turn Reposition  Additional Recommendations (Limitations, Scope, Preferences):  Full Scope Treatment  Psycho-social/Spiritual:   Desire for further Chaplaincy support:no  Additional Recommendations: Caregiving  Support/Resources  Prognosis:   Overall prognosis poor with severe debility and overall failure to thrive. Would be  eligible for hospice if family desired.   Discharge Planning:  Return to SNF      Primary Diagnoses: Present on Admission: . Altered mental status . Acute respiratory failure with hypoxemia ([place]) . Severe sepsis with septic shock (CODE) ([place])   I have reviewed the medical record, interviewed the patient and family, and examined the patient. The following aspects are pertinent.  Past Medical History:  Diagnosis Date  . Arthritis   . Bacteremia due to Escherichia coli 06/21/2012  . Cervical vertebral fracture ([place])   . Coronary artery disease    SEVERE 3-VESSEL DX WITH NORMAL EF  . Fall   . Fracture of right olecranon process 10/23/2011  .  Gout   . Hypertension   . Kidney stones   . Melanoma ([place])   . MI (myocardial infarction) ([place]) 08/2011   /H&P (05/12/2012)  . Non-ST elevated myocardial infarction (non-STEMI) ([place])   . PAF (paroxysmal atrial fibrillation) ([place])   . Parkinson's disease   . 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 Adenoma.  . Pyelonephritis, acute 06/23/2012  . Seizures ([place]) 12/10/2011   HAD SEIZURE  . Skin cancer    left shoulder  . Sleep apnea    Social History   Socioeconomic History  . Marital status: Married    Spouse name: Not on file  . Number of children: 1  . Years of education: Bachelors  . Highest education level: Not on file  Occupational History  . Occupation: Disabled  Tobacco Use  . Smoking status: Never Smoker  . Smokeless tobacco: Never Used  Substance and Sexual Activity  . Alcohol use: No    Alcohol/week: 0.0 standard drinks  . Drug use: No  . Sexual activity: Yes  Other Topics Concern  . Not on file  Social History Narrative   Lives at [facility] in [place].   Right-handed.   No caffeine use.   Social Determinants of Health   Financial Resource Strain:   . Difficulty of Paying Living Expenses: Not on file  Food Insecurity:   . Worried About Charity fundraiser in the Last Year: Not on file  . Ran Out of Food in the Last Year: Not on file  Transportation Needs:   . Lack of Transportation (Medical): Not on file  . Lack of Transportation (Non-Medical): Not on file  Physical Activity:   . Days of Exercise per Week: Not on file  . Minutes of Exercise per Session: Not on file  Stress:   . Feeling of Stress : Not on file  Social Connections:   . Frequency of Communication with Friends and Family: Not on file  . Frequency of Social Gatherings with Friends and Family: Not on file  . Attends Religious Services: Not on file  . Active Member of Clubs or Organizations: Not on file  . Attends Archivist Meetings: Not on file  . Marital Status: Not on file    Family History  Problem Relation Age of Onset  . Heart attack Father   . Heart disease Other   . Arthritis Other   . Colon cancer Neg Hx   . Liver disease Neg Hx    Scheduled Meds: . apixaban  5 mg Per Tube BID  . atorvastatin  40 mg Per Tube Daily  . carbidopa-levodopa  1 tablet Per Tube TID  . chlorhexidine  15 mL Mouth Rinse BID  . Chlorhexidine Gluconate Cloth  6 each Topical Daily  . dexamethasone (DECADRON) injection  6 mg Intravenous Q24H  . donepezil  5 mg Per Tube QHS  . DULoxetine  20 mg Oral BID  . feeding supplement (PRO-STAT SUGAR FREE 64)  30 mL Per Tube Daily  . free water  300 mL Per Tube Q6H  . gabapentin  100 mg Per Tube TID  . insulin aspart  0-9 Units Subcutaneous TID WC  . insulin detemir  7 Units Subcutaneous Daily  . levETIRAcetam  500 mg Per Tube BID  . mouth rinse  15 mL Mouth Rinse q12n4p  . potassium chloride  20 mEq Oral Q4H  . QUEtiapine  50 mg Per Tube QHS  . saccharomyces boulardii  250 mg Per Tube BID  . scopolamine  1 patch Transdermal Q72H   Continuous Infusions: . cefTRIAXone (ROCEPHIN)  IV    . dextrose 5 % with KCl 20 mEq / L    . feeding supplement (OSMOLITE 1.5 CAL) 1,000 mL (09/18/19 0351)  . norepinephrine (LEVOPHED) Adult infusion Stopped (09/17/19 1215)  . remdesivir 100 mg in NS 100 mL 100 mg (09/18/19 0900)   PRN Meds:.acetaminophen, metoprolol tartrate, ondansetron **OR** ondansetron (ZOFRAN) IV Allergies  Allergen Reactions  . Keppra [Levetiracetam] Other (See Comments)    Increased agitation 09/17/2019-> patient tolerating 500mg  per tube bid  . Ciprofloxacin Rash   Review of Systems  Unable to perform ROS: Acuity of condition    Physical Exam Vitals and nursing note reviewed.   Discussed assessment with RN - no acute concerns. He will not consistently track, does not follow commands. He is sleeping without distress as seen via glass door at time of my assessment.   Vital Signs: BP (!) 93/55   Pulse (!) 103    Temp 98.5 F (36.9 C) (Axillary)   Resp 17   Ht 6\' 2"  (1.88 m)   Wt 111.1 kg   SpO2 99%   BMI 31.45 kg/m  Pain Scale: PAINAD POSS *See Group Information*: 1-Acceptable,Awake and alert Pain Score: 0-No pain   SpO2: SpO2: 99 % O2 Device:SpO2: 99 % O2 Flow Rate: .O2 Flow Rate (L/min): 2 L/min  IO: Intake/output summary:   Intake/Output Summary (Last 24 hours) at 09/18/2019 1013 Last data filed at 09/18/2019 0400 Gross per 24 hour  Intake 6451.61 ml  Output 1750 ml  Net 4701.61 ml    LBM: Last BM Date: 09/17/19 Baseline Weight: Weight: 102.8 kg Most recent weight: Weight: 111.1 kg     Palliative Assessment/Data:     Time In/Out: 1100-1130, 1500-1520 Time Total: 50 min Greater than 50%  of this time was spent counseling and coordinating care related to the above assessment and plan.  Signed by: Vinie Sill, NP Palliative Medicine Team Pager # [phone] (M-F 8a-5p) Team Phone # [phone] (Nights/Weekends)    The above conversation was completed via telephone due to the visitor restrictions during the COVID-19 pandemic. Thorough chart review and discussion with necessary members of the care team was completed as part of assessment. All issues were discussed and addressed but no physical exam was performed.

## 2019-09-19 DIAGNOSIS — Z515 Encounter for palliative care: Secondary | ICD-10-CM

## 2019-09-19 DIAGNOSIS — Z7189 Other specified counseling: Secondary | ICD-10-CM

## 2019-09-19 DIAGNOSIS — R627 Adult failure to thrive: Secondary | ICD-10-CM

## 2019-09-19 LAB — CBC WITH DIFFERENTIAL/PLATELET
Abs Immature Granulocytes: 0.13 10*3/uL — ABNORMAL HIGH (ref 0.00–0.07)
Basophils Absolute: 0 10*3/uL (ref 0.0–0.1)
Basophils Relative: 0 %
Eosinophils Absolute: 0 10*3/uL (ref 0.0–0.5)
Eosinophils Relative: 0 %
HCT: 31.6 % — ABNORMAL LOW (ref 39.0–52.0)
Hemoglobin: 10.2 g/dL — ABNORMAL LOW (ref 13.0–17.0)
Immature Granulocytes: 1 %
Lymphocytes Relative: 8 %
Lymphs Abs: 1 10*3/uL (ref 0.7–4.0)
MCH: 30.9 pg (ref 26.0–34.0)
MCHC: 32.3 g/dL (ref 30.0–36.0)
MCV: 95.8 fL (ref 80.0–100.0)
Monocytes Absolute: 0.7 10*3/uL (ref 0.1–1.0)
Monocytes Relative: 6 %
Neutro Abs: 10.6 10*3/uL — ABNORMAL HIGH (ref 1.7–7.7)
Neutrophils Relative %: 85 %
Platelets: 52 10*3/uL — ABNORMAL LOW (ref 150–400)
RBC: 3.3 MIL/uL — ABNORMAL LOW (ref 4.22–5.81)
RDW: 15.9 % — ABNORMAL HIGH (ref 11.5–15.5)
WBC: 12.5 10*3/uL — ABNORMAL HIGH (ref 4.0–10.5)
nRBC: 0 % (ref 0.0–0.2)

## 2019-09-19 LAB — COMPREHENSIVE METABOLIC PANEL
ALT: 53 U/L — ABNORMAL HIGH (ref 0–44)
AST: 112 U/L — ABNORMAL HIGH (ref 15–41)
Albumin: 2.1 g/dL — ABNORMAL LOW (ref 3.5–5.0)
Alkaline Phosphatase: 121 U/L (ref 38–126)
Anion gap: 7 (ref 5–15)
BUN: 61 mg/dL — ABNORMAL HIGH (ref 8–23)
CO2: 22 mmol/L (ref 22–32)
Calcium: 8.1 mg/dL — ABNORMAL LOW (ref 8.9–10.3)
Chloride: 116 mmol/L — ABNORMAL HIGH (ref 98–111)
Creatinine, Ser: 1.33 mg/dL — ABNORMAL HIGH (ref 0.61–1.24)
GFR calc Af Amer: >60 mL/min (ref >60)
GFR calc non Af Amer: 53 mL/min — ABNORMAL LOW (ref >60)
Glucose, Bld: 278 mg/dL — ABNORMAL HIGH (ref 70–99)
Potassium: 3.7 mmol/L (ref 3.5–5.1)
Sodium: 145 mmol/L (ref 135–145)
Total Bilirubin: 0.5 mg/dL (ref 0.3–1.2)
Total Protein: 5.3 g/dL — ABNORMAL LOW (ref 6.5–8.1)

## 2019-09-19 LAB — GLUCOSE, CAPILLARY
Glucose-Capillary: 214 mg/dL — ABNORMAL HIGH (ref 70–99)
Glucose-Capillary: 246 mg/dL — ABNORMAL HIGH (ref 70–99)
Glucose-Capillary: 247 mg/dL — ABNORMAL HIGH (ref 70–99)
Glucose-Capillary: 274 mg/dL — ABNORMAL HIGH (ref 70–99)
Glucose-Capillary: 289 mg/dL — ABNORMAL HIGH (ref 70–99)

## 2019-09-19 LAB — D-DIMER, QUANTITATIVE: D-Dimer, Quant: 0.73 ug/mL-FEU — ABNORMAL HIGH (ref 0.00–0.50)

## 2019-09-19 LAB — C-REACTIVE PROTEIN: CRP: 10.6 mg/dL — ABNORMAL HIGH (ref <1.0)

## 2019-09-19 LAB — FERRITIN: Ferritin: 686 ng/mL — ABNORMAL HIGH (ref 24–336)

## 2019-09-19 MED ORDER — INSULIN ASPART 100 UNIT/ML ~~LOC~~ SOLN
0.0000 [IU] | SUBCUTANEOUS | Status: DC
  Administered 2019-09-19 (×2): 3 [IU] via SUBCUTANEOUS
  Administered 2019-09-19: 5 [IU] via SUBCUTANEOUS
  Administered 2019-09-20 (×2): 3 [IU] via SUBCUTANEOUS
  Administered 2019-09-20: 2 [IU] via SUBCUTANEOUS
  Administered 2019-09-20: 08:00:00 3 [IU] via SUBCUTANEOUS
  Administered 2019-09-20 – 2019-09-21 (×5): 2 [IU] via SUBCUTANEOUS

## 2019-09-19 MED ORDER — METOPROLOL TARTRATE 25 MG PO TABS
12.5000 mg | ORAL_TABLET | Freq: Two times a day (BID) | ORAL | Status: DC
  Administered 2019-09-19 – 2019-09-21 (×4): 12.5 mg via ORAL
  Filled 2019-09-19 (×4): qty 1

## 2019-09-19 NOTE — Progress Notes (Signed)
PROGRESS NOTE    Mark [name]  [id number] DOB: 05-18-1946 DOA: 09/14/2019 PCP: Caprice Renshaw, MD     Brief Narrative:  74 y.o. male, with history of hypertension, CAD, paroxysmal atrial fibrillation, advanced Parkinson disease, dementia, paroxysmal atrial fibrillation on apixaban, hypertension, CKD stage III, history of seizures who was diagnosed with COVID-19 infection on 09/10/2019.  Presented from nursing home with 2-day history of reduced responsiveness and poor p.o. intake.  Patient gets most of his nutrition and fluid through the feeding tube.  There is no history of nausea vomiting or diarrhea.  He does have history of frequent UTIs.  Patient has been less verbal as per patient's wife.  Patient usually not able to carry conversation at baseline. In the ED patient was found to have UTI, and was going to be discharged home.  Patient became hypotensive in the ED, he was given Rocephin and IV fluid boluses.  Currently he is requiring 4 L/min of oxygen.  He is unresponsive and unable to provide any history.    Assessment & Plan: 1-acute resp failure with hypoxia due to COVID PNA -requiring non-rebreathr initially -down to 1-2 L currently -will complete last dose of IV remdesivir today; continue IV steroids for another 24 hours and attempt to wean off oxygen supplementation.  Patient can be discharge on a steroids through PEG tube to complete 10 days total, as per Covid protocol. -continue supportive care -continue to follow inflammatory markers  2-severe sepsis with septic shock -due to E, coli UTI/bactermia -Per culture results microorganism is pansensitive. -continue IV antibiotics, but will transition to Rocephin) to de-escalate based on sensitivity. -Continue supportive care. -patient with resp distress and renal failure as organ dysfunctions on presentation. -WBC's trending down appropriately and patient is afebrile. -Anticipate discharge back to nursing home in the next 24-48  hours with anticipated total 10 days of antibiotics. (currently coursing day 5/10)  3-lactic acidosis -due to sepsis -After fluid resuscitation lactic acid is within normal limits.  4-chronic paroxysmal a. Fib  -continue apixaban for now -anticoagulation might need to be discontinue and transitioned to heparin if renal function worsens further. -As needed IV Lopressor while overall recovering hypotension -Positive atrial fibrillation appreciated on telemetry.  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 -Sodium stable. -Continue to follow electrolytes trend  6-Altered mental status -in the setting of metabolic encephalopathy -patient with underlying hx of dementia and parkinson's -continue constant reorientation -non verbal at baseline -appears to be tracking today and is more awake/interactive.  7-parkinson's dementia -continue sinemet -patient meds and nutrition given through PEG  8-hx of seizure -will continue keppra -so far well tolerated. -no seizure appreciated.   9-DNR -patient DNR/DNI prior to admission -long discussion about prognosis and limited response so far with family -will continue to treat what is treatable -Palliative care consulted for advance directives. -poor long-term prognosis   10-stage 3 sacrum pressure injury -continue preventive measures -Pressure injury was present prior to admission.   -Patient is bed-bound at baseline  11-diarrhea -flexiseal in place to protect skin integrity; patient with stage 3 sacrum pressure injury already. -C. difficile negative; continue the use of Florastor. -patient on chronic tube feeding and on antibiotics. -Improved.   DVT prophylaxis: on apixaban Code Status: DNR/DNI Family  Communication: wife over the phone Disposition Plan: remains in ICU, continue IV  sterodis, IV remdesivir and IV antibiotics.  Successfully weaned off Levophed at this moment.  Lactic acid is resolved.  Continue repleting electrolytes and providing supportive care.  Prognosis remains guarded and long-term is poor..  Consultants:   Palliative care  intensivist curbside (Dr. Elsworth Soho) on 09/15/19; continue current management.   Procedures:   See below for x-ray reports.   Antimicrobials:  Anti-infectives (From admission, onward)   Start     Dose/Rate Route Frequency Ordered Stop   09/18/19 0900  cefTRIAXone (ROCEPHIN) 2 g in sodium chloride 0.9 % 100 mL IVPB     2 g 200 mL/hr over 30 Minutes Intravenous Every 24 hours 09/18/19 0855     09/18/19 0845  meropenem (MERREM) 1 g in sodium chloride 0.9 % 100 mL IVPB  Status:  Discontinued     1 g 200 mL/hr over 30 Minutes Intravenous Every 8 hours 09/18/19 0830 09/18/19 0846   09/16/19 1000  remdesivir 100 mg in sodium chloride 0.9 % 100 mL IVPB  Status:  Discontinued     100 mg 200 mL/hr over 30 Minutes Intravenous Daily 09/15/19 0054 09/15/19 0059   09/16/19 1000  remdesivir 100 mg in sodium chloride 0.9 % 100 mL IVPB     100 mg 200 mL/hr over 30 Minutes Intravenous Daily 09/15/19 0022 09/19/19 1049   09/15/19 2200  cefTRIAXone (ROCEPHIN) 1 g in sodium chloride 0.9 % 100 mL IVPB  Status:  Discontinued     1 g 200 mL/hr over 30 Minutes Intravenous Every 24 hours 09/15/19 0054 09/15/19 1010   09/15/19 1100  meropenem (MERREM) 1 g in sodium chloride 0.9 % 100 mL IVPB  Status:  Discontinued     1 g 200 mL/hr over 30 Minutes Intravenous Every 12 hours 09/15/19 1049 09/18/19 0830   09/15/19 0100  remdesivir 200 mg in sodium chloride 0.9% 250 mL IVPB     200 mg 580 mL/hr over 30 Minutes Intravenous Once 09/15/19 0022 09/15/19 0245   09/15/19 0054  remdesivir 200 mg in sodium chloride 0.9% 250 mL IVPB  Status:  Discontinued     200 mg 580 mL/hr over  30 Minutes Intravenous Once 09/15/19 0054 09/15/19 0059   09/14/19 1900  cefTRIAXone (ROCEPHIN) 1 g in sodium chloride 0.9 % 100 mL IVPB     1 g 200 mL/hr over 30 Minutes Intravenous  Once 09/14/19 1858 09/14/19 2142      Subjective: Afebrile, overall stable and improved in comparison to presentation at time of admission.  Patient is able to track me inside the room but is nonverbal.   Objective: Vitals:   09/19/19 1500 09/19/19 1600 09/19/19 1642 09/19/19 1700  BP: 112/65 129/72  125/76  Pulse: 96 88 90 94  Resp: 17 16 (!) 22 18  Temp:   98.2 F (36.8 C)   TempSrc:   Axillary   SpO2: 98% 100% 100% 100%  Weight:      Height:        Intake/Output Summary (Last 24 hours) at 09/19/2019 1801 Last data filed at 09/19/2019 1521 Gross per 24 hour  Intake 1945.24 ml  Output 1500 ml  Net 445.24 ml   Filed Weights   09/17/19 0500 09/18/19 0400 09/19/19 0500  Weight: 107.9 kg 111.1 kg 113.3 kg    Examination: General exam: Nonverbal, not following commands.  Tracking when I got inside the room.  In no acute distress.  Using 1-2 L nasal cannula supplementation and with a stable blood pressure.  Respiratory system: Decreased breath sounds at the bases, positive rhonchi; no using accessory muscles.  No wheezing.  Mouth breathing appreciated.  Good O2 sats on 1-2 L nasal cannula.   Cardiovascular system: Irregular, no rubs, no gallops, no JVD appreciated on exam.  Intermittent tachycardia appreciated on telemetry examination.   Gastrointestinal system: Abdomen is nondistended, soft and nontender. No organomegaly or masses felt. Normal bowel sounds heard.  PEG tube in place. Central nervous system: Alert and oriented. No focal neurological deficits. Extremities: No C/C/E, +pedal pulses Skin: No rashes, positive stage III sacral ulcer appreciated; no signs of superimposed infection. Psychiatry:  Mood & affect appropriate.    Data Reviewed: I have personally reviewed following labs and  imaging studies  CBC: Recent Labs  Lab 09/15/19 0350 09/16/19 0842 09/17/19 0429 09/18/19 0420 09/19/19 0355  WBC 4.7 36.0* 31.3* 19.0* 12.5*  NEUTROABS 4.2 28.8* 28.5* 16.9* 10.6*  HGB 11.9* 13.8 12.4* 11.2* 10.2*  HCT 39.0 42.7 38.8* 34.5* 31.6*  MCV 97.5 94.9 94.6 94.8 95.8  PLT 170 108* 88* 64* 52*   Basic Metabolic Panel: Recent Labs  Lab 09/15/19 0350 09/16/19 0842 09/17/19 0429 09/18/19 0420 09/19/19 0355  NA 145 146* 146* 148* 145  K 2.6* 3.5 3.0* 2.8* 3.7  CL 112* 115* 113* 116* 116*  CO2 19* 19* 19* 22 22  GLUCOSE 115* 128* 242* 202* 278*  BUN 27* 53* 57* 62* 61*  CREATININE 2.29* 2.34* 1.95* 1.55* 1.33*  CALCIUM 7.4* 7.3* 7.6* 8.0* 8.1*  MG 1.5*  --   --   --   --    GFR: Estimated Creatinine Clearance: 66.2 mL/min (A) (by C-G formula based on SCr of 1.33 mg/dL (H)).   Liver Function Tests: Recent Labs  Lab 09/15/19 0350 09/16/19 0842 09/17/19 0429 09/18/19 0420 09/19/19 0355  AST 48* 75* 66* 62* 112*  ALT 21 24 22 19  53*  ALKPHOS 97 100 95 102 121  BILITOT 1.7* 1.0 0.7 0.5 0.5  PROT 6.1* 6.4* 5.9* 5.4* 5.3*  ALBUMIN 2.8* 2.7* 2.4* 2.1* 2.1*     Anemia Panel: Recent Labs    09/18/19 0420 09/19/19 0355  FERRITIN 1,397* 686*   Urine analysis:    Component Value Date/Time   COLORURINE YELLOW 09/14/2019 1735   APPEARANCEUR HAZY (A) 09/14/2019 1735   LABSPEC 1.011 09/14/2019 1735   PHURINE 6.0 09/14/2019 1735   GLUCOSEU NEGATIVE 09/14/2019 1735   HGBUR LARGE (A) 09/14/2019 1735   BILIRUBINUR NEGATIVE 09/14/2019 1735   KETONESUR NEGATIVE 09/14/2019 1735   PROTEINUR NEGATIVE 09/14/2019 1735   UROBILINOGEN 0.2 12/26/2013 0817   NITRITE POSITIVE (A) 09/14/2019 1735   LEUKOCYTESUR SMALL (A) 09/14/2019 1735    Recent Results (from the past 240 hour(s))  Urine culture     Status: Abnormal   Collection Time: 09/14/19  5:35 PM   Specimen: Urine, Random  Result Value Ref Range Status   Specimen Description   Final    URINE,  RANDOM Performed at [facility], [address], [place], [place] [address]    Special Requests   Final    NONE Performed at [facility], [address], [place], [place] [address]    Culture >=100,000 COLONIES/mL ESCHERICHIA COLI (A)  Final   Report Status 09/17/2019 FINAL  Final   Organism ID, Bacteria ESCHERICHIA COLI (A)  Final      Susceptibility   Escherichia coli - MIC*    AMPICILLIN <=2 SENSITIVE Sensitive     CEFAZOLIN <=4 SENSITIVE Sensitive  CEFTRIAXONE <=0.25 SENSITIVE Sensitive     CIPROFLOXACIN <=0.25 SENSITIVE Sensitive     GENTAMICIN <=1 SENSITIVE Sensitive     IMIPENEM <=0.25 SENSITIVE Sensitive     NITROFURANTOIN <=16 SENSITIVE Sensitive     TRIMETH/SULFA <=20 SENSITIVE Sensitive     AMPICILLIN/SULBACTAM <=2 SENSITIVE Sensitive     PIP/TAZO <=4 SENSITIVE Sensitive     * >=100,000 COLONIES/mL ESCHERICHIA COLI  Culture, blood (routine x 2)     Status: Abnormal   Collection Time: 09/14/19  8:48 PM   Specimen: BLOOD RIGHT ARM  Result Value Ref Range Status   Specimen Description   Final    BLOOD RIGHT ARM Performed at [facility], [address] [place], [place] [address]    Special Requests   Final    BOTTLES DRAWN AEROBIC AND ANAEROBIC Blood Culture adequate volume Performed at [facility], [address] [place] [address] [place], [place] [address]    Culture  Setup Time   Final    GRAM NEGATIVE RODS IN BOTH AEROBIC AND ANAEROBIC BOTTLES Gram Stain Report Called to,Read Back By and Verified With: MARTIN D. AT [id number] ON [id number] BY THOMPSON S. CRITICAL RESULT CALLED TO, READ BACK BY AND VERIFIED WITH: G. Coffee PharmD 15:35 09/15/19 (wilsonm) Performed at [facility], [place] [address] [place], [place] [address]    Culture ESCHERICHIA COLI (A)  Final   Report Status 09/18/2019 FINAL  Final   Organism ID, Bacteria ESCHERICHIA COLI  Final      Susceptibility   Escherichia coli - MIC*    AMPICILLIN <=2 SENSITIVE Sensitive     CEFAZOLIN <=4 SENSITIVE Sensitive      CEFEPIME <=0.12 SENSITIVE Sensitive     CEFTAZIDIME <=1 SENSITIVE Sensitive     CEFTRIAXONE <=0.25 SENSITIVE Sensitive     CIPROFLOXACIN <=0.25 SENSITIVE Sensitive     GENTAMICIN <=1 SENSITIVE Sensitive     IMIPENEM <=0.25 SENSITIVE Sensitive     TRIMETH/SULFA <=20 SENSITIVE Sensitive     AMPICILLIN/SULBACTAM <=2 SENSITIVE Sensitive     PIP/TAZO <=4 SENSITIVE Sensitive     * ESCHERICHIA COLI  Blood Culture ID Panel (Reflexed)     Status: Abnormal   Collection Time: 09/14/19  8:48 PM  Result Value Ref Range Status   Enterococcus species NOT DETECTED NOT DETECTED Final   Listeria monocytogenes NOT DETECTED NOT DETECTED Final   Staphylococcus species NOT DETECTED NOT DETECTED Final   Staphylococcus aureus (BCID) NOT DETECTED NOT DETECTED Final   Streptococcus species NOT DETECTED NOT DETECTED Final   Streptococcus agalactiae NOT DETECTED NOT DETECTED Final   Streptococcus pneumoniae NOT DETECTED NOT DETECTED Final   Streptococcus pyogenes NOT DETECTED NOT DETECTED Final   Acinetobacter baumannii NOT DETECTED NOT DETECTED Final   Enterobacteriaceae species DETECTED (A) NOT DETECTED Final    Comment: Enterobacteriaceae represent a large family of gram-negative bacteria, not a single organism. CRITICAL RESULT CALLED TO, READ BACK BY AND VERIFIED WITH: G. Coffee PharmD 15:35 09/15/19 (wilsonm)    Enterobacter cloacae complex NOT DETECTED NOT DETECTED Final   Escherichia coli DETECTED (A) NOT DETECTED Final    Comment: CRITICAL RESULT CALLED TO, READ BACK BY AND VERIFIED WITH: G. Coffee PharmD 15:35 09/15/19 (wilsonm)    Klebsiella oxytoca NOT DETECTED NOT DETECTED Final   Klebsiella pneumoniae NOT DETECTED NOT DETECTED Final   Proteus species NOT DETECTED NOT DETECTED Final   Serratia marcescens NOT DETECTED NOT DETECTED Final   Carbapenem resistance NOT DETECTED NOT DETECTED Final   Haemophilus influenzae NOT DETECTED  NOT DETECTED Final   Neisseria meningitidis NOT DETECTED NOT DETECTED  Final   Pseudomonas aeruginosa NOT DETECTED NOT DETECTED Final   Candida albicans NOT DETECTED NOT DETECTED Final   Candida glabrata NOT DETECTED NOT DETECTED Final   Candida krusei NOT DETECTED NOT DETECTED Final   Candida parapsilosis NOT DETECTED NOT DETECTED Final   Candida tropicalis NOT DETECTED NOT DETECTED Final    Comment: Performed at [facility], [place] [address], [place], [place] [address]  Respiratory Panel by RT PCR (Flu A&B, Covid) - Nasopharyngeal Swab     Status: Abnormal   Collection Time: 09/15/19  2:30 AM   Specimen: Nasopharyngeal Swab  Result Value Ref Range Status   SARS Coronavirus 2 by RT PCR POSITIVE (A) NEGATIVE Final    Comment: CRITICAL RESULT CALLED TO, READ BACK BY AND VERIFIED WITH: L ANDREWS,RN @0437  09/15/19 MKELLY (NOTE) SARS-CoV-2 target nucleic acids are DETECTED. SARS-CoV-2 RNA is generally detectable in upper respiratory specimens  during the acute phase of infection. Positive results are indicative of the presence of the identified virus, but do not rule out bacterial infection or co-infection with other pathogens not detected by the test. Clinical correlation with patient history and other diagnostic information is necessary to determine patient infection status. The expected result is Negative. Fact Sheet for Patients:  PinkCheek.be Fact Sheet for Healthcare Providers: GravelBags.it This test is not yet approved or cleared by the [place] FDA and  has been authorized for detection and/or diagnosis of SARS-CoV-2 by FDA under an Emergency Use Authorization (EUA).  This EUA will remain in effect (meaning this test can be  used) for the duration of  the COVID-19 declaration under Section 564(b)(1) of the Act, 21 U.S.C. section 360bbb-3(b)(1), unless the authorization is terminated or revoked sooner.    Influenza A by PCR NEGATIVE NEGATIVE Final   Influenza B by PCR NEGATIVE  NEGATIVE Final    Comment: (NOTE) The Xpert Xpress SARS-CoV-2/FLU/RSV assay is intended as an aid in  the diagnosis of influenza from Nasopharyngeal swab specimens and  should not be used as a sole basis for treatment. Nasal washings and  aspirates are unacceptable for Xpert Xpress SARS-CoV-2/FLU/RSV  testing. Fact Sheet for Patients: PinkCheek.be Fact Sheet for Healthcare Providers: GravelBags.it This test is not yet approved or cleared by the [place] FDA and  has been authorized for detection and/or diagnosis of SARS-CoV-2 by  FDA under an Emergency Use Authorization (EUA). This EUA will remain  in effect (meaning this test can be used) for the duration of the  Covid-19 declaration under Section 564(b)(1) of the Act, 21  U.S.C. section 360bbb-3(b)(1), unless the authorization is  terminated or revoked. Performed at [facility], [address] [place], [place] [address]   Culture, blood (routine x 2)     Status: None (Preliminary result)   Collection Time: 09/15/19  3:50 AM   Specimen: BLOOD  Result Value Ref Range Status   Specimen Description BLOOD RIGHT ANTECUBITAL  Final   Special Requests   Final    BOTTLES DRAWN AEROBIC AND ANAEROBIC Blood Culture adequate volume   Culture   Final    NO GROWTH 3 DAYS Performed at [facility], [address], [place], [place] [address]    Report Status PENDING  Incomplete  MRSA PCR Screening     Status: None   Collection Time: 09/16/19  7:03 AM   Specimen: Nasopharyngeal  Result Value Ref Range Status   MRSA by PCR NEGATIVE  NEGATIVE Final    Comment:        The GeneXpert MRSA Assay (FDA approved for NASAL specimens only), is one component of a comprehensive MRSA colonization surveillance program. It is not intended to diagnose MRSA infection nor to guide or monitor treatment for MRSA infections. Performed at [facility], [address] [place], [place] [address]    C difficile quick scan w PCR reflex     Status: None   Collection Time: 09/17/19 10:08 AM   Specimen: STOOL  Result Value Ref Range Status   C Diff antigen NEGATIVE NEGATIVE Final   C Diff toxin NEGATIVE NEGATIVE Final   C Diff interpretation No C. difficile detected.  Final    Comment: Performed at [facility], [address], [place], [place] [address]     Radiology Studies: No results found.  Scheduled Meds: . apixaban  5 mg Per Tube BID  . atorvastatin  40 mg Per Tube Daily  . carbidopa-levodopa  1 tablet Per Tube TID  . chlorhexidine  15 mL Mouth Rinse BID  . Chlorhexidine Gluconate Cloth  6 each Topical Daily  . dexamethasone (DECADRON) injection  6 mg Intravenous Q24H  . donepezil  5 mg Per Tube QHS  . DULoxetine  20 mg Oral BID  . feeding supplement (PRO-STAT SUGAR FREE 64)  30 mL Per Tube Daily  . free water  300 mL Per Tube Q6H  . gabapentin  100 mg Per Tube TID  . insulin aspart  0-9 Units Subcutaneous Q4H  . insulin detemir  7 Units Subcutaneous Daily  . levETIRAcetam  500 mg Per Tube BID  . mouth rinse  15 mL Mouth Rinse q12n4p  . QUEtiapine  50 mg Per Tube QHS  . saccharomyces boulardii  250 mg Per Tube BID  . scopolamine  1 patch Transdermal Q72H   Continuous Infusions: . cefTRIAXone (ROCEPHIN)  IV Stopped (09/19/19 0857)  . dextrose 5 % with KCl 20 mEq / L 75 mL/hr at 09/19/19 1521  . feeding supplement (OSMOLITE 1.5 CAL) 1,000 mL (09/19/19 1407)  . norepinephrine (LEVOPHED) Adult infusion Stopped (09/17/19 1215)     LOS: 4 days    Time spent: 35 minutes.  Greater than 50% of this time was spent in direct contact with the patient, discussing with staff and coordinating his care.  Family has been updated over the phone.  Patient prognosis continues to be guarded and long-term poor; overall improvement has been achieved with medical intervention.  Currently off Levophed, CT check and negative; E. coli B CAD demonstrating pansensitive microorganism and his  medications will be adjusted/deescalated to the use of Rocephin 2 g daily.  Continue IV remdesivir and IV steroids.  Currently coursing day #5 of treatment for that.  Patient is afebrile and overall improved.  Will continue electrolytes repletion and follow trend closely.  His lactic acid is back to normal and his renal function is also at baseline now.   Barton Dubois, MD [facility] Pager [phone]   09/19/2019, 6:01 PM

## 2019-09-19 NOTE — Progress Notes (Signed)
Inpatient Diabetes Program Recommendations  AACE/ADA: New Consensus Statement on Inpatient Glycemic Control (2015)  Target Ranges:  Prepandial:   less than 140 mg/dL      Peak postprandial:   less than 180 mg/dL (1-2 hours)      Critically ill patients:  140 - 180 mg/dL   Lab Results  Component Value Date   GLUCAP 274 (H) 09/19/2019   HGBA1C 5.5 09/18/2019    Review of Glycemic Control  Diabetes history: none  Current orders for Inpatient glycemic control:  Levemir 7 units Novolog 0-9 units tid  A1c 5.5% on 09/18/19 Decadron 6 mg Q24 hours BUN/Creat: 61/1.33 Osmolite 60 ml/hour  Inpatient Diabetes Program Recommendations:    -  Adjust Novolog Correction scale to Q4 hours while on tube feeds.  -   Consider Novolog 4 units Q4 hours tube feed coverage.  Thanks,  Tama Headings RN, MSN, BC-ADM Inpatient Diabetes Coordinator Team Pager [phone] (8a-5p)

## 2019-09-20 DIAGNOSIS — U071 COVID-19: Secondary | ICD-10-CM | POA: Diagnosis present

## 2019-09-20 DIAGNOSIS — J069 Acute upper respiratory infection, unspecified: Secondary | ICD-10-CM | POA: Diagnosis present

## 2019-09-20 LAB — GLUCOSE, CAPILLARY
Glucose-Capillary: 182 mg/dL — ABNORMAL HIGH (ref 70–99)
Glucose-Capillary: 197 mg/dL — ABNORMAL HIGH (ref 70–99)
Glucose-Capillary: 221 mg/dL — ABNORMAL HIGH (ref 70–99)
Glucose-Capillary: 227 mg/dL — ABNORMAL HIGH (ref 70–99)
Glucose-Capillary: 224 mg/dL — ABNORMAL HIGH (ref 70–99)
Glucose-Capillary: 189 mg/dL — ABNORMAL HIGH (ref 70–99)

## 2019-09-20 LAB — CBC
HCT: 33.1 % — ABNORMAL LOW (ref 39.0–52.0)
Hemoglobin: 10.2 g/dL — ABNORMAL LOW (ref 13.0–17.0)
MCH: 29.7 pg (ref 26.0–34.0)
MCHC: 30.8 g/dL (ref 30.0–36.0)
MCV: 96.2 fL (ref 80.0–100.0)
Platelets: 63 10*3/uL — ABNORMAL LOW (ref 150–400)
RBC: 3.44 MIL/uL — ABNORMAL LOW (ref 4.22–5.81)
RDW: 15.9 % — ABNORMAL HIGH (ref 11.5–15.5)
WBC: 10.1 10*3/uL (ref 4.0–10.5)
nRBC: 0 % (ref 0.0–0.2)

## 2019-09-20 LAB — CULTURE, BLOOD (ROUTINE X 2)
Culture: NO GROWTH
Special Requests: ADEQUATE

## 2019-09-20 MED ORDER — OXYCODONE HCL 5 MG PO TABS: 5.0000 mg | ORAL_TABLET | Freq: Three times a day (TID) | ORAL | 0 refills | Status: AC | PRN

## 2019-09-20 MED ORDER — CEPHALEXIN 500 MG PO CAPS: 500.0000 mg | ORAL_CAPSULE | Freq: Three times a day (TID) | ORAL | 0 refills | Status: AC

## 2019-09-20 MED ORDER — FUROSEMIDE 40 MG PO TABS: 40.0000 mg | ORAL_TABLET | Freq: Every day | ORAL | 0 refills | Status: AC

## 2019-09-20 MED ORDER — PREDNISONE 20 MG PO TABS: 40.0000 mg | ORAL_TABLET | Freq: Every day | ORAL | 0 refills | Status: AC

## 2019-09-20 MED ORDER — PRO-STAT SUGAR FREE PO LIQD: 30.0000 mL | Freq: Every day | ORAL | 0 refills | Status: AC

## 2019-09-20 MED ORDER — SACCHAROMYCES BOULARDII 250 MG PO CAPS: 250.0000 mg | ORAL_CAPSULE | Freq: Two times a day (BID) | ORAL | 2 refills | Status: AC

## 2019-09-20 MED ORDER — OSMOLITE 1.5 CAL PO LIQD: 1000.0000 mL | ORAL | 0 refills | Status: AC

## 2019-09-20 MED ORDER — FREE WATER: 200.0000 mL | Status: AC

## 2019-09-20 MED ORDER — POTASSIUM CHLORIDE 20 MEQ PO PACK: 20.0000 meq | PACK | Freq: Every day | ORAL | 1 refills | Status: AC

## 2019-09-20 MED ORDER — FREE WATER: 300.0000 mL | Freq: Four times a day (QID) | 1 refills | Status: AC

## 2019-09-20 MED ORDER — METOPROLOL TARTRATE 25 MG PO TABS: 12.5000 mg | ORAL_TABLET | Freq: Two times a day (BID) | ORAL | 2 refills | Status: AC

## 2019-09-20 MED ORDER — DULOXETINE HCL 20 MG PO CPEP: 20.0000 mg | ORAL_CAPSULE | Freq: Two times a day (BID) | ORAL | 3 refills | Status: AC

## 2019-09-20 MED ORDER — SCOPOLAMINE 1 MG/3DAYS TD PT72: 1.0000 | MEDICATED_PATCH | TRANSDERMAL | 0 refills | Status: AC

## 2019-09-20 NOTE — Discharge Summary (Addendum)
Mark [name], is a 74 y.o. male  DOB 06-Sep-1946  MRN [id number].  Admission date:  09/14/2019  Admitting Physician  Barton Dubois, MD  Discharge Date:  09/21/19   Primary MD  Caprice Renshaw, MD  Recommendations for primary care physician for things to follow:    1)Avoid ibuprofen/Advil/Aleve/Motrin/Goody Powders/Naproxen/BC powders/Meloxicam/Diclofenac/Indomethacin and other Nonsteroidal anti-inflammatory medications as these will make you more likely to bleed and can cause stomach ulcers, can also cause Kidney problems.   2)--Palliative care consult at  facility, patient may need to transition to hospice care if condition does not improve  3) Tube feeding Osmolite 1.5 at 60 ml/hr continuously, water flushes 300 mL every 6 hours and prostat 30 ml daily via PEG tube   Admission Diagnosis  Altered mental status [R41.82] Altered mental status, unspecified altered mental status type [R41.82] Acute respiratory failure with hypoxemia ([place]) [J96.01] Urinary tract infection in elderly patient [N39.0] Severe sepsis with septic shock (CODE) ([place]) [R65.21] COVID-19 [U07.1]   Discharge Diagnosis  Altered mental status [R41.82] Altered mental status, unspecified altered mental status type [R41.82] Acute respiratory failure with hypoxemia ([place]) [J96.01] Urinary tract infection in elderly patient [N39.0] Severe sepsis with septic shock (CODE) ([place]) [R65.21] COVID-19 [U07.1]    Principal Problem:   Acute respiratory disease due to COVID-19 virus Active Problems:   Acute respiratory failure with hypoxemia (HCC)   Altered mental status   Severe sepsis with septic shock (CODE) (HCC)      Past Medical History:  Diagnosis Date  . Arthritis   . Bacteremia due to Escherichia coli 06/21/2012  . Cervical vertebral fracture ([place])   . Coronary artery disease    SEVERE 3-VESSEL DX WITH NORMAL EF  . Fall   . Fracture  of right olecranon process 10/23/2011  . Gout   . Hypertension   . Kidney stones   . Melanoma ([place])   . MI (myocardial infarction) ([place]) 08/2011   /H&P (05/12/2012)  . Non-ST elevated myocardial infarction (non-STEMI) ([place])   . PAF (paroxysmal atrial fibrillation) ([place])   . Parkinson's disease   . 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 Adenoma.  . Pyelonephritis, acute 06/23/2012  . Seizures ([place]) 12/10/2011   HAD SEIZURE  . Skin cancer    left shoulder  . Sleep apnea     Past Surgical History:  Procedure Laterality Date  . CARDIAC CATHETERIZATION  06/26/2011   NORMAL LV SYSTOLIC FUNCTION. RCA IS LARGE AND DOMINANT  . CORONARY ANGIOPLASTY WITH STENT PLACEMENT  ~ 2000   "one that I know of"  . ESOPHAGOGASTRODUODENOSCOPY (EGD) WITH PROPOFOL N/A 06/29/2018   Procedure: ESOPHAGOGASTRODUODENOSCOPY (EGD) WITH PROPOFOL;  Surgeon: Aviva Signs, MD;  [facility];  Service: Gastroenterology;  Laterality: N/A;  . HARDWARE REMOVAL  12/29/2011   Procedure: HARDWARE REMOVAL;  Surgeon: Johnny Bridge, MD;  Location: [place] [facility];  Service: Orthopedics;  Laterality: Right;  . ORIF ELBOW FRACTURE  12/29/2011   Procedure: OPEN REDUCTION INTERNAL FIXATION (ORIF) ELBOW/OLECRANON FRACTURE;  Surgeon: Johnny Bridge, MD;  Location: [facility];  Service: Orthopedics;  Laterality: Right;  . PAROTIDECTOMY  05/12/2012   right  . PAROTIDECTOMY  05/12/2012   Procedure: PAROTIDECTOMY;  Surgeon: Rozetta Nunnery, MD;  Location: [facility];  Service: ENT;  Laterality: Right;  PAROTIDECTOMY MASS  . PEG PLACEMENT N/A 06/29/2018   Procedure: PERCUTANEOUS ENDOSCOPIC GASTROSTOMY (PEG) PLACEMENT;  Surgeon: Aviva Signs, MD;  [facility];  Service: Gastroenterology;  Laterality: N/A;  . SKIN CANCER EXCISION  2013   left shoulder  . SKIN CANCER EXCISION     right ear  . TRANSTHORACIC ECHOCARDIOGRAM  06/26/2011   EF 55-60%     HPI  from the history and physical done on the day of admission:     Mark [name]  is a 74  y.o. male, with history of hypertension, CAD, paroxysmal atrial fibrillation, advanced Parkinson disease, dementia, paroxysmal atrial fibrillation on apixaban, hypertension, CKD stage III, history of seizures who was diagnosed with COVID-19 infection on 09/10/2019.  Presented from nursing home with 2-day history of reduced responsiveness and poor p.o. intake.  Patient gets most of his nutrition and fluid through the feeding tube.  There is no history of nausea vomiting or diarrhea.  He does have history of frequent UTIs.  Patient has been less verbal as per patient's wife.  Patient usually not able to carry conversation at baseline. In the ED patient was found to have UTI, and was going to be discharged home.  Patient became hypotensive in the ED, he was given Rocephin and IV fluid boluses.  Currently he is requiring 4 L/min of oxygen.  He is unresponsive and unable to provide any history     Hospital Course:    Brief Narrative:  74 y.o.male,with history of hypertension, CAD, paroxysmal atrial fibrillation, advanced Parkinson disease, dementia, paroxysmal atrial fibrillation on apixaban, hypertension, CKD stage III, history of seizures who was diagnosed with COVID-19 infection on 09/10/2019. Presented from nursing home with 2-day history of reduced responsiveness and poor p.o. intake. Patient gets most of his nutrition and fluid through the feeding tube. There is no history of nausea vomiting or diarrhea. He does have history of frequent UTIs. Patient has been less verbal as per patient's wife. Patient usually not able to carry conversation at baseline. In the ED patient was found to have UTI, and was going to be discharged home. Patient became hypotensive in the ED, he was given Rocephin and IV fluid boluses.Currently he is requiring 4 L/min of oxygen.He was unresponsive and unable to provide any history.   -Patient was discharged to [facility] on [id number] however [facility] was unable to  pick patient up until 09/21/2019  Assessment & Plan: 1-acute resp failure with hypoxia due to COVID PNA -requiring non-rebreather initially -Hypoxia is resolved, successfully weaned off oxygen -Completed IV remdesivir   Completed  IV steroids, discharged on p.o. prednisone   2-severe sepsis with septic shock -due to E, coli UTI/bactermia -Per culture results microorganism is pansensitive. Treated with IV antibiotics including IV Rocephin, okay to discharge on Keflex via PEG tube to complete at least 10 days of antibiotic therapy  3 social/ethics --- discussed with Vinie Sill from palliative care services, official palliative care consult appreciated, also discussed with patient's wife, okay to discharge to [facility] with palliative care following  4-chronic paroxysmal a. 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 -Continue to follow electrolytes trend  6-Altered mental status/metabolic encephalopathy -patient with underlying hx of dementia and parkinson's -Patient is nonverbal, not really interactive, -Appears comfortable  7-parkinson's dementia -continue sinemet -patient meds and nutrition given through PEG  8-hx of seizure  continue keppra -no seizure appreciated.   9-DNR -patient DNR/DNI prior to admission -long discussion about prognosis and limited response so far with family -will continue to treat what is treatable -Palliative care consulted for advance directives. -poor long-term prognosis   10-stage 3 sacrum pressure injury -continue preventive measures -Pressure injury was present prior to admission.   -Patient is bed-bound at baseline  11-diarrhea -flexiseal in place to protect skin integrity; patient  with stage 3 sacrum pressure injury already. -C. difficile negative; continue the use of Florastor. -patient on chronic tube feeding and on antibiotics. -Much improved   12)FEN--- Tube feeding Osmolite 1.5 at 60 ml/hr continuously, water flushes 300 mL every 6 hours and prostat 30 ml daily via PEG tube  DVT prophylaxis: on apixaban Code Status: DNR/DNI Family Communication: wife over the phone Disposition --- discharge to [facility]   completed IV sterodis, completed IV remdesivir and completed IV antibiotics.  Successfully weaned off Levophed ,  Lactic acidosis has resolved.  Continue repleting electrolytes and providing supportive care.  Long-term prognosis remains poor  Consultants:   Palliative care  intensivist curbside (Dr. Elsworth Soho) on 09/15/19; continue current management.   Procedures:   See below for x-ray reports.   Discharge Condition: Poor prognosis overall  Follow UP--outpatient palliative care follow-up advised possible transition to hospice if continues to decline   Diet and Activity recommendation:  As advised  Discharge Instructions    Discharge Instructions    Amb Referral to Palliative Care   Complete by: As directed    --Palliative care consult at  facility, patient may need to transition to hospice care if condition does not improve   Call MD for:  persistant nausea and vomiting   Complete by: As directed    Call MD for:  severe uncontrolled pain   Complete by: As directed    Call MD for:  temperature >100.4   Complete by: As directed    Diet - low sodium heart healthy   Complete by: As directed    Discharge instructions   Complete by: As directed    Complete Keflex antibiotics and prednisone via PEG----   Discharge instructions   Complete by: As directed    1)Avoid ibuprofen/Advil/Aleve/Motrin/Goody Powders/Naproxen/BC powders/Meloxicam/Diclofenac/Indomethacin and other Nonsteroidal anti-inflammatory medications as these will make you more likely  to bleed and can cause stomach ulcers, can also cause Kidney problems.   2)--Palliative care consult at  facility, patient may need to transition to hospice care if condition does not improve  3) Tube feeding Osmolite 1.5 at 60 ml/hr continuously, water flushes 300 mL every 6 hours and prostat 30 ml daily via PEG tube   Increase activity slowly   Complete by: As directed    Increase activity slowly   Complete by: As directed         Discharge Medications     Allergies as of 09/21/2019      Reactions   Keppra [levetiracetam] Other (See Comments)   Increased agitation 09/17/2019-> patient tolerating 500mg  per tube bid   Ciprofloxacin Rash      Medication List    STOP taking these medications   carvedilol  3.125 MG tablet Commonly known as: COREG   colchicine 0.6 MG tablet   hydrochlorothiazide 12.5 MG tablet Commonly known as: HYDRODIURIL   ipratropium-albuterol 0.5-2.5 (3) MG/3ML Soln Commonly known as: DUONEB   OXYGEN   phosphorus 155-852-130 MG tablet Commonly known as: K PHOS NEUTRAL     TAKE these medications   acetaminophen 325 MG tablet Commonly known as: TYLENOL Take 650 mg by mouth every 6 (six) hours as needed for fever.   atorvastatin 40 MG tablet Commonly known as: LIPITOR Take 40 mg by mouth daily.   bisacodyl 10 MG suppository Commonly known as: DULCOLAX Place 10 mg rectally daily as needed for mild constipation or moderate constipation.   carbidopa-levodopa 25-100 MG tablet Commonly known as: SINEMET IR Take 1 tablet by mouth every 8 (eight) hours.   cephALEXin 500 MG capsule Commonly known as: Keflex Take 1 capsule (500 mg total) by mouth 3 (three) times daily for 5 days.   donepezil 5 MG tablet Commonly known as: ARICEPT Take 5 mg by mouth at bedtime.   DULoxetine 20 MG capsule Commonly known as: CYMBALTA Take 1 capsule (20 mg total) by mouth 2 (two) times daily.   Eliquis 5 MG Tabs tablet Generic drug: apixaban Take 5 mg by mouth  2 (two) times daily.   famotidine 20 MG tablet Commonly known as: PEPCID Take 20 mg by mouth daily.   feeding supplement (OSMOLITE 1.5 CAL) Liqd Place 1,000 mLs into feeding tube continuous. What changed:   how much to take  how to take this  when to take this  additional instructions  how fast to infuse this   feeding supplement (PRO-STAT SUGAR FREE 64) Liqd Place 30 mLs into feeding tube daily.   ferrous sulfate 325 (65 FE) MG tablet Take 325 mg by mouth 2 (two) times daily with a meal.   fluocinonide 0.05 % external solution Commonly known as: LIDEX Apply 1 application topically daily as needed (applied to scalp as needed for itching and scaling/until clear).   free water Soln Place 300 mLs into feeding tube every 6 (six) hours. What changed:   how much to take  when to take this   free water Soln Place 200 mLs into feeding tube every 4 (four) hours. What changed: You were already taking a medication with the same name, and this prescription was added. Make sure you understand how and when to take each.   furosemide 40 MG tablet Commonly known as: LASIX Take 1 tablet (40 mg total) by mouth daily. Every 8 hr for swelling prn What changed: when to take this   gabapentin 100 MG capsule Commonly known as: NEURONTIN Take 100 mg by mouth every 8 (eight) hours.   guaifenesin 400 MG Tabs tablet Commonly known as: HUMIBID E Take 400 mg by mouth at bedtime. *May take up to twice daily as needed for cough wuth 8 hours in between doses.   ketoconazole 2 % shampoo Commonly known as: NIZORAL Apply 1 application topically daily.   levETIRAcetam 500 MG tablet Commonly known as: KEPPRA Take 500 mg by mouth 2 (two) times daily.   Linzess 145 MCG Caps capsule Generic drug: linaclotide Take 1 capsule by mouth daily.   loratadine 10 MG tablet Commonly known as: CLARITIN Take 10 mg by mouth daily.   metoprolol tartrate 25 MG tablet Commonly known as:  LOPRESSOR Take 0.5 tablets (12.5 mg total) by mouth 2 (two) times daily.   nitroGLYCERIN 0.4 MG SL tablet Commonly known as:  NITROSTAT Place 0.4 mg under the tongue every 5 (five) minutes as needed for chest pain.   nystatin powder Generic drug: nystatin Apply 1 application topically See admin instructions. Apply twice daily to Arm pit and groin area   ondansetron 4 MG tablet Commonly known as: ZOFRAN Take 4 mg by mouth every 8 (eight) hours as needed for nausea or vomiting.   oxyCODONE 5 MG immediate release tablet Commonly known as: Oxy IR/ROXICODONE Place 1 tablet (5 mg total) into feeding tube every 8 (eight) hours as needed for moderate pain or severe pain. What changed:   when to take this  reasons to take this   potassium chloride 20 MEQ packet Commonly known as: KLOR-CON Take 20 mEq by mouth daily. What changed: when to take this   predniSONE 20 MG tablet Commonly known as: Deltasone Place 2 tablets (40 mg total) into feeding tube daily with breakfast for 5 days.   QUEtiapine 25 MG tablet Commonly known as: SEROQUEL Take 25 mg by mouth at bedtime.   saccharomyces boulardii 250 MG capsule Commonly known as: FLORASTOR Place 1 capsule (250 mg total) into feeding tube 2 (two) times daily.   scopolamine 1 MG/3DAYS Commonly known as: TRANSDERM-SCOP Place 1 patch (1.5 mg total) onto the skin every 3 (three) days.   senna-docusate 8.6-50 MG tablet Commonly known as: Senokot-S Take 1 tablet by mouth 2 (two) times daily.   Systane Balance 0.6 % Soln Generic drug: Propylene Glycol Apply 2 drops to eye at bedtime.   Systane Balance 0.6 % Soln Generic drug: Propylene Glycol Apply 1 drop to eye 2 (two) times daily.   vitamin B-12 100 MCG tablet Commonly known as: CYANOCOBALAMIN Take 100 mcg by mouth daily.   vitamin C 500 MG tablet Commonly known as: ASCORBIC ACID Take 500 mg by mouth 2 (two) times daily.   Vitamin D 50 MCG (2000 UT) tablet Take 2,000 Units  by mouth daily.       Major procedures and Radiology Reports - PLEASE review detailed and final reports for all details, in brief -    DG Chest Portable 1 View  Result Date: 09/14/2019 CLINICAL DATA:  Altered mental status history of COVID positive EXAM: PORTABLE CHEST 1 VIEW COMPARISON:  06/25/2018 FINDINGS: Low lung volumes. Small foci of airspace disease in the right mid lung and left base. Mild cardiomegaly with aortic atherosclerosis. No pneumothorax. IMPRESSION: Low lung volumes. Small foci of airspace disease at the left base and right mid lung, could reflect mild pneumonia. Electronically Signed   By: Donavan Foil M.D.   On: 09/14/2019 16:29   DG Finger Index Right  Result Date: 08/29/2019 X-rays right index finger finger injury 2 weeks ago new films to assist in treatment decision We had a proximal phalanx fracture and also an intra-articular fracture of the PIP joint on the prior film X-rays again confirmed both injuries the proximal phalanx fracture appears to be healing well the intra-articular fracture shows joint depression with comminution Impression PIP joint fracture with comminution and joint depression Proximal phalanx fracture healing  [place] EKG SITE RITE  Result Date: 09/16/2019 If Site Rite image not attached, placement could not be confirmed due to current cardiac rhythm.   Micro Results    Recent Results (from the past 240 hour(s))  Urine culture     Status: Abnormal   Collection Time: 09/14/19  5:35 PM   Specimen: Urine, Random  Result Value Ref Range Status   Specimen Description   Final  URINE, RANDOM Performed at [facility], [address], [place], [place] [address]    Special Requests   Final    NONE Performed at [facility], [address], [place], [place] [address]    Culture >=100,000 COLONIES/mL ESCHERICHIA COLI (A)  Final   Report Status 09/17/2019 FINAL  Final   Organism ID, Bacteria ESCHERICHIA COLI (A)  Final      Susceptibility    Escherichia coli - MIC*    AMPICILLIN <=2 SENSITIVE Sensitive     CEFAZOLIN <=4 SENSITIVE Sensitive     CEFTRIAXONE <=0.25 SENSITIVE Sensitive     CIPROFLOXACIN <=0.25 SENSITIVE Sensitive     GENTAMICIN <=1 SENSITIVE Sensitive     IMIPENEM <=0.25 SENSITIVE Sensitive     NITROFURANTOIN <=16 SENSITIVE Sensitive     TRIMETH/SULFA <=20 SENSITIVE Sensitive     AMPICILLIN/SULBACTAM <=2 SENSITIVE Sensitive     PIP/TAZO <=4 SENSITIVE Sensitive     * >=100,000 COLONIES/mL ESCHERICHIA COLI  Culture, blood (routine x 2)     Status: Abnormal   Collection Time: 09/14/19  8:48 PM   Specimen: BLOOD RIGHT ARM  Result Value Ref Range Status   Specimen Description   Final    BLOOD RIGHT ARM Performed at [facility], [address], [place], [place] [address]    Special Requests   Final    BOTTLES DRAWN AEROBIC AND ANAEROBIC Blood Culture adequate volume Performed at [place] [place], [place] [address]    Culture  Setup Time   Final    GRAM NEGATIVE RODS IN BOTH AEROBIC AND ANAEROBIC BOTTLES Gram Stain Report Called to,Read Back By and Verified With: MARTIN D. AT [id number] ON [id number] BY THOMPSON S. CRITICAL RESULT CALLED TO, READ BACK BY AND VERIFIED WITH: G. Coffee PharmD 15:35 09/15/19 ([email or web address]) Performed at [facility], [place] [address] [place], [place] [address]    Culture ESCHERICHIA COLI (A)  Final   Report Status 09/18/2019 FINAL  Final   Organism ID, Bacteria ESCHERICHIA COLI  Final      Susceptibility   Escherichia coli - MIC*    AMPICILLIN <=2 SENSITIVE Sensitive     CEFAZOLIN <=4 SENSITIVE Sensitive     CEFEPIME <=0.12 SENSITIVE Sensitive     CEFTAZIDIME <=1 SENSITIVE Sensitive     CEFTRIAXONE <=0.25 SENSITIVE Sensitive     CIPROFLOXACIN <=0.25 SENSITIVE Sensitive     GENTAMICIN <=1 SENSITIVE Sensitive     IMIPENEM <=0.25 SENSITIVE Sensitive     TRIMETH/SULFA <=20 SENSITIVE Sensitive     AMPICILLIN/SULBACTAM <=2 SENSITIVE Sensitive     PIP/TAZO <=4 SENSITIVE  Sensitive     * ESCHERICHIA COLI  Blood Culture ID Panel (Reflexed)     Status: Abnormal   Collection Time: 09/14/19  8:48 PM  Result Value Ref Range Status   Enterococcus species NOT DETECTED NOT DETECTED Final   Listeria monocytogenes NOT DETECTED NOT DETECTED Final   Staphylococcus species NOT DETECTED NOT DETECTED Final   Staphylococcus aureus (BCID) NOT DETECTED NOT DETECTED Final   Streptococcus species NOT DETECTED NOT DETECTED Final   Streptococcus agalactiae NOT DETECTED NOT DETECTED Final   Streptococcus pneumoniae NOT DETECTED NOT DETECTED Final   Streptococcus pyogenes NOT DETECTED NOT DETECTED Final   Acinetobacter baumannii NOT DETECTED NOT DETECTED Final   Enterobacteriaceae species DETECTED (A) NOT DETECTED Final    Comment: Enterobacteriaceae represent a large family of gram-negative bacteria, not a single organism. CRITICAL RESULT CALLED TO, READ BACK BY AND VERIFIED WITH: G.  Coffee PharmD 15:35 09/15/19 (wilsonm)    Enterobacter cloacae complex NOT DETECTED NOT DETECTED Final   Escherichia coli DETECTED (A) NOT DETECTED Final    Comment: CRITICAL RESULT CALLED TO, READ BACK BY AND VERIFIED WITH: G. Coffee PharmD 15:35 09/15/19 (wilsonm)    Klebsiella oxytoca NOT DETECTED NOT DETECTED Final   Klebsiella pneumoniae NOT DETECTED NOT DETECTED Final   Proteus species NOT DETECTED NOT DETECTED Final   Serratia marcescens NOT DETECTED NOT DETECTED Final   Carbapenem resistance NOT DETECTED NOT DETECTED Final   Haemophilus influenzae NOT DETECTED NOT DETECTED Final   Neisseria meningitidis NOT DETECTED NOT DETECTED Final   Pseudomonas aeruginosa NOT DETECTED NOT DETECTED Final   Candida albicans NOT DETECTED NOT DETECTED Final   Candida glabrata NOT DETECTED NOT DETECTED Final   Candida krusei NOT DETECTED NOT DETECTED Final   Candida parapsilosis NOT DETECTED NOT DETECTED Final   Candida tropicalis NOT DETECTED NOT DETECTED Final    Comment: Performed at [facility], [place] [address] [place], [place] [address]  Respiratory Panel by RT PCR (Flu A&B, Covid) - Nasopharyngeal Swab     Status: Abnormal   Collection Time: 09/15/19  2:30 AM   Specimen: Nasopharyngeal Swab  Result Value Ref Range Status   SARS Coronavirus 2 by RT PCR POSITIVE (A) NEGATIVE Final    Comment: CRITICAL RESULT CALLED TO, READ BACK BY AND VERIFIED WITH: L ANDREWS,RN @0437  09/15/19 MKELLY (NOTE) SARS-CoV-2 target nucleic acids are DETECTED. SARS-CoV-2 RNA is generally detectable in upper respiratory specimens  during the acute phase of infection. Positive results are indicative of the presence of the identified virus, but do not rule out bacterial infection or co-infection with other pathogens not detected by the test. Clinical correlation with patient history and other diagnostic information is necessary to determine patient infection status. The expected result is Negative. Fact Sheet for Patients:  PinkCheek.be Fact Sheet for Healthcare Providers: GravelBags.it This test is not yet approved or cleared by the [place] FDA and  has been authorized for detection and/or diagnosis of SARS-CoV-2 by FDA under an Emergency Use Authorization (EUA).  This EUA will remain in effect (meaning this test can be  used) for the duration of  the COVID-19 declaration under Section 564(b)(1) of the Act, 21 U.S.C. section 360bbb-3(b)(1), unless the authorization is terminated or revoked sooner.    Influenza A by PCR NEGATIVE NEGATIVE Final   Influenza B by PCR NEGATIVE NEGATIVE Final    Comment: (NOTE) The Xpert Xpress SARS-CoV-2/FLU/RSV assay is intended as an aid in  the diagnosis of influenza from Nasopharyngeal swab specimens and  should not be used as a sole basis for treatment. Nasal washings and  aspirates are unacceptable for Xpert Xpress SARS-CoV-2/FLU/RSV  testing. Fact Sheet for  Patients: PinkCheek.be Fact Sheet for Healthcare Providers: GravelBags.it This test is not yet approved or cleared by the [place] FDA and  has been authorized for detection and/or diagnosis of SARS-CoV-2 by  FDA under an Emergency Use Authorization (EUA). This EUA will remain  in effect (meaning this test can be used) for the duration of the  Covid-19 declaration under Section 564(b)(1) of the Act, 21  U.S.C. section 360bbb-3(b)(1), unless the authorization is  terminated or revoked. Performed at [facility], [address], [place], [place] [address]   Culture, blood (routine x 2)     Status: None   Collection Time: 09/15/19  3:50 AM   Specimen: BLOOD  Result Value Ref Range  Status   Specimen Description BLOOD RIGHT ANTECUBITAL  Final   Special Requests   Final    BOTTLES DRAWN AEROBIC AND ANAEROBIC Blood Culture adequate volume   Culture   Final    NO GROWTH 5 DAYS Performed at [facility], [address], [place], [place] [address]    Report Status 09/20/2019 FINAL  Final  MRSA PCR Screening     Status: None   Collection Time: 09/16/19  7:03 AM   Specimen: Nasopharyngeal  Result Value Ref Range Status   MRSA by PCR NEGATIVE NEGATIVE Final    Comment:        The GeneXpert MRSA Assay (FDA approved for NASAL specimens only), is one component of a comprehensive MRSA colonization surveillance program. It is not intended to diagnose MRSA infection nor to guide or monitor treatment for MRSA infections. Performed at [facility], [address], [place], [place] [address]   C difficile quick scan w PCR reflex     Status: None   Collection Time: 09/17/19 10:08 AM   Specimen: STOOL  Result Value Ref Range Status   C Diff antigen NEGATIVE NEGATIVE Final   C Diff toxin NEGATIVE NEGATIVE Final   C Diff interpretation No C. difficile detected.  Final    Comment: Performed at [facility], [address]  [place], [place] [address]       Today   Subjective    Mark [name] today has no new complaints, No fever    -Tolerating PEG tube feeding well without emesis   Patient has been seen and examined prior to discharge  -Patient was discharged to [facility] on [id number] however [facility] was unable to pick patient up until 09/21/2019   Objective   Blood pressure 124/71, pulse 83, temperature 97.8 F (36.6 C), temperature source Axillary, resp. rate 19, height 6\' 2"  (1.88 m), weight 113.3 kg, SpO2 100 %.   Intake/Output Summary (Last 24 hours) at 09/21/2019 1209 Last data filed at 09/21/2019 1153 Gross per 24 hour  Intake 9714.02 ml  Output 2025 ml  Net 7689.02 ml    Exam Gen:- Awake ,, nonverbal, no acute distress  HEENT:- [place].AT, No sclera icterus Neck-Supple Neck,No JVD,.  Lungs-no wheezing, air movement is fair CV- S1, S2 normal, irregular Abd-  +ve B.Sounds, Abd Soft, No tenderness, increased truncal adiposity, PEG tube in situ  extremity-Right hand/wrist with a splint, good pulses Psych-affect is appropriate, oriented x3 Neuro-generalized weakness, no purposeful movement, not verbal, unable to assess mood and cognitive function as patient is really not interactive at this time -Skin--stage III sacral decub--POA, does not look infected  Data Review   CBC w Diff:  Lab Results  Component Value Date   WBC 10.1 09/20/2019   HGB 10.2 (L) 09/20/2019   HGB 14.5 08/15/2013   HCT 33.1 (L) 09/20/2019   HCT 44 08/15/2013   PLT 63 (L) 09/20/2019   LYMPHOPCT 8 09/19/2019   MONOPCT 6 09/19/2019   EOSPCT 0 09/19/2019   BASOPCT 0 09/19/2019    CMP:  Lab Results  Component Value Date   NA 145 09/19/2019   K 3.7 09/19/2019   CL 116 (H) 09/19/2019   CO2 22 09/19/2019   BUN 61 (H) 09/19/2019   BUN 19 08/15/2013   CREATININE 1.33 (H) 09/19/2019   CREATININE 1.28 08/15/2013   PROT 5.3 (L) 09/19/2019   PROT 6.2 10/03/2013   ALBUMIN 2.1 (L) 09/19/2019   ALBUMIN 3.4  10/03/2013   BILITOT 0.5 09/19/2019  BILITOT 0.3 10/03/2013   ALKPHOS 121 09/19/2019   ALKPHOS 75 10/03/2013   AST 112 (H) 09/19/2019   AST 13 10/03/2013   ALT 53 (H) 09/19/2019  . -Patient was discharged to [facility] on [id number] however [facility] was unable to pick patient up until 09/21/2019  Total Discharge time is about 33 minutes  Roxan Hockey M.D on 09/21/2019 at 12:09 PM  Go to [email or web address] -  for contact info  [facility] - Office  [phone]

## 2019-09-20 NOTE — Care Management Important Message (Signed)
Important Message  Patient Details  Name: Mark [name] MRN: [id number] Date of Birth: 1945-11-27   Medicare Important Message Given:  Yes(given by RN due to airborne and contact precautions)     Tommy Medal 09/20/2019, 3:14 PM

## 2019-09-20 NOTE — Discharge Instructions (Signed)
1)Avoid ibuprofen/Advil/Aleve/Motrin/Goody Powders/Naproxen/BC powders/Meloxicam/Diclofenac/Indomethacin and other Nonsteroidal anti-inflammatory medications as these will make you more likely to bleed and can cause stomach ulcers, can also cause Kidney problems.   2)--Palliative care consult at  facility, patient may need to transition to hospice care if condition does not improve

## 2019-09-20 NOTE — Progress Notes (Signed)
Patient Demographics:    Mark [name], is a 74 y.o. male, DOB - Jun 25, 1946, [id number]  Admit date - 09/14/2019   Admitting Physician Barton Dubois, MD  Outpatient Primary MD for the patient is Caprice Renshaw, MD  LOS - 5   Chief Complaint  Patient presents with  . Failure To Thrive        Subjective:    Mark [name] today has no fevers, no emesis,  No chest pain,   -Diarrhea has improved, -Tolerating PEG tube feeding well,  Assessment  & Plan :    Principal Problem:   Acute respiratory disease due to COVID-19 virus Active Problems:   Acute respiratory failure with hypoxemia (HCC)   Altered mental status   Severe sepsis with septic shock (CODE) (HCC)   Brief Narrative: 74 y.o.male,with history of hypertension, CAD, paroxysmal atrial fibrillation, advanced Parkinson disease, dementia, paroxysmal atrial fibrillation on apixaban, hypertension, CKD stage III, history of seizures who was diagnosed with COVID-19 infection on 09/10/2019. Presented from nursing home with 2-day history of reduced responsiveness and poor p.o. intake. Patient gets most of his nutrition and fluid through the feeding tube. There is no history of nausea vomiting or diarrhea. He does have history of frequent UTIs. Patient has been less verbal as per patient's wife. Patient usually not able to carry conversation at baseline. In the ED patient was found to have UTI, and was going to be discharged home. Patient became hypotensive in the ED, he was given Rocephin and IV fluid boluses.Currently he is requiring 4 L/min of oxygen.He was unresponsive and unable to provide any history.    Assessment & Plan: 1-acute resp failure with hypoxia due to COVID PNA -requiring non-rebreather initially -Hypoxia is resolved, successfully weaned off oxygen -Completed IV remdesivir   Completed  IV steroids, discharged on p.o.  prednisone   2-severe sepsis with septic shock -due to E, coli UTI/bactermia -Per culture results microorganism is pansensitive. Treated with IV antibiotics including IV Rocephin, okay to discharge on Keflex via PEG tube to complete at least 10 days of antibiotic therapy  3 social/ethics --- discussed with Vinie Sill from palliative care services, official palliative care consult appreciated, also discussed with patient's wife, okay to discharge to [facility] with palliative care following  4-chronic paroxysmal a. 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   6-Altered mental status/metabolic encephalopathy -patient with underlying hx of dementia and parkinson's -Patient is nonverbal, not really interactive, -Appears comfortable  7-parkinson's dementia -continue sinemet -patient meds and nutrition given through PEG  8-hx of seizure  continue keppra -no seizure appreciated.   9-DNR -patient DNR/DNI prior to admission -long discussion about prognosis and limited response so far with family -will continue to treat what is treatable -Palliative care consulted for advance directives. -poorlong-termprognosis   10-stage 3 sacrum pressure injury -continue preventive measures -Pressure injury was present prior to admission.  -Patient is bed-bound at baseline  11-diarrhea -flexiseal in place to protect skin integrity; patient with stage 3 sacrum pressure injury already. -C. difficile  negative; continue the use of Florastor. -patient on chronic tube feeding and on antibiotics. -Much improved   12)FEN--- Tube feeding Osmolite 1.5 at 60 ml/hr continuously, water flushes 300 mL every 6 hours and prostat 30 ml daily via PEG  tube  13) low platelet count--- thrombocytopenia noted, patient on apixaban for stroke prophylaxis,, COVID-19 infection is associated with hypercoagulable state especially in the patient who is challenged from a mobility standpoint -Risk versus benefit of continue anticoagulation discussed with wife -At this time wife would like to continue anticoagulation none risk of bleeding exists -If any bleeding concerns apixaban will have to be discontinued -   DVT prophylaxis:on apixaban Code Status:DNR/DNI Family Communication:wife over the phone Disposition --- discharge to [facility] when bed available   completed IV sterodis, completed IV remdesivir and completed IV antibiotics. Successfully weaned off Levophed , Lactic acidosis has resolved. Continue repleting electrolytes and providing supportive care.  Long-term prognosis remains poor  Consultants:  Palliative care  intensivist curbside (Dr. Elsworth Soho) on 09/15/19; continue current management.   Procedures:  See below for x-ray reports.   Discharge Condition: Poor prognosis overall  Follow UP--outpatient palliative care follow-up advised possible transition to hospice if continues to decline  Code Status : DNR  Family Communication:   Discussed with patient's wife by phone Lab Results  Component Value Date   PLT 63 (L) 09/20/2019    Inpatient Medications  Scheduled Meds: . apixaban  5 mg Per Tube BID  . atorvastatin  40 mg Per Tube Daily  . carbidopa-levodopa  1 tablet Per Tube TID  . chlorhexidine  15 mL Mouth Rinse BID  . Chlorhexidine Gluconate Cloth  6 each Topical Daily  . dexamethasone (DECADRON) injection  6 mg Intravenous Q24H  . donepezil  5 mg Per Tube QHS  . DULoxetine  20 mg Oral BID  . feeding supplement (PRO-STAT SUGAR FREE 64)  30 mL Per Tube Daily  . free water  300 mL Per Tube Q6H  . gabapentin  100 mg Per Tube TID  . insulin aspart  0-9 Units Subcutaneous Q4H  . insulin detemir  7 Units  Subcutaneous Daily  . levETIRAcetam  500 mg Per Tube BID  . mouth rinse  15 mL Mouth Rinse q12n4p  . metoprolol tartrate  12.5 mg Oral BID  . QUEtiapine  50 mg Per Tube QHS  . saccharomyces boulardii  250 mg Per Tube BID  . scopolamine  1 patch Transdermal Q72H   Continuous Infusions: . cefTRIAXone (ROCEPHIN)  IV Stopped (09/20/19 0846)  . dextrose 5 % with KCl 20 mEq / L 75 mL/hr at 09/20/19 1200  . feeding supplement (OSMOLITE 1.5 CAL) 1,000 mL (09/20/19 0106)   PRN Meds:.acetaminophen, ondansetron **OR** ondansetron (ZOFRAN) IV    Anti-infectives (From admission, onward)   Start     Dose/Rate Route Frequency Ordered Stop   09/20/19 0000  cephALEXin (KEFLEX) 500 MG capsule     500 mg Oral 3 times daily 09/20/19 1521 09/25/19 2359   09/18/19 0900  cefTRIAXone (ROCEPHIN) 2 g in sodium chloride 0.9 % 100 mL IVPB     2 g 200 mL/hr over 30 Minutes Intravenous Every 24 hours 09/18/19 0855     09/18/19 0845  meropenem (MERREM) 1 g in sodium chloride 0.9 % 100 mL IVPB  Status:  Discontinued     1 g 200 mL/hr over 30 Minutes Intravenous Every 8 hours 09/18/19 0830 09/18/19 0846   09/16/19 1000  remdesivir 100 mg in sodium  chloride 0.9 % 100 mL IVPB  Status:  Discontinued     100 mg 200 mL/hr over 30 Minutes Intravenous Daily 09/15/19 0054 09/15/19 0059   09/16/19 1000  remdesivir 100 mg in sodium chloride 0.9 % 100 mL IVPB     100 mg 200 mL/hr over 30 Minutes Intravenous Daily 09/15/19 0022 09/19/19 1049   09/15/19 2200  cefTRIAXone (ROCEPHIN) 1 g in sodium chloride 0.9 % 100 mL IVPB  Status:  Discontinued     1 g 200 mL/hr over 30 Minutes Intravenous Every 24 hours 09/15/19 0054 09/15/19 1010   09/15/19 1100  meropenem (MERREM) 1 g in sodium chloride 0.9 % 100 mL IVPB  Status:  Discontinued     1 g 200 mL/hr over 30 Minutes Intravenous Every 12 hours 09/15/19 1049 09/18/19 0830   09/15/19 0100  remdesivir 200 mg in sodium chloride 0.9% 250 mL IVPB     200 mg 580 mL/hr over 30  Minutes Intravenous Once 09/15/19 0022 09/15/19 0245   09/15/19 0054  remdesivir 200 mg in sodium chloride 0.9% 250 mL IVPB  Status:  Discontinued     200 mg 580 mL/hr over 30 Minutes Intravenous Once 09/15/19 0054 09/15/19 0059   09/14/19 1900  cefTRIAXone (ROCEPHIN) 1 g in sodium chloride 0.9 % 100 mL IVPB     1 g 200 mL/hr over 30 Minutes Intravenous  Once 09/14/19 1858 09/14/19 2142        Objective:   Vitals:   09/20/19 1300 09/20/19 1647 09/20/19 1700 09/20/19 1800  BP: 120/65  (!) 148/87 128/75  Pulse: 88  99 92  Resp: 17  (!) 21 19  Temp:  98 F (36.7 C)    TempSrc:  Axillary    SpO2: 98%  98% 98%  Weight:      Height:        Wt Readings from Last 3 Encounters:  09/19/19 113.3 kg  11/13/18 52.6 kg  06/29/18 116.6 kg     Intake/Output Summary (Last 24 hours) at 09/20/2019 1933 Last data filed at 09/20/2019 1700 Gross per 24 hour  Intake 1581.94 ml  Output 1200 ml  Net 381.94 ml     Physical Exam  Gen:- Awake ,, nonverbal, no acute distress  HEENT:- [place].AT, No sclera icterus Neck-Supple Neck,No JVD,.  Lungs-no wheezing, air movement is fair CV- S1, S2 normal, irregular Abd-  +ve B.Sounds, Abd Soft, No tenderness, increased truncal adiposity, PEG tube in situ  extremity-Right hand/wrist with a splint, good pulses Psych-affect is appropriate, oriented x3 Neuro-generalized weakness, no purposeful movement, not verbal, unable to assess mood and cognitive function as patient is really not interactive at this time -Skin--stage III sacral decub--POA, does not look infected   Data Review:   Micro Results Recent Results (from the past 240 hour(s))  Urine culture     Status: Abnormal   Collection Time: 09/14/19  5:35 PM   Specimen: Urine, Random  Result Value Ref Range Status   Specimen Description   Final    URINE, RANDOM Performed at [facility], [address], [place], [place] [address]    Special Requests   Final    NONE Performed at [facility], [address], [place], [place] [address]    Culture >=100,000 COLONIES/mL ESCHERICHIA COLI (A)  Final   Report Status 09/17/2019 FINAL  Final   Organism ID, Bacteria ESCHERICHIA COLI (A)  Final      Susceptibility   Escherichia coli - MIC*  AMPICILLIN <=2 SENSITIVE Sensitive     CEFAZOLIN <=4 SENSITIVE Sensitive     CEFTRIAXONE <=0.25 SENSITIVE Sensitive     CIPROFLOXACIN <=0.25 SENSITIVE Sensitive     GENTAMICIN <=1 SENSITIVE Sensitive     IMIPENEM <=0.25 SENSITIVE Sensitive     NITROFURANTOIN <=16 SENSITIVE Sensitive     TRIMETH/SULFA <=20 SENSITIVE Sensitive     AMPICILLIN/SULBACTAM <=2 SENSITIVE Sensitive     PIP/TAZO <=4 SENSITIVE Sensitive     * >=100,000 COLONIES/mL ESCHERICHIA COLI  Culture, blood (routine x 2)     Status: Abnormal   Collection Time: 09/14/19  8:48 PM   Specimen: BLOOD RIGHT ARM  Result Value Ref Range Status   Specimen Description   Final    BLOOD RIGHT ARM Performed at [facility], [address] [place], [place] [address]    Special Requests   Final    BOTTLES DRAWN AEROBIC AND ANAEROBIC Blood Culture adequate volume Performed at [facility], [address], [place], [place] [address]    Culture  Setup Time   Final    GRAM NEGATIVE RODS IN BOTH AEROBIC AND ANAEROBIC BOTTLES Gram Stain Report Called to,Read Back By and Verified With: MARTIN D. AT [id number] ON [id number] BY THOMPSON S. CRITICAL RESULT CALLED TO, READ BACK BY AND VERIFIED WITH: G. Coffee PharmD 15:35 09/15/19 (wilsonm) Performed at [facility], [place] [address], [place], [place] [address]    Culture ESCHERICHIA COLI (A)  Final   Report Status 09/18/2019 FINAL  Final   Organism ID, Bacteria ESCHERICHIA COLI  Final      Susceptibility   Escherichia coli - MIC*    AMPICILLIN <=2 SENSITIVE Sensitive     CEFAZOLIN <=4 SENSITIVE Sensitive     CEFEPIME <=0.12 SENSITIVE Sensitive     CEFTAZIDIME <=1 SENSITIVE Sensitive     CEFTRIAXONE <=0.25 SENSITIVE Sensitive     CIPROFLOXACIN  <=0.25 SENSITIVE Sensitive     GENTAMICIN <=1 SENSITIVE Sensitive     IMIPENEM <=0.25 SENSITIVE Sensitive     TRIMETH/SULFA <=20 SENSITIVE Sensitive     AMPICILLIN/SULBACTAM <=2 SENSITIVE Sensitive     PIP/TAZO <=4 SENSITIVE Sensitive     * ESCHERICHIA COLI  Blood Culture ID Panel (Reflexed)     Status: Abnormal   Collection Time: 09/14/19  8:48 PM  Result Value Ref Range Status   Enterococcus species NOT DETECTED NOT DETECTED Final   Listeria monocytogenes NOT DETECTED NOT DETECTED Final   Staphylococcus species NOT DETECTED NOT DETECTED Final   Staphylococcus aureus (BCID) NOT DETECTED NOT DETECTED Final   Streptococcus species NOT DETECTED NOT DETECTED Final   Streptococcus agalactiae NOT DETECTED NOT DETECTED Final   Streptococcus pneumoniae NOT DETECTED NOT DETECTED Final   Streptococcus pyogenes NOT DETECTED NOT DETECTED Final   Acinetobacter baumannii NOT DETECTED NOT DETECTED Final   Enterobacteriaceae species DETECTED (A) NOT DETECTED Final    Comment: Enterobacteriaceae represent a large family of gram-negative bacteria, not a single organism. CRITICAL RESULT CALLED TO, READ BACK BY AND VERIFIED WITH: G. Coffee PharmD 15:35 09/15/19 (wilsonm)    Enterobacter cloacae complex NOT DETECTED NOT DETECTED Final   Escherichia coli DETECTED (A) NOT DETECTED Final    Comment: CRITICAL RESULT CALLED TO, READ BACK BY AND VERIFIED WITH: G. Coffee PharmD 15:35 09/15/19 (wilsonm)    Klebsiella oxytoca NOT DETECTED NOT DETECTED Final   Klebsiella pneumoniae NOT DETECTED NOT DETECTED Final   Proteus species NOT DETECTED NOT DETECTED Final   Serratia marcescens NOT DETECTED NOT DETECTED  Final   Carbapenem resistance NOT DETECTED NOT DETECTED Final   Haemophilus influenzae NOT DETECTED NOT DETECTED Final   Neisseria meningitidis NOT DETECTED NOT DETECTED Final   Pseudomonas aeruginosa NOT DETECTED NOT DETECTED Final   Candida albicans NOT DETECTED NOT DETECTED Final   Candida glabrata NOT  DETECTED NOT DETECTED Final   Candida krusei NOT DETECTED NOT DETECTED Final   Candida parapsilosis NOT DETECTED NOT DETECTED Final   Candida tropicalis NOT DETECTED NOT DETECTED Final    Comment: Performed at [facility], [place] [address] [place], [place] [address]  Respiratory Panel by RT PCR (Flu A&B, Covid) - Nasopharyngeal Swab     Status: Abnormal   Collection Time: 09/15/19  2:30 AM   Specimen: Nasopharyngeal Swab  Result Value Ref Range Status   SARS Coronavirus 2 by RT PCR POSITIVE (A) NEGATIVE Final    Comment: CRITICAL RESULT CALLED TO, READ BACK BY AND VERIFIED WITH: L ANDREWS,RN @0437  09/15/19 MKELLY (NOTE) SARS-CoV-2 target nucleic acids are DETECTED. SARS-CoV-2 RNA is generally detectable in upper respiratory specimens  during the acute phase of infection. Positive results are indicative of the presence of the identified virus, but do not rule out bacterial infection or co-infection with other pathogens not detected by the test. Clinical correlation with patient history and other diagnostic information is necessary to determine patient infection status. The expected result is Negative. Fact Sheet for Patients:  PinkCheek.be Fact Sheet for Healthcare Providers: GravelBags.it This test is not yet approved or cleared by the [place] FDA and  has been authorized for detection and/or diagnosis of SARS-CoV-2 by FDA under an Emergency Use Authorization (EUA).  This EUA will remain in effect (meaning this test can be  used) for the duration of  the COVID-19 declaration under Section 564(b)(1) of the Act, 21 U.S.C. section 360bbb-3(b)(1), unless the authorization is terminated or revoked sooner.    Influenza A by PCR NEGATIVE NEGATIVE Final   Influenza B by PCR NEGATIVE NEGATIVE Final    Comment: (NOTE) The Xpert Xpress SARS-CoV-2/FLU/RSV assay is intended as an aid in  the diagnosis of influenza from  Nasopharyngeal swab specimens and  should not be used as a sole basis for treatment. Nasal washings and  aspirates are unacceptable for Xpert Xpress SARS-CoV-2/FLU/RSV  testing. Fact Sheet for Patients: PinkCheek.be Fact Sheet for Healthcare Providers: GravelBags.it This test is not yet approved or cleared by the [place] FDA and  has been authorized for detection and/or diagnosis of SARS-CoV-2 by  FDA under an Emergency Use Authorization (EUA). This EUA will remain  in effect (meaning this test can be used) for the duration of the  Covid-19 declaration under Section 564(b)(1) of the Act, 21  U.S.C. section 360bbb-3(b)(1), unless the authorization is  terminated or revoked. Performed at [facility], [address], [place], [place] [address]   Culture, blood (routine x 2)     Status: None   Collection Time: 09/15/19  3:50 AM   Specimen: BLOOD  Result Value Ref Range Status   Specimen Description BLOOD RIGHT ANTECUBITAL  Final   Special Requests   Final    BOTTLES DRAWN AEROBIC AND ANAEROBIC Blood Culture adequate volume   Culture   Final    NO GROWTH 5 DAYS Performed at [facility], [address] [place], [place] [address]    Report Status 09/20/2019 FINAL  Final  MRSA PCR Screening     Status: None   Collection Time: 09/16/19  7:03 AM  Specimen: Nasopharyngeal  Result Value Ref Range Status   MRSA by PCR NEGATIVE NEGATIVE Final    Comment:        The GeneXpert MRSA Assay (FDA approved for NASAL specimens only), is one component of a comprehensive MRSA colonization surveillance program. It is not intended to diagnose MRSA infection nor to guide or monitor treatment for MRSA infections. Performed at [facility], [address], [place], [place] [address]   C difficile quick scan w PCR reflex     Status: None   Collection Time: 09/17/19 10:08 AM   Specimen: STOOL  Result Value Ref Range Status   C Diff  antigen NEGATIVE NEGATIVE Final   C Diff toxin NEGATIVE NEGATIVE Final   C Diff interpretation No C. difficile detected.  Final    Comment: Performed at [facility], [address], [place], [place] [address]    Radiology Reports DG Chest Portable 1 View  Result Date: 09/14/2019 CLINICAL DATA:  Altered mental status history of COVID positive EXAM: PORTABLE CHEST 1 VIEW COMPARISON:  06/25/2018 FINDINGS: Low lung volumes. Small foci of airspace disease in the right mid lung and left base. Mild cardiomegaly with aortic atherosclerosis. No pneumothorax. IMPRESSION: Low lung volumes. Small foci of airspace disease at the left base and right mid lung, could reflect mild pneumonia. Electronically Signed   By: Donavan Foil M.D.   On: 09/14/2019 16:29   DG Finger Index Right  Result Date: 08/29/2019 X-rays right index finger finger injury 2 weeks ago new films to assist in treatment decision We had a proximal phalanx fracture and also an intra-articular fracture of the PIP joint on the prior film X-rays again confirmed both injuries the proximal phalanx fracture appears to be healing well the intra-articular fracture shows joint depression with comminution Impression PIP joint fracture with comminution and joint depression Proximal phalanx fracture healing  DG Finger Index Right  Result Date: 08/22/2019 CLINICAL DATA:  Right index finger pain. EXAM: RIGHT INDEX FINGER 2+V COMPARISON:  December 19, 2011. FINDINGS: Minimally displaced fracture is seen involving the proximal base of the first proximal phalanx. Degenerative changes seen involving the proximal interphalangeal joint of the index finger. No soft tissue abnormality is noted. IMPRESSION: Minimally displaced fracture is seen involving proximal base of first proximal phalanx. Electronically Signed   By: Marijo Conception M.D.   On: 08/22/2019 09:59   [place] EKG SITE RITE  Result Date: 09/16/2019 If Site Rite image not attached, placement could not be  confirmed due to current cardiac rhythm.    CBC Recent Labs  Lab 09/15/19 0350 09/16/19 0842 09/17/19 0429 09/18/19 0420 09/19/19 0355 09/20/19 0411  WBC 4.7 36.0* 31.3* 19.0* 12.5* 10.1  HGB 11.9* 13.8 12.4* 11.2* 10.2* 10.2*  HCT 39.0 42.7 38.8* 34.5* 31.6* 33.1*  PLT 170 108* 88* 64* 52* 63*  MCV 97.5 94.9 94.6 94.8 95.8 96.2  MCH 29.8 30.7 30.2 30.8 30.9 29.7  MCHC 30.5 32.3 32.0 32.5 32.3 30.8  RDW 14.9 15.3 15.4 15.6* 15.9* 15.9*  LYMPHSABS 0.3* 1.1 0.9 1.0 1.0  --   MONOABS 0.0* 1.3* 0.8 1.0 0.7  --   EOSABS 0.0 0.2 0.4 0.0 0.0  --   BASOSABS 0.0 0.0 0.1 0.0 0.0  --     Chemistries  Recent Labs  Lab 09/15/19 0350 09/16/19 0842 09/17/19 0429 09/18/19 0420 09/19/19 0355  NA 145 146* 146* 148* 145  K 2.6* 3.5 3.0* 2.8* 3.7  CL 112* 115* 113* 116* 116*  CO2 19* 19* 19* 22 22  GLUCOSE 115* 128* 242* 202* 278*  BUN 27* 53* 57* 62* 61*  CREATININE 2.29* 2.34* 1.95* 1.55* 1.33*  CALCIUM 7.4* 7.3* 7.6* 8.0* 8.1*  MG 1.5*  --   --   --   --   AST 48* 75* 66* 62* 112*  ALT 21 24 22 19  53*  ALKPHOS 97 100 95 102 121  BILITOT 1.7* 1.0 0.7 0.5 0.5   ------------------------------------------------------------------------------------------------------------------ No results for input(s): CHOL, HDL, LDLCALC, TRIG, CHOLHDL, LDLDIRECT in the last 72 hours.  Lab Results  Component Value Date   HGBA1C 5.5 09/18/2019   ------------------------------------------------------------------------------------------------------------------ No results for input(s): TSH, T4TOTAL, T3FREE, THYROIDAB in the last 72 hours.  Invalid input(s): FREET3 ------------------------------------------------------------------------------------------------------------------ Recent Labs    09/18/19 0420 09/19/19 0355  FERRITIN 1,397* 686*    Coagulation profile No results for input(s): INR, PROTIME in the last 168 hours.  Recent Labs    09/18/19 0420 09/19/19 0355  DDIMER 1.41* 0.73*     Cardiac Enzymes No results for input(s): CKMB, TROPONINI, MYOGLOBIN in the last 168 hours.  Invalid input(s): CK ------------------------------------------------------------------------------------------------------------------    Component Value Date/Time   BNP 93.0 11/13/2018 [place] M.D on 09/20/2019 at 7:33 PM  Go to [email or web address] - for contact info  [facility] - Office  [phone]

## 2019-09-21 LAB — GLUCOSE, CAPILLARY
Glucose-Capillary: 154 mg/dL — ABNORMAL HIGH (ref 70–99)
Glucose-Capillary: 169 mg/dL — ABNORMAL HIGH (ref 70–99)
Glucose-Capillary: 199 mg/dL — ABNORMAL HIGH (ref 70–99)

## 2019-09-21 NOTE — Progress Notes (Signed)
Pt's d/c instructions, follow up, and medication changes reviewed with Dedra Skeens, LPN at [facility]. Staff states bed is ready for pt and EMS has been called.

## 2019-09-21 NOTE — NC FL2 (Signed)
[place] LEVEL OF CARE SCREENING TOOL     IDENTIFICATION  Patient Name: Mark [name] Birthdate: 07/05/1946 Sex: male Admission Date (Current Location): 09/14/2019  [place] and [place] Number:  [place] MD:[id number] [place] and Address:  [place] [address] [place]      Provider Number: [id number]  Attending Physician Name and Address:  Roxan Hockey, MD  Relative Name and Phone Number:       Current Level of Care: Hospital Recommended Level of Care: [place] Prior Approval Number:    Date Approved/Denied:   PASRR Number:    Discharge Plan: SNF    Current Diagnoses: Patient Active Problem List   Diagnosis Date Noted  . Acute respiratory disease due to COVID-19 virus 09/20/2019  . Altered mental status 09/15/2019  . Acute respiratory failure with hypoxemia ([place]) 09/15/2019  . Severe sepsis with septic shock (CODE) ([place]) 09/15/2019  . Dementia without behavioral disturbance ([place]) 05/16/2019  . Acute renal failure superimposed on stage 3 chronic kidney disease ([place]) 06/29/2018  . Acute lower UTI 06/29/2018  . Parkinson's disease ([place])   . Advanced care planning/counseling discussion   . Goals of care, counseling/discussion   . Palliative care by specialist   . Other dysphagia   . Hypernatremia 06/25/2018  . Gait abnormality 05/26/2018  . Left arm swelling 06/15/2016  . Psychosis, paranoid ([place]) 01/24/2016  . Psychosis ([place]) 01/24/2016  . Hallucinations   . Chest pain 08/22/2014  . Coronary artery disease involving native coronary artery of native heart with other form of angina pectoris ([place])   . Essential hypertension   . Hyperlipidemia   . Paroxysmal atrial fibrillation ([facility])   . Colon cancer screening 01/03/2014  . Elevated LFTs 10/05/2013  . Liver lesion 06/23/2012  . Pyelonephritis, acute 06/23/2012  . Hypotension 06/21/2012  . Bacteremia due to Escherichia coli 06/21/2012  . Sepsis  ([place]) 06/20/2012  . FTT (failure to thrive) in adult 06/19/2012  . ARF (acute renal failure) ([place]) 06/19/2012  . Urinary tract infection with hematuria 06/19/2012  . Dehydration 06/19/2012  . Frequent falls 06/19/2012  . Hypokalemia 06/19/2012  . Anemia 06/19/2012  . Seizure disorder ([place]) 06/19/2012  . Fracture of right olecranon process 10/23/2011  . Olecranon fracture, recurrent 10/19/2011  . Fall 08/13/2011  . CAD (coronary artery disease) 07/14/2011  . HTN (hypertension) 07/14/2011  . Parkinson disease ([place]) 07/14/2011    Orientation RESPIRATION BLADDER Height & Weight        Normal Incontinent Weight: 249 lb 12.5 oz (113.3 kg) Height:  6\' 2"  (188 cm)  BEHAVIORAL SYMPTOMS/MOOD NEUROLOGICAL BOWEL NUTRITION STATUS      Incontinent Feeding tube  AMBULATORY STATUS COMMUNICATION OF NEEDS Skin   Total Care   PU Stage and Appropriate Care(Pressure Injury: buttocks, mid, left. Stage 3 right buttocks)                       Personal Care Assistance Level of Assistance  Total care       Total Care Assistance: Maximum assistance   Functional Limitations Info             SPECIAL CARE FACTORS FREQUENCY                       Contractures Contractures Info: Not present    Additional Factors Info  Code Status, Allergies, Psychotropic Code Status Info: DNR Allergies Info: Keppra, Ciprofloxacin Psychotropic Info: Cymbalta, Seroquel  Current Medications (09/21/2019):  This is the current hospital active medication list Current Facility-Administered Medications  Medication Dose Route Frequency Provider Last Rate Last Admin  . acetaminophen (TYLENOL) suppository 650 mg  650 mg Rectal Q4H PRN Barton Dubois, MD   650 mg at 09/15/19 1730  . apixaban (ELIQUIS) tablet 5 mg  5 mg Per Tube BID Barton Dubois, MD   5 mg at 09/21/19 0818  . atorvastatin (LIPITOR) tablet 40 mg  40 mg Per Tube Daily Barton Dubois, MD   40 mg at 09/21/19 0819  . carbidopa-levodopa  (SINEMET IR) 25-100 MG per tablet immediate release 1 tablet  1 tablet Per Tube TID Barton Dubois, MD   1 tablet at 09/21/19 0818  . cefTRIAXone (ROCEPHIN) 2 g in sodium chloride 0.9 % 100 mL IVPB  2 g Intravenous Q24H Barton Dubois, MD 200 mL/hr at 09/21/19 0821 2 g at 09/21/19 0821  . chlorhexidine (PERIDEX) 0.12 % solution 15 mL  15 mL Mouth Rinse BID Barton Dubois, MD   15 mL at 09/21/19 0819  . Chlorhexidine Gluconate Cloth 2 % PADS 6 each  6 each Topical Daily Barton Dubois, MD   6 each at 09/21/19 [phone]  . dexamethasone (DECADRON) injection 6 mg  6 mg Intravenous Q24H Barton Dubois, MD   6 mg at 09/21/19 0032  . dextrose 5 % with KCl 20 mEq / L  infusion  20 mEq Intravenous Continuous Barton Dubois, MD 75 mL/hr at 09/20/19 1200 Rate Verify at 09/20/19 1200  . donepezil (ARICEPT) tablet 5 mg  5 mg Per Tube QHS Barton Dubois, MD   5 mg at 09/20/19 2133  . DULoxetine (CYMBALTA) DR capsule 20 mg  20 mg Oral BID Barton Dubois, MD   Stopped at 09/19/19 2136  . feeding supplement (OSMOLITE 1.5 CAL) liquid 1,000 mL  1,000 mL Per Tube Continuous Barton Dubois, MD 60 mL/hr at 09/20/19 2018 1,000 mL at 09/20/19 2018  . feeding supplement (PRO-STAT SUGAR FREE 64) liquid 30 mL  30 mL Per Tube Daily Barton Dubois, MD   30 mL at 09/21/19 0820  . free water 300 mL  300 mL Per Tube Q6H Barton Dubois, MD   300 mL at 09/21/19 0820  . gabapentin (NEURONTIN) 250 MG/5ML solution 100 mg  100 mg Per Tube TID Barton Dubois, MD   100 mg at 09/21/19 0817  . insulin aspart (novoLOG) injection 0-9 Units  0-9 Units Subcutaneous Q4H Barton Dubois, MD   2 Units at 09/21/19 [phone]  . insulin detemir (LEVEMIR) injection 7 Units  7 Units Subcutaneous Daily Barton Dubois, MD   7 Units at 09/20/19 [phone]  . levETIRAcetam (KEPPRA) 100 MG/ML solution 500 mg  500 mg Per Tube BID Barton Dubois, MD   500 mg at 09/21/19 0818  . MEDLINE mouth rinse  15 mL Mouth Rinse q12n4p Barton Dubois, MD   15 mL at 09/20/19 1713  .  metoprolol tartrate (LOPRESSOR) tablet 12.5 mg  12.5 mg Oral BID Barton Dubois, MD   12.5 mg at 09/21/19 0817  . ondansetron (ZOFRAN) tablet 4 mg  4 mg Oral Q6H PRN Barton Dubois, MD       Or  . ondansetron [facility]) injection 4 mg  4 mg Intravenous Q6H PRN Barton Dubois, MD      . QUEtiapine (SEROQUEL) tablet 50 mg  50 mg Per Tube QHS Barton Dubois, MD   50 mg at 09/20/19 2134  . saccharomyces boulardii (FLORASTOR) capsule 250 mg  250 mg  Per Tube BID Barton Dubois, MD   250 mg at 09/21/19 0818  . scopolamine (TRANSDERM-SCOP) 1 MG/3DAYS 1.5 mg  1 patch Transdermal Q72H Barton Dubois, MD   1.5 mg at 09/18/19 1537     Discharge Medications: Please see discharge summary for a list of discharge medications.  Relevant Imaging Results:  Relevant Lab Results:   Additional Information SSN # [id number]  Ihor Gully, LCSW

## 2019-09-21 NOTE — Progress Notes (Signed)
Patient Demographics:    Mark [name], is a 74 y.o. male, DOB - 1945/11/29, [id number]  Admit date - 09/14/2019   Admitting Physician Barton Dubois, MD  Outpatient Primary MD for the patient is Caprice Renshaw, MD  LOS - 6   Chief Complaint  Patient presents with  . Failure To Thrive        Subjective:    Mark [name] today has no fevers, no emesis,  No chest pain,   -0 resting comfortably Tolerating PEG tube feeding well No new concerns -Remains nonverbal --Patient was discharged to [facility] on [id number] however [facility] was unable to pick patient up until 09/21/2019   Assessment  & Plan :    Principal Problem:   Acute respiratory disease due to COVID-19 virus Active Problems:   Acute respiratory failure with hypoxemia (HCC)   Altered mental status   Severe sepsis with septic shock (CODE) (HCC)   Brief Narrative:  74 y.o.male,with history of hypertension, CAD, paroxysmal atrial fibrillation, advanced Parkinson disease, dementia, paroxysmal atrial fibrillation on apixaban, hypertension, CKD stage III, history of seizures who was diagnosed with COVID-19 infection on 09/10/2019. Presented from nursing home with 2-day history of reduced responsiveness and poor p.o. intake. Patient gets most of his nutrition and fluid through the feeding tube. There is no history of nausea vomiting or diarrhea. He does have history of frequent UTIs. Patient has been less verbal as per patient's wife. Patient usually not able to carry conversation at baseline. In the ED patient was found to have UTI, and was going to be discharged home. Patient became hypotensive in the ED, he was given Rocephin and IV fluid boluses.Currently he is requiring 4 L/min of oxygen.He was unresponsive and unable to provide any history.   -Patient was discharged to [facility] on [id number] however [facility] was unable to  pick patient up until 09/21/2019  Assessment & Plan: 1-acute resp failure with hypoxia due to COVID PNA -requiring non-rebreather initially -Hypoxia is resolved, successfully weaned off oxygen -Completed IV remdesivir   Completed  IV steroids, discharged on p.o. prednisone   2-severe sepsis with septic shock -due to E, coli UTI/bactermia -Per culture results microorganism is pansensitive. Treated with IV antibiotics including IV Rocephin, okay to discharge on Keflex via PEG tube to complete at least 10 days of antibiotic therapy  3 social/ethics --- discussed with Mark [name] from palliative care services, official palliative care consult appreciated, also discussed with patient's wife, okay to discharge to [facility] with palliative care following  4-chronic paroxysmal a. 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   6-Altered mental status/metabolic encephalopathy -patient with underlying hx of dementia and parkinson's -Patient is nonverbal, not really interactive, -Appears comfortable  7-parkinson's dementia -continue sinemet -patient meds and nutrition given through PEG  8-hx of seizure  continue keppra -no seizure appreciated.   9-DNR -patient DNR/DNI prior to admission -long discussion about prognosis and limited response so far with family -will continue to treat what is treatable -Palliative care consulted for  advance directives. -poor long-term prognosis   10-stage 3 sacrum pressure injury -continue preventive measures -Pressure injury was present prior to admission.   -Patient is bed-bound at baseline  11-diarrhea -flexiseal in place to protect skin integrity; patient with stage 3 sacrum pressure injury  already. -C. difficile negative; continue the use of Florastor. -patient on chronic tube feeding and on antibiotics. -Much improved   12)FEN--- Tube feeding Osmolite 1.5 at 60 ml/hr continuously, water flushes 300 mL every 6 hours and prostat 30 ml daily via PEG tube  13) low platelet count--- thrombocytopenia noted, patient on apixaban for stroke prophylaxis,, COVID-19 infection is associated with hypercoagulable state especially in the patient who is challenged from a mobility standpoint -Risk versus benefit of continue anticoagulation discussed with wife -At this time wife would like to continue anticoagulation none risk of bleeding exists -If any bleeding concerns apixaban will have to be discontinued -   DVT prophylaxis: on apixaban Code Status: DNR/DNI Family Communication: wife over the phone Disposition --- discharge to [facility]   completed IV sterodis, completed IV remdesivir and completed IV antibiotics.  Successfully weaned off Levophed ,  Lactic acidosis has resolved.  Continue repleting electrolytes and providing supportive care.  Long-term prognosis remains poor  Consultants:   Palliative care  intensivist curbside (Dr. Elsworth Soho) on 09/15/19; continue current management.   Procedures:   See below for x-ray reports.   Discharge Condition: Poor prognosis overall  Follow UP--outpatient palliative care follow-up advised possible transition to hospice if continues to decline  Lab Results  Component Value Date   PLT 63 (L) 09/20/2019    Inpatient Medications  Scheduled Meds: . apixaban  5 mg Per Tube BID  . atorvastatin  40 mg Per Tube Daily  . carbidopa-levodopa  1 tablet Per Tube TID  . chlorhexidine  15 mL Mouth Rinse BID  . Chlorhexidine Gluconate Cloth  6 each Topical Daily  . dexamethasone (DECADRON) injection  6 mg Intravenous Q24H  . donepezil  5 mg Per Tube QHS  . DULoxetine  20 mg Oral BID  . feeding supplement (PRO-STAT SUGAR FREE 64)  30 mL Per  Tube Daily  . free water  300 mL Per Tube Q6H  . gabapentin  100 mg Per Tube TID  . insulin aspart  0-9 Units Subcutaneous Q4H  . insulin detemir  7 Units Subcutaneous Daily  . levETIRAcetam  500 mg Per Tube BID  . mouth rinse  15 mL Mouth Rinse q12n4p  . metoprolol tartrate  12.5 mg Oral BID  . QUEtiapine  50 mg Per Tube QHS  . saccharomyces boulardii  250 mg Per Tube BID  . scopolamine  1 patch Transdermal Q72H   Continuous Infusions: . cefTRIAXone (ROCEPHIN)  IV Stopped (09/21/19 0851)  . dextrose 5 % with KCl 20 mEq / L Stopped (09/21/19 1109)  . feeding supplement (OSMOLITE 1.5 CAL) 1,000 mL (09/20/19 2018)   PRN Meds:.acetaminophen, ondansetron **OR** ondansetron (ZOFRAN) IV    Anti-infectives (From admission, onward)   Start     Dose/Rate Route Frequency Ordered Stop   09/20/19 0000  cephALEXin (KEFLEX) 500 MG capsule     500 mg Oral 3 times daily 09/20/19 1521 09/25/19 2359   09/18/19 0900  cefTRIAXone (ROCEPHIN) 2 g in sodium chloride 0.9 % 100 mL IVPB     2 g 200 mL/hr over 30 Minutes Intravenous Every 24 hours 09/18/19 0855     09/18/19 0845  meropenem (MERREM) 1 g in sodium chloride 0.9 %  100 mL IVPB  Status:  Discontinued     1 g 200 mL/hr over 30 Minutes Intravenous Every 8 hours 09/18/19 0830 09/18/19 0846   09/16/19 1000  remdesivir 100 mg in sodium chloride 0.9 % 100 mL IVPB  Status:  Discontinued     100 mg 200 mL/hr over 30 Minutes Intravenous Daily 09/15/19 0054 09/15/19 0059   09/16/19 1000  remdesivir 100 mg in sodium chloride 0.9 % 100 mL IVPB     100 mg 200 mL/hr over 30 Minutes Intravenous Daily 09/15/19 0022 09/19/19 1049   09/15/19 2200  cefTRIAXone (ROCEPHIN) 1 g in sodium chloride 0.9 % 100 mL IVPB  Status:  Discontinued     1 g 200 mL/hr over 30 Minutes Intravenous Every 24 hours 09/15/19 0054 09/15/19 1010   09/15/19 1100  meropenem (MERREM) 1 g in sodium chloride 0.9 % 100 mL IVPB  Status:  Discontinued     1 g 200 mL/hr over 30 Minutes  Intravenous Every 12 hours 09/15/19 1049 09/18/19 0830   09/15/19 0100  remdesivir 200 mg in sodium chloride 0.9% 250 mL IVPB     200 mg 580 mL/hr over 30 Minutes Intravenous Once 09/15/19 0022 09/15/19 0245   09/15/19 0054  remdesivir 200 mg in sodium chloride 0.9% 250 mL IVPB  Status:  Discontinued     200 mg 580 mL/hr over 30 Minutes Intravenous Once 09/15/19 0054 09/15/19 0059   09/14/19 1900  cefTRIAXone (ROCEPHIN) 1 g in sodium chloride 0.9 % 100 mL IVPB     1 g 200 mL/hr over 30 Minutes Intravenous  Once 09/14/19 1858 09/14/19 2142        Objective:   Vitals:   09/21/19 0400 09/21/19 0817 09/21/19 1045 09/21/19 1100  BP: 116/73 128/78  124/71  Pulse: 87 95 79 83  Resp: 20  20 19   Temp: 97.8 F (36.6 C)     TempSrc: Axillary     SpO2: 100%  100% 100%  Weight:      Height:        Wt Readings from Last 3 Encounters:  09/19/19 113.3 kg  11/13/18 52.6 kg  06/29/18 116.6 kg     Intake/Output Summary (Last 24 hours) at 09/21/2019 1214 Last data filed at 09/21/2019 1153 Gross per 24 hour  Intake 9714.02 ml  Output 2025 ml  Net 7689.02 ml     Physical Exam  Gen:- Awake ,, nonverbal, no acute distress  HEENT:- [place].AT, No sclera icterus Neck-Supple Neck,No JVD,.  Lungs-no wheezing, air movement is fair CV- S1, S2 normal, irregular Abd-  +ve B.Sounds, Abd Soft, No tenderness, increased truncal adiposity, PEG tube in situ  extremity-Right hand/wrist with a splint, good pulses Psych-affect is appropriate, oriented x3 Neuro-generalized weakness, no purposeful movement, not verbal, unable to assess mood and cognitive function as patient is really not interactive at this time -Skin--stage III sacral decub--POA, does not look infected   Data Review:   Micro Results Recent Results (from the past 240 hour(s))  Urine culture     Status: Abnormal   Collection Time: 09/14/19  5:35 PM   Specimen: Urine, Random  Result Value Ref Range Status   Specimen Description   Final     URINE, RANDOM Performed at [facility], [address], [place], [place] [address]    Special Requests   Final    NONE Performed at [facility], [address], [place], [place] [address]    Culture >=100,000 COLONIES/mL ESCHERICHIA COLI (A)  Final   Report Status 09/17/2019 FINAL  Final   Organism ID, Bacteria ESCHERICHIA COLI (A)  Final      Susceptibility   Escherichia coli - MIC*    AMPICILLIN <=2 SENSITIVE Sensitive     CEFAZOLIN <=4 SENSITIVE Sensitive     CEFTRIAXONE <=0.25 SENSITIVE Sensitive     CIPROFLOXACIN <=0.25 SENSITIVE Sensitive     GENTAMICIN <=1 SENSITIVE Sensitive     IMIPENEM <=0.25 SENSITIVE Sensitive     NITROFURANTOIN <=16 SENSITIVE Sensitive     TRIMETH/SULFA <=20 SENSITIVE Sensitive     AMPICILLIN/SULBACTAM <=2 SENSITIVE Sensitive     PIP/TAZO <=4 SENSITIVE Sensitive     * >=100,000 COLONIES/mL ESCHERICHIA COLI  Culture, blood (routine x 2)     Status: Abnormal   Collection Time: 09/14/19  8:48 PM   Specimen: BLOOD RIGHT ARM  Result Value Ref Range Status   Specimen Description   Final    BLOOD RIGHT ARM Performed at [facility], [address], [place], [place] [address]    Special Requests   Final    BOTTLES DRAWN AEROBIC AND ANAEROBIC Blood Culture adequate volume Performed at [place] [place], [place] [address]    Culture  Setup Time   Final    GRAM NEGATIVE RODS IN BOTH AEROBIC AND ANAEROBIC BOTTLES Gram Stain Report Called to,Read Back By and Verified With: MARTIN D. AT 0950A ON [id number] BY THOMPSON S. CRITICAL RESULT CALLED TO, READ BACK BY AND VERIFIED WITH: G. Coffee PharmD 15:35 09/15/19 (wilsonm) Performed at [facility], [place] [address], [place], [place] [address]    Culture ESCHERICHIA COLI (A)  Final   Report Status 09/18/2019 FINAL  Final   Organism ID, Bacteria ESCHERICHIA COLI  Final      Susceptibility   Escherichia coli - MIC*    AMPICILLIN <=2 SENSITIVE Sensitive     CEFAZOLIN <=4 SENSITIVE Sensitive      CEFEPIME <=0.12 SENSITIVE Sensitive     CEFTAZIDIME <=1 SENSITIVE Sensitive     CEFTRIAXONE <=0.25 SENSITIVE Sensitive     CIPROFLOXACIN <=0.25 SENSITIVE Sensitive     GENTAMICIN <=1 SENSITIVE Sensitive     IMIPENEM <=0.25 SENSITIVE Sensitive     TRIMETH/SULFA <=20 SENSITIVE Sensitive     AMPICILLIN/SULBACTAM <=2 SENSITIVE Sensitive     PIP/TAZO <=4 SENSITIVE Sensitive     * ESCHERICHIA COLI  Blood Culture ID Panel (Reflexed)     Status: Abnormal   Collection Time: 09/14/19  8:48 PM  Result Value Ref Range Status   Enterococcus species NOT DETECTED NOT DETECTED Final   Listeria monocytogenes NOT DETECTED NOT DETECTED Final   Staphylococcus species NOT DETECTED NOT DETECTED Final   Staphylococcus aureus (BCID) NOT DETECTED NOT DETECTED Final   Streptococcus species NOT DETECTED NOT DETECTED Final   Streptococcus agalactiae NOT DETECTED NOT DETECTED Final   Streptococcus pneumoniae NOT DETECTED NOT DETECTED Final   Streptococcus pyogenes NOT DETECTED NOT DETECTED Final   Acinetobacter baumannii NOT DETECTED NOT DETECTED Final   Enterobacteriaceae species DETECTED (A) NOT DETECTED Final    Comment: Enterobacteriaceae represent a large family of gram-negative bacteria, not a single organism. CRITICAL RESULT CALLED TO, READ BACK BY AND VERIFIED WITH: G. Coffee PharmD 15:35 09/15/19 (wilsonm)    Enterobacter cloacae complex NOT DETECTED NOT DETECTED Final   Escherichia coli DETECTED (A) NOT DETECTED Final    Comment: CRITICAL RESULT CALLED TO, READ BACK BY AND VERIFIED WITH: G. Coffee PharmD 15:35 09/15/19 (wilsonm)  Klebsiella oxytoca NOT DETECTED NOT DETECTED Final   Klebsiella pneumoniae NOT DETECTED NOT DETECTED Final   Proteus species NOT DETECTED NOT DETECTED Final   Serratia marcescens NOT DETECTED NOT DETECTED Final   Carbapenem resistance NOT DETECTED NOT DETECTED Final   Haemophilus influenzae NOT DETECTED NOT DETECTED Final   Neisseria meningitidis NOT DETECTED NOT  DETECTED Final   Pseudomonas aeruginosa NOT DETECTED NOT DETECTED Final   Candida albicans NOT DETECTED NOT DETECTED Final   Candida glabrata NOT DETECTED NOT DETECTED Final   Candida krusei NOT DETECTED NOT DETECTED Final   Candida parapsilosis NOT DETECTED NOT DETECTED Final   Candida tropicalis NOT DETECTED NOT DETECTED Final    Comment: Performed at [facility], [place] [address], [place], [place] [address]  Respiratory Panel by RT PCR (Flu A&B, Covid) - Nasopharyngeal Swab     Status: Abnormal   Collection Time: 09/15/19  2:30 AM   Specimen: Nasopharyngeal Swab  Result Value Ref Range Status   SARS Coronavirus 2 by RT PCR POSITIVE (A) NEGATIVE Final    Comment: CRITICAL RESULT CALLED TO, READ BACK BY AND VERIFIED WITH: L ANDREWS,RN @0437  09/15/19 MKELLY (NOTE) SARS-CoV-2 target nucleic acids are DETECTED. SARS-CoV-2 RNA is generally detectable in upper respiratory specimens  during the acute phase of infection. Positive results are indicative of the presence of the identified virus, but do not rule out bacterial infection or co-infection with other pathogens not detected by the test. Clinical correlation with patient history and other diagnostic information is necessary to determine patient infection status. The expected result is Negative. Fact Sheet for Patients:  PinkCheek.be Fact Sheet for Healthcare Providers: GravelBags.it This test is not yet approved or cleared by the [place] FDA and  has been authorized for detection and/or diagnosis of SARS-CoV-2 by FDA under an Emergency Use Authorization (EUA).  This EUA will remain in effect (meaning this test can be  used) for the duration of  the COVID-19 declaration under Section 564(b)(1) of the Act, 21 U.S.C. section 360bbb-3(b)(1), unless the authorization is terminated or revoked sooner.    Influenza A by PCR NEGATIVE NEGATIVE Final   Influenza B by PCR  NEGATIVE NEGATIVE Final    Comment: (NOTE) The Xpert Xpress SARS-CoV-2/FLU/RSV assay is intended as an aid in  the diagnosis of influenza from Nasopharyngeal swab specimens and  should not be used as a sole basis for treatment. Nasal washings and  aspirates are unacceptable for Xpert Xpress SARS-CoV-2/FLU/RSV  testing. Fact Sheet for Patients: PinkCheek.be Fact Sheet for Healthcare Providers: GravelBags.it This test is not yet approved or cleared by the [place] FDA and  has been authorized for detection and/or diagnosis of SARS-CoV-2 by  FDA under an Emergency Use Authorization (EUA). This EUA will remain  in effect (meaning this test can be used) for the duration of the  Covid-19 declaration under Section 564(b)(1) of the Act, 21  U.S.C. section 360bbb-3(b)(1), unless the authorization is  terminated or revoked. Performed at [facility], [address] [place], [place] [address]   Culture, blood (routine x 2)     Status: None   Collection Time: 09/15/19  3:50 AM   Specimen: BLOOD  Result Value Ref Range Status   Specimen Description BLOOD RIGHT ANTECUBITAL  Final   Special Requests   Final    BOTTLES DRAWN AEROBIC AND ANAEROBIC Blood Culture adequate volume   Culture   Final    NO GROWTH 5 DAYS Performed at [facility], [address] [place] [address] [place], [place] [address]    Report Status 09/20/2019 FINAL  Final  MRSA PCR Screening     Status: None   Collection Time: 09/16/19  7:03 AM   Specimen: Nasopharyngeal  Result Value Ref Range Status   MRSA by PCR NEGATIVE NEGATIVE Final    Comment:        The GeneXpert MRSA Assay (FDA approved for NASAL specimens only), is one component of a comprehensive MRSA colonization surveillance program. It is not intended to diagnose MRSA infection nor to guide or monitor treatment for MRSA infections. Performed at [facility], [address] [place], [place] [address]   C  difficile quick scan w PCR reflex     Status: None   Collection Time: 09/17/19 10:08 AM   Specimen: STOOL  Result Value Ref Range Status   C Diff antigen NEGATIVE NEGATIVE Final   C Diff toxin NEGATIVE NEGATIVE Final   C Diff interpretation No C. difficile detected.  Final    Comment: Performed at [facility], [address] [place], [place] [address]    Radiology Reports DG Chest Portable 1 View  Result Date: 09/14/2019 CLINICAL DATA:  Altered mental status history of COVID positive EXAM: PORTABLE CHEST 1 VIEW COMPARISON:  06/25/2018 FINDINGS: Low lung volumes. Small foci of airspace disease in the right mid lung and left base. Mild cardiomegaly with aortic atherosclerosis. No pneumothorax. IMPRESSION: Low lung volumes. Small foci of airspace disease at the left base and right mid lung, could reflect mild pneumonia. Electronically Signed   By: Donavan Foil M.D.   On: 09/14/2019 16:29   DG Finger Index Right  Result Date: 08/29/2019 X-rays right index finger finger injury 2 weeks ago new films to assist in treatment decision We had a proximal phalanx fracture and also an intra-articular fracture of the PIP joint on the prior film X-rays again confirmed both injuries the proximal phalanx fracture appears to be healing well the intra-articular fracture shows joint depression with comminution Impression PIP joint fracture with comminution and joint depression Proximal phalanx fracture healing  [place] EKG SITE RITE  Result Date: 09/16/2019 If Site Rite image not attached, placement could not be confirmed due to current cardiac rhythm.    CBC Recent Labs  Lab 09/15/19 0350 09/15/19 0350 09/16/19 0842 09/17/19 0429 09/18/19 0420 09/19/19 0355 09/20/19 0411  WBC 4.7   < > 36.0* 31.3* 19.0* 12.5* 10.1  HGB 11.9*   < > 13.8 12.4* 11.2* 10.2* 10.2*  HCT 39.0   < > 42.7 38.8* 34.5* 31.6* 33.1*  PLT 170   < > 108* 88* 64* 52* 63*  MCV 97.5   < > 94.9 94.6 94.8 95.8 96.2  MCH 29.8   < > 30.7  30.2 30.8 30.9 29.7  MCHC 30.5   < > 32.3 32.0 32.5 32.3 30.8  RDW 14.9   < > 15.3 15.4 15.6* 15.9* 15.9*  LYMPHSABS 0.3*  --  1.1 0.9 1.0 1.0  --   MONOABS 0.0*  --  1.3* 0.8 1.0 0.7  --   EOSABS 0.0  --  0.2 0.4 0.0 0.0  --   BASOSABS 0.0  --  0.0 0.1 0.0 0.0  --    < > = values in this interval not displayed.    Chemistries  Recent Labs  Lab 09/15/19 0350 09/16/19 0842 09/17/19 0429 09/18/19 0420 09/19/19 0355  NA 145 146* 146* 148* 145  K 2.6* 3.5 3.0* 2.8* 3.7  CL 112* 115* 113*  116* 116*  CO2 19* 19* 19* 22 22  GLUCOSE 115* 128* 242* 202* 278*  BUN 27* 53* 57* 62* 61*  CREATININE 2.29* 2.34* 1.95* 1.55* 1.33*  CALCIUM 7.4* 7.3* 7.6* 8.0* 8.1*  MG 1.5*  --   --   --   --   AST 48* 75* 66* 62* 112*  ALT 21 24 22 19  53*  ALKPHOS 97 100 95 102 121  BILITOT 1.7* 1.0 0.7 0.5 0.5   ------------------------------------------------------------------------------------------------------------------ No results for input(s): CHOL, HDL, LDLCALC, TRIG, CHOLHDL, LDLDIRECT in the last 72 hours.  Lab Results  Component Value Date   HGBA1C 5.5 09/18/2019   ------------------------------------------------------------------------------------------------------------------ No results for input(s): TSH, T4TOTAL, T3FREE, THYROIDAB in the last 72 hours.  Invalid input(s): FREET3 ------------------------------------------------------------------------------------------------------------------ Recent Labs    09/19/19 0355  FERRITIN 686*    Coagulation profile No results for input(s): INR, PROTIME in the last 168 hours.  Recent Labs    09/19/19 0355  DDIMER 0.73*    Cardiac Enzymes No results for input(s): CKMB, TROPONINI, MYOGLOBIN in the last 168 hours.  Invalid input(s): CK ------------------------------------------------------------------------------------------------------------------    Component Value Date/Time   BNP 93.0 11/13/2018 1029   -Patient was discharged  to [facility] on [id number] however [facility] was unable to pick patient up until 09/21/2019    Roxan Hockey M.D on 09/21/2019 at 12:14 PM  Go to [email or web address] - for contact info  [facility] - Office  [phone]

## 2019-10-09 DEATH — deceased

## 2020-05-16 IMAGING — DX DG FINGER INDEX 2+V*R*
3 series · 3 of 3 positions shown · non-contrast
Comparison: December 19, 2011.

CLINICAL DATA: Right index finger pain.

EXAM:
RIGHT INDEX FINGER 2+V

[finger ap]
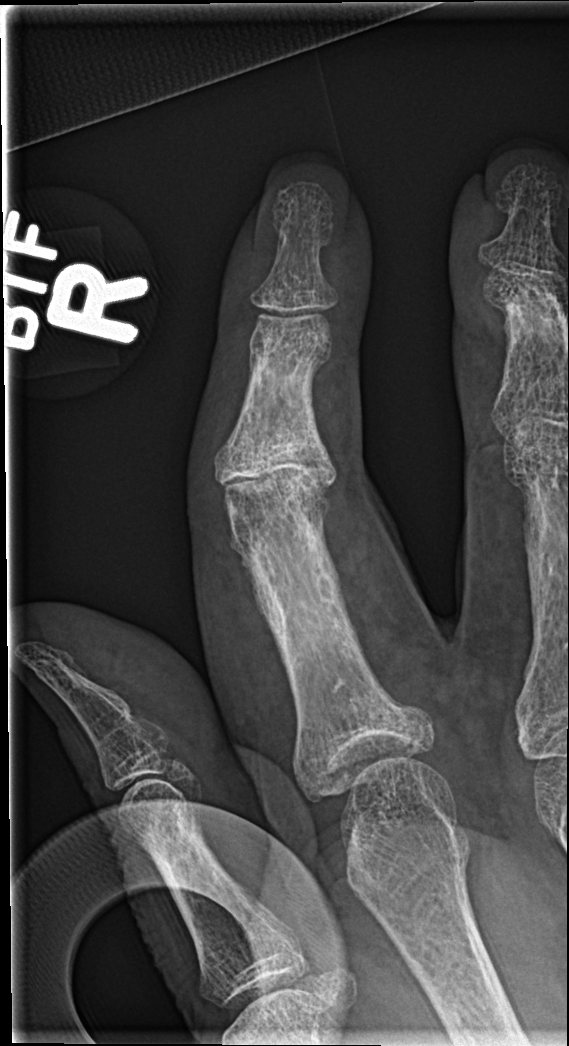

[finger lat]
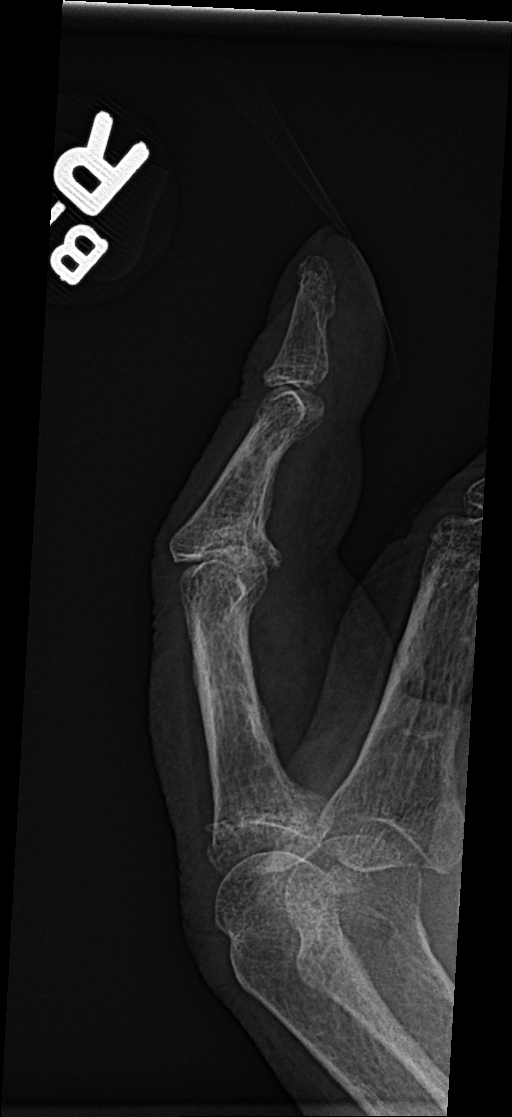

[finger obl]
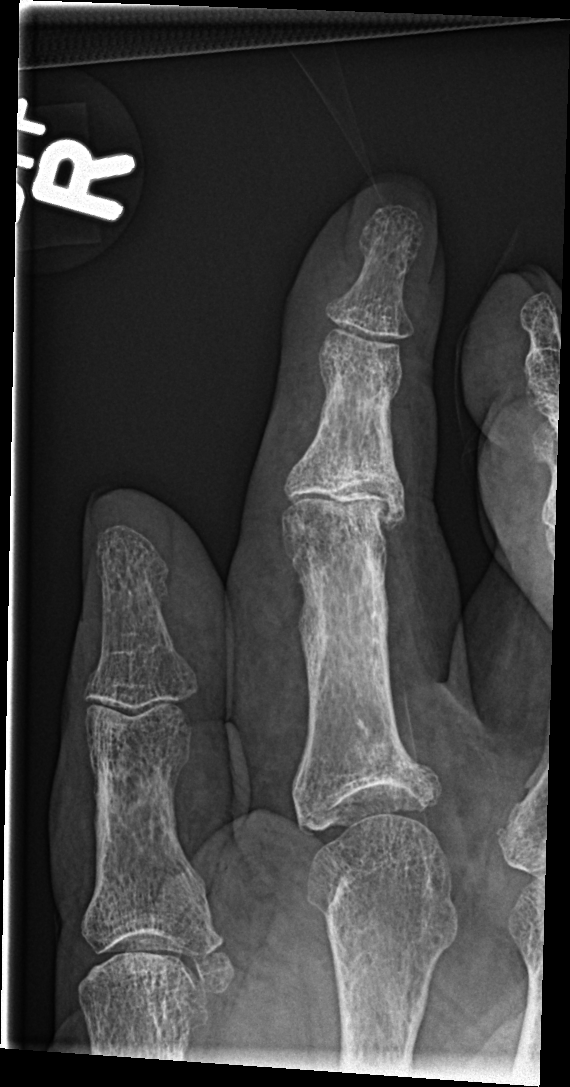

[3 of 3 positions shown; findings below may reference images not displayed]

FINDINGS: Minimally displaced fracture is seen involving the proximal base of
the first proximal phalanx. Degenerative changes seen involving the
proximal interphalangeal joint of the index finger. No soft tissue
abnormality is noted.
IMPRESSION: Minimally displaced fracture is seen involving proximal base of
first proximal phalanx.

## 2020-06-08 IMAGING — DX DG CHEST 1V PORT
1 series · 1 of 1 positions shown · non-contrast
Comparison: 06/25/2018

CLINICAL DATA: Altered mental status history of COVID positive

EXAM:
PORTABLE CHEST 1 VIEW

[chest ap]
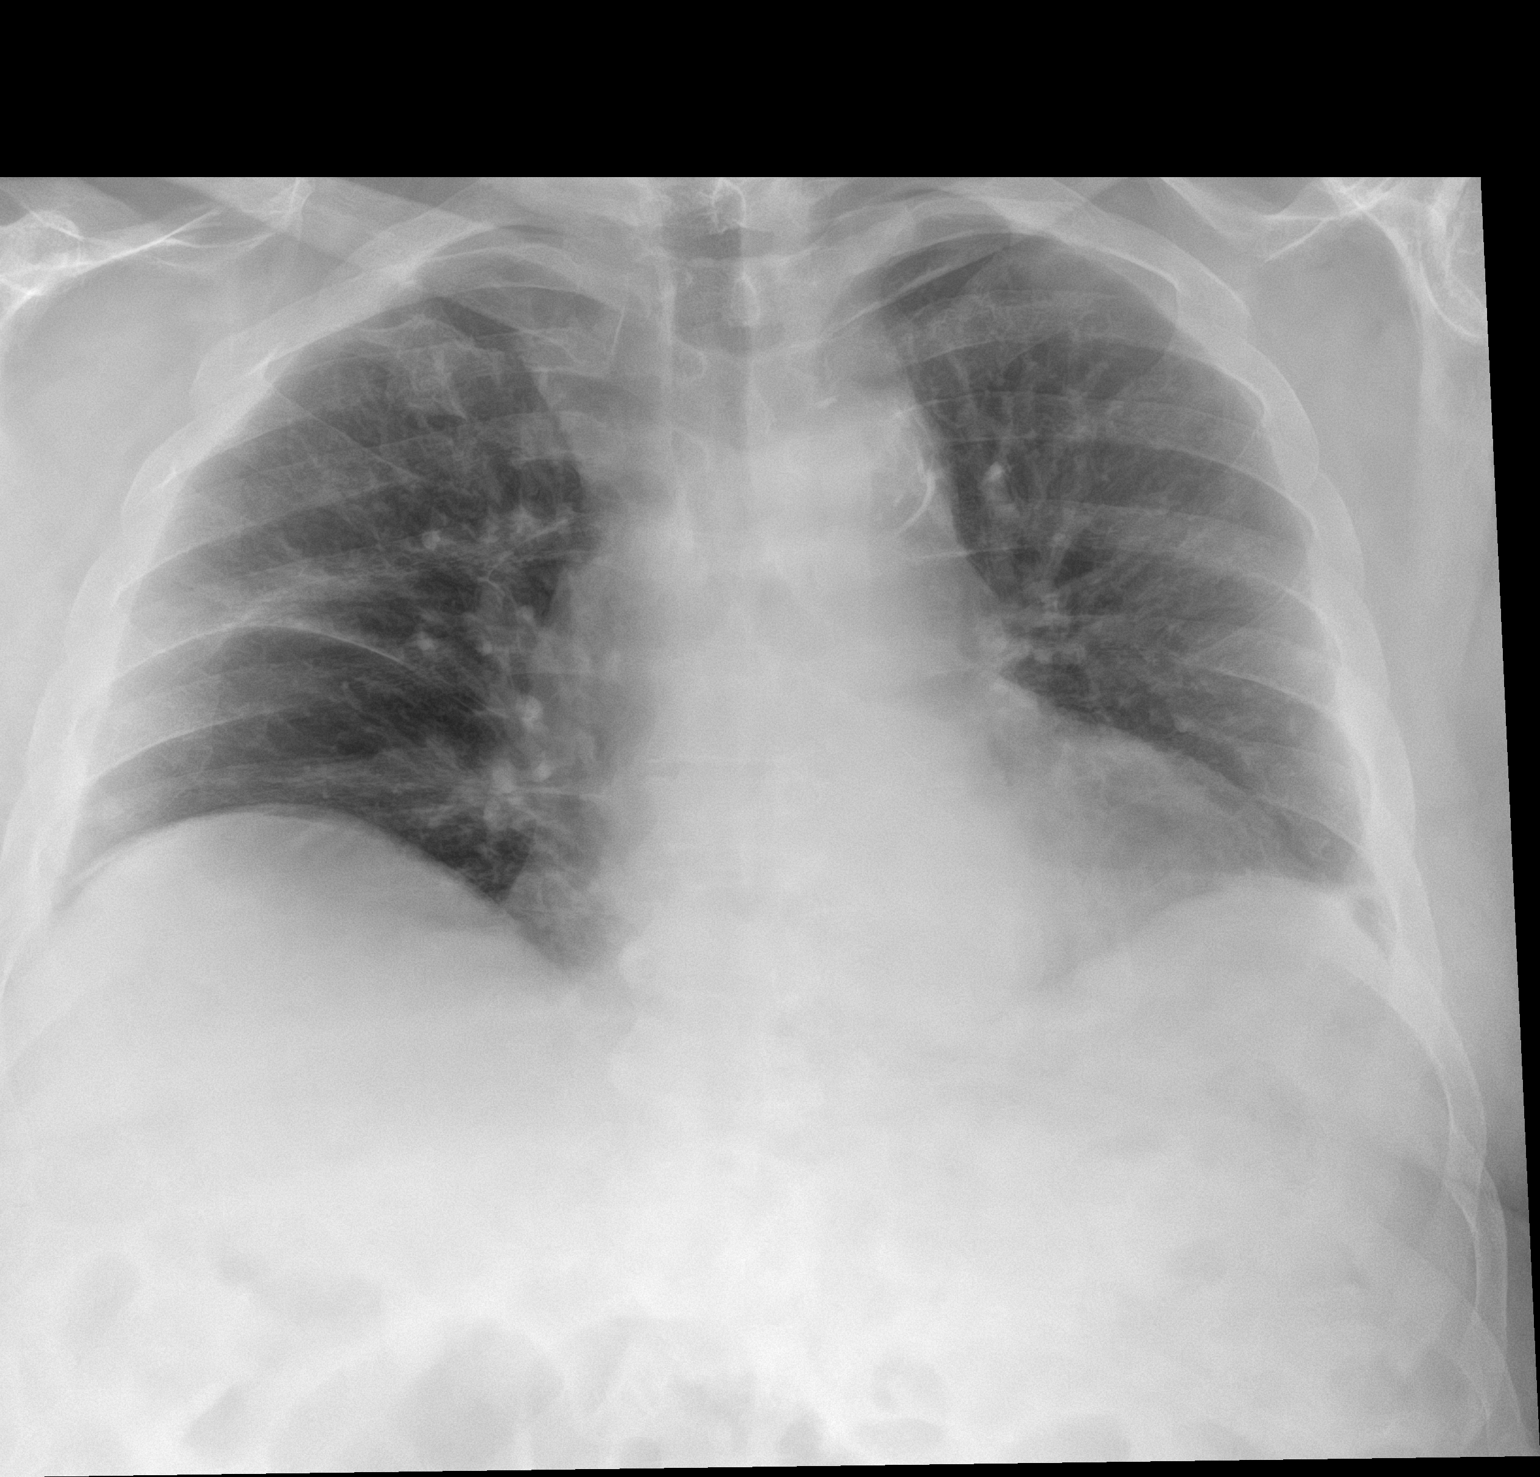

[1 of 1 positions shown; findings below may reference images not displayed]

FINDINGS: Low lung volumes. Small foci of airspace disease in the right mid
lung and left base. Mild cardiomegaly with aortic atherosclerosis.
No pneumothorax.
IMPRESSION: Low lung volumes. Small foci of airspace disease at the left base
and right mid lung, could reflect mild pneumonia.
